# Patient Record
Sex: Male | Born: 1965 | Race: Black or African American | Hispanic: No | Marital: Single | Smoking: Never smoker
Health system: Southern US, Community
[De-identification: ages and names within clinical notes are randomized; demographics above are authoritative.]

## PROBLEM LIST (undated history)

## (undated) DIAGNOSIS — T7840XA Allergy, unspecified, initial encounter: Secondary | ICD-10-CM

## (undated) DIAGNOSIS — J45909 Unspecified asthma, uncomplicated: Secondary | ICD-10-CM

## (undated) DIAGNOSIS — I509 Heart failure, unspecified: Secondary | ICD-10-CM

## (undated) DIAGNOSIS — R571 Hypovolemic shock: Secondary | ICD-10-CM

## (undated) DIAGNOSIS — G4730 Sleep apnea, unspecified: Secondary | ICD-10-CM

## (undated) DIAGNOSIS — C801 Malignant (primary) neoplasm, unspecified: Secondary | ICD-10-CM

## (undated) DIAGNOSIS — D869 Sarcoidosis, unspecified: Secondary | ICD-10-CM

## (undated) DIAGNOSIS — E785 Hyperlipidemia, unspecified: Secondary | ICD-10-CM

## (undated) DIAGNOSIS — K219 Gastro-esophageal reflux disease without esophagitis: Secondary | ICD-10-CM

## (undated) DIAGNOSIS — K612 Anorectal abscess: Secondary | ICD-10-CM

## (undated) DIAGNOSIS — R159 Full incontinence of feces: Secondary | ICD-10-CM

## (undated) DIAGNOSIS — F79 Unspecified intellectual disabilities: Secondary | ICD-10-CM

## (undated) DIAGNOSIS — F209 Schizophrenia, unspecified: Secondary | ICD-10-CM

## (undated) DIAGNOSIS — R161 Splenomegaly, not elsewhere classified: Secondary | ICD-10-CM

## (undated) DIAGNOSIS — H269 Unspecified cataract: Secondary | ICD-10-CM

## (undated) DIAGNOSIS — K592 Neurogenic bowel, not elsewhere classified: Secondary | ICD-10-CM

## (undated) DIAGNOSIS — R6889 Other general symptoms and signs: Secondary | ICD-10-CM

## (undated) DIAGNOSIS — J439 Emphysema, unspecified: Secondary | ICD-10-CM

## (undated) DIAGNOSIS — K599 Functional intestinal disorder, unspecified: Secondary | ICD-10-CM

## (undated) DIAGNOSIS — K56609 Unspecified intestinal obstruction, unspecified as to partial versus complete obstruction: Secondary | ICD-10-CM

## (undated) HISTORY — DX: Allergy, unspecified, initial encounter: T78.40XA

## (undated) HISTORY — DX: Anorectal abscess: K61.2

## (undated) HISTORY — DX: Sarcoidosis, unspecified: D86.9

## (undated) HISTORY — PX: MOUTH SURGERY: SHX715

## (undated) HISTORY — DX: Full incontinence of feces: R15.9

## (undated) HISTORY — DX: Unspecified cataract: H26.9

## (undated) HISTORY — PX: OTHER SURGICAL HISTORY: SHX169

## (undated) HISTORY — DX: Gastro-esophageal reflux disease without esophagitis: K21.9

## (undated) HISTORY — DX: Functional intestinal disorder, unspecified: K59.9

## (undated) HISTORY — DX: Heart failure, unspecified: I50.9

## (undated) HISTORY — DX: Other general symptoms and signs: R68.89

## (undated) HISTORY — DX: Malignant (primary) neoplasm, unspecified: C80.1

## (undated) HISTORY — DX: Hyperlipidemia, unspecified: E78.5

## (undated) HISTORY — DX: Sleep apnea, unspecified: G47.30

## (undated) HISTORY — DX: Hypovolemic shock: R57.1

## (undated) HISTORY — DX: Splenomegaly, not elsewhere classified: R16.1

## (undated) HISTORY — DX: Unspecified asthma, uncomplicated: J45.909

## (undated) HISTORY — DX: Unspecified intellectual disabilities: F79

## (undated) HISTORY — DX: Schizophrenia, unspecified: F20.9

## (undated) HISTORY — DX: Emphysema, unspecified: J43.9

---

## 1998-04-09 ENCOUNTER — Encounter: Admission: RE | Admit: 1998-04-09 | Discharge: 1998-04-09 | Payer: Self-pay | Admitting: Internal Medicine

## 1998-09-29 ENCOUNTER — Encounter: Admission: RE | Admit: 1998-09-29 | Discharge: 1998-09-29 | Payer: Self-pay | Admitting: Internal Medicine

## 1998-10-27 ENCOUNTER — Encounter: Admission: RE | Admit: 1998-10-27 | Discharge: 1998-10-27 | Payer: Self-pay | Admitting: Internal Medicine

## 1998-11-02 ENCOUNTER — Encounter: Admission: RE | Admit: 1998-11-02 | Discharge: 1998-11-02 | Payer: Self-pay | Admitting: Internal Medicine

## 1999-09-01 ENCOUNTER — Encounter: Admission: RE | Admit: 1999-09-01 | Discharge: 1999-09-01 | Payer: Self-pay | Admitting: Internal Medicine

## 2000-09-24 ENCOUNTER — Encounter: Admission: RE | Admit: 2000-09-24 | Discharge: 2000-09-24 | Payer: Self-pay | Admitting: Internal Medicine

## 2001-11-11 ENCOUNTER — Encounter: Admission: RE | Admit: 2001-11-11 | Discharge: 2001-11-11 | Payer: Self-pay | Admitting: Internal Medicine

## 2001-12-20 ENCOUNTER — Encounter: Payer: Self-pay | Admitting: Urology

## 2001-12-21 ENCOUNTER — Inpatient Hospital Stay (HOSPITAL_COMMUNITY): Admission: EM | Admit: 2001-12-21 | Discharge: 2001-12-26 | Payer: Self-pay | Admitting: *Deleted

## 2001-12-23 ENCOUNTER — Encounter: Payer: Self-pay | Admitting: Urology

## 2002-10-23 ENCOUNTER — Encounter: Admission: RE | Admit: 2002-10-23 | Discharge: 2002-10-23 | Payer: Self-pay | Admitting: Internal Medicine

## 2002-12-18 ENCOUNTER — Encounter: Admission: RE | Admit: 2002-12-18 | Discharge: 2002-12-18 | Payer: Self-pay | Admitting: Internal Medicine

## 2003-01-02 ENCOUNTER — Encounter: Payer: Self-pay | Admitting: Cardiology

## 2003-01-02 ENCOUNTER — Ambulatory Visit (HOSPITAL_COMMUNITY): Admission: RE | Admit: 2003-01-02 | Discharge: 2003-01-02 | Payer: Self-pay | Admitting: Internal Medicine

## 2003-01-22 ENCOUNTER — Encounter: Admission: RE | Admit: 2003-01-22 | Discharge: 2003-01-22 | Payer: Self-pay | Admitting: Internal Medicine

## 2003-03-20 ENCOUNTER — Emergency Department (HOSPITAL_COMMUNITY): Admission: EM | Admit: 2003-03-20 | Discharge: 2003-03-20 | Payer: Self-pay | Admitting: *Deleted

## 2003-09-21 ENCOUNTER — Encounter: Admission: RE | Admit: 2003-09-21 | Discharge: 2003-09-21 | Payer: Self-pay | Admitting: Internal Medicine

## 2004-08-02 ENCOUNTER — Ambulatory Visit: Payer: Self-pay | Admitting: Internal Medicine

## 2004-11-25 ENCOUNTER — Ambulatory Visit: Payer: Self-pay | Admitting: Internal Medicine

## 2005-05-18 ENCOUNTER — Ambulatory Visit: Payer: Self-pay | Admitting: Internal Medicine

## 2005-07-31 HISTORY — PX: COLONOSCOPY: SHX174

## 2005-09-15 ENCOUNTER — Ambulatory Visit: Payer: Self-pay | Admitting: Internal Medicine

## 2005-12-14 ENCOUNTER — Ambulatory Visit: Payer: Self-pay | Admitting: Internal Medicine

## 2005-12-20 ENCOUNTER — Inpatient Hospital Stay (HOSPITAL_COMMUNITY): Admission: EM | Admit: 2005-12-20 | Discharge: 2006-01-01 | Payer: Self-pay | Admitting: Emergency Medicine

## 2005-12-20 ENCOUNTER — Ambulatory Visit: Payer: Self-pay | Admitting: Internal Medicine

## 2005-12-23 ENCOUNTER — Encounter (INDEPENDENT_AMBULATORY_CARE_PROVIDER_SITE_OTHER): Payer: Self-pay | Admitting: *Deleted

## 2005-12-26 ENCOUNTER — Ambulatory Visit: Payer: Self-pay | Admitting: Internal Medicine

## 2006-01-01 ENCOUNTER — Encounter (INDEPENDENT_AMBULATORY_CARE_PROVIDER_SITE_OTHER): Payer: Self-pay | Admitting: *Deleted

## 2006-01-01 ENCOUNTER — Encounter: Payer: Self-pay | Admitting: Internal Medicine

## 2006-01-01 LAB — HM COLONOSCOPY

## 2006-01-02 ENCOUNTER — Emergency Department (HOSPITAL_COMMUNITY): Admission: EM | Admit: 2006-01-02 | Discharge: 2006-01-02 | Payer: Self-pay | Admitting: Emergency Medicine

## 2006-01-11 ENCOUNTER — Ambulatory Visit: Payer: Self-pay | Admitting: Internal Medicine

## 2006-02-13 ENCOUNTER — Ambulatory Visit: Payer: Self-pay | Admitting: Internal Medicine

## 2006-03-23 ENCOUNTER — Encounter (INDEPENDENT_AMBULATORY_CARE_PROVIDER_SITE_OTHER): Payer: Self-pay | Admitting: *Deleted

## 2006-03-23 ENCOUNTER — Ambulatory Visit (HOSPITAL_COMMUNITY): Admission: RE | Admit: 2006-03-23 | Discharge: 2006-03-23 | Payer: Self-pay | Admitting: Internal Medicine

## 2006-04-09 ENCOUNTER — Ambulatory Visit: Payer: Self-pay | Admitting: Internal Medicine

## 2006-05-10 ENCOUNTER — Encounter (INDEPENDENT_AMBULATORY_CARE_PROVIDER_SITE_OTHER): Payer: Self-pay | Admitting: Infectious Diseases

## 2006-05-25 ENCOUNTER — Ambulatory Visit: Payer: Self-pay | Admitting: Internal Medicine

## 2006-05-25 ENCOUNTER — Encounter (INDEPENDENT_AMBULATORY_CARE_PROVIDER_SITE_OTHER): Payer: Self-pay | Admitting: Infectious Diseases

## 2006-06-11 DIAGNOSIS — F79 Unspecified intellectual disabilities: Secondary | ICD-10-CM

## 2006-06-11 DIAGNOSIS — I5022 Chronic systolic (congestive) heart failure: Secondary | ICD-10-CM

## 2006-06-11 DIAGNOSIS — F209 Schizophrenia, unspecified: Secondary | ICD-10-CM | POA: Insufficient documentation

## 2006-06-11 DIAGNOSIS — R161 Splenomegaly, not elsewhere classified: Secondary | ICD-10-CM

## 2006-06-11 DIAGNOSIS — D869 Sarcoidosis, unspecified: Secondary | ICD-10-CM

## 2006-06-11 DIAGNOSIS — K592 Neurogenic bowel, not elsewhere classified: Secondary | ICD-10-CM | POA: Insufficient documentation

## 2006-06-11 HISTORY — DX: Neurogenic bowel, not elsewhere classified: K59.2

## 2006-06-25 ENCOUNTER — Encounter: Admission: RE | Admit: 2006-06-25 | Discharge: 2006-09-23 | Payer: Self-pay | Admitting: Internal Medicine

## 2006-08-18 DIAGNOSIS — K612 Anorectal abscess: Secondary | ICD-10-CM

## 2006-08-18 DIAGNOSIS — G4733 Obstructive sleep apnea (adult) (pediatric): Secondary | ICD-10-CM

## 2006-08-18 DIAGNOSIS — I10 Essential (primary) hypertension: Secondary | ICD-10-CM | POA: Insufficient documentation

## 2006-08-18 HISTORY — DX: Anorectal abscess: K61.2

## 2006-08-27 ENCOUNTER — Ambulatory Visit: Payer: Self-pay | Admitting: Internal Medicine

## 2006-10-10 ENCOUNTER — Ambulatory Visit: Payer: Self-pay | Admitting: Internal Medicine

## 2006-10-10 ENCOUNTER — Encounter (INDEPENDENT_AMBULATORY_CARE_PROVIDER_SITE_OTHER): Payer: Self-pay | Admitting: Infectious Diseases

## 2006-10-10 DIAGNOSIS — H409 Unspecified glaucoma: Secondary | ICD-10-CM

## 2007-02-20 ENCOUNTER — Ambulatory Visit: Payer: Self-pay | Admitting: Internal Medicine

## 2007-05-27 ENCOUNTER — Ambulatory Visit: Payer: Self-pay | Admitting: Infectious Diseases

## 2007-10-11 ENCOUNTER — Ambulatory Visit: Payer: Self-pay | Admitting: *Deleted

## 2007-10-14 ENCOUNTER — Ambulatory Visit: Payer: Self-pay | Admitting: Internal Medicine

## 2007-10-14 LAB — CONVERTED CEMR LAB
ALT: 26 units/L (ref 0–53)
BUN: 11 mg/dL (ref 6–23)
Basophils Absolute: 0 10*3/uL (ref 0.0–0.1)
Bilirubin, Direct: 0.2 mg/dL (ref 0.0–0.3)
Eosinophils Absolute: 0 10*3/uL (ref 0.0–0.6)
Eosinophils Relative: 0.8 % (ref 0.0–5.0)
GFR calc Af Amer: 95 mL/min
Glucose, Bld: 72 mg/dL (ref 70–99)
Hemoglobin: 15.6 g/dL (ref 13.0–17.0)
Lymphocytes Relative: 28 % (ref 12.0–46.0)
MCV: 91.6 fL (ref 78.0–100.0)
Monocytes Absolute: 0.5 10*3/uL (ref 0.2–0.7)
Neutro Abs: 2.7 10*3/uL (ref 1.4–7.7)
Neutrophils Relative %: 58.3 % (ref 43.0–77.0)
RDW: 13.5 % (ref 11.5–14.6)
Total Protein: 7.3 g/dL (ref 6.0–8.3)
WBC: 4.4 10*3/uL — ABNORMAL LOW (ref 4.5–10.5)

## 2008-09-16 ENCOUNTER — Ambulatory Visit: Payer: Self-pay | Admitting: Internal Medicine

## 2008-09-16 ENCOUNTER — Encounter: Payer: Self-pay | Admitting: Internal Medicine

## 2008-09-17 DIAGNOSIS — E785 Hyperlipidemia, unspecified: Secondary | ICD-10-CM

## 2008-09-17 LAB — CONVERTED CEMR LAB
ALT: 33 units/L (ref 0–53)
Albumin: 4.4 g/dL (ref 3.5–5.2)
BUN: 10 mg/dL (ref 6–23)
CO2: 25 meq/L (ref 19–32)
Calcium: 9.8 mg/dL (ref 8.4–10.5)
Creatinine, Ser: 1.06 mg/dL (ref 0.40–1.50)
Glucose, Bld: 78 mg/dL (ref 70–99)
Hemoglobin: 14.6 g/dL (ref 13.0–17.0)
LDL Cholesterol: 141 mg/dL — ABNORMAL HIGH (ref 0–99)
MCV: 89.4 fL (ref 78.0–100.0)
Potassium: 4.1 meq/L (ref 3.5–5.3)
Sodium: 141 meq/L (ref 135–145)
Total CHOL/HDL Ratio: 4.7
Triglycerides: 85 mg/dL (ref <150)
VLDL: 17 mg/dL (ref 0–40)

## 2008-10-01 DIAGNOSIS — K5649 Other impaction of intestine: Secondary | ICD-10-CM | POA: Insufficient documentation

## 2008-11-11 ENCOUNTER — Ambulatory Visit: Payer: Self-pay | Admitting: Internal Medicine

## 2008-11-11 ENCOUNTER — Ambulatory Visit (HOSPITAL_COMMUNITY): Admission: RE | Admit: 2008-11-11 | Discharge: 2008-11-11 | Payer: Self-pay | Admitting: Internal Medicine

## 2008-11-16 ENCOUNTER — Encounter: Payer: Self-pay | Admitting: Internal Medicine

## 2008-11-17 ENCOUNTER — Ambulatory Visit (HOSPITAL_COMMUNITY)
Admission: RE | Admit: 2008-11-17 | Discharge: 2008-11-17 | Payer: Self-pay | Admitting: Physical Medicine and Rehabilitation

## 2008-11-23 ENCOUNTER — Ambulatory Visit: Payer: Self-pay | Admitting: Internal Medicine

## 2008-11-23 ENCOUNTER — Ambulatory Visit (HOSPITAL_COMMUNITY): Admission: RE | Admit: 2008-11-23 | Discharge: 2008-11-23 | Payer: Self-pay | Admitting: Internal Medicine

## 2008-11-23 LAB — CONVERTED CEMR LAB
BUN: 14 mg/dL (ref 6–23)
Calcium: 9.4 mg/dL (ref 8.4–10.5)
Sodium: 140 meq/L (ref 135–145)

## 2008-12-01 ENCOUNTER — Ambulatory Visit (HOSPITAL_COMMUNITY): Admission: RE | Admit: 2008-12-01 | Discharge: 2008-12-01 | Payer: Self-pay | Admitting: Internal Medicine

## 2009-02-17 ENCOUNTER — Telehealth: Payer: Self-pay | Admitting: Internal Medicine

## 2009-05-06 ENCOUNTER — Telehealth: Payer: Self-pay | Admitting: Internal Medicine

## 2009-10-01 ENCOUNTER — Ambulatory Visit: Payer: Self-pay | Admitting: Internal Medicine

## 2009-10-06 ENCOUNTER — Ambulatory Visit: Payer: Self-pay | Admitting: Internal Medicine

## 2009-10-06 LAB — CONVERTED CEMR LAB
Albumin: 4.1 g/dL (ref 3.5–5.2)
Basophils Absolute: 0 10*3/uL (ref 0.0–0.1)
CO2: 29 meq/L (ref 19–32)
Calcium: 9.3 mg/dL (ref 8.4–10.5)
GFR calc non Af Amer: 104.82 mL/min (ref >60)
HCT: 44.4 % (ref 39.0–52.0)
Hemoglobin: 14.2 g/dL (ref 13.0–17.0)
Lymphs Abs: 1 10*3/uL (ref 0.7–4.0)
MCHC: 32.1 g/dL (ref 30.0–36.0)
Phosphorus: 2.8 mg/dL (ref 2.3–4.6)
Platelets: 160 10*3/uL (ref 150.0–400.0)
RBC: 4.68 M/uL (ref 4.22–5.81)
RDW: 13.4 % (ref 11.5–14.6)

## 2009-11-19 ENCOUNTER — Encounter: Payer: Self-pay | Admitting: Internal Medicine

## 2009-12-07 ENCOUNTER — Encounter: Payer: Self-pay | Admitting: Internal Medicine

## 2010-06-07 ENCOUNTER — Telehealth: Payer: Self-pay | Admitting: Internal Medicine

## 2010-07-31 HISTORY — PX: COLONOSCOPY: SHX174

## 2010-08-08 ENCOUNTER — Ambulatory Visit
Admission: RE | Admit: 2010-08-08 | Discharge: 2010-08-08 | Payer: Self-pay | Source: Home / Self Care | Attending: Internal Medicine | Admitting: Internal Medicine

## 2010-08-08 ENCOUNTER — Other Ambulatory Visit: Payer: Self-pay | Admitting: Internal Medicine

## 2010-08-08 LAB — COMPREHENSIVE METABOLIC PANEL
ALT: 59 U/L — ABNORMAL HIGH (ref 0–53)
AST: 32 U/L (ref 0–37)
Albumin: 4 g/dL (ref 3.5–5.2)
Alkaline Phosphatase: 98 U/L (ref 39–117)
BUN: 14 mg/dL (ref 6–23)
CO2: 28 mEq/L (ref 19–32)
Calcium: 9.4 mg/dL (ref 8.4–10.5)
Chloride: 108 mEq/L (ref 96–112)
Creatinine, Ser: 1 mg/dL (ref 0.4–1.5)
GFR: 105.63 mL/min (ref >60.00)
Glucose, Bld: 120 mg/dL — ABNORMAL HIGH (ref 70–99)
Potassium: 4 mEq/L (ref 3.5–5.1)
Sodium: 141 mEq/L (ref 135–145)
Total Bilirubin: 1 mg/dL (ref 0.3–1.2)
Total Protein: 6.9 g/dL (ref 6.0–8.3)

## 2010-08-08 LAB — MAGNESIUM: Magnesium: 2.1 mg/dL (ref 1.5–2.5)

## 2010-08-11 ENCOUNTER — Other Ambulatory Visit: Payer: Self-pay | Admitting: Internal Medicine

## 2010-08-11 ENCOUNTER — Ambulatory Visit: Admission: RE | Admit: 2010-08-11 | Discharge: 2010-08-11 | Payer: Self-pay | Source: Home / Self Care

## 2010-08-11 ENCOUNTER — Ambulatory Visit
Admission: RE | Admit: 2010-08-11 | Discharge: 2010-08-11 | Payer: Self-pay | Source: Home / Self Care | Attending: Internal Medicine | Admitting: Internal Medicine

## 2010-08-11 LAB — HEMOGLOBIN A1C: Hgb A1c MFr Bld: 5.8 % (ref 4.6–6.5)

## 2010-09-01 NOTE — Assessment & Plan Note (Signed)
Summary: CHECKUP/SB.   Vital Signs:  Patient profile:   45 year old male Height:      76 inches (193.04 cm) Weight:      212.2 pounds (96.45 kg) BMI:     25.92 Temp:     97.3 degrees F (36.28 degrees C) oral Pulse rate:   99 / minute BP sitting:   125 / 77  (right arm)  Vitals Entered By: Stanton Kidney Ditzler RN (October 01, 2009 2:57 PM) Is Patient Diabetic? No Pain Assessment Patient in pain? no      Nutritional Status BMI of 25 - 29 = overweight Nutritional Status Detail appetite good  Have you ever been in a relationship where you felt threatened, hurt or afraid?denies   Does patient need assistance? Functional Status Self care Ambulation Normal Comments Care giver with pt. Ck-up.   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 Pt has no complain. his caregiver reports no specific complain. He is eating and doing well. he exercises regularly without any chest pain or sob. His mental function is stable and has not declined.   Depression History:      The patient denies a depressed mood most of the day and a diminished interest in his usual daily activities.         Preventive Screening-Counseling & Management  Alcohol-Tobacco     Smoking Status: never     Passive Smoke Exposure: no  Caffeine-Diet-Exercise     Does Patient Exercise: yes     Type of exercise: walking     Times/week: 7  Current Medications (verified): 1)  Citrate of  Magnesia 1.745 Gm/28ml Soln (Magnesium Citrate) .... Take As Directed. 2)  Metamucil  Powd (Psyllium Powd) .... Take One Tsp. Two Times A Day 3)  Patanol 0.1 % Soln (Olopatadine Hcl) .... One Drop in Each Eye Twice  Daily 4)  Travatan  Soln (Travoprost Soln) .... One Drop in Each Eye Once Daily 5)  Multivitamins  Caps (Multiple Vitamin) .... Take 1 Tablet By Mouth Once A Day 6)  Clozaril 100 Mg Tabs (Clozapine) .Marland Kitchen.. 100 Mg Twice Daily and 25 Mg Once Daily 7)  Fluvoxamine Maleate 100 Mg Tabs (Fluvoxamine Maleate) .... Take 3 Tablet By Mouth Once A Day 8)  Miralax   Powd (Polyethylene Glycol 3350) .... Dissolve 1 1/2 Capful in 8 Ounces Water or Juice Daily and Drink 9)  Milk of Magnesia 2400 Mg/13ml Susp (Magnesium Hydroxide) .... Take As Directed By Physician. 10)  Pravachol 40 Mg Tabs (Pravastatin Sodium) .... Take 1 Tablet By Mouth Once A Day 11)  Dulcolax 5 Mg Tbec (Bisacodyl) .... Take As Directed By Physician.  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  Alcohol Use - no  Risk Factors: Exercise: yes (10/01/2009)  Risk Factors: Smoking Status: never (10/01/2009) Passive Smoke Exposure: no  (10/01/2009)  Review of Systems      See HPI  Physical Exam  General:  alert, oriented and very cooperative. He answers simple questions. Head:  normocephalic.   Eyes:  PERRLA, no icterus. Mouth:  Oral mucosa and oropharynx without lesions or exudates.  Teeth in good repair. Neck:  Supple; no masses or thyromegaly. Lungs:  Clear throughout to auscultation. Heart:  Regular rate and rhythm; no murmurs, rubs or bruits. Abdomen:  mildly protuberant abdomen with normal active bowel sounds . Right upper quadrant and lower abdomen is unremarkable. There is no ascites. Msk:  normal ROM, no joint tenderness, and no joint swelling.   Neurologic:  No cranial nerve deficits noted. Station and gait are normal. Sensory, motor and coordinative functions appear intact and appropriate for him.   Impression & Recommendations:  Problem # 1:  HYPERLIPIDEMIA (ICD-272.4) stable. No changes made.  His updated medication list for this problem includes:    Pravachol 40 Mg Tabs (Pravastatin sodium) .Marland Kitchen... Take 1 tablet by mouth once a day  Labs Reviewed: SGOT: 18 (09/16/2008)   SGPT: 33 (09/16/2008)   HDL:43 (09/16/2008)  LDL:141 (09/16/2008)  Chol:201 (09/16/2008)  Trig:85 (09/16/2008)  Problem # 2:  HYPERTENSION (ICD-401.9) adquete control. No changes made.  BP today: 125/77 Prior BP: 142/82 (11/11/2008)  Labs Reviewed: K+: 4.0 (11/23/2008) Creat: : 1.1 (11/23/2008)   Chol: 201 (09/16/2008)   HDL: 43 (09/16/2008)   LDL: 141 (09/16/2008)   TG: 85 (09/16/2008)  Problem # 3:  CONSTIPATION (ICD-564.00) Managed by dr. Juanda Chance. Caregiver reports adquete relief with this bowel regimen.  His updated medication list for this problem includes:    Citrate of Magnesia 1.745 Gm/42ml Soln (Magnesium citrate) .Marland Kitchen... Take as directed.    Metamucil Powd (Psyllium powd) .Marland Kitchen... Take one tsp. two times a day    Miralax Powd (Polyethylene glycol 3350) .Marland Kitchen... Dissolve 1 1/2 capful in 8 ounces water or juice daily and  drink    Milk of Magnesia 2400 Mg/82ml Susp (Magnesium hydroxide) .Marland Kitchen... Take as directed by physician.    Dulcolax 5 Mg Tbec (Bisacodyl) .Marland Kitchen... Take as directed by physician.  Complete Medication List: 1)  Citrate of Magnesia 1.745 Gm/34ml Soln (Magnesium citrate) .... Take as directed. 2)  Metamucil Powd (Psyllium powd) .... Take one tsp. two times a day 3)  Patanol 0.1 % Soln (Olopatadine hcl) .... One drop in each eye twice  daily 4)  Travatan Soln (Travoprost soln) .... One drop in each eye once daily 5)  Multivitamins Caps (Multiple vitamin) .... Take 1 tablet by mouth once a day 6)  Clozaril 100 Mg Tabs (Clozapine) .Marland Kitchen.. 100 mg twice daily and 25 mg once daily 7)  Fluvoxamine Maleate 100 Mg Tabs (Fluvoxamine maleate) .... Take 3 tablet by mouth once a day 8)  Miralax Powd (Polyethylene glycol 3350) .... Dissolve 1 1/2 capful in 8 ounces water or juice daily and drink 9)  Milk of Magnesia 2400 Mg/5ml Susp (Magnesium hydroxide) .... Take as directed by physician. 10)  Pravachol 40 Mg Tabs (Pravastatin sodium) .... Take 1 tablet by mouth once a day 11)  Dulcolax 5 Mg Tbec (Bisacodyl) .... Take as directed by physician.  Other Orders: Influenza Vaccine NON MCR (16109)  Patient Instructions: 1)  Please  schedule a follow-up appointment in 6 months. Prescriptions: PRAVACHOL 40 MG TABS (PRAVASTATIN SODIUM) Take 1 tablet by mouth once a day  #30 x 6   Entered and Authorized by:   Clerance Lav MD   Signed by:   Clerance Lav MD on 10/01/2009   Method used:   Print then Give to Patient   RxID:   [id number] MULTIVITAMINS  CAPS (MULTIPLE VITAMIN) Take 1 tablet by mouth once a day  #30 x 12   Entered and Authorized by:   Clerance Lav MD   Signed by:   Clerance Lav MD on 10/01/2009   Method used:   Print then Give to Patient   RxID:   [id number]    Prevention & Chronic Care Immunizations   Influenza vaccine: Fluvax Non-MCR  (10/01/2009)    Tetanus booster: Not  documented    Pneumococcal vaccine: Not documented  Other Screening   Smoking status: never  (10/01/2009)  Lipids   Total Cholesterol: 201  (09/16/2008)   LDL: 141  (09/16/2008)   LDL Direct: Not documented   HDL: 43  (09/16/2008)   Triglycerides: 85  (09/16/2008)    SGOT (AST): 18  (09/16/2008)   SGPT (ALT): 33  (09/16/2008)   Alkaline phosphatase: 106  (09/16/2008)   Total bilirubin: 0.7  (09/16/2008)    Lipid flowsheet reviewed?: Yes   Progress toward LDL goal: Unchanged  Hypertension   Last Blood Pressure: 125 / 77  (10/01/2009)   Serum creatinine: 1.1  (11/23/2008)   Serum potassium 4.0  (11/23/2008)    Hypertension flowsheet reviewed?: Yes   Progress toward BP goal: At goal  Self-Management Support :   Personal Goals (by the next clinic visit) :      Personal blood pressure goal: 140/90  (10/01/2009)     Personal LDL goal: 130  (10/01/2009)    Patient will work on the following items until the next clinic visit to reach self-care goals:     Medications and monitoring: take my medicines every day, bring all of my medications to every visit, weigh myself weekly  (10/01/2009)     Eating: drink diet soda or water instead of juice or soda, eat more vegetables, use fresh or frozen vegetables, eat foods that are low in salt, eat baked foods instead of fried foods, eat fruit for snacks and desserts, limit or avoid alcohol  (10/01/2009)     Activity: take a 30 minute walk every day, park at the far end of the parking lot  (10/01/2009)    Hypertension self-management support: Written self-care plan, Education handout, Resources for patients handout  (10/01/2009)   Hypertension self-care plan printed.   Hypertension education handout printed    Lipid self-management support: Education handout, Written self-care plan, Resources for patients handout  (10/01/2009)   Lipid self-care plan printed.   Lipid education handout printed      Resource handout printed.   Nursing  Instructions: Give Flu vaccine today    Influenza Vaccine    Vaccine Type: Fluvax Non-MCR    Site: right deltoid    Mfr: novartis    Dose: 0.5 ml    Route: IM    Given by: Stanton Kidney Ditzler RN    Exp. Date: 10/29/2009    Lot #: [id number] P    VIS given: 02/21/07 version given October 01, 2009.  Flu Vaccine Consent Questions    Do you have a history of severe allergic reactions to this vaccine? no    Any prior  history of allergic reactions to egg and/or gelatin? no    Do you have a sensitivity to the preservative Thimersol? no    Do you have a past history of Guillan-Barre Syndrome? no    Do you currently have an acute febrile illness? no    Have you ever had a severe reaction to latex? no    Vaccine information given and explained to patient? yes   Appended Document: CHECKUP/SB.    Clinical Lists Changes  Orders: Added new Service order of Est. Patient Level III (04540) - Signed

## 2010-09-01 NOTE — Miscellaneous (Signed)
[facility]   Imported By: Margie Billet 12/06/2009 12:03:48  _____________________________________________________________________  External Attachment:    Type:   Image     Comment:   External Document

## 2010-09-01 NOTE — Assessment & Plan Note (Signed)
Summary: YEARLY F-UP/YF   History of Present Illness Visit Type: Follow-up Visit Primary GI MD: Lina Sar MD Primary Provider: Riddhish Shah,MD Requesting Provider: n/a Chief Complaint: Patient comes for routine yearly f/u. He denies any GI complaints with the exception of constipation (which they currently control with miralax) History of Present Illness:   This is a 45 year old adult [place] American male with colonic inertia and chronic constipation. He comes today for a followup appointment. He was hospitalized in 2007 for a fecal impaction. His last colonoscopy in June 2007 was incomplete due to a redundant and dilated colon. He had several ulcers due to impaction. He is doing well and has no complaints today.   GI Review of Systems      Denies abdominal pain, acid reflux, belching, bloating, chest pain, dysphagia with liquids, dysphagia with solids, heartburn, loss of appetite, nausea, vomiting, vomiting blood, weight loss, and  weight gain.      Reports constipation.     Denies anal fissure, black tarry stools, change in bowel habit, diarrhea, diverticulosis, fecal incontinence, heme positive stool, hemorrhoids, irritable bowel syndrome, jaundice, light color stool, liver problems, rectal bleeding, and  rectal pain.    Current Medications (verified): 1)  Citrate of Magnesia 1.745 Gm/56ml Soln (Magnesium Citrate) .... Take As Directed. 2)  Metamucil  Powd (Psyllium Powd) .... Take One Tsp. Two Times A Day 3)  Travatan  Soln (Travoprost Soln) .... One Drop in Each Eye Once Daily 4)  Multivitamins  Caps (Multiple Vitamin) .... Take 1 Tablet By Mouth Once A Day 5)  Clozaril 100 Mg Tabs (Clozapine) .Marland Kitchen.. 100 Mg Twice Daily and 25 Mg Once Daily 6)  Fluvoxamine Maleate 100 Mg Tabs (Fluvoxamine Maleate) .... Take 3 Tablet By Mouth Once A Day 7)  Miralax   Powd (Polyethylene Glycol 3350) .... Dissolve 1 1/2 Capful in 8 Ounces Water or Juice Daily and Drink 8)  Milk of Magnesia 2400  Mg/63ml Susp (Magnesium Hydroxide) .... Take As Directed By Physician. 9)  Pravachol 40 Mg Tabs (Pravastatin Sodium) .... Take 1 Tablet By Mouth Once A Day 10)  Dulcolax 5 Mg Tbec (Bisacodyl) .... Take As Directed By Physician.  Allergies (verified): No Known Drug Allergies  Past History:  Past Medical History: Reviewed history from 10/01/2008 and no changes required. 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 Illicit Drug Use - no Has a caretaker Patient has never smoked.  Alcohol Use - no  Review of Systems  The patient denies allergy/sinus, anemia, anxiety-new, arthritis/joint pain, back pain, blood in urine, breast changes/lumps, change in vision, confusion, cough, coughing up blood, depression-new, fainting, fatigue, fever, headaches-new, hearing problems, heart murmur, heart rhythm changes, itching, menstrual pain, muscle pains/cramps, night sweats, nosebleeds, pregnancy symptoms, shortness of breath, skin rash, sleeping problems, sore throat, swelling of feet/legs, swollen lymph glands, thirst - excessive , urination - excessive , urination changes/pain, urine leakage, vision changes, and voice change.         Pertinent positive and negative review of systems were noted in the above HPI. All other ROS was otherwise negative.   Vital Signs:  Patient profile:   45 year old male Height:  76 inches Weight:      213 pounds BMI:     26.02 BSA:      2.28 Pulse rate:   116 / minute Pulse rhythm:   regular BP sitting:   120 / 80  (left arm)  Vitals Entered By: Hortense Ramal CMA Duncan Dull) (October 06, 2009 8:12 AM)  Physical Exam  General:  Well developed, well nourished, no acute distress. Mouth:  No deformity or lesions, dentition normal. Lungs:  Clear throughout to auscultation. Abdomen:  soft protuberant abdomen with normoactive bowel sounds. No tenderness. No tympany. Rectal:  large amount of fecal impaction which is Hemoccult-negative. Extremities:  No clubbing, cyanosis, edema or deformities noted. Skin:  Intact without significant lesions or rashes. Psych:  Alert and cooperative. Normal mood and affect.   Impression & Recommendations:  Problem # 1:  * COLONIC INERTIA Patient has an impaction again today. We will increase his magnesium citrate to one bottle twice a week and we will increase his milk of magnesia to 3 tablespoons twice a day. We will continue him on Metamucil 1 tablespoon twice a day and MiraLax 1-1/2 capful twice a day. He will stay on dulcolax 2 tablets in the morning. We are checking his renal profile today.  Problem # 2:  Hx of ABSCESS, ANAL/RECTAL REGIONS (ICD-566) not an active problem.  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  Other Orders: TLB-Renal Function Panel (80069-RENAL) TLB-CBC Platelet - w/Differential (85025-CBCD)  Patient Instructions: 1)  Take Magnesium Citrate 1 bottle twice per week on Mondays, Thursday. 2)  Take Metamucil 1 tablespoon by mouth two times a day 3)  Take Miralax 1 1/2 capful dissolved in water/juice two times a day  4)  Take Milk of Magnesia 3 tablespoons two times a day. 5)  Take Dulcolax 2 tablets every morning. 6)  Go to the Basement floor to have your renal profile and CBC-Diff. 7)  Copy sent to : Riddhish Shah,MD 8)  The medication list was reviewed and reconciled.  All changed / newly prescribed medications were explained.  A complete medication list  was provided to the patient / caregiver. Prescriptions: MILK OF MAGNESIA 2400 MG/10ML SUSP (MAGNESIUM HYDROXIDE) Take 3 tablespoons by mouth two times a day  #1 bottle x 6   Entered by:   Hortense Ramal CMA (AAMA)   Authorized by:   Hart Carwin MD   Signed by:   Hortense Ramal CMA (AAMA) on 10/06/2009   Method used:   Print then Give to Patient   RxID:   [id number] CITRATE OF MAGNESIA 1.745 GM/30ML SOLN (MAGNESIUM CITRATE) Take 1 bottle two times per week (on Thursdays and Mondays)  #8 bottles x 6   Entered by:   Hortense Ramal CMA (AAMA)   Authorized by:   Hart Carwin MD   Signed by:   Hortense Ramal CMA (AAMA) on 10/06/2009   Method used:   Print then Give to Patient   RxID:   [id number] METAMUCIL  POWD (PSYLLIUM POWD) Take 1 tablespoon by mouth two times a day  #1 bottle x 6   Entered by:   Hortense Ramal CMA (AAMA)   Authorized by:   Hart Carwin MD   Signed by:   Hortense Ramal CMA (AAMA) on 10/06/2009   Method used:   Print then Give to Patient   RxID:   [id number] MIRALAX   POWD (POLYETHYLENE GLYCOL 3350) Dissolve 1 1/2 capful in 8 ounces water or juice and drink two  times a day  #527 grams x 6   Entered by:   Hortense Ramal CMA (AAMA)   Authorized by:   Hart Carwin MD   Signed by:   Hortense Ramal CMA (AAMA) on 10/06/2009   Method used:   Print then Give to Patient   RxID:   [id number] MILK OF MAGNESIA 2400 MG/10ML SUSP (MAGNESIUM HYDROXIDE) Take 1 bottle twice per week  #8 bottles x 6   Entered by:   Hortense Ramal CMA (AAMA)   Authorized by:   Hart Carwin MD   Signed by:   Hortense Ramal CMA (AAMA) on 10/06/2009   Method used:   Print then Give to Patient   RxID:   [id number] DULCOLAX 5 MG TBEC (BISACODYL) Take 2 tablets by mouth every morning.  #60 x 6   Entered by:   Hortense Ramal CMA (AAMA)   Authorized by:   Hart Carwin MD   Signed by:   Hortense Ramal CMA (AAMA) on 10/06/2009   Method used:   Print then Give to Patient   RxID:    [id number]  Of Note: Milk Of Magnesia 1 bottle twice per week was voided and prescription was corrected to read take 3 tablespoons by mouth two times a day. Hortense Ramal CMA Duncan Dull)  October 06, 2009 8:47 AM

## 2010-09-01 NOTE — Assessment & Plan Note (Signed)
Summary: NEED MEDICATION./SB.   Vital Signs:  Patient profile:   45 year old male Height:      76 inches (193.04 cm) Weight:      201.7 pounds (91.68 kg) BMI:     24.64 Temp:     97.5 degrees F (36.39 degrees C) oral Pulse rate:   84 / minute BP sitting:   117 / 66  (left arm)  Vitals Entered By: Stanton Kidney Ditzler RN (August 11, 2010 10:03 AM) Is Patient Diabetic? No Pain Assessment Patient in pain? no      Nutritional Status BMI of 19 -24 = normal Nutritional Status Detail appetite good  Have you ever been in a relationship where you felt threatened, hurt or afraid?denies   Does patient need assistance? Functional Status Self care Ambulation Normal Comments Care giver with pt. Refills on meds.   Primary Care Provider:  Riddhish Shah,MD   History of Present Illness: 45yo M with mental retardation, chronic constipation, and HL presents for routine follow-up. He is accompanied by a caretaker. He has no physical complaints at present and says that he feels well. He needs refills for pravastatin and multivitamin.   Depression History:      The patient denies a depressed mood most of the day and a diminished interest in his usual daily activities.         Preventive Screening-Counseling & Management  Alcohol-Tobacco     Smoking Status: never     Passive Smoke Exposure: no  Caffeine-Diet-Exercise     Does Patient Exercise: yes     Type of exercise: walking     Times/week: 7  Current Medications (verified): 1)  Citrate of Magnesia 1.745 Gm/19ml Soln (Magnesium Citrate) .... Take 1 Bottle Two Times Per Week (On Thursdays and Mondays) 2)  Metamucil  Powd (Psyllium Powd) .... Take 1 Tablespoon By Mouth Two Times A Day 3)  Travatan  Soln (Travoprost Soln) .... One Drop in Each Eye Once Daily 4)  Multivitamins  Caps (Multiple Vitamin) .... Take 1 Tablet By Mouth Once A Day 5)  Clozaril 100 Mg Tabs (Clozapine) .Marland Kitchen.. 100 Mg Twice Daily and 25 Mg Once Daily 6)  Fluvoxamine  Maleate 100 Mg Tabs (Fluvoxamine Maleate) .... Take 3 Tablet By Mouth Once A Day 7)  Miralax   Powd (Polyethylene Glycol 3350) .... Dissolve 1 1/2 Capful in 8 Ounces Water or Juice and Drink Two Times A Day 8)  Milk of Magnesia 2400 Mg/32ml Susp (Magnesium Hydroxide) .... Take 3 Tablespoons By Mouth Two Times A Day 9)  Pravachol 40 Mg Tabs (Pravastatin Sodium) .... Take 1 Tablet By Mouth Once A Day 10)  Dulcolax 5 Mg Tbec (Bisacodyl) .... Take 2 Tablets By Mouth Every Morning.  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  Alcohol Use - no  Review of Systems      See HPI General:  Denies chills, fever, and malaise. CV:  Denies chest pain or discomfort. Resp:  Denies cough and shortness of  breath. GI:  Denies abdominal pain and change in bowel habits.  Physical Exam  General:  alert and cooperative to examination.   Head:  normocephalic and atraumatic.   Eyes:  vision grossly intact, pupils equal, pupils round, and pupils reactive to light.   Ears:  R ear normal and L ear normal.   Mouth:  pharynx pink and moist.   Neck:  supple and no masses.   Lungs:  normal respiratory effort, normal breath sounds, no crackles, and no wheezes.   Heart:  normal rate, regular  rhythm, no murmur, no gallop, and no rub.   Abdomen:  soft, non-tender, and normal bowel sounds.   Extremities:  No edema.  Neurologic:  alert & oriented X3, cranial nerves grossly intact, strength normal in all extremities, and sensation intact to light touch.   Skin:  turgor normal and no rashes.   Psych:  Oriented X3, not anxious appearing, and not depressed appearing.     Impression & Recommendations:  Problem # 1:  HYPERLIPIDEMIA (ICD-272.4) Stable. Pravastatin refilled. Awaiting results of fasting labs obtained earlier this morning at Dr. Regino Schultze office.   His updated medication list for this problem includes:    Pravachol 40 Mg Tabs (Pravastatin sodium) .Marland Kitchen... Take 1 tablet by mouth once a day  Problem # 2:  CONSTIPATION (ICD-564.00) Stable on an aggressive laxative regimen. Managed by Dr. Juanda Chance.   His updated medication list for this problem includes:    Citrate of Magnesia 1.745 Gm/73ml Soln (Magnesium citrate) .Marland Kitchen... Take 1 bottle two times per week (on thursdays and mondays)    Metamucil Powd (Psyllium powd) .Marland Kitchen... Take 1 tablespoon by mouth two times a day    Miralax Powd (Polyethylene glycol 3350) .Marland Kitchen... Dissolve 1 1/2 capful in 8 ounces water or juice and drink two times a day    Milk of Magnesia 2400 Mg/3ml Susp (Magnesium hydroxide) .Marland Kitchen... Take 3 tablespoons by mouth two times a day    Dulcolax 5 Mg Tbec (Bisacodyl) .Marland Kitchen... Take 2 tablets by mouth every morning.  Problem # 3:  Preventive Health Care (ICD-V70.0) Flu vaccine administered.   Complete Medication List: 1)  Citrate of Magnesia 1.745 Gm/67ml Soln (Magnesium citrate) .... Take 1 bottle two times per week (on thursdays and mondays) 2)  Metamucil Powd (Psyllium powd) .... Take 1 tablespoon by mouth two times a day 3)  Travatan Soln (Travoprost soln) .... One drop in each eye once daily 4)  Multivitamins Caps (Multiple vitamin) .... Take 1 tablet by mouth once a day 5)  Clozaril 100 Mg Tabs (Clozapine) .Marland Kitchen.. 100 mg  twice daily and 25 mg once daily 6)  Fluvoxamine Maleate 100 Mg Tabs (Fluvoxamine maleate) .... Take 3 tablet by mouth once a day 7)  Miralax Powd (Polyethylene glycol 3350) .... Dissolve 1 1/2 capful in 8 ounces water or juice and drink two times a day 8)  Milk of Magnesia 2400 Mg/55ml Susp (Magnesium hydroxide) .... Take 3 tablespoons by mouth two times a day 9)  Pravachol 40 Mg Tabs (Pravastatin sodium) .... Take 1 tablet by mouth once a day 10)  Dulcolax 5 Mg Tbec (Bisacodyl) .... Take 2 tablets by mouth every morning.  Other Orders: Influenza Vaccine MCR (96045)  Patient Instructions: 1)  Please schedule a follow-up appointment in 1 year. Prescriptions: PRAVACHOL 40 MG TABS (PRAVASTATIN SODIUM) Take 1 tablet by mouth once a day  #30 x 12   Entered and Authorized by:   Whitney Post MD   Signed by:  Whitney Post MD on 08/11/2010   Method used:   Print then Give to Patient   RxID:   [id number] MULTIVITAMINS  CAPS (MULTIPLE VITAMIN) Take 1 tablet by mouth once a day  #30 x 12   Entered and Authorized by:   Whitney Post MD   Signed by:   Whitney Post MD on 08/11/2010   Method used:   Print then Give to Patient   RxID:   [id number]    Orders Added: 1)  Influenza Vaccine MCR [00025] 2)  Est. Patient Level III [84696]   Immunizations Administered:  Influenza Vaccine # 1:    Vaccine Type: Fluvax MCR    Site: left deltoid    Mfr: GlaxoSmithKline    Dose: 0.5 ml    Route: IM    Given by: Stanton Kidney Ditzler RN    Exp. Date: 01/28/2011    Lot #: [id number]    VIS given: 02/22/10 version given August 11, 2010.  Flu Vaccine Consent Questions:    Do you have a history of severe allergic reactions to this vaccine? no    Any prior history of allergic reactions to egg and/or gelatin? no    Do you have a sensitivity to the preservative Thimersol? no    Do you have a past history of Guillan-Barre Syndrome? no    Do you currently have an acute febrile illness? no    Have you  ever had a severe reaction to latex? no    Vaccine information given and explained to patient? yes   Immunizations Administered:  Influenza Vaccine # 1:    Vaccine Type: Fluvax MCR    Site: left deltoid    Mfr: GlaxoSmithKline    Dose: 0.5 ml    Route: IM    Given by: Stanton Kidney Ditzler RN    Exp. Date: 01/28/2011    Lot #: [id number]    VIS given: 02/22/10 version given August 11, 2010.  Prevention & Chronic Care Immunizations   Influenza vaccine: Fluvax MCR  (08/11/2010)    Tetanus booster: Not documented    Pneumococcal vaccine: Not documented  Other Screening   Smoking status: never  (08/11/2010)  Lipids   Total Cholesterol: 201  (09/16/2008)   LDL: 141  (09/16/2008)   LDL Direct: Not documented   HDL: 43  (09/16/2008)   Triglycerides: 85  (09/16/2008)    SGOT (AST): 32  (08/08/2010)   SGPT (ALT): 59  (08/08/2010)   Alkaline phosphatase: 98  (08/08/2010)   Total bilirubin: 1.0  (08/08/2010)    Lipid flowsheet reviewed?: Yes   Progress toward LDL goal: Unchanged  Hypertension   Last Blood Pressure: 117 / 66  (08/11/2010)   Serum creatinine: 1.0  (08/08/2010)   Serum potassium 4.0  (08/08/2010)    Hypertension flowsheet reviewed?: Yes   Progress toward BP goal: At goal  Self-Management Support :   Personal Goals (by the next clinic visit) :      Personal blood pressure goal: 140/90  (10/01/2009)     Personal LDL goal: 130  (10/01/2009)    Patient will work on the following items until the next clinic visit to reach self-care goals:     Medications and monitoring: take my medicines every day, check my blood pressure, bring all of my medications to every visit, weigh myself weekly  (08/11/2010)     Eating: drink diet soda or water instead of juice or soda, eat more vegetables, use fresh or frozen vegetables, eat foods that are low in  salt, eat baked foods instead of fried foods, eat fruit for snacks and desserts  (08/11/2010)     Activity: take a 30 minute  walk every day  (08/11/2010)    Hypertension self-management support: Written self-care plan, Education handout, Resources for patients handout  (08/11/2010)   Hypertension self-care plan printed.   Hypertension education handout printed    Lipid self-management support: Written self-care plan, Education handout, Resources for patients handout  (08/11/2010)   Lipid self-care plan printed.   Lipid education handout printed      Resource handout printed.

## 2010-09-01 NOTE — Miscellaneous (Signed)
[facility]   Imported By: Margie Billet 12/06/2009 10:43:42  _____________________________________________________________________  External Attachment:    Type:   Image     Comment:   External Document

## 2010-09-01 NOTE — Miscellaneous (Signed)
[facility]: [facility]   Imported By: Margie Billet 12/13/2009 14:49:42  _____________________________________________________________________  External Attachment:    Type:   Image     Comment:   External Document

## 2010-09-01 NOTE — Progress Notes (Signed)
Summary: med refill/gp  Phone Note Refill Request Message from:  Fax from Pharmacy on June 07, 2010 10:01 AM  Refills Requested: Medication #1:  PRAVACHOL 40 MG TABS Take 1 tablet by mouth once a day   Last Refilled: 05/07/2010  Method Requested: Electronic Initial call taken by: Chinita Pester RN,  June 07, 2010 10:02 AM  Follow-up for Phone Call       Follow-up by: Clerance Lav MD,  June 07, 2010 11:14 AM    Prescriptions: PRAVACHOL 40 MG TABS (PRAVASTATIN SODIUM) Take 1 tablet by mouth once a day  #30 x 6   Entered and Authorized by:   Clerance Lav MD   Signed by:   Clerance Lav MD on 06/07/2010   Method used:   Electronically to        [facility] (retail)       [address]       [place], [place]  [id number]       Ph: [phone] or [phone]       Fax: [phone]   RxID:   [id number]

## 2010-09-01 NOTE — Assessment & Plan Note (Signed)
Summary: annual...as.   History of Present Illness Visit Type: Follow-up Visit Primary GI MD: Lina Sar MD Primary Provider: Riddhish Shah,MD Requesting Provider: n/a Chief Complaint: Annual check-up constipation, under control with regimin taken History of Present Illness:   This is a 45 year old African American male with a pseudoobstruction, dilated colon consistent with colonic inertia. His last office visit was in March 2011. He is on a chronic laxative regimen which includes Metamucil 1 tablespoon twice a day, MiraLax one and one half capful twice a day, milk of magnesia 45 cc p.o. b.i.d., Dulcolax tablet 2 p.o. q.d., magnesia and mag citrate one bottle twice a week. Additional medical problems include glaucoma, sarcoidosis, schizophrenia, splenomegaly and sleep apnea. Since his last visit, he has been having regular bowel movements. He denies fecal incontinence.   GI Review of Systems      Denies abdominal pain, acid reflux, belching, bloating, chest pain, dysphagia with liquids, dysphagia with solids, heartburn, loss of appetite, nausea, vomiting, vomiting blood, weight loss, and  weight gain.        Denies anal fissure, black tarry stools, change in bowel habit, constipation, diarrhea, diverticulosis, fecal incontinence, heme positive stool, hemorrhoids, irritable bowel syndrome, jaundice, light color stool, liver problems, rectal bleeding, and  rectal pain.    Current Medications (verified): 1)  Citrate of Magnesia 1.745 Gm/50ml Soln (Magnesium Citrate) .... Take 1 Bottle Two Times Per Week (On Thursdays and Mondays) 2)  Metamucil  Powd (Psyllium Powd) .... Take 1 Tablespoon By Mouth Two Times A Day 3)  Travatan  Soln (Travoprost Soln) .... One Drop in Each Eye Once Daily 4)  Multivitamins  Caps (Multiple Vitamin) .... Take 1 Tablet By Mouth Once A Day 5)  Clozaril 100 Mg Tabs (Clozapine) .Marland Kitchen.. 100 Mg Twice Daily and 25 Mg Once Daily 6)  Fluvoxamine Maleate 100 Mg Tabs  (Fluvoxamine Maleate) .... Take 3 Tablet By Mouth Once A Day 7)  Miralax   Powd (Polyethylene Glycol 3350) .... Dissolve 1 1/2 Capful in 8 Ounces Water or Juice and Drink Two Times A Day 8)  Milk of Magnesia 2400 Mg/3ml Susp (Magnesium Hydroxide) .... Take 3 Tablespoons By Mouth Two Times A Day 9)  Pravachol 40 Mg Tabs (Pravastatin Sodium) .... Take 1 Tablet By Mouth Once A Day 10)  Dulcolax 5 Mg Tbec (Bisacodyl) .... Take 2 Tablets By Mouth Every Morning.  Allergies (verified): No Known Drug Allergies  Past History:  Past Medical History: Reviewed history from 10/01/2008 and no changes required. 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 No FH of Colon Cancer: Family History of Diabetes: Mother  Social History: Reviewed history from 11/11/2008 and no changes required. Illicit Drug Use - no Has a caretaker Patient has never smoked.  Alcohol Use - no  Review of Systems  The patient denies allergy/sinus, anemia, anxiety-new, arthritis/joint pain, back pain, blood in urine, breast changes/lumps, change in vision, confusion, cough, coughing up blood, depression-new, fainting, fatigue, fever, headaches-new, hearing problems, heart murmur, heart rhythm changes, itching, menstrual pain, muscle pains/cramps, night sweats, nosebleeds, pregnancy symptoms, shortness of breath, skin rash, sleeping problems, sore throat, swelling of feet/legs, swollen lymph glands, thirst - excessive , urination - excessive  , urination changes/pain, urine  leakage, vision changes, and voice change.         Pertinent positive and negative review of systems were noted in the above HPI. All other ROS was otherwise negative.   Vital Signs:  Patient profile:   45 year old male Height:      76 inches Weight:      207.50 pounds BMI:     25.35 Pulse rate:   64 / minute Pulse rhythm:   regular BP sitting:   110 / 74  (left arm) Cuff size:   regular  Vitals Entered By: June McMurray CMA Duncan Dull) (August 08, 2010 8:20 AM)  Physical Exam  General:  alert, answers simple questions, in no distress. Eyes:  nonicteric. Mouth:  normal mucosa. Neck:  jugular veins not distended. Lungs:  Clear throughout to auscultation. Heart:  rapid S1-S2, no murmur. Abdomen:  soft relaxed abdomen with normal active bowel sounds. No palpable mass. No distention. Rectal:  large amount of impacted soft stool in the rectum which was Hemoccult negative. Extremities:  No clubbing, cyanosis, edema or deformities noted. Skin:  Intact without significant lesions or rashes. Psych:  Alert and cooperative. Normal mood and affect.   Impression & Recommendations:  Problem # 1:  * COLONIC INERTIA Patient has stable colonic inertia and is on an aggressive laxative regimen. Although he has stool impaction he is having bowel movements daily and I feel this is the best we can do. We need to keep a check on his magnesium and potassium as well as on his renal function.  Problem # 2:  Hx of ABSCESS, ANAL/RECTAL REGIONS (ICD-566) This is not an active problem.  Other Orders: TLB-CMP (Comprehensive Metabolic Pnl) (80053-COMP) TLB-Magnesium (Mg) (83735-MG)  Patient Instructions: 1)  We have given you written prescriptions for Metamucil, Miralax, Milk of Magnesia, Dulcolax and Magnesium Citrate. 2)  Your physician requests that you go to the basement floor of our office to have the following labwork completed before leaving today: CMET,  Magnesium. 3)  Office visit 6 months. 4)  Copy sent to : Riddhish Shah,MD 5)  The medication list was reviewed and reconciled.  All changed / newly prescribed medications were explained.  A complete medication list was provided to the patient / caregiver. Prescriptions: METAMUCIL  POWD (PSYLLIUM POWD) Take 1 tablespoon by mouth two times a day  #1 bottle x 6   Entered by:   Lamona Curl CMA (AAMA)   Authorized by:   Hart Carwin MD   Signed by:   Lamona Curl CMA (AAMA) on 08/08/2010   Method used:   Print then Give to Patient   RxID:   [id number] DULCOLAX 5 MG TBEC (BISACODYL) Take 2 tablets by mouth every morning.  #60 x 6   Entered by:   Lamona Curl CMA (AAMA)   Authorized by:   Hart Carwin MD   Signed by:   Lamona Curl CMA (AAMA) on 08/08/2010   Method used:   Print then Give to Patient   RxID:   [id number] MILK OF MAGNESIA 2400 MG/10ML SUSP (MAGNESIUM HYDROXIDE) Take 3 tablespoons by mouth two times a day  #1 bottle x 6   Entered by:   Lamona Curl CMA (AAMA)   Authorized by:   Hart Carwin MD   Signed by:   Lamona Curl CMA (AAMA) on 08/08/2010   Method used:   Print then Give to Patient   RxID:   [id number] MIRALAX   POWD (POLYETHYLENE GLYCOL 3350) Dissolve  1 1/2 capful in 8 ounces water or juice and drink two times a day  #527 grams x 6   Entered by:   Lamona Curl CMA (AAMA)   Authorized by:   Hart Carwin MD   Signed by:   Lamona Curl CMA (AAMA) on 08/08/2010   Method used:   Print then Give to Patient   RxID:   [id number] CITRATE OF MAGNESIA 1.745 GM/30ML SOLN (MAGNESIUM CITRATE) Take 1 bottle two times per week (on Thursdays and Mondays)  #8 bottles x 6   Entered by:   Lamona Curl CMA (AAMA)   Authorized by:   Hart Carwin MD   Signed by:   Lamona Curl CMA (AAMA) on 08/08/2010   Method used:   Print then Give to Patient   RxID:   [id number]

## 2010-09-01 NOTE — Miscellaneous (Signed)
Summary: [facility]  [facility]   Imported By: Lester [place] 08/12/2010 07:37:02  _____________________________________________________________________  External Attachment:    Type:   Image     Comment:   External Document

## 2010-11-13 ENCOUNTER — Encounter: Payer: Self-pay | Admitting: Internal Medicine

## 2010-12-13 NOTE — Assessment & Plan Note (Signed)
[facility]                         GASTROENTEROLOGY OFFICE NOTE   POLK, MINOR                      MRN:          [id number]  DATE:10/14/2007                            DOB:          June 21, 1966    [name] is a 45 year old African American male with colonic  inertia causing severe obstipation requiring hospitalization for  disimpaction which occurred in June 2007.  He since then has been on a  rigorous regimen of laxatives, and had last colonoscopy in June 2007  which showed just ulcers in the rectum due to a fecal impaction.  Otherwise, colon was normal.  He has done extremely well on a laxative  regimen.  He needs refills today.  The patient has about 3 or 4 small  bowel movements a day.  He is continent to stools.  He does not have  stools during the day, and there has been no rectal bleeding.  His  weight has been maintained.   MEDICATIONS:  1. MiraLax 1-1/2 capsules b.i.d.  2. Dulcolax tablets 2 p.o. q.a.m.  3. Metamucil 1 tbsp b.i.d.  4. Milk of magnesia 1 tbsp b.i.d.  5. Magnesium citrate 12 ounces weekly.   PHYSICAL EXAMINATION:  VITAL SIGNS:  Blood pressure 112/78, pulse 72,  and weight 219 pounds.  GENERAL:  He was alert and oriented, very cooperative.  LUNGS:  Clear to auscultation.  COR:  With normal S1, normal S2.  ABDOMEN:  Soft, not distended, with normoactive bowel sounds, no  tenderness, no palpable stool.  RECTAL:  With essentially empty rectal ampulla.  Normal rectal tone.  Stool was brown, Hemoccult negative.   IMPRESSION:  A 45 year old African American male with severe colonic  inertia, now under good control on a rigorous bowel regimen, which is  supervised by Mrs. Etheleen Mayhew, his caretaker.   PLAN:  1. I refilled all medications.  2. Today, CBC and CMET.  3. Return in 6 months.     Hedwig Morton. Juanda Chance, MD  Electronically Signed    DMB/MedQ  DD: 10/14/2007  DT: 10/14/2007  Job #: [id number]

## 2010-12-13 NOTE — Assessment & Plan Note (Signed)
[facility]                         GASTROENTEROLOGY OFFICE NOTE   [name], [name]                      MRN:          [id number]  DATE:02/20/2007                            DOB:          1966/01/03    [name] is a 45 -year-old [place] American male with pseudo  obstruction of the colon, severe obstipation requiring a daily laxative.  He has done extremely well now for one year being on a stable laxative  regimen which includes MiraLax 1.5 caps twice a day, Dulcolax tablets  two every day, magnesium citrate once a week and a Metamucil one  tablespoon daily. He is having two bowel movements a day which are  mostly liquid. He denies abdominal pain or rectal bleeding.   PHYSICAL EXAMINATION:  Blood pressure 116/72, pulse 76, weight is 216  pounds, which represent about 8 pound weight loss. The patient was  alert, partially oriented, very cooperative.  LUNGS:  Clear to auscultation.  COR: With rapid S1, S2.  ABDOMEN: Soft, scaphoid, very relaxed with hyperactive bowel sounds. No  tenderness. No palpable masses.  RECTAL: Shows empty rectal ampule with small amount of heme negative  stool.   IMPRESSION:  A 46 year old male with chronic constipation and pseudo  obstruction. He has colonic inertia. His colon has been dilated as per  colonoscopy on 01/01/06.   PLAN:  Continue the same laxative regimen. I will see him in about six  months.     Hedwig Morton. Juanda Chance, MD  Electronically Signed    DMB/MedQ  DD: 02/20/2007  DT: 02/20/2007  Job #: [id number]   cc:   [name]

## 2010-12-16 NOTE — Assessment & Plan Note (Signed)
[facility]                           GASTROENTEROLOGY OFFICE NOTE   [name] Branch, [name] Branch                      MRN:          [id number]  DATE:05/25/2006                            DOB:          1966/05/28    [name] Branch is a 45 year old African-American male with chronic  obstipation and colonic inertia on multiple laxative regimen.  He is doing  quite well.  His regimen consists of Dulcolax 2 tablets twice a day, mag  citrate 1 bottle once a week, Colace 100 mg twice a day, and MiraLax 26 gm  twice a day.  He is also on Milk of Magnesia.  He has a bowel movement  almost every day but his caretaker says that he has only small bowel  movements relative to how much he eats.  He has been stealing some food from  around the house and has gained 7 pounds.   PHYSICAL EXAM:  VITAL SIGNS:  Blood pressure 118/64.  Pulse 66.  Weight 222  pounds, which is a 7 pound weight gain.  GENERAL:  He is alert.  I am not sure that he is oriented, but he is  __________.  LUNGS:  Clear to auscultation.  CARDIAC:  Normal S1, S2.  ABDOMEN:  At this time is soft, nontender with normoactive bowel sounds.  No  palpable mass or stool.  RECTAL EXAM:  With normoactive tone.  There was stool in the rectal ampulla  which was heme-negative.   IMPRESSION:  This is a 45 year old male with colonic inertia and chronic  constipation.  Currently the laxative regimen is working out quite well.   PLAN:  1. Increase mag citrate to every 4-5 days.  2. Eat a banana, drink orange juice for potassium replacement.  3. Call us if the constipation recurs so we can do a KUB.  4. Office visit in 8 weeks.  5. All other laxatives the same.     Hedwig Morton. Juanda Chance, MD    DMB/MedQ  DD: 05/25/2006  DT: 05/27/2006  Job #: [id number]   cc:   [name] Branch, M.D.

## 2010-12-16 NOTE — Assessment & Plan Note (Signed)
[facility]                           GASTROENTEROLOGY OFFICE NOTE   DONA, KLEMANN                      MRN:          [id number]  DATE:02/13/2006                            DOB:          April 21, 1966    Mr. Manrique is a 45 year old African American male with obstipation.  He  was hospitalized in [facility] several weeks ago with  severe constipation and impaction.  It took several weeks to disimpact him  and ultimately colonoscopy by Dr. Marina Goodell on January 01, 2006, showed __________  ulcers secondary to fecal impaction.  Colon was extremely redundant on the  colonoscopy and chronically dilated but mucosa appeared normal.  Dr. Marina Goodell  was unable to pass beyond hepatic flexure due to the anatomy.  Patient is  now having bowel movements every day up to every other day.   MEDICATIONS:  1.  Colace 100 mg twice a day.  2.  Multivitamins.  3.  Metamucil 1 tablespoon twice a day.  4.  Magnesium citrate one bottle once a week.  5.  MiraLax one capful twice a day.   PHYSICAL EXAMINATION:  GENERAL APPEARANCE:  He was alert but not well  oriented.  VITAL SIGNS:  Blood pressure 118/80, pulse 88, weight 215 pounds which  represents 35 pound weight loss.  HEENT:  Sclerae are nonicteric.  Oral cavity normal.  LUNGS:  Clear to auscultation.  CARDIOVASCULAR:  S1 and S2 normal.  ABDOMEN:  Soft and nontender.  Relaxed with decreased muscle tone.  Normoactive bowel sounds.  No distention.  RECTAL:  Normal rectal tone with small amount of Hemoccult negative stool.   IMPRESSION:  A 45 year old African American male with chronic colonic  dysmotility, dilatation and chronic constipation.  He is now on satisfactory  colon cleansing regimen and we need to continue on that with frequent checks  as to the adequacy of the laxatives.   PLAN:  1.  Discontinue Colace, substitute for Dulcolax one to two tablets a day.  2.  Continue MiraLax one and  a half to two capfuls twice a day.  3.  Magnesium citrate once a week.  4.  High fiber diet and Metamucil on daily basis.  5.  I will see him again in eight weeks.                                   Hedwig Morton. Juanda Chance, MD   DMB/MedQ  DD:  02/13/2006  DT:  02/13/2006  Job #:  [id number]   cc:   Chapman Fitch, MD

## 2010-12-16 NOTE — Assessment & Plan Note (Signed)
[facility]                         GASTROENTEROLOGY OFFICE NOTE   [name] Branch, [name] Branch                      MRN:          [id number]  DATE:08/27/2006                            DOB:          05-Feb-1966    PROGRESS NOTE   [name] Branch is a 45 year old African-American male with obstipation  and colonic inertia.  He is on an intense laxative regimen.  He has  required prolonged hospitalization almost 10 months ago for fecal  impaction.  His underlying psychiatric diagnosis is schizophrenia.  He  also has ischemic cardiomyopathy, splenomegaly, possible sarcoidosis,  and mental retardation.   MEDICATIONS:  1. MiraLax 24 g twice a day.  2. Dulcolax 2 tablets a day.  3. Magnesium citrate once a week.  4. Metamucil.   With this regimen the patient has a bowel movement every day.  He has no  complaints.  He denies rectal bleeding, fecal incontinence, or crampy  abdominal pain.   PHYSICAL EXAM:  Blood pressure 124/84, pulse 104, weight 224 pounds.  He was alert.  He responded appropriately.  LUNGS:  Clear to auscultation.  COR:  Normal S1, normal S2.  ABDOMEN:  Soft without palpable stool.  Relaxed and nontender with  normoactive bowel sounds.  RECTAL:  Exam at this time shows no fecal impaction in the rectum.  Stool was trace Hemoccult positive.   IMPRESSION:  A 45 year old mentally retarded gentleman with chronic  colonic inertia, now doing well on a rather intense laxative regimen,  which includes some MiraLax, Dulcolax, magnesium citrate, and Metamucil.   PLAN:  He is to continue this regimen since it seems to be quite  effective.  I will see him again in 3 months.     Hedwig Morton. Juanda Chance, MD  Electronically Signed    DMB/MedQ  DD: 08/27/2006  DT: 08/27/2006  Job #: [id number]   cc:   [facility].

## 2010-12-16 NOTE — H&P (Signed)
[facility]  Patient:    [name] Branch, [name] Branch Visit Number: [id number] MRN: [id number]          Service Type: MED Location: [phone] Attending Physician:  Monica Becton Dictated by:   Claudette Laws, M.D. Admit Date:  12/20/2001                           History and Physical  CHIEF COMPLAINT:  Groin swelling.  HISTORY OF PRESENT ILLNESS:  This 45 year old, severely retarded, African-American male comes in with his caregiver because of a one-day history of odor in the groin area along with walking funny.  The caregiver examined him and noticed some swelling in the scrotal area and brought him to the [facility] Emergency Room.  Examination revealed what appeared to be an early Fourniers gangrene involving the scrotum, extending up into the right inguinal area.  There was a slight odor to the area.  No obvious gangrenous or black areas at this time.  Very fluctuant diffuse induration involving the right hemiscrotum, the right inguinal area, and the lower abdomen.  The patient himself is unable to give a history.  He is severely retarded.  He lives in a group home.  I did talk to his caregiver.  ALLERGIES:  He has no known drug allergies.  PAST MEDICAL HISTORY:  He has hypertension for which he takes Vasotec 5 mg b.i.d.  For his mental disorder, he takes Clozaril 125 mg b.i.d.  He also has sleep apnea and uses a CPAP.  He is a patient at the [employer].  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  PHYSICAL EXAMINATION:  An obese man, obviously retarded.  He is able to say his first name.  VITAL SIGNS:  BP 105/71, pulse 128, respirations 20, temperature 100.0 degrees oral.  HEENT:  Unremarkable.  CHEST:  Clear to auscultation and percussion.  HEART:  Regular rhythm.  No obvious murmurs.  ABDOMEN:  Obese, soft, and nontender.  Some rather  significant induration of the right inguinal area.  Some mild cellulitis and erythema present.  GENITALIA:  An uncircumcised male with some edema of the shaft of the penis and markedly enlarged right hemiscrotum occupied by erythematous mass, somewhat fluctuant.  Normal left testicle.  RECTAL:  Deferred until surgery.  IMPRESSION: 1. 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 2. Severe mental retardation. 3. Hypertension. 4. Exogenous obesity.  PLAN:  I discussed the situation with he and the caregiver.  According to the caregiver, he had a cookie and diet Coke about 4 p.m. this afternoon.  We are seeing him now about 9:30 p.m. in the [facility] ER.  I asked Sandria Bales. Ezzard Standing, M.D., to see him with me prior to I&D and possible debridement depending upon the surgical findings.  The caregiver will sign the operative permit. Dictated by:   Claudette Laws, M.D. Attending Physician:  Monica Becton DD:  12/20/01 TD:  12/23/01 Job: [id number]

## 2010-12-16 NOTE — Discharge Summary (Signed)
NAME:  [name] Branch, RYANT             ACCOUNT NO.:  [id number]   MEDICAL RECORD NO.:  [id number]          PATIENT TYPE:  INP   LOCATION:  6710                         FACILITY:  [facility]   PHYSICIAN:  Sharin Mons, M.D.    DATE OF BIRTH:  1966/07/18   DATE OF ADMISSION:  12/20/2005  DATE OF DISCHARGE:  01/01/2006                                 DISCHARGE SUMMARY   STAT DISCHARGE SUMMARY   DISCHARGE DIAGNOSES:  1.  Chronic constipation with fecal impaction status post colonoscopy      revealing secondary stercoral ulcer.  2.  Schizophrenia.  3.  Ischemic cardiomyopathy (ejection fraction 55 to 60%, no wall motion      abnormalities, trivial tricuspid regurgitation in June 2004).  4.  Splenomegaly.  5.  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  6.  Fournier's gangrene/perineal scrotal inguinal abscess status post      incision and drainage.  7.  Mental retardation.   CONSULTATIONS:  1.  Malcolm T. Russella Dar, MD, [facility], Gastroenterology.  2.  Wilhemina Bonito. Marina Goodell, MD, [facility], Gastroenterology.   PROCEDURES:  1.  Dec 20, 2005:  Acute abdominal series - 1)  No active cardiopulmonary      disease; 2)  Massive amount of stool within the mid distal colon.  2.  Dec 23, 2005:  Barium enema with Gastrografin - Impression:      Therapeutic, not diagnostic.  3.  January 01, 2006:  Colonoscopy:  Assessment:  Stercoral ulcer secondary to      fecal impaction, colonic obstruction due to fecal impaction,      constipation.   MEDICATIONS:  1.  Clozapine 125 mg p.o. b.i.d.  2.  Luvox 300 mg p.o. q.h.s.  3.  Patanol 1 drop both eyes b.i.d.  4.  Travatan 1 drop both eyes q.h.s.  5.  MiraLax 17 gm b.i.d. p.r.n.  6.  Maxitrate 150 ml p.o. p.r.n. constipation.  7.  Colace 100 mg p.o. p.r.n. constipation.  8.  Metamucil 1 to 2 teaspoons p.o. p.r.n. constipation.   CONDITION ON DISCHARGE:  [name] was admitted with a severe fecal  impaction.  Following a very aggressive and protracted bowel regimen, the  patient was disimpacted.   At the time of discharge, the patient is doing  well without complaints.  He is to follow up at the [facility], Dr. Windy Fast, on June 14th, 8:45 a.m., and Dr. Marina Goodell with  Gastroenterology on June 28th at 9 a.m.   BRIEF ADMITTING HISTORY AND PHYSICAL:  [name] is a 45 year old,  African-American male with a past medical history of schizophrenia and  chronic constipation, who was brought to the hospital by his primary  caretaker due to patient complaints of feeling increased fatigue and not  acting like himself, per his caregiver.  Bowel movements have decreased  from one q.week to one over the past two weeks.  He was recently prescribed  MiraLax by his primary physician and was to be followed up with a GI consult  for his constipation.  The patient denies any nausea, vomiting, chest pain,  shortness of breath, cough, or dysuria.  VITAL SIGNS:  Temperature 97.0, blood pressure 165/103, heart rate 105,  respirations 20, oxygen saturation 98% on room air.   PHYSICAL EXAMINATION:  GENERAL:  Calm, quiet.  HEENT:  Injected conjunctivae with bilateral chemosis tearing, no cervical  lymphadenopathy, no thyromegaly, no lesions.  NECK:  Supple.  PULMONARY:  Breath sounds clear to auscultation bilaterally.  CVS:  S1 and S2 without murmurs, thrills, rubs, or gallops.  ABDOMEN:  Marked distention with no active bowel sounds appreciated,  protuberant with rounded appearance, no rebound or guarding.  GI:  No stool in the rectal vault.  EXTREMITIES:  No edema or cyanosis.  SKIN:  No rashes.  NEURO:  No focal deficits.  PSYCH:  Flat affect with masked facies.   ADMISSION LABORATORY DATA:  White blood cells 5.9, hemoglobin 13.2,  hematocrit 40.1, platelets 157; PT 13.6, INR 1.0, PTT 28; sodium 140,  potassium 4.1, chloride 106, bicarb 25, glucose 81, BUN 9, creatinine 1.2;  total bilirubin 0.8, alkaline-phosphatase 111, AST 28, ALT 49, total protein  7.4, albumin 4.4, calcium  9.4; CK at 174, MB 2.0, index 1.1, troponin less  than 0.01; free T4 1.3, TSH 1.02.   HOSPITAL COURSE BY PROBLEM:  1.  Chronic constipation:  Mr. Prestia was admitted with a massive fecal      impaction relieved only after a very aggressive bowel regimen consisting      of t.i.d. MiraLax as well as Dulcolax, Colace.  Following relief of the      impaction, colonoscopy was performed.  No obstruction was seen; however,      the colon is extremely redundant, chronically dilated, and it was not      possible to visualize beyond the hepatic flexure.  An ulcer was seen in      the sigmoid colon and was felt to be secondary to fecal impaction.      Constipation is felt to be secondary to Clozaril.  We will, however,      defer medication adjustments to Psychiatry.  2.  Schizophrenia:  The patient home medications of Clozaril and Luvox were      continued.  3.  Obstructive sleep apnea:  The patient's CPAP was continued overnight      during his hospital stay.  4.  Glaucoma:  The patient's Travatan drops and Patanol drops were provided      through his hospital stay.  5.  Hypertension:  Mr. Akul Leggette was held, and his blood pressure      remains within normal limits through his hospital stay.      Sharin Mons, M.D.     WC/MEDQ  D:  01/01/2006  T:  01/01/2006  Job:  [id number]   cc:   Wilhemina Bonito. Marina Goodell, M.D. [facility]  [address]  [place]  [place] [address]   Venita Lick. Russella Dar, M.D. [facility]  [address]  [place]  [place] [address]   Chapman Fitch, MD  Fax: [phone]   [facility]   Dr. Gwyndolyn Kaufman ??? with Psychiatry

## 2010-12-16 NOTE — Assessment & Plan Note (Signed)
[facility]                           GASTROENTEROLOGY OFFICE NOTE   [name] Branch, [name] Branch                      MRN:          [id number]  DATE:04/09/2006                            DOB:          [birth year]    [name] Branch is a 45 year old gentleman with obstipation, large redundant  colon, colonic inertia resulting in severe constipation.  He was  hospitalized from May 26 to January 01, 2006 with severe constipation and fecal  impaction.  __________ ulcers from rectal pressure.  He was disimpacted,  colonoscoped and now has been forwarded as an outpatient with large stool  laxatives.  Since his last appointment on February 13, 2006 he has done very  well, having bowel movements daily.  His regimen consists of Maalox 17 grams  three times a day, Dulcolax 2 tablets at bedtime and Mag Citrate once a  week.   PHYSICAL EXAMINATION:  VITAL SIGNS:  Blood pressure 108/82.  Pulse 64.  Weight 215 pounds.  GENERAL:  Patient is mentally retarded.  He is alert and oriented.  LUNGS:  Clear to auscultation.  HEART:  Normal S1, S2.  ABDOMEN:  Soft and relaxed with normoactive bowel sounds.  No distention.  No palpable stool.  RECTAL EXAM:  By [name] Branch, is still P.A., showed no significant stool.  Specimen  was heme-negative.   IMPRESSION:  A 45 year old gentleman with severe obstipation and chronic  inertia, currently well-prepped.  No evidence of impaction.   PLAN:  1. Continue the same regimen.  2. Office visit 6-8 weeks.  We will do a rectal exam and determine if he      needs a KUB.                                   [name] Branch. Juanda Chance, MD   DMB/MedQ  DD:  04/09/2006  DT:  04/10/2006  Job #:  [id number]

## 2010-12-16 NOTE — Consult Note (Signed)
[facility]  Patient:    [name] Branch, [name] Branch Visit Number: [id number] MRN: [id number]          Service Type: MED Location: [phone] Attending Physician:  Monica Becton Proc. Date: 12/20/01 Admit Date:  12/20/2001   CC:         [name] Branch, M.D.   Consultation Report  CONSULTATION/PROCEDURE  REASON FOR CONSULTATION:  Perineal and inguinal abscess.  HISTORY OF PRESENT ILLNESS:  [name] Branch is a 45 year old, somewhat retarded black male who lives at a home that cares of him.  The caretaker home noticed he started walking funny.  He presents with a fever.  He was identified as having a right scrotal abscess down towards his perineum and was taken to the operating room by Dr. Etta Grandchild for exploration of this abscess.  He has had no prior history of peptic ulcer disease, liver disease, pancreas disease, bowel disease, no Crohns disease or colitis, he has had no prior abdominal surgery.  He has had no prior known urologic problems such as infections of his penis or scrotum.  He has had prior operations.  PHYSICAL EXAMINATION:  Genitourinary:  He has a swollen, edematous scrotum right side worse than the left with inflammation up into his right groin.  He has point drainage in the lower part of his scrotum.  His anus and perianal area appeared spared from this infection.  Abdomen:  Protuberant which was nontender.  He has active bowel sounds, no guarding or rebound.  Vital signs: Temperature 100, pulse 128, respirations 20, blood pressure 105/71.  LABORATORY DATA:  Labs that I have show a white blood cell count 15,100, hemoglobin 13.7, hematocrit 39.  Sodium 135, potassium 3.6, chloride 105, CO2 24, glucose 178, BUN 31, and creatinine 2.1.  Liver functions were within normal limits.  PROCEDURE:  In the operating room, I assisted and looked at with Dr. Etta Grandchild, and he has a soft tissue kind of cellulitis with interspersing foul smelling pus.   We opened this up from the base of his scrotum, up to this right groin making a probable 10-12 inch incision.  We broke into several loculated abscesses, we thought we got most of these controlled, debrided most of the material in the right groin, irrigated with 3 liters of saline, and packed it with Betadine gauze.  Dr. Etta Grandchild inserted a Foley.  He will dictate his portion of the operation.  The patient was Unasyn antibiotics.  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 We will watch him in the intensive care unit overnight and start dressing changes tomorrow.  He will return to the operating room as needed. Attending Physician:  Monica Becton DD:  12/20/01 TD:  12/24/01 Job: [id number]

## 2010-12-16 NOTE — Discharge Summary (Signed)
[facility]  Patient:    [name] Branch, [name] Branch Visit Number: [id number] MRN: [id number]          Service Type: MED Location: [phone] Attending Physician:  Monica Becton Dictated by:   Claudette Laws, M.D. Admit Date:  12/20/2001 Discharge Date: 12/26/2001   CC:         Sandria Bales. Ezzard Standing, M.D.   Discharge Summary  HISTORY OF PRESENT ILLNESS:  This is a 45 year old, mentally retarded, African-American patient who lives in a group home who was brought to the emergency room by his caretaker on the evening of admission with some foul smelling pus from the scrotum along with a funny gait.  In the emergency room, he was found to have what appeared to be an early Fournier gangrene with draining abscess out the scrotum with considerable edema and inflammation extending up the right hemiscrotum into the inguinal canal.  There appeared to be no obvious involvement of the rectum.  The etiology of this is uncertain at this time.  ALLERGIES:  No known drug allergies.  MEDICATIONS: 1. Clozaril 125 mg b.i.d. for his mental condition. 2. Vasotec 5 mg b.i.d. for hypertension. 3. Sleep apnea and uses a CPAP at night.  SOCIAL HISTORY:  He lives at a group home with a caretaker who brought him in into the emergency room.  LABORATORY DATA AND X-RAY FINDINGS:  The wound culture actually grew out nothing, although the Gram smear as expected showed some gram-positive cocci and a few gram-negative rods of polymicrobial organism.  Electrolytes were normal with BUN of 18 and creatinine of 1.3.  Blood cultures were also drawn and these were negative.  His initial white count was 18,100 and the day before discharge it had dropped down to 12,100.  Hemoglobin was 12.3 at discharge, hematocrit 36.2.  The rest of his laboratory work was unremarkable.  He did have a PICC line put in on Dec 23, 2001, for his IV antibiotics.  Chest x-ray showed no acute abnormalities.  EKG  showed sinus tachycardia with left axis deviation and right bundle branch block.  HOSPITAL COURSE:  The patient was examined in the emergency room.  He was seen by Dr. Ovidio Kin from general surgery for consultation.  He was taken right to the operating room that evening where he underwent incision and drainage with debridement through an incision that extended from the right hemiscrotum up into the inguinal canal about 10-12 inches.  We encountered several pus pockets which were opened up.  We debrided some necrotic tissue and irrigated with several layers of saline and then packed the incision with Betadine gauze.  We put a Foley catheter in without incident.  There was no evidence of any urethral stricture.  The urine was grossly clear.  Postoperatively, we changed the packing twice a day with a damp saline Kerlix gauze and then had physical therapy come in for pulse lavage.  The wound seemed to heal up and clean up very nicely over the next four to five days.  At about the fifth day, we felt it was safe to send him on home.  We made arrangements for a visiting health nurse to come in twice a day to change the packing with again a damp, Kerlix gauze anticipating over a period of several weeks he will granulate in. He had some penile and scrotal edema which seemed to resolve nicely.  We removed the Foley catheter one day prior to discharge and he did void  satisfactorily.  He was afebrile.  DISCHARGE DIAGNOSES: 1. Early Fournier gangrene/perineal scrotal inguinal abscess. 2. Mental retardation. 3. Exogenous obesity. 4. Hypertension.  PROCEDURE:  Incision and drainage with deep debridement of the right hemiscrotum, perineum and right inguinal canal.  COMPLICATIONS:  None.  CONDITION ON DISCHARGE:  Stable.  DISCHARGE MEDICATIONS: 1. Renew home medications. 2. Amoxicillin 250 mg t.i.d. x10 days.  FOLLOWUP:  Return to see Dr. Etta Grandchild in one week for followup. Dictated by:    Claudette Laws, M.D. Attending Physician:  Monica Becton DD:  12/26/01 TD:  12/28/01 Job: [id number]

## 2010-12-16 NOTE — Op Note (Signed)
[facility]  Patient:    YOAV, OKANE Visit Number: [id number] MRN: [id number]          Service Type: MED Location: [id number] Attending Physician:  Monica Becton Dictated by:   Claudette Laws, M.D. Proc. Date: 12/20/01 Admit Date:  12/20/2001                             Operative Report  PREOPERATIVE DIAGNOSIS:  Fourniers gangrene perineum, right hemiscrotum, right inguinal canal.  POSTOPERATIVE DIAGNOSIS:  Fourniers gangrene perineum, right hemiscrotum, right inguinal canal.  OPERATION/PROCEDURE:  Incision and drainage and debridement of Fourniers gangrene right hemiscrotum, perineum, and inguinal canal.  SURGEON:  Claudette Laws, M.D.  INTRAOPERATIVE CONSULTATION:  Sandria Bales. Ezzard Standing, M.D.  HISTORY:  This is a 45 year old, severely mentally retarded, African-American male who presented in the [facility] Emergency Room this evening with a 1-day history of odor in the perineum, according to the caretaker along with a "funny gait."  She examined him and discovered some swelling.  He was brought to the emergency room where he had some foul smelling, pus-like drainage out the dependent portion of the right hemiscrotum with significant swelling and induration all the way up through the inguinal canal and some erythema.  This appeared to be an early Fourniers gangrene.  His white cell count was 18,100.  His abdomen was fairly benign and soft.  The induration did not seem to extend into the rectal area.  It was discussed with his caregiver and mother that this was a severe emergency and we needed to take him right to the operating room, debride it and drain it; with the idea that if the infection spreads we would have to bring him back for further debridement.  They understand and signed the operative permit.  He was given 3 g of Unasyn IV preop.  DESCRIPTION OF PROCEDURE:  The patient was prepped and draped in the  dorsal lithotomy position under intubated general anesthesia with his legs up in stirrups.  A Foley catheter was placed without incident.  Grossly clear urine was obtained.  We then started at the most dependent portion of the scrotum where some pus-like drainage was noted for aerobic and anaerobic cultures.  I then made an approximately 12-to-13-inch incision starting in the right hemiscrotum working their way up all the way to the right inguinal canal. This area was opened up widely, pockets of pus were opened up, were kneaded, and the areas of dead tissue were debrided.  All bleeders were electrocoagulated.  As far as we could tell tonight, the area of inflammation was confined to the scrotum, deep into the perineum, extending up into the inguinal canal, although in general the tissues looked fairly viable.  We then irrigated the wound with about 4 L of saline.  Then with a 6-inch Betadine gauze we packed the incision, followed by 4 x 4 and a net dressing.  The patient was then taken back to the recovery room in satisfactory condition. Dictated by:   Claudette Laws, M.D. Attending Physician:  Monica Becton DD:  12/20/01 TD:  12/24/01 Job: [phone] [id number]

## 2011-04-28 ENCOUNTER — Encounter: Payer: Self-pay | Admitting: Internal Medicine

## 2011-04-28 ENCOUNTER — Ambulatory Visit (INDEPENDENT_AMBULATORY_CARE_PROVIDER_SITE_OTHER): Payer: Medicare Other | Admitting: Internal Medicine

## 2011-04-28 VITALS — BP 122/82 | HR 84 | Resp 17 | Ht 76.0 in | Wt 199.0 lb

## 2011-04-28 MED ORDER — POLYETHYLENE GLYCOL 3350 17 GM/SCOOP PO POWD: ORAL | Status: DC

## 2011-04-28 MED ORDER — CITRATE OF MAGNESIA PO SOLN: ORAL | Status: DC

## 2011-04-28 MED ORDER — PSYLLIUM 58.6 % PO POWD: ORAL | Status: DC

## 2011-04-28 MED ORDER — BISACODYL 5 MG PO TBEC: DELAYED_RELEASE_TABLET | ORAL | Status: DC

## 2011-04-28 MED ORDER — MAGNESIUM HYDROXIDE 2400 MG/10ML PO SUSP: ORAL | Status: DC

## 2011-04-28 NOTE — Progress Notes (Signed)
[name] Branch 1966/01/30 MRN [id number]     History of Present Illness:  This is a 45 year old African American male with colonic inertia and colonic pseudoobstruction. He has a history of schizophrenia, splenomegaly and high blood pressure. He is on a laxative regimen which includes Metamucil 1 tablespoon twice a day, MiraLax 1.5 capsules twice a day, milk of magnesia 45 cc twice a day, Dulcolax tablets 2 by mouth every morning and magnesium citrate 1 bottle twice a week on Tuesdays and Saturdays. He is having daily bowel movements. His potassium and magnesium have been checked periodically and are normal. His last office visit was in January 2012. He weighed 207 pounds at that time and he weighs 199 pounds today. 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 He does not voice any complaints.   Past Medical History  Diagnosis Date  . Colonic inertia   . Hyperlipidemia   . Abscess of anal and rectal regions   . OSA (obstructive sleep apnea)   . Hypertension   . Constipation   . Sarcoidosis   . 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 Schizophrenia   . Mental retardation   . Cardiomyopathy   . Glaucoma    Past Surgical History  Procedure Date  . Groin surgery     reports that he has never smoked. He has never used smokeless tobacco. He reports that he does not drink alcohol or use illicit drugs. family history includes Diabetes in his mother.  There is no history of Colon cancer. No Known Allergies      Review of Systems:  The remainder of the 10 point ROS is negative except as outlined in H&P   Physical Exam: General appearance  Well developed, in no distress. Eyes- non icteric. HEENT nontraumatic, normocephalic. Mouth no lesions, tongue papillated, no cheilosis. Neck supple without adenopathy, thyroid not enlarged, no carotid bruits, no JVD. Lungs Clear to auscultation bilaterally. Cor normal S1, normal S2, regular rhythm, no murmur,   quiet precordium. Abdomen: Protuberant soft nontender with palpable stool throughout the colon. No hernia. Rectal: Large amount of impacted stool which is Hemoccult-negative. Extremities no pedal edema. Skin no lesions. Neurological alert and oriented x 3. Psychological normal mood and affect. Answers simple questions.  Assessment and Plan:  Problem #1 Colonic inertia and pseudoobstruction with chronic obstipation. Patient has been on a rigorous bowel regimen. He is very stable despite of being partially impacted at times. This is the best he has done and we will keep his laxative regimen about the same and will see him again in 6 months. I feel that if we increase his laxative regimen, we would be running into problems with electrolyte imbalance.   04/28/2011 Lina Sar

## 2011-04-28 NOTE — Patient Instructions (Addendum)
We have given you the following medications to take your pharmacy to get at your convenience: Metamucil, Milk of Magnesia, Miralax, Dulcolax, Magnesium Citrate Please follow up with Dr Juanda Chance in January 2013. CC: [facility]; attn Dr Sherryll Burger

## 2011-08-31 ENCOUNTER — Encounter: Payer: Medicare Other | Admitting: Internal Medicine

## 2011-09-04 ENCOUNTER — Encounter: Payer: Self-pay | Admitting: Internal Medicine

## 2011-09-04 ENCOUNTER — Ambulatory Visit (INDEPENDENT_AMBULATORY_CARE_PROVIDER_SITE_OTHER): Payer: Medicare Other | Admitting: Internal Medicine

## 2011-09-04 VITALS — BP 114/70 | HR 99 | Temp 98.1°F | Ht 74.0 in | Wt 190.9 lb

## 2011-09-04 DIAGNOSIS — E785 Hyperlipidemia, unspecified: Secondary | ICD-10-CM

## 2011-09-04 DIAGNOSIS — Z23 Encounter for immunization: Secondary | ICD-10-CM

## 2011-09-04 DIAGNOSIS — R000 Tachycardia, unspecified: Secondary | ICD-10-CM

## 2011-09-04 LAB — CBC WITH DIFFERENTIAL/PLATELET
Basophils Absolute: 0 10*3/uL (ref 0.0–0.1)
Basophils Relative: 0 % (ref 0–1)
Eosinophils Absolute: 0 10*3/uL (ref 0.0–0.7)
HCT: 46 % (ref 39.0–52.0)
Hemoglobin: 15 g/dL (ref 13.0–17.0)
Lymphocytes Relative: 30 % (ref 12–46)
MCH: 29.6 pg (ref 26.0–34.0)
Monocytes Relative: 8 % (ref 3–12)
Neutro Abs: 2.4 10*3/uL (ref 1.7–7.7)
Neutrophils Relative %: 61 % (ref 43–77)
Platelets: 161 10*3/uL (ref 150–400)
RDW: 14.3 % (ref 11.5–15.5)
WBC: 4 10*3/uL (ref 4.0–10.5)

## 2011-09-04 LAB — LIPID PANEL
Cholesterol: 192 mg/dL (ref 0–200)
HDL: 53 mg/dL (ref >39)
Triglycerides: 56 mg/dL (ref <150)

## 2011-09-04 LAB — TSH: TSH: 0.956 u[IU]/mL (ref 0.350–4.500)

## 2011-09-04 MED ORDER — PRAVASTATIN SODIUM 40 MG PO TABS: 40.0000 mg | ORAL_TABLET | Freq: Every day | ORAL | Status: DC

## 2011-09-04 MED ORDER — MULTIVITAMINS PO CAPS: 1.0000 | ORAL_CAPSULE | Freq: Every day | ORAL | Status: DC

## 2011-09-04 NOTE — Progress Notes (Deleted)
Subjective:   Patient ID: [name] Branch male   DOB: May 12, 1966 46 y.o.   MRN: [id number]  HPI: [name] Branch is a 46 y.o. man with mental retardation, chronic constipation, and HL presents for routine follow-up. He is accompanied by a caretaker. He has no physical complaints at present and says that he feels well. He needs refills for pravastatin and multivitamin   Past Medical History  Diagnosis Date  . Colonic inertia   . Hyperlipidemia   . Abscess of anal and rectal regions   . OSA (obstructive sleep apnea)   . Hypertension   . Constipation   . Sarcoidosis   . 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 Schizophrenia   . Mental retardation   . Cardiomyopathy   . Glaucoma    Current Outpatient Prescriptions  Medication Sig Dispense Refill  . bisacodyl (DULCOLAX) 5 MG EC tablet Take 2 capsules by mouth every morning  60 tablet  10  . cloZAPine (CLOZARIL) 100 MG tablet Take 100 mg by mouth daily.        . magnesium citrate SOLN Take 1 bottle by mouth twice per week (Tuesday, Saturday)  8 Bottle  10  . Magnesium Hydroxide (MILK OF MAGNESIA CONCENTRATE) 2400 MG/10ML SUSP Take 45 cc (3 tablespoons) by mouth twice daily.  2700 mL  10  . Multiple Vitamin (MULTIVITAMIN) capsule Take 1 capsule by mouth daily.        . polyethylene glycol powder (GLYCOLAX/MIRALAX) powder Dissolve 1.5 capful (26 grams) in at least 8 ounces water/juice and drink twice daily  800 g  10  . pravastatin (PRAVACHOL) 40 MG tablet Take 40 mg by mouth daily.        . psyllium (METAMUCIL) 58.6 % powder Take 1 tablespoon dissolved in at least 8 ounces water/juice and drink twice daily.  900 g  10  . travoprost, benzalkonium, (TRAVATAN) 0.004 % ophthalmic solution 1 drop at bedtime.         Family History  Problem Relation Age of Onset  . Diabetes Mother   . Colon cancer Neg Hx    History   Social History  . Marital Status: Single    Spouse Name: N/A    Number of Children: 0  . Years of Education: N/A    Occupational History  .     Social History Main Topics  . Smoking status: Never Smoker   . Smokeless tobacco: Never Used  . Alcohol Use: No  . Drug Use: No  . Sexually Active: No     has a caretaker, on aggressive colon regimen, often gets constipation,    Other Topics Concern  . Not on file   Social History Narrative   Has a caretaker   Review of Systems: Constitutional: Denies fever, chills, diaphoresis, appetite change and fatigue.  HEENT: Denies photophobia, eye pain, redness, hearing loss, ear pain, congestion, sore throat, rhinorrhea, sneezing, mouth sores, trouble swallowing, neck pain, neck stiffness and tinnitus.   Respiratory: Denies SOB, DOE, cough, chest tightness,  and wheezing.   Cardiovascular: Denies chest pain, palpitations and leg swelling.  Gastrointestinal: Denies nausea, vomiting, abdominal pain, diarrhea, constipation, blood in stool and abdominal distention.  Genitourinary: Denies dysuria, urgency, frequency, hematuria, flank pain and difficulty urinating.  Musculoskeletal: Denies myalgias, back pain, joint swelling, arthralgias and gait problem.  Skin: Denies pallor, rash and wound.  Neurological: Denies dizziness, seizures, syncope, weakness, light-headedness, numbness and headaches.  Hematological: Denies adenopathy. Easy bruising, personal or family bleeding history  Psychiatric/Behavioral: Denies suicidal ideation, mood changes, confusion, nervousness, sleep disturbance and agitation  Objective:  Physical Exam: There were no vitals filed for this visit. Constitutional: Vital signs reviewed.  Patient is a well-developed and well-nourished *** in no acute distress and cooperative with exam. Alert and oriented x3.  Head: Normocephalic and atraumatic Ear: TM normal bilaterally Mouth: no erythema or exudates, MMM Eyes: PERRL, EOMI, conjunctivae normal, No scleral icterus.  Neck: Supple, Trachea midline normal ROM, No JVD, mass, thyromegaly, or carotid  bruit present.  Cardiovascular: RRR, S1 normal, S2 normal, no MRG, pulses symmetric and intact bilaterally Pulmonary/Chest: CTAB, no wheezes, rales, or rhonchi Abdominal: Soft. Non-tender, non-distended, bowel sounds are normal, no masses, organomegaly, or guarding present.  GU: no CVA tenderness Musculoskeletal: No joint deformities, erythema, or stiffness, ROM full and no nontender Hematology: no cervical, inginal, or axillary adenopathy.  Neurological: A&O x3, Strenght is normal and symmetric bilaterally, cranial nerve II-XII are grossly intact, no focal motor deficit, sensory intact to light touch bilaterally.  Skin: Warm, dry and intact. No rash, cyanosis, or clubbing.  Psychiatric: Normal mood and affect. speech and behavior is normal. Judgment and thought content normal. Cognition and memory are normal.   Assessment & Plan:

## 2011-09-04 NOTE — Progress Notes (Signed)
Subjective:   Patient ID: [name] Branch male   DOB: 06-Apr-1966 46 y.o.   MRN: [id number]  HPI: [name] is a 46 y.o. man with colonic inertia, schizofrenia, and HL presents for routine follow-up. He is accompanied by a caretaker. He has no physical complaints today and says that he feels well. He needs refills for pravastatin and multivitamin  His caregiver tells me that [name] walks approximately 1 mile per day. This is poor walking and he often comes home sweaty. He then exercises 10 minutes on a stationary bike. He denies any chest pain or shortness of breath with these activities. He has been trying to lose weight. His caregiver tells me that he started off about 320 pounds but now has lost approximately 100 pounds. She is very proud of his progress. She controls his diet. She tells me that he is to be on medication for hypertension but does not need them anymore.  Past Medical History  Diagnosis Date  . Colonic inertia   . Hyperlipidemia   . Abscess of anal and rectal regions   . OSA (obstructive sleep apnea)   . Hypertension   . Constipation   . Sarcoidosis   . 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 Schizophrenia   . Mental retardation   . Cardiomyopathy   . Glaucoma    Current Outpatient Prescriptions  Medication Sig Dispense Refill  . bisacodyl (DULCOLAX) 5 MG EC tablet Take 2 capsules by mouth every morning  60 tablet  10  . cloZAPine (CLOZARIL) 100 MG tablet Take 100 mg by mouth daily.        . magnesium citrate SOLN Take 1 bottle by mouth twice per week (Tuesday, Saturday)  8 Bottle  10  . Magnesium Hydroxide (MILK OF MAGNESIA CONCENTRATE) 2400 MG/10ML SUSP Take 45 cc (3 tablespoons) by mouth twice daily.  2700 mL  10  . Multiple Vitamin (MULTIVITAMIN) capsule Take 1 capsule by mouth daily.        . polyethylene glycol powder (GLYCOLAX/MIRALAX) powder Dissolve 1.5 capful (26 grams) in at least 8 ounces water/juice and drink twice daily  800 g  10  .  pravastatin (PRAVACHOL) 40 MG tablet Take 40 mg by mouth daily.        . psyllium (METAMUCIL) 58.6 % powder Take 1 tablespoon dissolved in at least 8 ounces water/juice and drink twice daily.  900 g  10  . travoprost, benzalkonium, (TRAVATAN) 0.004 % ophthalmic solution 1 drop at bedtime.         Family History  Problem Relation Age of Onset  . Diabetes Mother   . Colon cancer Neg Hx    History   Social History  . Marital Status: Single    Spouse Name: N/A    Number of Children: 0  . Years of Education: N/A   Occupational History  .     Social History Main Topics  . Smoking status: Never Smoker   . Smokeless tobacco: Never Used  . Alcohol Use: No  . Drug Use: No  . Sexually Active: No     has a caretaker, on aggressive colon regimen, often gets constipation,    Other Topics Concern  . None   Social History Narrative   Has a caretaker   Review of Systems: Constitutional: Denies fever, chills HEENT: Denies blurry vision or eye pain Respiratory: Denies SOB, DOE, cough Cardiovascular: Denies chest pain,  Gastrointestinal: Denies nausea, vomiting, or abdominal pain,   Genitourinary:  Denies dysuria,  Musculoskeletal: Denies myalgias, arthralgias or back pain Neurological: Denies dizziness and headaches.    Objective:  Physical Exam: Filed Vitals:   09/04/11 0821  BP: 114/70  Pulse: 99  Temp: 98.1 F (36.7 C)  TempSrc: Oral  Height: 6\' 2"  (1.88 m)  Weight: 190 lb 14.4 oz (86.592 kg)   Constitutional: Vital signs reviewed.  Patient is a well-developed and well-nourished man in no acute distress and cooperative with exam. Head: Normocephalic and atraumatic Mouth: no erythema or exudates, MMM Eyes: PERRL, EOMI, there is conjunctival injection and exophthalmos present Cardiovascular: Borderline tachycardic rate but regular rhythm, S1 normal, S2 normal, no MRG, pulses symmetric and intact bilaterally Pulmonary/Chest: CTAB, no wheezes, rales, or rhonchi Abdominal:  Soft. Non-tender, non-distended, bowel sounds are normal, no masses, organomegaly, or guarding present.  Musculoskeletal: No joint deformities, erythema, or stiffness, ROM full and no nontender Neurological:  cranial nerve II-XII are grossly intact, no focal motor deficit, sensory intact to light touch bilaterally.  Skin: Warm, dry and intact. No rash, cyanosis, or clubbing.  Psychiatric: Patient is withdrawn and quiet. He answers only with with one or 2 words. His affect is relatively flat. Cognition appears decreased.  Assessment & Plan:

## 2011-09-04 NOTE — Assessment & Plan Note (Signed)
[name] Branch is borderline tachycardic in the office today. In chart review, he has a problem of cardiomyopathy, he additionally has had weight loss and exophthalmos on exam. While his caregiver states this has been present since birth the concern is for potential thyroid dysfunction. We will check CBC and TSH today. If they're abnormal we will have Mr. Lona followup in clinic, otherwise he will be seen in one year for routine hypercholesterolemia management

## 2011-09-04 NOTE — Assessment & Plan Note (Signed)
[name] Branch is doing quite well today. With his great strides in losing weight and exercise, I think that his lipid panel will be improved. Regardless since it has been approximately 2 years we will check lipid panel today. We'll check CMP today as well.

## 2011-09-04 NOTE — Patient Instructions (Signed)
  If any of your lab results are abnormal, we will contact you by phone or send you a letter. If they are normal, we will not contact you, but will be happy to discuss them at your next clinic appointment.  Return to clinic to see your PCP in 1 year.  Please bring all your medications to your next clinic appointment.    Tachycardia, Nonspecific In adults, the heart normally beats between 60 and 100 times a minute. A heart rate over 100 is called tachycardia. When your heart beats too fast, it may not be able to pump enough blood to the rest of the body. CAUSES   Exercise or exertion.   Fever.   Pain or injury.   Infection.   Loss of fluid (dehydration).   Overactive thyroid.   Lack of red blood cells (anemia).   Anxiety.   Alcohol.   Heart arrhythmia.   Caffeine.   Tobacco products.   Diet pills.   Street drugs.   Heart disease.  SYMPTOMS  Palpitations (rapid or irregular heartbeat).   Dizziness.   Tiredness (fatigue).   Shortness of breath.  DIAGNOSIS  After an exam and taking a history, your caregiver may order:  Blood tests.   Electrocardiogram (EKG).   Heart monitor.  TREATMENT  Treatment will depend on the cause and potential for harm. It may include:  Intravenous (IV) replacement of fluids or blood.   Antidote or reversal medicines.   Changes in your present medicines.   Lifestyle changes.  HOME CARE INSTRUCTIONS   Get rest.   Drink enough water and fluids to keep your urine clear or pale yellow.   Avoid:   Caffeine.   Nicotine.   Alcohol.   Stress.   Chocolate.   Stimulants.   Only take medicine as directed by your caregiver.  SEEK IMMEDIATE MEDICAL CARE IF:   You have pain in your chest, upper arms, jaw, or neck.   You become weak, dizzy, or feel faint.   You have palpitations that will not go away.   You throw up (vomit), have diarrhea, or pass blood.   You look pale and your skin is cool and wet.  MAKE SURE  YOU:   Understand these instructions.   Will watch your condition.   Will get help right away if you are not doing well or get worse.  Document Released: 08/24/2004 Document Revised: 03/29/2011 Document Reviewed: 07/17/2005 [facility] Patient Information 2012 [place], [place].

## 2011-09-05 LAB — COMPLETE METABOLIC PANEL WITH GFR
ALT: 27 U/L (ref 0–53)
AST: 22 U/L (ref 0–37)
CO2: 23 mEq/L (ref 19–32)
Chloride: 107 mEq/L (ref 96–112)
GFR, Est African American: >89 mL/min
Sodium: 140 mEq/L (ref 135–145)
Total Bilirubin: 0.8 mg/dL (ref 0.3–1.2)
Total Protein: 6.8 g/dL (ref 6.0–8.3)

## 2011-12-04 ENCOUNTER — Encounter: Payer: Self-pay | Admitting: Internal Medicine

## 2011-12-04 ENCOUNTER — Ambulatory Visit (INDEPENDENT_AMBULATORY_CARE_PROVIDER_SITE_OTHER): Payer: Medicare Other | Admitting: Internal Medicine

## 2011-12-04 ENCOUNTER — Other Ambulatory Visit (INDEPENDENT_AMBULATORY_CARE_PROVIDER_SITE_OTHER): Payer: Medicare Other

## 2011-12-04 VITALS — BP 118/64 | HR 88 | Ht 76.0 in | Wt 196.0 lb

## 2011-12-04 DIAGNOSIS — K5909 Other constipation: Secondary | ICD-10-CM

## 2011-12-04 DIAGNOSIS — K5900 Constipation, unspecified: Secondary | ICD-10-CM

## 2011-12-04 LAB — RENAL FUNCTION PANEL
Albumin: 4.2 g/dL (ref 3.5–5.2)
BUN: 12 mg/dL (ref 6–23)
CO2: 28 mEq/L (ref 19–32)
Creatinine, Ser: 1 mg/dL (ref 0.4–1.5)
GFR: 103.78 mL/min (ref >60.00)

## 2011-12-04 MED ORDER — BISACODYL 5 MG PO TBEC: DELAYED_RELEASE_TABLET | ORAL | Status: DC

## 2011-12-04 MED ORDER — CITRATE OF MAGNESIA PO SOLN: ORAL | Status: DC

## 2011-12-04 MED ORDER — PSYLLIUM 58.6 % PO POWD: ORAL | Status: DC

## 2011-12-04 MED ORDER — MAGNESIUM HYDROXIDE 2400 MG/10ML PO SUSP: ORAL | Status: DC

## 2011-12-04 MED ORDER — POLYETHYLENE GLYCOL 3350 17 GM/SCOOP PO POWD: ORAL | Status: DC

## 2011-12-04 NOTE — Patient Instructions (Addendum)
We have given you prescriptions for the following: Metamucil, Miralax, Milk of Magnesia, Dulcolax and Magnesium Citrate Your physician has requested that you go to the basement for the following lab work before leaving today: Renal Profile, Magnesium Follow up with Dr Juanda Chance in 6 months. CC: Dr Quentin Ore

## 2011-12-04 NOTE — Progress Notes (Signed)
[name] Branch 15-Sep-1965 MRN [id number]   History of Present Illness:  This is a 46 year old African American male with a dilated colon resulting in colonic inertia. He has chronic constipation and a history of severe obstipation requiring hospitalization. He is on a strict bowel regimen, diet and exercise. He has lost over 100 pounds in the last few years. His last appointment was in September 2012. He has continued on Metamucil one tablespoon twice a day, MiraLax 1.5 caps twice a day, milk of magnesia 45 cc twice a day, Dulcolax 2 tablets every morning and magnesium citrate one bottle on Mondays and Thursdays. He has no complaints.   Past Medical History  Diagnosis Date  . Colonic inertia   . Hyperlipidemia   . Abscess of anal and rectal regions   . OSA (obstructive sleep apnea)   . Hypertension   . Constipation   . Sarcoidosis   . 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 Schizophrenia   . Mental retardation   . Cardiomyopathy   . Glaucoma    Past Surgical History  Procedure Date  . Groin surgery     reports that he has never smoked. He has never used smokeless tobacco. He reports that he does not drink alcohol or use illicit drugs. family history includes Diabetes in his mother.  There is no history of Colon cancer. No Known Allergies      Review of Systems: Denies dysphagia chest pain shortness of breath  The remainder of the 10 point ROS is negative except as outlined in H&P   Physical Exam: General appearance  Well developed, in no distress. He is mentally retarded Eyes- non icteric. HEENT nontraumatic, normocephalic. Mouth no lesions, tongue papillated, no cheilosis. Neck supple without adenopathy, thyroid not enlarged, no carotid bruits, no JVD. Lungs Clear to auscultation bilaterally. Cor normal S1, normal S2, regular rhythm, no murmur,  quiet precordium. Abdomen: Protuberant, soft, nontender. Normal active bowel sounds. Probable stool in left side of the  colon. Rectal: Normal rectal sphincter tone. Large amount of soft Hemoccult negative stool. Extremities no pedal edema. Skin no lesions. Neurological alert and oriented x 3. Psychological normal mood and affect.  Assessment and Plan:   Problem #1 Colonic inertia. Patient is doing well on his current bowel regimen. We will check his potassium and magnesium today. We will refill all his prescriptions. He will continue on his exercise program and dietary modifications. We will see him in 6 months.  12/04/2011 Lina Sar

## 2011-12-07 DIAGNOSIS — H401190 Primary open-angle glaucoma, unspecified eye, stage unspecified: Secondary | ICD-10-CM | POA: Insufficient documentation

## 2012-05-20 ENCOUNTER — Other Ambulatory Visit: Payer: Self-pay | Admitting: Internal Medicine

## 2012-09-02 ENCOUNTER — Ambulatory Visit (INDEPENDENT_AMBULATORY_CARE_PROVIDER_SITE_OTHER): Payer: Medicare Other | Admitting: Internal Medicine

## 2012-09-02 ENCOUNTER — Encounter: Payer: Self-pay | Admitting: Internal Medicine

## 2012-09-02 VITALS — BP 110/73 | HR 94 | Temp 96.9°F | Ht 74.0 in | Wt 189.7 lb

## 2012-09-02 DIAGNOSIS — K5900 Constipation, unspecified: Secondary | ICD-10-CM

## 2012-09-02 DIAGNOSIS — I10 Essential (primary) hypertension: Secondary | ICD-10-CM

## 2012-09-02 DIAGNOSIS — Z23 Encounter for immunization: Secondary | ICD-10-CM

## 2012-09-02 DIAGNOSIS — G4733 Obstructive sleep apnea (adult) (pediatric): Secondary | ICD-10-CM

## 2012-09-02 DIAGNOSIS — E785 Hyperlipidemia, unspecified: Secondary | ICD-10-CM

## 2012-09-02 DIAGNOSIS — H409 Unspecified glaucoma: Secondary | ICD-10-CM

## 2012-09-02 DIAGNOSIS — F209 Schizophrenia, unspecified: Secondary | ICD-10-CM

## 2012-09-02 LAB — COMPLETE METABOLIC PANEL WITH GFR
ALT: 18 U/L (ref 0–53)
AST: 21 U/L (ref 0–37)
Alkaline Phosphatase: 85 U/L (ref 39–117)
CO2: 25 mEq/L (ref 19–32)
Creat: 1.01 mg/dL (ref 0.50–1.35)
GFR, Est African American: >89 mL/min
Sodium: 139 mEq/L (ref 135–145)
Total Bilirubin: 0.8 mg/dL (ref 0.3–1.2)
Total Protein: 6.8 g/dL (ref 6.0–8.3)

## 2012-09-02 LAB — LIPID PANEL
HDL: 53 mg/dL (ref >39)
LDL Cholesterol: 129 mg/dL — ABNORMAL HIGH (ref 0–99)
VLDL: 10 mg/dL (ref 0–40)

## 2012-09-02 MED ORDER — PRAVASTATIN SODIUM 40 MG PO TABS: 40.0000 mg | ORAL_TABLET | Freq: Every day | ORAL | Status: DC

## 2012-09-02 MED ORDER — MULTIVITAMINS PO CAPS: 1.0000 | ORAL_CAPSULE | Freq: Every day | ORAL | Status: DC

## 2012-09-02 NOTE — Assessment & Plan Note (Signed)
BP well controlled today on no medications.

## 2012-09-02 NOTE — Assessment & Plan Note (Addendum)
Last LDL 128 a year ago.   Recheck Lipid panel today.   Continue pravastatin, will call patient if changes needed.   Update: LDL stable at 129, no change to therapy.  CMP within normal limits except for mildly low glucose (66)

## 2012-09-02 NOTE — Progress Notes (Signed)
  Subjective:    Patient ID: [name] Branch, male    DOB: 1966/06/05, 47 y.o.   MRN: [id number]  CC: Routine follow up.   HPI  [name] Branch presents with a caretaker.  She does most of the talking for the patient.  Since last being seen, there has been no change in the patients medical history. He has been taking his prescriptions as prescribed without issue.  He lives in an ALF and has not missed any doses of his medications.  He has not had any muscle pain, weakness, change in urine.   He has further history of moderate MR and psychotic explosive disorder, this has been inaccurately charted as "schizophrenia" up to now and will be updated today.  She sees Dr. Bella Kennedy at [facility] for these issues and this provider fills his psychoactive medications.    He also has a history of colonic inertia and constipation for which he sees GI as well as glaucoma.   He has no further complaints and denies all ROS symptoms (see below)  Requests a flu shot today.   His medications are as follows, and they are updated in Epic - pravastatin, clozapine, fluvoxamine, bisacodyl, magnesium citrate, magnesium hydroxide, Miralax, MVI, psyllium, travaprost.   He further has problems listed as HTN and NICM; however, his caretaker reports that he was treated for these "a long time ago" and does not take medications for them now.   Review of Systems  Constitutional: Negative for activity change, appetite change and fatigue.  HENT: Negative for hearing loss and ear pain.   Eyes: Negative for pain and visual disturbance.  Respiratory: Negative for cough, shortness of breath and wheezing.   Cardiovascular: Negative for chest pain, palpitations and leg swelling.  Gastrointestinal: Positive for constipation. Negative for nausea, vomiting and diarrhea.  Genitourinary: Negative for dysuria and difficulty urinating.  Musculoskeletal: Negative for joint swelling, arthralgias and gait problem.  Skin: Negative  for rash and wound.  Neurological: Negative for dizziness, weakness and light-headedness.  Psychiatric/Behavioral: Negative for behavioral problems and agitation.       Objective:   Physical Exam  Constitutional: He is oriented to person, place, and time. He appears well-developed and well-nourished. No distress.  HENT:  Head: Normocephalic and atraumatic.  Eyes: EOM are normal. Right eye exhibits no discharge. Left eye exhibits no discharge. No scleral icterus.       Somewhat proptotic, but no issues closing eyelids.  This is stable per caretaker.  Pinpoint pupils, responsive.   Cardiovascular: Normal rate, regular rhythm and normal heart sounds.   Pulmonary/Chest: Effort normal and breath sounds normal. No respiratory distress. He has no wheezes.  Abdominal: Soft. Bowel sounds are normal. He exhibits no distension.  Musculoskeletal: He exhibits no edema and no tenderness.  Neurological: He is alert and oriented to person, place, and time.  Skin: Skin is warm and dry. He is not diaphoretic. No erythema.  Psychiatric:       He is somewhat quiet, answering questions when directly asked.  Affect is normal, mood appears stable and behavior was normal.     Labs: Lipid panel and CMP pending.     Assessment & Plan:  RTC in 1 year, sooner if needed

## 2012-09-02 NOTE — Assessment & Plan Note (Signed)
Stable on current CPAP.  Needs mask order to [facility]

## 2012-09-02 NOTE — Assessment & Plan Note (Signed)
Takes eye drops without issue.  No acute change in vision noted today.

## 2012-09-02 NOTE — Patient Instructions (Addendum)
Please schedule a follow up visit within the next year.   For your medications:   Please bring all of your pill  Bottles with you to each visit.  This will help make sure that we have an up to date list of all the medications you are taking.  Please also bring any over the counter herbal medications you are taking (not including advil, tylenol, etc.)  Please continue taking all of your medications as prescribed.  I will refill your pravastatin and multivitamin today.   [facility] will [facility] your masks for your sleep apnea (CPAP machine).   Please let us know if you need anything before we see you in clinic again.   You will have bloodwork to check your cholesterol today.  I will call you if your medications need to be changed.   Thank you!

## 2012-09-02 NOTE — Assessment & Plan Note (Signed)
Taking multiple medications, but symptoms relatively well controlled.  Sees Dr. Juanda Chance in GI.

## 2012-09-10 ENCOUNTER — Ambulatory Visit (INDEPENDENT_AMBULATORY_CARE_PROVIDER_SITE_OTHER): Payer: Medicare Other | Admitting: Internal Medicine

## 2012-09-10 ENCOUNTER — Encounter: Payer: Self-pay | Admitting: Internal Medicine

## 2012-09-10 VITALS — BP 122/78 | HR 120 | Ht 74.0 in | Wt 188.1 lb

## 2012-09-10 DIAGNOSIS — K592 Neurogenic bowel, not elsewhere classified: Secondary | ICD-10-CM

## 2012-09-10 MED ORDER — CITRATE OF MAGNESIA PO SOLN: ORAL | Status: DC

## 2012-09-10 MED ORDER — MOVIPREP 100 G PO SOLR: 1.0000 | Freq: Once | ORAL | Status: DC

## 2012-09-10 MED ORDER — BISACODYL 5 MG PO TBEC: DELAYED_RELEASE_TABLET | ORAL | Status: DC

## 2012-09-10 MED ORDER — POLYETHYLENE GLYCOL 3350 17 GM/SCOOP PO POWD: ORAL | Status: DC

## 2012-09-10 MED ORDER — PSYLLIUM 58.6 % PO POWD: ORAL | Status: DC

## 2012-09-10 MED ORDER — MAGNESIUM HYDROXIDE 2400 MG/10ML PO SUSP: ORAL | Status: DC

## 2012-09-10 MED ORDER — ENSURE COMPLETE SHAKE PO LIQD: 1.0000 | Freq: Two times a day (BID) | ORAL | Status: DC

## 2012-09-10 NOTE — Progress Notes (Signed)
[name] Branch January 09, 1966 MRN [id number]   History of Present Illness:  This is a 46 year old African American male with colonic inertia, dilated colon and chronic constipation. He is on a laxative regimen, diet and exercise. Since his last appointment 8 months ago, he has lost about 8 pounds. He takes Metamucil 1 tablespoon twice a day, MiraLax 1.5 capfuls twice a day, milk of magnesia 45 cc twice a day, Dulcolax tablets 2 every morning, magnesium citrate one bottle twice a week. He has a small bowel movement every day. An attempted colonoscopy in June 2007 showed a redundant dilated colon which was only partially clean. The procedure was terminated at the hepatic flexure. He denies rectal bleeding or abdominal pain. Patient is mentally retarded. Other medical problems include sarcoidosis and splenomegaly.   Past Medical History  Diagnosis Date  . Colonic inertia   . Hyperlipidemia   . Abscess of anal and rectal regions   . OSA (obstructive sleep apnea)   . Hypertension   . Constipation   . Sarcoidosis   . 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 Schizophrenia   . Mental retardation   . Cardiomyopathy   . Glaucoma(365)    Past Surgical History  Procedure Laterality Date  . Groin surgery      reports that he has never smoked. He has never used smokeless tobacco. He reports that he does not drink alcohol or use illicit drugs. family history includes Diabetes in his mother.  There is no history of Colon cancer. No Known Allergies      Review of Systems: Denies abdominal pain dysphagia heartburn  The remainder of the 10 point ROS is negative except as outlined in H&P   Physical Exam: General appearance  Well developed, in no distress. In Eyes- non icteric. HEENT nontraumatic, normocephalic. Mouth no lesions, tongue papillated, no cheilosis. Neck supple without adenopathy, thyroid not enlarged, no carotid bruits, no JVD. Lungs Clear to auscultation bilaterally. Cor normal S1, normal  S2, regular rhythm, no murmur,  quiet precordium. Abdomen: Mildly protuberant abdomen with a firm mass in the abdomen consistent with stool impaction. It's nontender and is located through the right middle and left colon. There is no ascites. Bowel sounds are normal. Rectal: Large amount of impacted stool which is Hemoccult negative. Extremities no pedal edema. Skin no lesions. Neurological alert and oriented x 3. Psychological normal mood and affect.  Assessment and Plan:  Problem #1 Chronic stool impaction due to a dilated megacolon and colonic inertia. We will increase his MiraLax to 2 capfuls twice a day. We will give him a moviprep in addition to his usual daily laxatives. He had his electrolytes checked recently. He may be due for a colonoscopy but he is almost impossible to clean out. He would have to be hospitalized and put on liquids for one weeks time. Currently, he is very thin and nutrition We will increase his food intake by adding Ensure 1 can twice a dayby prescription   09/10/2012 Lina Sar

## 2012-09-10 NOTE — Patient Instructions (Addendum)
We have given you prescriptions for the following medications to take to your pharmacy: Dulcolax, Magnesium Citrate, Milk of Magnesia, Miralax, Metamucil, Ensure, Coupon for a Movi Prep  CC: Dr Debe Coder

## 2013-05-14 ENCOUNTER — Encounter: Payer: Self-pay | Admitting: Internal Medicine

## 2013-05-22 ENCOUNTER — Ambulatory Visit: Payer: Medicare Other | Admitting: Gastroenterology

## 2013-05-27 ENCOUNTER — Ambulatory Visit: Payer: Medicare Other | Admitting: Gastroenterology

## 2013-06-16 ENCOUNTER — Telehealth: Payer: Self-pay | Admitting: Internal Medicine

## 2013-06-16 DIAGNOSIS — G4733 Obstructive sleep apnea (adult) (pediatric): Secondary | ICD-10-CM

## 2013-06-16 NOTE — Telephone Encounter (Signed)
Rec'd message via e mail from [facility] [phone] x [phone].  Patient's care giver is requesting a written order for a replacement unit.  Per patient's care giver last CPAP machine was in 1995 and it has now stopped working.  Per [facility] has advised the patient that they can issue him a loaner but an order  for an auto x 30 days or something to get the patient something to use until they can get a new Sleep Study done.   Please advise if this patient requires an appointment or a referral for an update Sleep Study referral for the new  CPAP machine.

## 2013-06-17 NOTE — Telephone Encounter (Signed)
Pt's caregiver calls this am requesting that pt's sleep study order be specified to home sleep study, it will be impossible for him to leave his home for this

## 2013-06-17 NOTE — Telephone Encounter (Signed)
Have ordered the CPAP machine as autotitration for 30 days.  Have also ordered the sleep study as a split night at home.  Please let me know what else I can do!  If these orders do not work, please ask the responder how they should be changed.   Thanks - -

## 2013-06-17 NOTE — Telephone Encounter (Signed)
We can just do a referral for the new sleep study, given his history and use of CPAP now for a decade.  As for the written order for a loaner unit, please just let me know what paperwork/Rx I need to write/sign and I will come do it this afternoon, I am not aware of exactly what to put on the order, so if specific wording is needed, could you please discover that?.  Thanks - -   EBM

## 2013-06-18 NOTE — Telephone Encounter (Signed)
Rx for autotitration faxed to [facility]. And order for sleep study faxed to [facility].

## 2013-06-24 ENCOUNTER — Telehealth: Payer: Self-pay | Admitting: Internal Medicine

## 2013-06-24 ENCOUNTER — Other Ambulatory Visit (INDEPENDENT_AMBULATORY_CARE_PROVIDER_SITE_OTHER): Payer: Medicare Other

## 2013-06-24 ENCOUNTER — Encounter: Payer: Self-pay | Admitting: Internal Medicine

## 2013-06-24 ENCOUNTER — Ambulatory Visit (INDEPENDENT_AMBULATORY_CARE_PROVIDER_SITE_OTHER): Payer: Medicare Other | Admitting: Internal Medicine

## 2013-06-24 VITALS — BP 124/84 | HR 80 | Ht 74.5 in | Wt 190.0 lb

## 2013-06-24 DIAGNOSIS — K599 Functional intestinal disorder, unspecified: Secondary | ICD-10-CM

## 2013-06-24 DIAGNOSIS — K592 Neurogenic bowel, not elsewhere classified: Secondary | ICD-10-CM

## 2013-06-24 DIAGNOSIS — K5989 Other specified functional intestinal disorders: Secondary | ICD-10-CM

## 2013-06-24 LAB — COMPREHENSIVE METABOLIC PANEL
Albumin: 4.1 g/dL (ref 3.5–5.2)
Alkaline Phosphatase: 87 U/L (ref 39–117)
BUN: 12 mg/dL (ref 6–23)
CO2: 30 mEq/L (ref 19–32)
Calcium: 9.1 mg/dL (ref 8.4–10.5)
Chloride: 107 mEq/L (ref 96–112)
GFR: 99.62 mL/min (ref >60.00)
Glucose, Bld: 85 mg/dL (ref 70–99)
Potassium: 3.9 mEq/L (ref 3.5–5.1)

## 2013-06-24 LAB — CBC WITH DIFFERENTIAL/PLATELET
Basophils Absolute: 0 10*3/uL (ref 0.0–0.1)
Basophils Relative: 0.2 % (ref 0.0–3.0)
Eosinophils Relative: 0.5 % (ref 0.0–5.0)
MCV: 91.1 fl (ref 78.0–100.0)
Monocytes Absolute: 0.5 10*3/uL (ref 0.1–1.0)
Neutrophils Relative %: 62.4 % (ref 43.0–77.0)
Platelets: 136 10*3/uL — ABNORMAL LOW (ref 150.0–400.0)
RBC: 4.35 Mil/uL (ref 4.22–5.81)
RDW: 14.4 % (ref 11.5–14.6)
WBC: 4.3 10*3/uL — ABNORMAL LOW (ref 4.5–10.5)

## 2013-06-24 MED ORDER — CITRATE OF MAGNESIA PO SOLN: ORAL | Status: DC

## 2013-06-24 MED ORDER — PSYLLIUM 58.6 % PO POWD: ORAL | Status: DC

## 2013-06-24 MED ORDER — MOVIPREP 100 G PO SOLR: 1.0000 | Freq: Once | ORAL | Status: DC

## 2013-06-24 MED ORDER — BISACODYL 5 MG PO TBEC: DELAYED_RELEASE_TABLET | ORAL | Status: DC

## 2013-06-24 MED ORDER — MAGNESIUM HYDROXIDE 2400 MG/10ML PO SUSP: ORAL | Status: DC

## 2013-06-24 MED ORDER — POLYETHYLENE GLYCOL 3350 17 GM/SCOOP PO POWD: ORAL | Status: DC

## 2013-06-24 NOTE — Progress Notes (Signed)
[name] Branch 1966/01/24 MRN [id number]  History of Present Illness:  This is a 47 year old African American male who is mentally retarded and lives in a group home. He has severe constipation due to colonic inertia. He is on a aggressive laxative regimen which includes Metamucil 1 tablespoon twice a day, MiraLax one half capful twice a day, magnesium citrate one bottle twice a week, milk of magnesia 45 cc twice a day and dulcolax 2 tablets daily. He is having daily small stools. A colonoscopy was attempted in June 2007 but he had a redundant and dilated colon which was not prepped. He spent several days in the hospital while we tried to prep him, but it was not completely successful. His last appointment was in February 2014. His weight was 188 pounds and today it is 190 pounds.   Past Medical History  Diagnosis Date  . Colonic inertia   . Hyperlipidemia   . Abscess of anal and rectal regions   . OSA (obstructive sleep apnea)   . Hypertension   . Constipation   . Sarcoidosis   . 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 Schizophrenia   . Mental retardation   . Cardiomyopathy   . Glaucoma    Past Surgical History  Procedure Laterality Date  . Groin surgery      reports that he has never smoked. He has never used smokeless tobacco. He reports that he does not drink alcohol or use illicit drugs. family history includes Diabetes in his mother. There is no history of Colon cancer. No Known Allergies     No complaints The remainder of the 10 point ROS is negative except as outlined in H&P   Physical Exam: General appearance  Well developed, in no distress. Eyes- non icteric. HEENT nontraumatic, normocephalic. Mouth no lesions, tongue papillated, no cheilosis. Neck supple without adenopathy, thyroid not enlarged, no carotid bruits, no JVD. Lungs Clear to auscultation bilaterally. Cor normal S1, normal S2, rapid  regular rhythm, no murmur,  quiet precordium. Abdomen: Protuberant with  palpable stool throughout the abdomen nontender. Normoactive bowel sounds.  Rectal:Large impaction at the tip of my finger. Hemoccult negative.  Extremities no pedal edema. Skin no lesions. Neurological alert and oriented x 3. Psychological normal mood and affect.  Assessment and Plan:  Problem #37 47 year old mentally retarted male with severe colonic inertia and chronic fecal impaction which is impossible to remove, It is likely partial concretion. He is on a very aggressive regimen of laxatives which we are going to modify today. We will increase his Mag citrate to 3 times a week, increase MiraLax to 2 capfuls twice a day and continue all other laxatives. We will check his electrolytes and CBC today. Eventually, he may need a colectomy and colostomy to avoid partial colon obstruction, but at this point he is not complaining of anything and he is having daily bowel movements.   06/24/2013 Lina Sar

## 2013-06-24 NOTE — Patient Instructions (Signed)
We have given you prescriptions for the following medications to take to your pharmacy: Miralax Magnesium citrate Dulcolax Metamucil Milk of Magnesia  Your physician has requested that you go to the basement for the following lab work before leaving today: CBC, CMET  CC: Dr Criselda Peaches

## 2013-06-24 NOTE — Telephone Encounter (Signed)
rx given to patient's caretaker.

## 2013-06-25 ENCOUNTER — Other Ambulatory Visit: Payer: Self-pay | Admitting: *Deleted

## 2013-06-25 ENCOUNTER — Telehealth: Payer: Self-pay | Admitting: *Deleted

## 2013-06-25 DIAGNOSIS — D696 Thrombocytopenia, unspecified: Secondary | ICD-10-CM

## 2013-06-25 NOTE — Telephone Encounter (Signed)
Pt's call friend, Francene Boyers. - asking for order for Sleep study, to be done at home. [facility] called [facility] - order already in their workqueue. She stated w/Medicare/Medicaid, it has to be done in their lab or they will not pay. She will call the pt and schedule the appt. I called Scarlette Calico back and made her awared of the above; she understands.

## 2013-06-30 NOTE — Telephone Encounter (Signed)
Appt has been schedule on 12/10 per EPIC.

## 2013-07-02 ENCOUNTER — Other Ambulatory Visit: Payer: Self-pay | Admitting: *Deleted

## 2013-07-02 ENCOUNTER — Telehealth: Payer: Self-pay | Admitting: *Deleted

## 2013-07-02 ENCOUNTER — Ambulatory Visit (HOSPITAL_COMMUNITY)
Admission: RE | Admit: 2013-07-02 | Discharge: 2013-07-02 | Disposition: A | Discharge status: Home / Self Care | Payer: Medicare Other | Source: Ambulatory Visit | Attending: Internal Medicine | Admitting: Internal Medicine

## 2013-07-02 ENCOUNTER — Other Ambulatory Visit (HOSPITAL_COMMUNITY): Payer: Medicare Other

## 2013-07-02 DIAGNOSIS — D696 Thrombocytopenia, unspecified: Secondary | ICD-10-CM

## 2013-07-02 DIAGNOSIS — R161 Splenomegaly, not elsewhere classified: Secondary | ICD-10-CM | POA: Insufficient documentation

## 2013-07-02 DIAGNOSIS — K5901 Slow transit constipation: Secondary | ICD-10-CM | POA: Insufficient documentation

## 2013-07-02 MED ORDER — ENSURE COMPLETE SHAKE PO LIQD: 1.0000 | Freq: Two times a day (BID) | ORAL | Status: DC

## 2013-07-02 NOTE — Telephone Encounter (Signed)
OK to give Miralax prep.

## 2013-07-02 NOTE — Telephone Encounter (Signed)
Dr Juanda Chance, can I have patient to Miralax purge instead of Moviprep? I gave a free prep coupon to patient's caregiver but she threw it away because she didn't think it was good anymore. I have no more free preps to give out of the Moviprep.

## 2013-07-02 NOTE — Telephone Encounter (Signed)
[name] Branch patient's caregiver is asking for Moviprep coupon because the one she had is expired.

## 2013-07-03 MED ORDER — BISACODYL 5 MG PO TBEC: DELAYED_RELEASE_TABLET | ORAL | Status: DC

## 2013-07-03 MED ORDER — POLYETHYLENE GLYCOL 3350 17 GM/SCOOP PO POWD: ORAL | Status: DC

## 2013-07-03 NOTE — Telephone Encounter (Signed)
I have spoken to patient's caregiver, Scarlette Calico. I advised that the Moviprep coupon was actually still redeemable. She already threw it away. I have advised that Dr Juanda Chance is okay with giving a Miralax prep. I have given her verbal instructions on the prep and will send her a written copy as well. Rx's will be sent to [facility] as well. She will hold off on doing the prep until later as patient is currently having better bowel movements. I have asked her to call back if she has questions.

## 2013-07-03 NOTE — Addendum Note (Signed)
Addended by: Richardson Chiquito on: 07/03/2013 09:00 AM   Modules accepted: Orders

## 2013-07-09 ENCOUNTER — Ambulatory Visit (HOSPITAL_BASED_OUTPATIENT_CLINIC_OR_DEPARTMENT_OTHER): Payer: Medicare Other | Attending: Internal Medicine

## 2013-07-09 VITALS — Ht 73.0 in | Wt 190.0 lb

## 2013-07-09 DIAGNOSIS — G4733 Obstructive sleep apnea (adult) (pediatric): Secondary | ICD-10-CM

## 2013-07-09 DIAGNOSIS — R0609 Other forms of dyspnea: Secondary | ICD-10-CM | POA: Insufficient documentation

## 2013-07-09 DIAGNOSIS — G4710 Hypersomnia, unspecified: Secondary | ICD-10-CM | POA: Insufficient documentation

## 2013-07-09 DIAGNOSIS — R0989 Other specified symptoms and signs involving the circulatory and respiratory systems: Secondary | ICD-10-CM | POA: Insufficient documentation

## 2013-07-19 DIAGNOSIS — G4733 Obstructive sleep apnea (adult) (pediatric): Secondary | ICD-10-CM

## 2013-07-19 NOTE — Sleep Study (Signed)
   NAME: [name] Branch DATE OF BIRTH:  1966/06/30 MEDICAL RECORD NUMBER [id number]  LOCATION: [facility]  PHYSICIAN: YOUNG,CLINTON D  DATE OF STUDY: 07/09/2013  SLEEP STUDY TYPE: Nocturnal Polysomnogram               REFERRING PHYSICIAN: Inez Catalina, MD  INDICATION FOR STUDY: Hypersomnia with sleep apnea  EPWORTH SLEEPINESS SCORE:   9/24 HEIGHT: 6\' 1"  (185.4 cm)  WEIGHT: 190 lb (86.183 kg)    Body mass index is 25.07 kg/(m^2).  NECK SIZE: 15 in.  MEDICATIONS: Charted for review  SLEEP ARCHITECTURE: Total sleep time 339.5 minutes with sleep efficiency 84.8%. Stage I was 6.5%, stage II 74.1%, stage III absent, REM 19.4% of total sleep time. Sleep latency 10.5 minutes, REM latency 72.5 minutes, awake after sleep onset 8.5 minutes, arousal index 9.0. Bedtime medication: None  RESPIRATORY DATA: Apnea hypopnea index (AHI) 2.8 per hour. A total of 16 events were scored including 2 obstructive apneas and 14 hypoxemia is. All events were recorded while nonsupine. REM AHI 10 per hour. There were not enough events for split protocol CPAP titration.  OXYGEN DATA: Moderate snoring with oxygen desaturation to a nadir of 83% and mean oxygen saturation through the study of 96.6% on room air.  CARDIAC DATA: Sinus rhythm with PACs  MOVEMENT/PARASOMNIA: No significant movement disturbance. The bathroom x1.  IMPRESSION/ RECOMMENDATION:   1) occasional respiratory events with sleep disturbance, within normal limits. AHI 2.8 per hour (the normal range for adults is an AHI between 0 and 5 events per hour). Moderate snoring with oxygen desaturation to a nadir of 83% and mean oxygen saturation through the study of 96.6% on room air. There were not enough events for CPAP titration.   Waymon Budge Diplomate, American Board of Sleep Medicine  Signed Jetty Duhamel M.D. ELECTRONICALLY SIGNED ON:  07/19/2013, 1:27 PM [facility] PH: [phone]   FX:  [id number] ACCREDITED BY THE AMERICAN ACADEMY OF SLEEP MEDICINE

## 2013-07-21 ENCOUNTER — Telehealth: Payer: Self-pay | Admitting: *Deleted

## 2013-07-21 ENCOUNTER — Telehealth: Payer: Self-pay | Admitting: Internal Medicine

## 2013-07-21 ENCOUNTER — Ambulatory Visit (INDEPENDENT_AMBULATORY_CARE_PROVIDER_SITE_OTHER): Payer: Medicare Other | Admitting: Internal Medicine

## 2013-07-21 ENCOUNTER — Encounter: Payer: Self-pay | Admitting: Internal Medicine

## 2013-07-21 VITALS — BP 115/69 | HR 92 | Temp 98.0°F | Ht 74.0 in | Wt 170.2 lb

## 2013-07-21 DIAGNOSIS — E785 Hyperlipidemia, unspecified: Secondary | ICD-10-CM

## 2013-07-21 DIAGNOSIS — G4733 Obstructive sleep apnea (adult) (pediatric): Secondary | ICD-10-CM

## 2013-07-21 DIAGNOSIS — Z23 Encounter for immunization: Secondary | ICD-10-CM

## 2013-07-21 DIAGNOSIS — I10 Essential (primary) hypertension: Secondary | ICD-10-CM

## 2013-07-21 MED ORDER — PRAVASTATIN SODIUM 40 MG PO TABS: 40.0000 mg | ORAL_TABLET | Freq: Every day | ORAL | Status: DC

## 2013-07-21 MED ORDER — MULTIVITAMINS PO CAPS: 1.0000 | ORAL_CAPSULE | Freq: Every day | ORAL | Status: DC

## 2013-07-21 NOTE — Telephone Encounter (Signed)
Glenda - -  Would a D/C order just be a letter?  I don't know that there is a specific order in epic that I can just discontinue.  Please advise.  Thanks!  EBM

## 2013-07-21 NOTE — Progress Notes (Signed)
Patient ID: [name] Branch, male   DOB: [birth year], 47 y.o.   MRN: [id number]   Subjective:   Patient ID: [name] Branch male   DOB: Sep 04, 1965 47 y.o.   MRN: [id number]  HPI: [name] is a 47 y.o. man with history of HTN, HL, OSA, colonic inertia and constipation (followed by GI), glaucoma (followed by ophtho), psychotic explosive disorder (follwed by [facility]) who presents for medication refill and CPAP renewal order.  Patient's caregiver present and provided history.   Patient is on many medications but only pravastatin and multivitamin prescribed by [facility].  He needs printed refills of both today.  He lives in an ALF and takes all medications as prescribed.   Patient has been using CPAP for years though his current order is expiring per [facility].  He had a repeat sleep study on 07/09/13 (Dr. Maple Hudson) which showed occasional respiratory events, AHI 2.8 (wnl), moderate snoring with average O2 sat 96.6% on RA; no enough events for CPAP titration.  Patient needs new CPAP order so he can continue using CPAP and receiving CPAP supplies.   Otherwise, he is doing well and no complaints.  He follows with GI, psychiatry, and opthalmology for his other medical issues.   Pan-negative ROS (see below).  Patient would like a flu shot today.    Past Medical History  Diagnosis Date  . Colonic inertia   . Hyperlipidemia   . Abscess of anal and rectal regions   . OSA (obstructive sleep apnea)   . Hypertension   . Constipation   . Sarcoidosis   . 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 Schizophrenia   . Mental retardation   . Cardiomyopathy   . Glaucoma    Current Outpatient Prescriptions  Medication Sig Dispense Refill  . bisacodyl (DULCOLAX) 5 MG EC tablet Take 2 capsules by mouth every morning  60 tablet  10  . cloZAPine (CLOZARIL) 100 MG tablet Take 125 mg by mouth 2 (two) times daily.       . fluvoxaMINE (LUVOX) 100 MG tablet Take 300 mg by mouth at bedtime.      . magnesium  citrate SOLN Take 1 bottle by mouth three times per week  12 Bottle  10  . Magnesium Hydroxide (MILK OF MAGNESIA CONCENTRATE) 2400 MG/10ML SUSP Take 45 cc (3 tablespoons) by mouth twice daily.  2700 mL  10  . Multiple Vitamin (MULTIVITAMIN) capsule Take 1 capsule by mouth daily.  30 capsule  11  . Nutritional Supplements (ENSURE COMPLETE SHAKE) LIQD Take 1 Can by mouth 2 (two) times daily.  60 Bottle  prn  . polyethylene glycol powder (GLYCOLAX/MIRALAX) powder Dissolve 2 capful (34 grams) in at least 16 ounces water/juice and drink twice daily  1020 g  10  . pravastatin (PRAVACHOL) 40 MG tablet Take 1 tablet (40 mg total) by mouth daily.  30 tablet  11  . psyllium (METAMUCIL) 58.6 % powder Take 1 tablespoon dissolved in at least 8 ounces water/juice and drink twice daily.  900 g  10  . travoprost, benzalkonium, (TRAVATAN) 0.004 % ophthalmic solution 1 drop at bedtime.         No current facility-administered medications for this visit.   Family History  Problem Relation Age of Onset  . Diabetes Mother   . Colon cancer Neg Hx    History   Social History  . Marital Status: Single    Spouse Name: N/A    Number of Children: 0  .  Years of Education: N/A   Occupational History  .     Social History Main Topics  . Smoking status: Never Smoker   . Smokeless tobacco: Never Used  . Alcohol Use: No  . Drug Use: No  . Sexual Activity: None     Comment: has a caretaker, on aggressive colon regimen, often gets constipation,    Other Topics Concern  . None   Social History Narrative   Has a caretaker   Review of Systems: Review of Systems  Constitutional: Negative for fever, chills, weight loss and malaise/fatigue.  Respiratory: Negative for cough and shortness of breath.   Cardiovascular: Negative for chest pain, palpitations and leg swelling.  Gastrointestinal: Negative for heartburn, nausea, vomiting, abdominal pain, diarrhea, constipation and blood in stool.  Genitourinary:  Negative for dysuria.  Musculoskeletal: Negative for back pain, falls and joint pain.  Neurological: Negative for dizziness, loss of consciousness and headaches.    Objective:  Physical Exam: Filed Vitals:   07/21/13 0822  BP: 115/69  Pulse: 92  Temp: 98 F (36.7 C)  TempSrc: Oral  Height: 6\' 2"  (1.88 m)  Weight: 170 lb 3.2 oz (77.202 kg)  SpO2: 100%   General: alert, cooperative, and in no apparent distress HEENT: NCAT, vision grossly intact, oropharynx clear and non-erythematous  Neck: supple, no lymphadenopathy Lungs: clear to ascultation bilaterally, normal work of respiration, no wheezes, rales, ronchi Heart: regular rate and rhythm, no murmurs, gallops, or rubs Abdomen: soft, non-tender, non-distended, normal bowel sounds Extremities: 2+ DP/PT pulses bilaterally, no cyanosis, clubbing, or edema Neurologic: alert & oriented X3, cranial nerves II-XII intact, strength grossly intact, sensation intact to light touch  Assessment & Plan:  Patient discussed with Dr. Josem Kaufmann.  Please see problem-based assessment and plan.

## 2013-07-21 NOTE — Patient Instructions (Addendum)
Please follow-up with Dr. Criselda Peaches in 6 months.   We renewed your CPAP order and are looking into whether we need to fax any information to [facility] as well.   We are giving you refills on your pravastatin and multivitamin today.   You also got your flu shot!   Sleep Apnea Sleep apnea is disorder that affects a person's sleep. A person with sleep apnea has abnormal pauses in their breathing when they sleep. It is hard for them to get a good sleep. This makes a person tired during the day. It also can lead to other physical problems. There are three types of sleep apnea. One type is when breathing stops for a short time because your airway is blocked (obstructive sleep apnea). Another type is when the brain sometimes fails to give the normal signal to breathe to the muscles that control your breathing (central sleep apnea). The third type is a combination of the other two types. HOME CARE  Do not sleep on your back. Try to sleep on your side.  Take all medicine as told by your doctor.  Avoid alcohol, calming medicines (sedatives), and depressant drugs.  Try to lose weight if you are overweight. Talk to your doctor about a healthy weight goal. Your doctor may have you use a device that helps to open your airway. It can help you get the air that you need. It is called a positive airway pressure (PAP) device. There are three types of PAP devices:  Continuous positive airway pressure (CPAP) device.  Nasal expiratory positive airway pressure (EPAP) device.  Bilevel positive airway pressure (BPAP) device. MAKE SURE YOU:  Understand these instructions.  Will watch your condition.  Will get help right away if you are not doing well or get worse. Document Released: 04/25/2008 Document Revised: 07/03/2012 Document Reviewed: 11/18/2011 [facility] Patient Information 2014 [place], [place].

## 2013-07-21 NOTE — Progress Notes (Signed)
I saw and evaluated the patient.  I personally confirmed the key portions of Dr. Roger's history and exam and reviewed pertinent patient test results.  The assessment, diagnosis, and plan were formulated together and I agree with the documentation in the resident's note. 

## 2013-07-21 NOTE — Assessment & Plan Note (Signed)
Re-ordered CPAP today.  Chilon Boone contacting Dr. Roxy Cedar office for full sleep study report and will fax any further information required to [facility].

## 2013-07-21 NOTE — Telephone Encounter (Signed)
Rec'd e-mail from [facility], that the new sleep study that was submitted show' that patient's AHI is only 2.8 which would not qualify him for coverage fora new CPAP under Medicare or Medicaid (secondary insurance).  Right now the patient has a loaner machine that would need to be returned and if he would like a replacement CPAP, he would have to sign and ABN and private pat.  Approx cost at set up would be between $600-$700 and would have to be paid at the time he was set up.  Medicare will also no longer cover supplies since he no longer qualifies for PAP therapy.  RT support is to contact the patient and have the loaneequipment picked up. (Per Karle Plumber  And Pearletha Furl at [facility])  Also message from Haynes Kerns at [facility].  It does appear this is a diagnostic sleep study and does not meet edicare's requirements for AHI.  At this time if the patient moves froward with a new unit we would need ABN and patient would need to pay at the time of set up.  This will will not work per Clearnce Hasten Patient Referral Specialist II Referral Support) to qualify for payment anyway.  And now that we have a new study even if we found the old one, I feel this one trumps the older .  We will need to see if pt wishes to [facility] and then communicate back to MD.

## 2013-07-21 NOTE — Assessment & Plan Note (Signed)
Continue pravastatin 40 mg daily (LDL 129, CMP wnl in 08/2012).  Printed refill rx provided today.

## 2013-07-21 NOTE — Assessment & Plan Note (Signed)
BP wnl today on no medications.

## 2013-07-21 NOTE — Telephone Encounter (Signed)
Have given order for d/c of CPAP to Glenda.  Please let me know if there is anything else I should do.  Thank you.   EBM

## 2013-07-21 NOTE — Telephone Encounter (Signed)
Call to pt's caregiver - received memo from [facility]; states the new sleep study shows pt's AHI is only 2.8 which would not qualify him for coverage of a new CPAP under Medicare/Medicaid. She was informed out of pocket cost will be between $600-$700. She voiced understanding. For her records , she requests a d/c order (letter) mailed to her. Pt has a loaner at this time, states she will return it herself to [facility]. Thanks

## 2013-07-21 NOTE — Telephone Encounter (Signed)
Order mailed to Ms [name] Branch.

## 2013-07-21 NOTE — Telephone Encounter (Signed)
Have given order to [facility]

## 2013-12-29 ENCOUNTER — Other Ambulatory Visit: Payer: Self-pay | Admitting: Internal Medicine

## 2014-02-17 ENCOUNTER — Telehealth: Payer: Self-pay | Admitting: *Deleted

## 2014-02-17 NOTE — Telephone Encounter (Signed)
MS LEACH CALLED REQUESTING GI REFERRAL TO DR BRODIE FOR MR. Buerkle. PATIENT NEEDS TO COME IN FOR THIS REFERRAL.   MS LEACH WILL BE CONTACTED WITH APPOINTMENT FOR MR. Arvanitis. [phone]  / [phone].  NOTE TO SCHEDULERS TO SET UP THIS APPOINTMENT.  LELA STURDIVANT NTII 7-21-015 @  5:43PM

## 2014-02-25 ENCOUNTER — Encounter: Payer: Self-pay | Admitting: Internal Medicine

## 2014-02-25 ENCOUNTER — Ambulatory Visit (INDEPENDENT_AMBULATORY_CARE_PROVIDER_SITE_OTHER): Payer: Medicare Other | Admitting: Internal Medicine

## 2014-02-25 VITALS — BP 114/75 | HR 97 | Temp 97.6°F | Ht 74.0 in | Wt 185.2 lb

## 2014-02-25 DIAGNOSIS — E785 Hyperlipidemia, unspecified: Secondary | ICD-10-CM

## 2014-02-25 DIAGNOSIS — K5900 Constipation, unspecified: Secondary | ICD-10-CM

## 2014-02-25 DIAGNOSIS — I428 Other cardiomyopathies: Secondary | ICD-10-CM

## 2014-02-25 LAB — LIPID PANEL
CHOLESTEROL: 201 mg/dL — AB (ref 0–200)
HDL: 60 mg/dL (ref >39)
LDL Cholesterol: 130 mg/dL — ABNORMAL HIGH (ref 0–99)
TRIGLYCERIDES: 56 mg/dL (ref <150)
Total CHOL/HDL Ratio: 3.4 Ratio
VLDL: 11 mg/dL (ref 0–40)

## 2014-02-25 LAB — CBC
HEMATOCRIT: 41.7 % (ref 39.0–52.0)
HEMOGLOBIN: 14.2 g/dL (ref 13.0–17.0)
MCH: 29.8 pg (ref 26.0–34.0)
MCHC: 34.1 g/dL (ref 30.0–36.0)
MCV: 87.4 fL (ref 78.0–100.0)
Platelets: 152 10*3/uL (ref 150–400)
RBC: 4.77 MIL/uL (ref 4.22–5.81)
RDW: 13.9 % (ref 11.5–15.5)
WBC: 3.6 10*3/uL — ABNORMAL LOW (ref 4.0–10.5)

## 2014-02-25 NOTE — Progress Notes (Signed)
   Subjective:    Patient ID: [name] Branch, male    DOB: [birth year], 48 y.o.   MRN: [id number]  CC: Needs referral to GI  HPI  [name] is a 48yo man with PMH of sarcoidosis, HLD, psychotic explosive disorder, MR, OSA, Cardiomyopathy, HTN, constipation who presents for follow up.    [name] reports no acute complaints.  His caretaker is in the room with him and notes no complaints as well.  They have been told by Dr. Nichola Sizer office that they need a new referral to continue to see him for chronic constipation and neurogenic bowel .    [name] has a history of HLD for which he receives pravastatin from our clinic.  Lipid panel will be checked today.  He further has sarcoidosis, MR/psychiatriac disorder, cardiomyopathy all of which he sees specialists for and these are well controlled.     Review of Systems  Constitutional: Negative for fever and chills.  Gastrointestinal: Positive for constipation. Negative for blood in stool.  Genitourinary: Negative for dysuria and difficulty urinating.  Neurological: Negative for dizziness and light-headedness.       Objective:   Physical Exam  Constitutional: He is oriented to person, place, and time. He appears well-developed and well-nourished. No distress.  Eyes: No scleral icterus.  + proptotic, unchanged  Cardiovascular: Normal rate, regular rhythm and normal heart sounds.   Pulmonary/Chest: Effort normal and breath sounds normal. No respiratory distress.  Abdominal: Soft. Bowel sounds are normal. He exhibits no distension. There is no tenderness.  Neurological: He is alert and oriented to person, place, and time.  Skin: Skin is warm and dry.    Lipid panel and CBC today      Assessment & Plan:  F/U in 1 year, sooner if needed

## 2014-02-25 NOTE — Patient Instructions (Signed)
Please schedule a follow up visit within the next 1 year. .   For your medications:   Please bring all of your pill  Bottles with you to each visit.  This will help make sure that we have an up to date list of all the medications you are taking.  Please also bring any over the counter herbal medications you are taking (not including advil, tylenol, etc.)  Please continue taking all of your medications as prescribed.   Thank you!

## 2014-02-26 NOTE — Assessment & Plan Note (Signed)
Lipid panel today, continue pravastatin.

## 2014-02-26 NOTE — Assessment & Plan Note (Signed)
Referral to Dr. Nichola Sizer office made.

## 2014-04-05 ENCOUNTER — Inpatient Hospital Stay (HOSPITAL_COMMUNITY)
Admission: EM | Admit: 2014-04-05 | Discharge: 2014-04-10 | DRG: 871 | Disposition: A | Discharge status: Home / Self Care | Payer: Medicare Other | Attending: Internal Medicine | Admitting: Internal Medicine

## 2014-04-05 ENCOUNTER — Inpatient Hospital Stay (HOSPITAL_COMMUNITY): Payer: Medicare Other

## 2014-04-05 ENCOUNTER — Encounter (HOSPITAL_COMMUNITY): Payer: Self-pay | Admitting: Emergency Medicine

## 2014-04-05 ENCOUNTER — Emergency Department (HOSPITAL_COMMUNITY): Payer: Medicare Other

## 2014-04-05 DIAGNOSIS — N135 Crossing vessel and stricture of ureter without hydronephrosis: Secondary | ICD-10-CM | POA: Diagnosis present

## 2014-04-05 DIAGNOSIS — F209 Schizophrenia, unspecified: Secondary | ICD-10-CM | POA: Diagnosis present

## 2014-04-05 DIAGNOSIS — N179 Acute kidney failure, unspecified: Secondary | ICD-10-CM | POA: Diagnosis not present

## 2014-04-05 DIAGNOSIS — R197 Diarrhea, unspecified: Secondary | ICD-10-CM | POA: Diagnosis present

## 2014-04-05 DIAGNOSIS — I4510 Unspecified right bundle-branch block: Secondary | ICD-10-CM | POA: Diagnosis present

## 2014-04-05 DIAGNOSIS — R6510 Systemic inflammatory response syndrome (SIRS) of non-infectious origin without acute organ dysfunction: Secondary | ICD-10-CM

## 2014-04-05 DIAGNOSIS — I4891 Unspecified atrial fibrillation: Secondary | ICD-10-CM | POA: Diagnosis present

## 2014-04-05 DIAGNOSIS — N390 Urinary tract infection, site not specified: Secondary | ICD-10-CM | POA: Diagnosis present

## 2014-04-05 DIAGNOSIS — F79 Unspecified intellectual disabilities: Secondary | ICD-10-CM | POA: Diagnosis present

## 2014-04-05 DIAGNOSIS — G4733 Obstructive sleep apnea (adult) (pediatric): Secondary | ICD-10-CM | POA: Diagnosis present

## 2014-04-05 DIAGNOSIS — R933 Abnormal findings on diagnostic imaging of other parts of digestive tract: Secondary | ICD-10-CM

## 2014-04-05 DIAGNOSIS — E860 Dehydration: Secondary | ICD-10-CM | POA: Diagnosis present

## 2014-04-05 DIAGNOSIS — D696 Thrombocytopenia, unspecified: Secondary | ICD-10-CM | POA: Diagnosis present

## 2014-04-05 DIAGNOSIS — R6521 Severe sepsis with septic shock: Secondary | ICD-10-CM

## 2014-04-05 DIAGNOSIS — I10 Essential (primary) hypertension: Secondary | ICD-10-CM | POA: Diagnosis present

## 2014-04-05 DIAGNOSIS — D869 Sarcoidosis, unspecified: Secondary | ICD-10-CM | POA: Diagnosis present

## 2014-04-05 DIAGNOSIS — E785 Hyperlipidemia, unspecified: Secondary | ICD-10-CM | POA: Diagnosis present

## 2014-04-05 DIAGNOSIS — I498 Other specified cardiac arrhythmias: Secondary | ICD-10-CM | POA: Diagnosis present

## 2014-04-05 DIAGNOSIS — I959 Hypotension, unspecified: Secondary | ICD-10-CM

## 2014-04-05 DIAGNOSIS — E875 Hyperkalemia: Secondary | ICD-10-CM | POA: Diagnosis present

## 2014-04-05 DIAGNOSIS — I428 Other cardiomyopathies: Secondary | ICD-10-CM | POA: Diagnosis present

## 2014-04-05 DIAGNOSIS — R6520 Severe sepsis without septic shock: Secondary | ICD-10-CM | POA: Diagnosis present

## 2014-04-05 DIAGNOSIS — R232 Flushing: Secondary | ICD-10-CM | POA: Diagnosis present

## 2014-04-05 DIAGNOSIS — N39490 Overflow incontinence: Secondary | ICD-10-CM | POA: Diagnosis present

## 2014-04-05 DIAGNOSIS — Z79899 Other long term (current) drug therapy: Secondary | ICD-10-CM

## 2014-04-05 DIAGNOSIS — N1330 Unspecified hydronephrosis: Secondary | ICD-10-CM | POA: Diagnosis present

## 2014-04-05 DIAGNOSIS — K5649 Other impaction of intestine: Secondary | ICD-10-CM

## 2014-04-05 DIAGNOSIS — K592 Neurogenic bowel, not elsewhere classified: Secondary | ICD-10-CM | POA: Diagnosis present

## 2014-04-05 DIAGNOSIS — A419 Sepsis, unspecified organism: Secondary | ICD-10-CM | POA: Diagnosis not present

## 2014-04-05 DIAGNOSIS — K5900 Constipation, unspecified: Secondary | ICD-10-CM | POA: Diagnosis present

## 2014-04-05 LAB — CBC WITH DIFFERENTIAL/PLATELET
BASOS ABS: 0 10*3/uL (ref 0.0–0.1)
BASOS PCT: 0 % (ref 0–1)
Eosinophils Absolute: 0 10*3/uL (ref 0.0–0.7)
Eosinophils Relative: 0 % (ref 0–5)
HEMATOCRIT: 56.7 % — AB (ref 39.0–52.0)
HEMOGLOBIN: 19.8 g/dL — AB (ref 13.0–17.0)
LYMPHS ABS: 1.9 10*3/uL (ref 0.7–4.0)
LYMPHS PCT: 12 % (ref 12–46)
MCH: 31.2 pg (ref 26.0–34.0)
MCHC: 34.9 g/dL (ref 30.0–36.0)
MCV: 89.3 fL (ref 78.0–100.0)
MONOS PCT: 5 % (ref 3–12)
Monocytes Absolute: 0.8 10*3/uL (ref 0.1–1.0)
NEUTROS ABS: 13.1 10*3/uL — AB (ref 1.7–7.7)
Neutrophils Relative %: 83 % — ABNORMAL HIGH (ref 43–77)
Platelets: 161 10*3/uL (ref 150–400)
RBC: 6.35 MIL/uL — ABNORMAL HIGH (ref 4.22–5.81)
RDW: 15 % (ref 11.5–15.5)
WBC: 15.8 10*3/uL — AB (ref 4.0–10.5)

## 2014-04-05 LAB — COMPREHENSIVE METABOLIC PANEL
ALT: 27 U/L (ref 0–53)
AST: 65 U/L — ABNORMAL HIGH (ref 0–37)
Albumin: 3.9 g/dL (ref 3.5–5.2)
Alkaline Phosphatase: 99 U/L (ref 39–117)
Anion gap: 15 (ref 5–15)
BILIRUBIN TOTAL: 1.2 mg/dL (ref 0.3–1.2)
BUN: 43 mg/dL — ABNORMAL HIGH (ref 6–23)
CALCIUM: 9.3 mg/dL (ref 8.4–10.5)
CO2: 20 mEq/L (ref 19–32)
CREATININE: 1.73 mg/dL — AB (ref 0.50–1.35)
Chloride: 100 mEq/L (ref 96–112)
GFR calc non Af Amer: 45 mL/min — ABNORMAL LOW (ref >90)
GFR, EST AFRICAN AMERICAN: 52 mL/min — AB (ref >90)
Glucose, Bld: 106 mg/dL — ABNORMAL HIGH (ref 70–99)
Potassium: >7.7 mEq/L (ref 3.7–5.3)
Sodium: 135 mEq/L — ABNORMAL LOW (ref 137–147)
Total Protein: 7.3 g/dL (ref 6.0–8.3)

## 2014-04-05 LAB — BASIC METABOLIC PANEL
ANION GAP: 13 (ref 5–15)
BUN: 50 mg/dL — ABNORMAL HIGH (ref 6–23)
CALCIUM: 8.2 mg/dL — AB (ref 8.4–10.5)
CO2: 18 meq/L — AB (ref 19–32)
Chloride: 103 mEq/L (ref 96–112)
Creatinine, Ser: 1.99 mg/dL — ABNORMAL HIGH (ref 0.50–1.35)
GFR calc non Af Amer: 38 mL/min — ABNORMAL LOW (ref >90)
GFR, EST AFRICAN AMERICAN: 44 mL/min — AB (ref >90)
Glucose, Bld: 127 mg/dL — ABNORMAL HIGH (ref 70–99)
Potassium: >7.7 mEq/L (ref 3.7–5.3)
Sodium: 134 mEq/L — ABNORMAL LOW (ref 137–147)

## 2014-04-05 LAB — POC OCCULT BLOOD, ED: Fecal Occult Bld: POSITIVE — AB

## 2014-04-05 LAB — BLOOD GAS, ARTERIAL
ACID-BASE DEFICIT: 7.6 mmol/L — AB (ref 0.0–2.0)
Bicarbonate: 17 mEq/L — ABNORMAL LOW (ref 20.0–24.0)
Drawn by: 31101
FIO2: 0.21 %
O2 Saturation: 95.7 %
Patient temperature: 98.6
TCO2: 18 mmol/L (ref 0–100)
pCO2 arterial: 32.1 mmHg — ABNORMAL LOW (ref 35.0–45.0)
pH, Arterial: 7.344 — ABNORMAL LOW (ref 7.350–7.450)
pO2, Arterial: 88.9 mmHg (ref 80.0–100.0)

## 2014-04-05 LAB — I-STAT CG4 LACTIC ACID, ED
LACTIC ACID, VENOUS: 5.5 mmol/L — AB (ref 0.5–2.2)
Lactic Acid, Venous: 5.54 mmol/L — ABNORMAL HIGH (ref 0.5–2.2)

## 2014-04-05 LAB — GLUCOSE, CAPILLARY: Glucose-Capillary: 171 mg/dL — ABNORMAL HIGH (ref 70–99)

## 2014-04-05 LAB — LIPASE, BLOOD: LIPASE: 24 U/L (ref 11–59)

## 2014-04-05 MED ORDER — SODIUM CHLORIDE 0.9 % IV BOLUS (SEPSIS): 1000.0000 mL | Freq: Once | INTRAVENOUS | Status: DC

## 2014-04-05 MED ORDER — NOREPINEPHRINE BITARTRATE 1 MG/ML IV SOLN
2.0000 ug/min | INTRAVENOUS | Status: DC
  Administered 2014-04-06: 5 ug/min via INTRAVENOUS
  Administered 2014-04-06: 10 ug/min via INTRAVENOUS
  Filled 2014-04-05 (×2): qty 4

## 2014-04-05 MED ORDER — SODIUM CHLORIDE 0.9 % IV BOLUS (SEPSIS)
2000.0000 mL | Freq: Once | INTRAVENOUS | Status: AC
  Administered 2014-04-05: 2000 mL via INTRAVENOUS

## 2014-04-05 MED ORDER — CLOZAPINE 100 MG PO TABS
100.0000 mg | ORAL_TABLET | Freq: Two times a day (BID) | ORAL | Status: DC
  Filled 2014-04-05: qty 1

## 2014-04-05 MED ORDER — FLUVOXAMINE MALEATE 100 MG PO TABS
300.0000 mg | ORAL_TABLET | Freq: Every day | ORAL | Status: DC
  Administered 2014-04-06: 300 mg via ORAL
  Filled 2014-04-05 (×3): qty 3

## 2014-04-05 MED ORDER — PIPERACILLIN-TAZOBACTAM 3.375 G IVPB
3.3750 g | Freq: Three times a day (TID) | INTRAVENOUS | Status: DC
  Administered 2014-04-05 – 2014-04-08 (×8): 3.375 g via INTRAVENOUS
  Filled 2014-04-05 (×12): qty 50

## 2014-04-05 MED ORDER — SODIUM CHLORIDE 0.9 % IV BOLUS (SEPSIS)
1000.0000 mL | Freq: Once | INTRAVENOUS | Status: AC
  Administered 2014-04-05: 1000 mL via INTRAVENOUS

## 2014-04-05 MED ORDER — SODIUM CHLORIDE 0.9 % IV SOLN
1.0000 g | Freq: Once | INTRAVENOUS | Status: AC
  Administered 2014-04-05: 1 g via INTRAVENOUS
  Filled 2014-04-05: qty 10

## 2014-04-05 MED ORDER — HEPARIN SODIUM (PORCINE) 5000 UNIT/ML IJ SOLN
5000.0000 [IU] | Freq: Three times a day (TID) | INTRAMUSCULAR | Status: DC
  Administered 2014-04-06 – 2014-04-10 (×11): 5000 [IU] via SUBCUTANEOUS
  Filled 2014-04-05 (×16): qty 1

## 2014-04-05 MED ORDER — ASPIRIN 81 MG PO CHEW
324.0000 mg | CHEWABLE_TABLET | ORAL | Status: AC
  Administered 2014-04-06: 324 mg via ORAL
  Filled 2014-04-05: qty 4

## 2014-04-05 MED ORDER — TRAVOPROST 0.004 % OP SOLN: 1.0000 [drp] | Freq: Every day | OPHTHALMIC | Status: DC

## 2014-04-05 MED ORDER — PEG 3350-KCL-NA BICARB-NACL 420 G PO SOLR
4000.0000 mL | Freq: Once | ORAL | Status: AC
  Administered 2014-04-06: 4000 mL
  Filled 2014-04-05: qty 4000

## 2014-04-05 MED ORDER — SODIUM CHLORIDE 0.9 % IV SOLN
250.0000 mL | INTRAVENOUS | Status: DC | PRN
  Administered 2014-04-06: 250 mL via INTRAVENOUS

## 2014-04-05 MED ORDER — CLOZAPINE 25 MG PO TABS
125.0000 mg | ORAL_TABLET | Freq: Two times a day (BID) | ORAL | Status: DC
  Administered 2014-04-06: 125 mg via ORAL
  Filled 2014-04-05 (×4): qty 1

## 2014-04-05 MED ORDER — IOHEXOL 300 MG/ML  SOLN
25.0000 mL | Freq: Once | INTRAMUSCULAR | Status: AC | PRN
  Administered 2014-04-05: 25 mL via ORAL

## 2014-04-05 MED ORDER — SORBITOL 70 % SOLN
960.0000 mL | TOPICAL_OIL | Freq: Once | ORAL | Status: AC
  Administered 2014-04-06: 960 mL via RECTAL
  Filled 2014-04-05: qty 240

## 2014-04-05 MED ORDER — DEXTROSE 50 % IV SOLN
50.0000 mL | Freq: Once | INTRAVENOUS | Status: AC
  Administered 2014-04-05: 50 mL via INTRAVENOUS
  Filled 2014-04-05: qty 50

## 2014-04-05 MED ORDER — VANCOMYCIN HCL IN DEXTROSE 1-5 GM/200ML-% IV SOLN
1000.0000 mg | Freq: Two times a day (BID) | INTRAVENOUS | Status: DC
  Administered 2014-04-05 – 2014-04-06 (×2): 1000 mg via INTRAVENOUS
  Filled 2014-04-05 (×3): qty 200

## 2014-04-05 MED ORDER — TRAVOPROST (BAK FREE) 0.004 % OP SOLN
1.0000 [drp] | Freq: Every day | OPHTHALMIC | Status: DC
  Administered 2014-04-06 – 2014-04-09 (×4): 1 [drp] via OPHTHALMIC
  Filled 2014-04-05 (×2): qty 2.5

## 2014-04-05 MED ORDER — DEXTROSE-NACL 5-0.9 % IV SOLN
INTRAVENOUS | Status: DC
  Administered 2014-04-05 – 2014-04-07 (×4): via INTRAVENOUS

## 2014-04-05 MED ORDER — CLOZAPINE 25 MG PO TABS: 25.0000 mg | ORAL_TABLET | Freq: Two times a day (BID) | ORAL | Status: DC

## 2014-04-05 MED ORDER — INSULIN ASPART 100 UNIT/ML ~~LOC~~ SOLN
8.0000 [IU] | Freq: Once | SUBCUTANEOUS | Status: AC
  Administered 2014-04-05: 8 [IU] via SUBCUTANEOUS
  Filled 2014-04-05: qty 1

## 2014-04-05 MED ORDER — MIDAZOLAM 5 MG/ML PEDIATRIC INJ FOR INTRANASAL/SUBLINGUAL USE
2.0000 mg | Freq: Once | INTRAMUSCULAR | Status: AC
  Administered 2014-04-05: 2 mg via NASAL

## 2014-04-05 MED ORDER — ASPIRIN 300 MG RE SUPP: 300.0000 mg | RECTAL | Status: AC

## 2014-04-05 NOTE — ED Notes (Signed)
Called pharmacy regarding versed order - will send medicine. Updated MD at bedside.

## 2014-04-05 NOTE — ED Notes (Addendum)
Lactic Acid 5.50 mmol/L given to PA Camprubi-Soms

## 2014-04-05 NOTE — ED Notes (Signed)
Versed wasted with Randell Patient RN. Pharmacy sheet signed by this RN and Randell Patient RN and sent back to pharmacy.

## 2014-04-05 NOTE — H&P (Signed)
PULMONARY / CRITICAL CARE MEDICINE   Name: Elgie Maziarz MRN: [id number] DOB: 14-Sep-1965    ADMISSION DATE:  04/05/2014 CONSULTATION DATE:  04/05/2014   REFERRING MD :  ER  CHIEF COMPLAINT:  hypotension  INITIAL PRESENTATION: 48 y/o pt with schizophernia, developmental delay, history of chronic constipation who presented to ED with 1 day of profuse diarrhea and weakness.   STUDIES:  CT abd - Large recto-sigmoid fecal impaction with ureteral obstruction and resultant hydronephrosis.   SIGNIFICANT EVENTS: Evaluated by surgery, no intervention at this time, GI consulted, recommendations pending.    HISTORY OF PRESENT ILLNESS:  48 y/o man who lives with care-giver.  History of schizophrenia, chronic constipation, neurogenic bowel and hyperlipidemia who was in his normal state of health until late the night prior to admission when he developed profuse, watery diarrhea.  He complained to his caregiver of feeling week and was brought to the [facility] ED.  Here he was found to have an elevated hemoglobin and white blood cell count.  Elevated potassium and creatinine.  He underwent a CT scan that showed a large recto-sigmoid fecal impaction with obstruction of the ureters and bilateral hydronephrosis.  The ED was not able to obtain IV access prior to my insertion of a central line. In the ED he has been hypotensive with systolic BP in the low 41L.  PAST MEDICAL HISTORY :  Past Medical History  Diagnosis Date  . Colonic inertia   . Hyperlipidemia   . Abscess of anal and rectal regions   . OSA (obstructive sleep apnea)   . Hypertension   . Constipation   . Sarcoidosis   . 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 Schizophrenia   . Mental retardation   . Cardiomyopathy   . Glaucoma    Past Surgical History  Procedure Laterality Date  . Groin surgery     Prior to Admission medications   Medication Sig Start Date End Date Taking? Authorizing Provider  bisacodyl (DULCOLAX) 5 MG EC tablet Take 2  capsules by mouth every morning 06/24/13  Yes Lafayette Dragon, MD  cloZAPine (CLOZARIL) 100 MG tablet Take 100 mg by mouth 2 (two) times daily.    Yes Historical Provider, MD  cloZAPine (CLOZARIL) 25 MG tablet Take 25 mg by mouth 2 (two) times daily.   Yes Historical Provider, MD  fluvoxaMINE (LUVOX) 100 MG tablet Take 300 mg by mouth at bedtime.   Yes Historical Provider, MD  magnesium citrate SOLN Take 1 Bottle by mouth 2 (two) times daily.   Yes Historical Provider, MD  Magnesium Hydroxide (MILK OF MAGNESIA CONCENTRATE) 2400 MG/10ML SUSP Take 45 cc (3 tablespoons) by mouth twice daily. 06/24/13  Yes Lafayette Dragon, MD  Multiple Vitamin (MULTIVITAMIN) capsule Take 1 capsule by mouth daily. 07/21/13  Yes Ivin Poot, MD  Nutritional Supplements (ENSURE COMPLETE SHAKE) LIQD Take 1 Can by mouth 2 (two) times daily. 07/02/13  Yes Lafayette Dragon, MD  polyethylene glycol powder (GLYCOLAX/MIRALAX) powder Dissolve 2 capful (34 grams) in at least 16 ounces water/juice and drink twice daily 06/24/13  Yes Lafayette Dragon, MD  pravastatin (PRAVACHOL) 40 MG tablet Take 1 tablet (40 mg total) by mouth daily. 07/21/13  Yes Ivin Poot, MD  psyllium (METAMUCIL) 58.6 % powder Take 1 tablespoon dissolved in at least 8 ounces water/juice and drink twice daily. 06/24/13  Yes Lafayette Dragon, MD  travoprost, benzalkonium, (TRAVATAN) 0.004 % ophthalmic solution Place 1 drop into both eyes at bedtime.  Yes Historical Provider, MD   No Known Allergies  FAMILY HISTORY:  Family History  Problem Relation Age of Onset  . Diabetes Mother   . Colon cancer Neg Hx    SOCIAL HISTORY:  reports that he has never smoked. He has never used smokeless tobacco. He reports that he does not drink alcohol or use illicit drugs. Lives with a caregiver who is at his bedside. Mother is his power of attorney for health care.   REVIEW OF SYSTEMS:  A review of 14 systems was negative except as stated in the HPI.   SUBJECTIVE:   VITAL  SIGNS: Temp:  [97.7 F (36.5 C)] 97.7 F (36.5 C) (09/06 1357) Pulse Rate:  [25-115] 111 (09/06 1945) Resp:  [15-29] 25 (09/06 2040) BP: (74-105)/(35-74) 83/56 mmHg (09/06 2040) SpO2:  [95 %-100 %] 95 % (09/06 1945) HEMODYNAMICS:   VENTILATOR SETTINGS:   INTAKE / OUTPUT:  Intake/Output Summary (Last 24 hours) at 04/05/14 2044 Last data filed at 04/05/14 1716  Gross per 24 hour  Intake   2000 ml  Output      0 ml  Net   2000 ml    PHYSICAL EXAMINATION: General:  Well nourished male laying on stretcher in mild distress Neuro:  Alert to person and place HEENT:  PERRL, EOMI, OP clear, mucous membranes dry and tacky Cardiovascular:  Tachycardic, rate in the 110s.  No murmers or gallops Lungs:  CTAB, no wrr Abdomen:  Distended, absent bowel sounds, diffusely tender to palpation.  Musculoskeletal:  Normal tone, no edema Skin:  No rashes or other lesions.   LABS:  CBC  Recent Labs Lab 04/05/14 1429  WBC 15.8*  HGB 19.8*  HCT 56.7*  PLT 161   Coag's No results found for this basename: APTT, INR,  in the last 168 hours BMET  Recent Labs Lab 04/05/14 1520  NA 135*  K >7.7*  CL 100  CO2 20  BUN 43*  CREATININE 1.73*  GLUCOSE 106*   Electrolytes  Recent Labs Lab 04/05/14 1520  CALCIUM 9.3   Sepsis Markers  Recent Labs Lab 04/05/14 1454 04/05/14 1526  LATICACIDVEN 5.50* 5.54*   ABG No results found for this basename: PHART, PCO2ART, PO2ART,  in the last 168 hours Liver Enzymes  Recent Labs Lab 04/05/14 1520  AST 65*  ALT 27  ALKPHOS 99  BILITOT 1.2  ALBUMIN 3.9   Cardiac Enzymes No results found for this basename: TROPONINI, PROBNP,  in the last 168 hours Glucose No results found for this basename: GLUCAP,  in the last 168 hours  Imaging CT abd  ASSESSMENT / PLAN:  PULMONARY A:No current issues P:     CARDIOVASCULAR CVL: Right subclavian triple lumen catheter.  A: Hypovolemic vs. Septic shock P:  Currently being bolused 2L  NS.  Will bolus another 2L after these. If still hypotensive after 4-5L will start levophed for blood pressure support Repeat lactic acid after 4L NS to evaluate clearance.   RENAL A:  AKI, hyperkalemia P:   Secondary to hypovolemia most likely.  Also with ureteral obstruction from fecal mass.  Will need to follow UOP closely, if no UOP despite fluid resuscitation will need to consult urology. Bolus NS, follow BUN/Cr Stat repeat BMP pending - if persistently hyperkalemic will need glucose, insulin and albuterol.  No kayexelate given fecal impaction.    GASTROINTESTINAL A:  Fecal impaction P:   Evaluated by surgery, no intervention at this time GI consult pending Insert NG tube, start  golytely via tube for clean out.  SMOG enema  HEMATOLOGIC A:  Plethora P:  Likely secondary to volume depletion. No history of lung disease, no known history of polycythemia vera.  Will follow CBC  INFECTIOUS A:  Possible translocation of gut flora P:   BCx2 04/05/2014 UC 04/05/2014  Abx: vanc/zosyn, start date 9/6 - continue pending culture results.    NEUROLOGIC A:  History of schizophrenia P:   RASS goal: 0 Continue home antipsychotics.   TODAY'S SUMMARY: 48 y/o with hypovolemic vs. Septic shock.  Fecal impaction and AKI.  Treating with fluids, antibiotics pending culture results.  Golytely clean out and enemas.  GI to see.   I have personally obtained a history, examined the patient, evaluated laboratory and imaging results, formulated the assessment and plan and placed orders. CRITICAL CARE: The patient is critically ill with multiple organ systems failure and requires high complexity decision making for assessment and support, frequent evaluation and titration of therapies, application of advanced monitoring technologies and extensive interpretation of multiple databases. Critical Care Time devoted to patient care services described in this note is 35 minutes.   [name] MD,  PhD Pulmonary and [place] Pager: [phone]  04/05/2014, 8:44 PM

## 2014-04-05 NOTE — ED Notes (Signed)
Repeat Lactic Acid 5.54 mmol/L given to PA Camprubi-Soms

## 2014-04-05 NOTE — ED Notes (Signed)
EDP at the bedside attempting IV access. Unsuccessful at this time. IV team to see the patient

## 2014-04-05 NOTE — ED Provider Notes (Signed)
CSN: [id number]     Arrival date & time 04/05/14  1337 History   First MD Initiated Contact with Patient 04/05/14 1402     Chief Complaint  Patient presents with  . Diarrhea    L5 CAVEAT: pt is mentally handicapped and cannot provide hx, caregiver providing some history.   (Consider location/radiation/quality/duration/timing/severity/associated sxs/prior Treatment) HPI Comments: Morse Brueggemann is a 48 y.o. male with a PMHx of sarcoidosis, HTN, MR, chronic constipation, and HLD, who presents to the ED brought in by his caregiver who called EMS due to pt having large amounts of watery brown diarrhea x1 day, then pt stated he felt weak and caregiver was concerned. She states this is not normal for him because usually he's constipated. She states he hasn't complained of anything else, but he's disabled and doesn't complain of much. Caregiver denies any fevers, abd distension, hematuria, malodorous urine, melena, hematochezia, vomiting, syncope, sick contacts, recent antibiotics, or recent travel. Pt unable to provider remainder of ROS.   Patient is a 48 y.o. male presenting with diarrhea. The history is provided by a caregiver. The history is limited by a developmental delay. No language interpreter was used.  Diarrhea Quality:  Watery Severity:  Moderate Onset quality:  Sudden Number of episodes:  Unsure, but multiple since last night Duration:  1 day Timing:  Constant Progression:  Unchanged Relieved by:  None tried Worsened by:  Nothing tried Ineffective treatments:  None tried Associated symptoms: abdominal pain (pt seems uncomfortable with palpation by caregiver, unable to report symptom due to developmental delay)   Associated symptoms: no fever and no vomiting   Risk factors: no recent antibiotic use, no sick contacts, no suspicious food intake and no travel to endemic areas     Past Medical History  Diagnosis Date  . Colonic inertia   . Hyperlipidemia   . Abscess of anal and  rectal regions   . OSA (obstructive sleep apnea)   . Hypertension   . Constipation   . Sarcoidosis   . 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 Schizophrenia   . Mental retardation   . Cardiomyopathy   . Glaucoma    Past Surgical History  Procedure Laterality Date  . Groin surgery     Family History  Problem Relation Age of Onset  . Diabetes Mother   . Colon cancer Neg Hx    History  Substance Use Topics  . Smoking status: Never Smoker   . Smokeless tobacco: Never Used  . Alcohol Use: No    Review of Systems  Unable to perform ROS: Other  Constitutional: Negative for fever.  Respiratory: Negative for cough.   Gastrointestinal: Positive for abdominal pain (pt seems uncomfortable with palpation by caregiver, unable to report symptom due to developmental delay) and diarrhea. Negative for vomiting, blood in stool, abdominal distention and anal bleeding.  Genitourinary: Negative for hematuria.  Skin: Negative for color change.  Neurological: Positive for weakness. Negative for syncope.  Psychiatric/Behavioral: Negative for confusion and agitation.      Allergies  Review of patient's allergies indicates no known allergies.  Home Medications   Prior to Admission medications   Medication Sig Start Date End Date Taking? Authorizing Provider  bisacodyl (DULCOLAX) 5 MG EC tablet Take 2 capsules by mouth every morning 06/24/13  Yes Lafayette Dragon, MD  cloZAPine (CLOZARIL) 100 MG tablet Take 100 mg by mouth 2 (two) times daily.    Yes Historical Provider, MD  cloZAPine (CLOZARIL) 25 MG tablet  Take 25 mg by mouth 2 (two) times daily.   Yes Historical Provider, MD  fluvoxaMINE (LUVOX) 100 MG tablet Take 300 mg by mouth at bedtime.   Yes Historical Provider, MD  magnesium citrate SOLN Take 1 Bottle by mouth 2 (two) times daily.   Yes Historical Provider, MD  Magnesium Hydroxide (MILK OF MAGNESIA CONCENTRATE) 2400 MG/10ML SUSP Take 45 cc (3 tablespoons) by mouth twice daily. 06/24/13  Yes  Lafayette Dragon, MD  Multiple Vitamin (MULTIVITAMIN) capsule Take 1 capsule by mouth daily. 07/21/13  Yes Ivin Poot, MD  Nutritional Supplements (ENSURE COMPLETE SHAKE) LIQD Take 1 Can by mouth 2 (two) times daily. 07/02/13  Yes Lafayette Dragon, MD  polyethylene glycol powder (GLYCOLAX/MIRALAX) powder Dissolve 2 capful (34 grams) in at least 16 ounces water/juice and drink twice daily 06/24/13  Yes Lafayette Dragon, MD  pravastatin (PRAVACHOL) 40 MG tablet Take 1 tablet (40 mg total) by mouth daily. 07/21/13  Yes Ivin Poot, MD  psyllium (METAMUCIL) 58.6 % powder Take 1 tablespoon dissolved in at least 8 ounces water/juice and drink twice daily. 06/24/13  Yes Lafayette Dragon, MD  travoprost, benzalkonium, (TRAVATAN) 0.004 % ophthalmic solution Place 1 drop into both eyes at bedtime.    Yes Historical Provider, MD   BP 74/43  Pulse 113  Temp(Src) 97.7 F (36.5 C) (Oral)  Resp 16  SpO2 100% Physical Exam  Nursing note and vitals reviewed. Constitutional: He appears distressed.  Hypotensive, tachycardic. Thin appearing frail [name]. Alert.   HENT:  Head: Normocephalic and atraumatic.  Mouth/Throat: Mucous membranes are dry.  Eyes: Right eye exhibits no discharge. Left eye exhibits no discharge.  Neck: Normal range of motion. Neck supple. No JVD present.  Cardiovascular: Regular rhythm, normal heart sounds and intact distal pulses.  Tachycardia present.  Exam reveals no gallop and no friction rub.   No murmur heard. tachycardic  Pulmonary/Chest: Effort normal and breath sounds normal. No respiratory distress. He has no decreased breath sounds. He has no wheezes. He has no rhonchi. He has no rales.  CTAB although limited due to baseline MR and poor inspiratory effort  Abdominal: Soft. Normal appearance. He exhibits mass (R abdomen). He exhibits no distension and no pulsatile midline mass. Bowel sounds are decreased. There is tenderness. There is no rigidity, no rebound and no guarding.  Thin and  cachetic male, sunken abdomen, soft, nondistended. Generalized TTP, no pulsatile mass, large mass in R colon. No r/g/r. Hypoactive BS.  Musculoskeletal: Normal range of motion.  MAE x4, baseline movement  Neurological: He is alert.  Alert. Unable to perform full neuro given mental disability  Skin: Skin is warm, dry and intact. No rash noted. He is not diaphoretic. No pallor.  Psychiatric:  Baseline MR    ED Course  Procedures (including critical care time) Labs Review Labs Reviewed  CBC WITH DIFFERENTIAL - Abnormal; Notable for the following:    WBC 15.8 (*)    RBC 6.35 (*)    Hemoglobin 19.8 (*)    HCT 56.7 (*)    Neutrophils Relative % 83 (*)    Neutro Abs 13.1 (*)    All other components within normal limits  COMPREHENSIVE METABOLIC PANEL - Abnormal; Notable for the following:    Sodium 135 (*)    Potassium >7.7 (*)    Glucose, Bld 106 (*)    BUN 43 (*)    Creatinine, Ser 1.73 (*)    AST 65 (*)    GFR calc non  Af Amer 45 (*)    GFR calc Af Amer 52 (*)    All other components within normal limits  I-STAT CG4 LACTIC ACID, ED - Abnormal; Notable for the following:    Lactic Acid, Venous 5.50 (*)    All other components within normal limits  POC OCCULT BLOOD, ED - Abnormal; Notable for the following:    Fecal Occult Bld POSITIVE (*)    All other components within normal limits  I-STAT CG4 LACTIC ACID, ED - Abnormal; Notable for the following:    Lactic Acid, Venous 5.54 (*)    All other components within normal limits  CULTURE, BLOOD (ROUTINE X 2)  CULTURE, BLOOD (ROUTINE X 2)  CLOSTRIDIUM DIFFICILE BY PCR  LIPASE, BLOOD  OCCULT BLOOD X 1 CARD TO LAB, STOOL  URINALYSIS, ROUTINE W REFLEX MICROSCOPIC    Imaging Review No results found.   EKG Interpretation None    EKG: sinus tachy without STEMI  MDM   Final diagnoses:  SIRS (systemic inflammatory response syndrome)  Hypotension, unspecified hypotension type  Diarrhea    48y/o male with diarrhea and  possible abd pain, although pt is MR and unable to provide much history or acknowledge if exam is painful. Caregiver states he's chronically constipated and this diarrhea is abnormal for him. Exam reveals possible diffuse TTP, nondistended, but hypoactive BS concerning for obstruction, diarrhea could be overflow going around blockage. Will obtain labs and give fluids. Will obtain acute abd series for quick eval of obstruction/perf. Afebrile.  3:55 PM Repeat lactic acid 5.54. Acute abd series still not done. Will obtain CT abd/pelvis given concern for obstruction or mesenteric ischemia or ongoing bleed/perf. CBC w/diff showing WBC 15.8, Hgb 19.8, Hct 56.7 which could be hemoconcentrated, and possibly the WBC count is not actually elevated. Lipase and CMP pending. FOBT positive, nursing staff states his stool was brown, therefore this could simply be residual bleed but not large GI bleed. Total of 2L fluids being administered and VS continue to show tachycardia and hypotension. SIRS criteria technically met. Awaiting CT. Likely admission. Will start Vanc/Zosyn and get blood cultures.  4:35 PM Pt lost IV access prior to 3rd L bolus. Has soiled himself. Still awaiting U/A, ordered I&O. Dr. Venora Maples has talked with Dr. Earnest Conroy from critical care who will place central line, and admit.  5:32 PM CMP showing K 7.7 although hemolyzed and could be hemoconcentrated. Will give insulin D50 and calcium gluc. Cr 1.73 and BUN 43, GFR 52, appears to be in renal failure. Nursing reporting that they can't get I&O done, met resistance, pt still has not gotten CT. Told nursing he needs to go to scanner now, this is the primary concern at this time. Dr. Earnest Conroy aware of pt, will be admitting from here given SIRS criteria. Unclear etiology. Dr. Rogene Houston informed of this pt, will follow up on results of CT, but CCM will be admitting patient. VSS stable at this time.  BP 105/69  Pulse 115  Temp(Src) 97.7 F (36.5 C) (Oral)  Resp 23  SpO2  98%  Meds ordered this encounter  Medications  . sodium chloride 0.9 % bolus 1,000 mL    Sig:   . sodium chloride 0.9 % bolus 2,000 mL    Sig:   . iohexol (OMNIPAQUE) 300 MG/ML solution 25 mL    Sig:   . vancomycin (VANCOCIN) IVPB 1000 mg/200 mL premix    Sig:     Order Specific Question:  Indication:    Answer:  Sepsis  . piperacillin-tazobactam (ZOSYN) IVPB 3.375 g    Sig:     Order Specific Question:  Antibiotic Indication:    Answer:  Sepsis  . calcium gluconate 1 g in sodium chloride 0.9 % 100 mL IVPB    Sig:   . insulin aspart (novoLOG) injection 8 Units    Sig:   . dextrose 50 % solution 50 mL    Sig:      [employer], PA-C 04/05/14 1757

## 2014-04-05 NOTE — ED Notes (Signed)
In & Out Cath attempted by Quita Skye, EMT unsuccessful.  Notified Dr Venora Maples.

## 2014-04-05 NOTE — ED Notes (Addendum)
Pt IV infiltrated.  Notified Dr Venora Maples who is aware of pts continuing BP in the 80s.

## 2014-04-05 NOTE — Consult Note (Signed)
Reason for Consult:fecal impaction Referring Physician: Dr [name] Branch is an 48 y.o. male.  HPI: 48 yo [name] with MR, schizophrenia, h/o OSA, HTN, sarcoidosis and severe colonic inertia is brought to ED by his caregiver b/c diarrhea. States that when he gets diarrhea that usually means he has an impaction. She reports he was behaving normally yesterday and began having diarrhea last pm. Reports he generally has a BM every other day. Pt is currently on extensive bowel regimen for his constipation. Was hospitalized in 2007 for similar problem. She reports he has had normal appetite and urination. In ED, he has been hypotensive, Acute kidney injury, elevated lactic acid, severe dehydration, and question of hyperkalemia. Had CT that showed massive rectosigmoid fecal impaction causing b/l hydro.   Past Medical History  Diagnosis Date  . Colonic inertia   . Hyperlipidemia   . Abscess of anal and rectal regions   . OSA (obstructive sleep apnea)   . Hypertension   . Constipation   . Sarcoidosis   . 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 Schizophrenia   . Mental retardation   . Cardiomyopathy   . Glaucoma     Past Surgical History  Procedure Laterality Date  . Groin surgery      Family History  Problem Relation Age of Onset  . Diabetes Mother   . Colon cancer Neg Hx     Social History:  reports that he has never smoked. He has never used smokeless tobacco. He reports that he does not drink alcohol or use illicit drugs.  Allergies: No Known Allergies  Medications: I have reviewed the patient's current medications.  Results for orders placed during the hospital encounter of 04/05/14 (from the past 48 hour(s))  CBC WITH DIFFERENTIAL     Status: Abnormal   Collection Time    04/05/14  2:29 PM      Result Value Ref Range   WBC 15.8 (*) 4.0 - 10.5 K/uL   Comment: WHITE COUNT CONFIRMED ON SMEAR   RBC 6.35 (*) 4.22 - 5.81 MIL/uL   Hemoglobin 19.8 (*) 13.0 - 17.0 g/dL   HCT 56.7  (*) 39.0 - 52.0 %   MCV 89.3  78.0 - 100.0 fL   MCH 31.2  26.0 - 34.0 pg   MCHC 34.9  30.0 - 36.0 g/dL   RDW 15.0  11.5 - 15.5 %   Platelets 161  150 - 400 K/uL   Comment: PLATELET COUNT CONFIRMED BY SMEAR   Neutrophils Relative % 83 (*) 43 - 77 %   Lymphocytes Relative 12  12 - 46 %   Monocytes Relative 5  3 - 12 %   Eosinophils Relative 0  0 - 5 %   Basophils Relative 0  0 - 1 %   Neutro Abs 13.1 (*) 1.7 - 7.7 K/uL   Lymphs Abs 1.9  0.7 - 4.0 K/uL   Monocytes Absolute 0.8  0.1 - 1.0 K/uL   Eosinophils Absolute 0.0  0.0 - 0.7 K/uL   Basophils Absolute 0.0  0.0 - 0.1 K/uL  I-STAT CG4 LACTIC ACID, ED     Status: Abnormal   Collection Time    04/05/14  2:54 PM      Result Value Ref Range   Lactic Acid, Venous 5.50 (*) 0.5 - 2.2 mmol/L  POC OCCULT BLOOD, ED     Status: Abnormal   Collection Time    04/05/14  3:13 PM  Result Value Ref Range   Fecal Occult Bld POSITIVE (*) NEGATIVE  COMPREHENSIVE METABOLIC PANEL     Status: Abnormal   Collection Time    04/05/14  3:20 PM      Result Value Ref Range   Sodium 135 (*) 137 - 147 mEq/L   Potassium >7.7 (*) 3.7 - 5.3 mEq/L   Comment: CRITICAL RESULT CALLED TO, READ BACK BY AND VERIFIED WITH:     A.THOMPSON RN [phone] 04/05/14 E.GADDY     SLIGHT HEMOLYSIS   Chloride 100  96 - 112 mEq/L   CO2 20  19 - 32 mEq/L   Glucose, Bld 106 (*) 70 - 99 mg/dL   BUN 43 (*) 6 - 23 mg/dL   Creatinine, Ser 1.73 (*) 0.50 - 1.35 mg/dL   Calcium 9.3  8.4 - 10.5 mg/dL   Total Protein 7.3  6.0 - 8.3 g/dL   Albumin 3.9  3.5 - 5.2 g/dL   AST 65 (*) 0 - 37 U/L   Comment: HEMOLYSIS AT THIS LEVEL MAY AFFECT RESULT   ALT 27  0 - 53 U/L   Alkaline Phosphatase 99  39 - 117 U/L   Total Bilirubin 1.2  0.3 - 1.2 mg/dL   GFR calc non Af Amer 45 (*) >90 mL/min   GFR calc Af Amer 52 (*) >90 mL/min   Comment: (NOTE)     The eGFR has been calculated using the CKD EPI equation.     This calculation has not been validated in all clinical situations.     eGFR's  persistently <90 mL/min signify possible Chronic Kidney     Disease.   Anion gap 15  5 - 15  LIPASE, BLOOD     Status: None   Collection Time    04/05/14  3:20 PM      Result Value Ref Range   Lipase 24  11 - 59 U/L  I-STAT CG4 LACTIC ACID, ED     Status: Abnormal   Collection Time    04/05/14  3:26 PM      Result Value Ref Range   Lactic Acid, Venous 5.54 (*) 0.5 - 2.2 mmol/L    Ct Abdomen Pelvis Wo Contrast  04/05/2014   CLINICAL DATA:  Abdominal pain. Sepsis. Diarrhea. Splenomegaly. Sarcoidosis.  EXAM: CT ABDOMEN AND PELVIS WITHOUT CONTRAST  TECHNIQUE: Multidetector CT imaging of the abdomen and pelvis was performed following the standard protocol without IV contrast.  COMPARISON:  None.  FINDINGS: Kidneys/Urinary tract: No evidence of urolithiasis. Severe bilateral hydronephrosis and ureterectasis is seen due to compression by severely dilated rectosigmoid colon containing stool.  Liver: No mass or other abnormality visualized on this non-contrast exam.  Gallbladder/Biliary:  Unremarkable.  Pancreas: No mass or inflammatory process visualized on this non-contrast exam.  Spleen:  Within normal limits in size.  Adrenal Glands:  No mass identified.  Lymph Nodes:  No pathologically enlarged lymph nodes identified.  Pelvic/Reproductive Organs: Bladder and prostate displaced bites freely dilated rectosigmoid colon containing stool.  Bowel/Peritoneum: Severe dilatation of rectosigmoid colon is seen containing large amount of stool. Proximal colon and small bowel remain nondilated.  Vascular:  No evidence of abdominal aortic aneurysm.  Musculoskeletal:  No suspicious bone lesions identified.  Other: Images through the lung bases show mild bibasilar scarring. A 6 mm noncalcified pulmonary nodules also seen in the inferior aspect of the right middle lobe on image 4, which is indeterminate.  IMPRESSION: Severely dilated rectosigmoid colon filled with stool, consistent with fecal  impaction. This results in  bilateral of distal ureteral obstruction and severe bilateral hydronephrosis.  6 mm indeterminate pulmonary nodule noted in the inferior aspect of the right middle lobe. If the patient is at high risk for bronchogenic carcinoma, follow-up chest CT at 6-12 months is recommended. If the patient is at low risk for bronchogenic carcinoma, follow-up chest CT at 12 months is recommended. This recommendation follows the consensus statement: Guidelines for Management of Small Pulmonary Nodules Detected on CT Scans: A Statement from the [employer] as published in Radiology 2005;237:395-400.   Electronically Signed   By: Earle Gell M.D.   On: 04/05/2014 18:03   Dg Abd Acute W/chest  04/05/2014   CLINICAL DATA:  Chronic constipation and diarrhea. Rule out obstruction.  EXAM: ACUTE ABDOMEN SERIES (ABDOMEN 2 VIEW & CHEST 1 VIEW)  COMPARISON:  CT today and chest x-ray 01/02/2006 with abdominal films 12/01/2008  FINDINGS: Patient is rotated to the right. Lungs are otherwise adequately inflated without consolidation or effusion. Cardiomediastinal silhouette and remainder of the chest is unchanged. There is no free peritoneal air.  Abdominal images demonstrate evidence of patient's known severe dilatation of the rectosigmoid colon extending into the right abdomen with moderate fecal retention. No dilated small bowel loops. Degenerative changes of the spine and hips.  IMPRESSION: Evidence of patient's severely dilated rectosigmoid colon extending into the right abdomen with moderate fecal retention as seen on the CT scan from earlier today. No dilated small bowel loops. No free air.  No acute cardiopulmonary disease.   Electronically Signed   By: Marin Olp M.D.   On: 04/05/2014 18:23    Review of Systems  Unable to perform ROS: mental acuity   Blood pressure 81/54, pulse 111, temperature 97.7 F (36.5 C), temperature source Oral, resp. rate 23, SpO2 98.00%. Physical Exam  Vitals reviewed. Constitutional: He  appears well-developed.  Non-toxic appearance. No distress.  CCM fellow prepping Rt neck for central line  HENT:  Head: Normocephalic and atraumatic.  Eyes: Pupils are equal, round, and reactive to light. No scleral icterus.  Muddy sclera. Bulging eyes  Neck: No tracheal deviation present. No thyromegaly present.  Cardiovascular: Normal rate, regular rhythm and normal heart sounds.   Respiratory: Effort normal and breath sounds normal. No stridor. No respiratory distress. He has no wheezes.  GI: Soft. There is no rebound and no guarding.  Full abdomen. Some TTP in lower midline, Left abdomen. No RT/guarding/peritonitis.   Genitourinary:  Sitting on bedpan, loose stool all over perineum/scrotum. DRE - no obvious blockage. Palpable large stool burden in rectal vault. No gross blood or melena. Tried to finger fracture some of it but tender on rectal exam.   Musculoskeletal: He exhibits no edema.  Neurological: He is alert. He exhibits normal muscle tone.  Skin: Skin is warm and dry. No rash noted. He is not diaphoretic. No erythema.  Psychiatric: His behavior is normal.    Assessment/Plan: Hypotension Severe dehydration Colonic inertia with massive rectosigmoid impaction Acute renal failure Bilateral hydronephrosis  Schizophrenia H/o htn Sarcoidosis MR  No role for emergent/urgent surgery. Believe WBC is actually artifact given his severe hemoconcentration/dehydration. No wall thickening, no pneumatosis, no free air. Needs GI consult for fecal impaction. Needs bowel regimen - enemas/as well as from above -but will defer to GI. No evidence of distal stricture on imaging or on rectal exam. Can probably break up impaction with scope.   Strict i/o. Consider foley Repeat potassium IVF resuscitation.   Leighton Ruff. Redmond Pulling, MD,  FACS General, Bariatric, & Minimally Invasive Surgery [facility]   [facility] M 04/05/2014, 7:52 PM

## 2014-04-05 NOTE — ED Notes (Signed)
IV team attempted IV access again without success.

## 2014-04-05 NOTE — ED Notes (Signed)
Called bed placement regarding bed request. Will process order.

## 2014-04-05 NOTE — ED Notes (Signed)
To ED via [facility] for eval of diarrhea for past 2 days. Hypotensive. Alert. Hx of chronic constipation

## 2014-04-05 NOTE — Progress Notes (Signed)
ANTIBIOTIC CONSULT NOTE - INITIAL  Pharmacy Consult for Vanco/Zosyn Indication: rule out sepsis  No Known Allergies  Patient Measurements:   Adjusted Body Weight:    Vital Signs: Temp: 97.7 F (36.5 C) (09/06 1357) Temp src: Oral (09/06 1357) BP: 79/62 mmHg (09/06 1655) Pulse Rate: 108 (09/06 1655) Intake/Output from previous day:   Intake/Output from this shift:    Labs:  Recent Labs  04/05/14 1429 04/05/14 1520  WBC 15.8*  --   HGB 19.8*  --   PLT 161  --   CREATININE  --  1.73*   The CrCl is unknown because both a height and weight (above a minimum accepted value) are required for this calculation. No results found for this basename: VANCOTROUGH, VANCOPEAK, VANCORANDOM, GENTTROUGH, GENTPEAK, GENTRANDOM, TOBRATROUGH, TOBRAPEAK, TOBRARND, AMIKACINPEAK, AMIKACINTROU, AMIKACIN,  in the last 72 hours   Microbiology: No results found for this or any previous visit (from the past 720 hour(s)).  Medical History: Past Medical History  Diagnosis Date  . Colonic inertia   . Hyperlipidemia   . Abscess of anal and rectal regions   . OSA (obstructive sleep apnea)   . Hypertension   . Constipation   . Sarcoidosis   . 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 Schizophrenia   . Mental retardation   . Cardiomyopathy   . Glaucoma     Medications: see med rec  Assessment: 48 y/o mentally retarded male with chronic constipation presents with diarrhea and abdominal pain. FOBT+. Noted K>7.7 (?) with Scr 1.73 (CrCl 62). AST slightly elevated 65. WBC elevated at 15.8. Plts only 161. LA elevated x 2.   Goal of Therapy:  Vancomycin trough level 15-20 mcg/ml  Plan:  Zosyn 3.375g IV q8hr. Vancomycin 1g IV q12h. Trough after 3-5 doses at steady state.   Jasline Buskirk S. Alford Highland, PharmD, BCPS Clinical Staff Pharmacist Pager [phone]  Eilene Ghazi Stillinger 04/05/2014,4:59 PM

## 2014-04-05 NOTE — ED Notes (Addendum)
Rt subclavian placed by Dr Lincoln Maxin, xray done in room, Dr Lincoln Maxin gave verbal ok to use. Also stated ok to start antibiotics after one BC. Also advised by MD not to give insulin, D50, or calcium gluconate until BMP results and if K is still >5.5

## 2014-04-05 NOTE — ED Notes (Signed)
[name] Branch, EMT in room and cleaning pt.

## 2014-04-05 NOTE — ED Provider Notes (Signed)
ECG interpretation   Date: 04/05/2014  Rate: 117  Rhythm: Sinus tachycardia  QRS Axis: normal  Intervals: normal  ST/T Wave abnormalities: normal  Conduction Disutrbances: RBBB Narrative Interpretation:   Old EKG Reviewed: No significant changes noted     Hoy Morn, MD 04/05/14 1401

## 2014-04-05 NOTE — ED Notes (Signed)
Dr Venora Maples at bedside to attempt IV.

## 2014-04-05 NOTE — ED Notes (Signed)
Xray called for stat portable xray for line placement.

## 2014-04-05 NOTE — ED Notes (Signed)
Attempted report 

## 2014-04-05 NOTE — ED Notes (Addendum)
Dr Rogene Houston at bedside.  Aware of pts BP in the 94V systolic with no IV access.

## 2014-04-05 NOTE — ED Provider Notes (Addendum)
Medical screening examination/treatment/procedure(s) were conducted as a shared visit with non-physician practitioner(s) and myself.  I personally evaluated the patient during the encounter.   EKG Interpretation None      Patient history of chronic constipation turned over to me at the end of the day shift from Dr. Venora Maples. Patient came in by EMS for having large amounts of watery brown diarrhea for a day. Patient was hypotensive marked elevation in the lactic acid and marked hyperkalemia possibility of it being hemolysis entertained.  Patient's CT shows massive rectal dilatation and constipation compressing on the ureters resulting in significant hydronephrosis. Critical care started a central line. Patient will require admission and discussion the critical care they will do the admission. I will consult general surgery regarding the massive rectal mass. Also GI medicine probably would be appropriate for consultation as well.   Results for orders placed during the hospital encounter of 04/05/14  CBC WITH DIFFERENTIAL      Result Value Ref Range   WBC 15.8 (*) 4.0 - 10.5 K/uL   RBC 6.35 (*) 4.22 - 5.81 MIL/uL   Hemoglobin 19.8 (*) 13.0 - 17.0 g/dL   HCT 56.7 (*) 39.0 - 52.0 %   MCV 89.3  78.0 - 100.0 fL   MCH 31.2  26.0 - 34.0 pg   MCHC 34.9  30.0 - 36.0 g/dL   RDW 15.0  11.5 - 15.5 %   Platelets 161  150 - 400 K/uL   Neutrophils Relative % 83 (*) 43 - 77 %   Lymphocytes Relative 12  12 - 46 %   Monocytes Relative 5  3 - 12 %   Eosinophils Relative 0  0 - 5 %   Basophils Relative 0  0 - 1 %   Neutro Abs 13.1 (*) 1.7 - 7.7 K/uL   Lymphs Abs 1.9  0.7 - 4.0 K/uL   Monocytes Absolute 0.8  0.1 - 1.0 K/uL   Eosinophils Absolute 0.0  0.0 - 0.7 K/uL   Basophils Absolute 0.0  0.0 - 0.1 K/uL  COMPREHENSIVE METABOLIC PANEL      Result Value Ref Range   Sodium 135 (*) 137 - 147 mEq/L   Potassium >7.7 (*) 3.7 - 5.3 mEq/L   Chloride 100  96 - 112 mEq/L   CO2 20  19 - 32 mEq/L   Glucose, Bld  106 (*) 70 - 99 mg/dL   BUN 43 (*) 6 - 23 mg/dL   Creatinine, Ser 1.73 (*) 0.50 - 1.35 mg/dL   Calcium 9.3  8.4 - 10.5 mg/dL   Total Protein 7.3  6.0 - 8.3 g/dL   Albumin 3.9  3.5 - 5.2 g/dL   AST 65 (*) 0 - 37 U/L   ALT 27  0 - 53 U/L   Alkaline Phosphatase 99  39 - 117 U/L   Total Bilirubin 1.2  0.3 - 1.2 mg/dL   GFR calc non Af Amer 45 (*) >90 mL/min   GFR calc Af Amer 52 (*) >90 mL/min   Anion gap 15  5 - 15  LIPASE, BLOOD      Result Value Ref Range   Lipase 24  11 - 59 U/L  I-STAT CG4 LACTIC ACID, ED      Result Value Ref Range   Lactic Acid, Venous 5.50 (*) 0.5 - 2.2 mmol/L  POC OCCULT BLOOD, ED      Result Value Ref Range   Fecal Occult Bld POSITIVE (*) NEGATIVE  I-STAT CG4 LACTIC ACID,  ED      Result Value Ref Range   Lactic Acid, Venous 5.54 (*) 0.5 - 2.2 mmol/L   Ct Abdomen Pelvis Wo Contrast  04/05/2014   CLINICAL DATA:  Abdominal pain. Sepsis. Diarrhea. Splenomegaly. Sarcoidosis.  EXAM: CT ABDOMEN AND PELVIS WITHOUT CONTRAST  TECHNIQUE: Multidetector CT imaging of the abdomen and pelvis was performed following the standard protocol without IV contrast.  COMPARISON:  None.  FINDINGS: Kidneys/Urinary tract: No evidence of urolithiasis. Severe bilateral hydronephrosis and ureterectasis is seen due to compression by severely dilated rectosigmoid colon containing stool.  Liver: No mass or other abnormality visualized on this non-contrast exam.  Gallbladder/Biliary:  Unremarkable.  Pancreas: No mass or inflammatory process visualized on this non-contrast exam.  Spleen:  Within normal limits in size.  Adrenal Glands:  No mass identified.  Lymph Nodes:  No pathologically enlarged lymph nodes identified.  Pelvic/Reproductive Organs: Bladder and prostate displaced bites freely dilated rectosigmoid colon containing stool.  Bowel/Peritoneum: Severe dilatation of rectosigmoid colon is seen containing large amount of stool. Proximal colon and small bowel remain nondilated.  Vascular:  No  evidence of abdominal aortic aneurysm.  Musculoskeletal:  No suspicious bone lesions identified.  Other: Images through the lung bases show mild bibasilar scarring. A 6 mm noncalcified pulmonary nodules also seen in the inferior aspect of the right middle lobe on image 4, which is indeterminate.  IMPRESSION: Severely dilated rectosigmoid colon filled with stool, consistent with fecal impaction. This results in bilateral of distal ureteral obstruction and severe bilateral hydronephrosis.  6 mm indeterminate pulmonary nodule noted in the inferior aspect of the right middle lobe. If the patient is at high risk for bronchogenic carcinoma, follow-up chest CT at 6-12 months is recommended. If the patient is at low risk for bronchogenic carcinoma, follow-up chest CT at 12 months is recommended. This recommendation follows the consensus statement: Guidelines for Management of Small Pulmonary Nodules Detected on CT Scans: A Statement from the [employer] as published in Radiology 2005;237:395-400.   Electronically Signed   By: Earle Gell M.D.   On: 04/05/2014 18:03   Dg Abd Acute W/chest  04/05/2014   CLINICAL DATA:  Chronic constipation and diarrhea. Rule out obstruction.  EXAM: ACUTE ABDOMEN SERIES (ABDOMEN 2 VIEW & CHEST 1 VIEW)  COMPARISON:  CT today and chest x-ray 01/02/2006 with abdominal films 12/01/2008  FINDINGS: Patient is rotated to the right. Lungs are otherwise adequately inflated without consolidation or effusion. Cardiomediastinal silhouette and remainder of the chest is unchanged. There is no free peritoneal air.  Abdominal images demonstrate evidence of patient's known severe dilatation of the rectosigmoid colon extending into the right abdomen with moderate fecal retention. No dilated small bowel loops. Degenerative changes of the spine and hips.  IMPRESSION: Evidence of patient's severely dilated rectosigmoid colon extending into the right abdomen with moderate fecal retention as seen on the CT  scan from earlier today. No dilated small bowel loops. No free air.  No acute cardiopulmonary disease.   Electronically Signed   By: Marin Olp M.D.   On: 04/05/2014 18:23      Fredia Sorrow, MD 04/05/14 1833  Was left with the impression that critical care had placed a central line in this patient. Patient does not have a central line. Will rediscuss with critical care. Have consult did general surgery at their request but may be more appropriate to have GI medicine involved since he is followed by Dr. Olevia Perches have placed a call into them for consultation as  well. Will recontact critical care about the central line. Overall picture appears as if patient definitely is septic clearly has the chronic constipation clearly has the hydronephrosis probably due to the large rectal sigmoid mass of stool. Unlikely that that high potassium of greater than 7.7 is real based on the patient's EKG. Most likely significant hemolysis. Repeat potassium is pending.   CRITICAL CARE Performed by: Fredia Sorrow Total critical care time: 30 Critical care time was exclusive of separately billable procedures and treating other patients. Critical care was necessary to treat or prevent imminent or life-threatening deterioration. Critical care was time spent personally by me on the following activities: development of treatment plan with patient and/or surrogate as well as nursing, discussions with consultants, evaluation of patient's response to treatment, examination of patient, obtaining history from patient or surrogate, ordering and performing treatments and interventions, ordering and review of laboratory studies, ordering and review of radiographic studies, pulse oximetry and re-evaluation of patient's condition.   Fredia Sorrow, MD 04/05/14 [id number]

## 2014-04-05 NOTE — ED Notes (Addendum)
Attempted IV access, unable to obtain. IV team paged for start

## 2014-04-05 NOTE — ED Notes (Signed)
This RN accompanied the pt to CT and x-ray.

## 2014-04-05 NOTE — Procedures (Signed)
Central Venous Catheter Insertion Procedure Note [name] Branch [id number] [birth year]  Procedure: Insertion of Central Venous Catheter Indications: Assessment of intravascular volume, Drug and/or fluid administration and Frequent blood sampling  Procedure Details Consent: Unable to obtain consent because of emergent medical necessity.  Pt's care giver consented to procedure however she is not power of attorney for health care.  Pt's caregiver had discussed procedure with patient's mother who gave verbal consent however she was not able to be reached at the time of the procedure.  Time Out: Verified patient identification, verified procedure, site/side was marked, verified correct patient position, special equipment/implants available, medications/allergies/relevent history reviewed, required imaging and test results available.  Performed  Maximum sterile technique was used including antiseptics, cap, gloves, gown, hand hygiene, mask and sheet. Skin prep: Chlorhexidine; local anesthetic administered A antimicrobial bonded/coated triple lumen catheter was placed in the right subclavian vein using the Seldinger technique.  Evaluation Blood flow good Complications: No apparent complications Patient did tolerate procedure well. Chest X-ray ordered to verify placement.  CXR: pending.  [name] Branch. 04/05/2014, 8:24 PM

## 2014-04-05 NOTE — ED Notes (Signed)
IV team at bedside 

## 2014-04-06 ENCOUNTER — Inpatient Hospital Stay (HOSPITAL_COMMUNITY): Payer: Medicare Other

## 2014-04-06 ENCOUNTER — Encounter (HOSPITAL_COMMUNITY): Payer: Self-pay | Admitting: *Deleted

## 2014-04-06 DIAGNOSIS — R6510 Systemic inflammatory response syndrome (SIRS) of non-infectious origin without acute organ dysfunction: Secondary | ICD-10-CM

## 2014-04-06 DIAGNOSIS — K5649 Other impaction of intestine: Secondary | ICD-10-CM

## 2014-04-06 DIAGNOSIS — I959 Hypotension, unspecified: Secondary | ICD-10-CM

## 2014-04-06 LAB — COMPREHENSIVE METABOLIC PANEL
ALT: 33 U/L (ref 0–53)
AST: 66 U/L — ABNORMAL HIGH (ref 0–37)
Albumin: 2.5 g/dL — ABNORMAL LOW (ref 3.5–5.2)
Alkaline Phosphatase: 66 U/L (ref 39–117)
Anion gap: 11 (ref 5–15)
BILIRUBIN TOTAL: 0.8 mg/dL (ref 0.3–1.2)
BUN: 37 mg/dL — ABNORMAL HIGH (ref 6–23)
CO2: 17 meq/L — AB (ref 19–32)
Calcium: 7.1 mg/dL — ABNORMAL LOW (ref 8.4–10.5)
Chloride: 113 mEq/L — ABNORMAL HIGH (ref 96–112)
Creatinine, Ser: 1.48 mg/dL — ABNORMAL HIGH (ref 0.50–1.35)
GFR calc Af Amer: 63 mL/min — ABNORMAL LOW (ref >90)
GFR, EST NON AFRICAN AMERICAN: 55 mL/min — AB (ref >90)
GLUCOSE: 174 mg/dL — AB (ref 70–99)
Potassium: 5 mEq/L (ref 3.7–5.3)
SODIUM: 141 meq/L (ref 137–147)
Total Protein: 5.1 g/dL — ABNORMAL LOW (ref 6.0–8.3)

## 2014-04-06 LAB — URINALYSIS, ROUTINE W REFLEX MICROSCOPIC
GLUCOSE, UA: NEGATIVE mg/dL
Hgb urine dipstick: NEGATIVE
KETONES UR: 15 mg/dL — AB
Nitrite: POSITIVE — AB
PH: 5 (ref 5.0–8.0)
Protein, ur: NEGATIVE mg/dL
Specific Gravity, Urine: 1.028 (ref 1.005–1.030)
Urobilinogen, UA: 1 mg/dL (ref 0.0–1.0)

## 2014-04-06 LAB — BASIC METABOLIC PANEL
Anion gap: 8 (ref 5–15)
Anion gap: 9 (ref 5–15)
BUN: 43 mg/dL — ABNORMAL HIGH (ref 6–23)
BUN: 40 mg/dL — AB (ref 6–23)
CALCIUM: 6.6 mg/dL — AB (ref 8.4–10.5)
CO2: 16 mEq/L — ABNORMAL LOW (ref 19–32)
CO2: 18 mEq/L — ABNORMAL LOW (ref 19–32)
CREATININE: 1.63 mg/dL — AB (ref 0.50–1.35)
Calcium: 7.4 mg/dL — ABNORMAL LOW (ref 8.4–10.5)
Chloride: 111 mEq/L (ref 96–112)
Chloride: 109 mEq/L (ref 96–112)
Creatinine, Ser: 1.6 mg/dL — ABNORMAL HIGH (ref 0.50–1.35)
GFR calc Af Amer: 56 mL/min — ABNORMAL LOW (ref >90)
GFR calc Af Amer: 58 mL/min — ABNORMAL LOW (ref >90)
GFR, EST NON AFRICAN AMERICAN: 49 mL/min — AB (ref >90)
GFR, EST NON AFRICAN AMERICAN: 50 mL/min — AB (ref >90)
GLUCOSE: 138 mg/dL — AB (ref 70–99)
Glucose, Bld: 177 mg/dL — ABNORMAL HIGH (ref 70–99)
POTASSIUM: 6 meq/L — AB (ref 3.7–5.3)
Potassium: 6.2 mEq/L — ABNORMAL HIGH (ref 3.7–5.3)
Sodium: 135 mEq/L — ABNORMAL LOW (ref 137–147)
Sodium: 136 mEq/L — ABNORMAL LOW (ref 137–147)

## 2014-04-06 LAB — CBC
HEMATOCRIT: 46.4 % (ref 39.0–52.0)
Hemoglobin: 15.9 g/dL (ref 13.0–17.0)
MCH: 30.2 pg (ref 26.0–34.0)
MCHC: 34.3 g/dL (ref 30.0–36.0)
MCV: 88 fL (ref 78.0–100.0)
Platelets: 146 10*3/uL — ABNORMAL LOW (ref 150–400)
RBC: 5.27 MIL/uL (ref 4.22–5.81)
RDW: 14.4 % (ref 11.5–15.5)
WBC: 11.3 10*3/uL — ABNORMAL HIGH (ref 4.0–10.5)

## 2014-04-06 LAB — URINE MICROSCOPIC-ADD ON

## 2014-04-06 LAB — CLOSTRIDIUM DIFFICILE BY PCR: Toxigenic C. Difficile by PCR: NEGATIVE

## 2014-04-06 LAB — GLUCOSE, CAPILLARY
GLUCOSE-CAPILLARY: 174 mg/dL — AB (ref 70–99)
Glucose-Capillary: 155 mg/dL — ABNORMAL HIGH (ref 70–99)
Glucose-Capillary: 121 mg/dL — ABNORMAL HIGH (ref 70–99)

## 2014-04-06 LAB — MRSA PCR SCREENING: MRSA by PCR: POSITIVE — AB

## 2014-04-06 LAB — PROCALCITONIN: Procalcitonin: 37.79 ng/mL

## 2014-04-06 LAB — MAGNESIUM: Magnesium: 4.2 mg/dL — ABNORMAL HIGH (ref 1.5–2.5)

## 2014-04-06 LAB — LACTIC ACID, PLASMA: Lactic Acid, Venous: 2.8 mmol/L — ABNORMAL HIGH (ref 0.5–2.2)

## 2014-04-06 LAB — OCCULT BLOOD X 1 CARD TO LAB, STOOL: Fecal Occult Bld: NEGATIVE

## 2014-04-06 MED ORDER — CHLORHEXIDINE GLUCONATE 0.12 % MT SOLN
15.0000 mL | Freq: Two times a day (BID) | OROMUCOSAL | Status: DC
Start: 2014-04-06 — End: 2014-04-10
  Administered 2014-04-06 – 2014-04-10 (×8): 15 mL via OROMUCOSAL
  Filled 2014-04-06 (×11): qty 15

## 2014-04-06 MED ORDER — MUPIROCIN 2 % EX OINT
1.0000 "application " | TOPICAL_OINTMENT | Freq: Two times a day (BID) | CUTANEOUS | Status: DC
  Administered 2014-04-06 – 2014-04-09 (×8): 1 via NASAL

## 2014-04-06 MED ORDER — MUPIROCIN 2 % EX OINT
TOPICAL_OINTMENT | CUTANEOUS | Status: AC
  Filled 2014-04-06: qty 22

## 2014-04-06 MED ORDER — FLUVOXAMINE MALEATE 100 MG PO TABS
300.0000 mg | ORAL_TABLET | Freq: Every day | ORAL | Status: DC
  Administered 2014-04-07 – 2014-04-09 (×4): 300 mg
  Filled 2014-04-06 (×5): qty 3

## 2014-04-06 MED ORDER — SODIUM POLYSTYRENE SULFONATE 15 GM/60ML PO SUSP
60.0000 g | Freq: Once | ORAL | Status: AC
  Administered 2014-04-06: 60 g
  Filled 2014-04-06: qty 240

## 2014-04-06 MED ORDER — CLOZAPINE 25 MG PO TABS
125.0000 mg | ORAL_TABLET | Freq: Two times a day (BID) | ORAL | Status: DC
  Administered 2014-04-06 – 2014-04-10 (×9): 125 mg
  Filled 2014-04-06 (×10): qty 1

## 2014-04-06 MED ORDER — CHLORHEXIDINE GLUCONATE CLOTH 2 % EX PADS
6.0000 | MEDICATED_PAD | Freq: Every day | CUTANEOUS | Status: AC
  Administered 2014-04-06 – 2014-04-10 (×5): 6 via TOPICAL

## 2014-04-06 MED ORDER — PEG 3350-KCL-NA BICARB-NACL 420 G PO SOLR
4000.0000 mL | Freq: Once | ORAL | Status: AC
  Administered 2014-04-06: 4000 mL
  Filled 2014-04-06: qty 4000

## 2014-04-06 MED ORDER — SODIUM CHLORIDE 0.9 % IV BOLUS (SEPSIS)
1000.0000 mL | Freq: Once | INTRAVENOUS | Status: AC
  Administered 2014-04-06: 1000 mL via INTRAVENOUS

## 2014-04-06 MED ORDER — CETYLPYRIDINIUM CHLORIDE 0.05 % MT LIQD
7.0000 mL | Freq: Two times a day (BID) | OROMUCOSAL | Status: DC
  Administered 2014-04-07 – 2014-04-08 (×4): 7 mL via OROMUCOSAL

## 2014-04-06 NOTE — Progress Notes (Signed)
UR Completed.  [name] [phone] 04/06/2014

## 2014-04-06 NOTE — Progress Notes (Signed)
CRITICAL VALUE ALERT  Critical value received:  + MRSA nasal swab  Date of notification:  04/06/2014  Time of notification:  1:43 AM  Critical value read back:Yes.    Nurse who received alert:  Jobin Montelongo, RN   MD notified (1st page):  Dr. Jimmy Footman  Time of first page:  1:44 AM  MD notified (2nd page):  Time of second page:  Responding MD:  Dr. Jimmy Footman  Time MD responded:  1:44 AM

## 2014-04-06 NOTE — Progress Notes (Signed)
PULMONARY / CRITICAL CARE MEDICINE   Name: [name] Branch MRN: [id number] DOB: 1966/06/16    ADMISSION DATE:  04/05/2014 CONSULTATION DATE:  04/05/2014   REFERRING MD :  ER  CHIEF COMPLAINT:  hypotension  INITIAL PRESENTATION: 48 y/o pt with schizophernia, developmental delay, history of chronic constipation who presented to ED with 1 day of profuse diarrhea and weakness.   STUDIES:  CT abd - Large recto-sigmoid fecal impaction with ureteral obstruction and resultant hydronephrosis.   SIGNIFICANT EVENTS: Evaluated by surgery, no intervention at this time, GI consulted, recommendations noted.     SUBJECTIVE:  Somnolent, NAD  VITAL SIGNS: Temp:  [98.1 F (36.7 C)-99.8 F (37.7 C)] 99.8 F (37.7 C) (09/07 1544) Pulse Rate:  [38-132] 116 (09/07 1700) Resp:  [14-30] 15 (09/07 1700) BP: (67-147)/(33-117) 104/57 mmHg (09/07 1700) SpO2:  [94 %-100 %] 95 % (09/07 1700) Weight:  [83.1 kg (183 lb 3.2 oz)] 83.1 kg (183 lb 3.2 oz) (09/07 0130) HEMODYNAMICS:   VENTILATOR SETTINGS:   INTAKE / OUTPUT:  Intake/Output Summary (Last 24 hours) at 04/06/14 1751 Last data filed at 04/06/14 1700  Gross per 24 hour  Intake 9757.72 ml  Output   5390 ml  Net 4367.72 ml    PHYSICAL EXAMINATION: General:  NAD Neuro: RASS -2, MAEs HEENT: WNL Cardiovascular:  Reg, no M Lungs:  CTAB Abdomen:  Distended, diminished bowel sounds, minimally tender to palpation.  Ext: no edema   LABS: I have reviewed all of today's lab results. Relevant abnormalities are discussed in the A/P section  CXR:  NACPD  ASSESSMENT / PLAN:  PULMONARY A:No current issues P:     CARDIOVASCULAR CVL: Right subclavian triple lumen catheter.  A: Hypotension - likely hypovolemia P:  Cont volume resuscitation Wean NE to off for MAP > 60 mmHg  RENAL A:  AKI, Cr improving Hyperkalemia, resolved P:   Monitor BMET intermittently Monitor I/Os Correct electrolytes as indicated  GASTROINTESTINAL A:  Severe  fecal impaction P:   Eval and mgmt per GI and CCS  HEMATOLOGIC A:   Hemoconcentration - improved P:  DVT px: SQ heparin Monitor CBC intermittently Transfuse per usual ICU guidelines  INFECTIOUS A:  SIRS - possible translocation of gut flora P:   BCx2 04/05/2014 UC 04/05/2014  Abx: vanc/zosyn, start date 9/6 - continue pending culture results.    NEUROLOGIC A:  History of schizophrenia History of mental retardation P:   RASS goal: 0 Continue home antipsychotics  TODAY'S SUMMARY:   Merton Border, MD ; [facility] service Mobile [phone].  After 5:30 PM or weekends, call [phone]   04/06/2014, 5:51 PM

## 2014-04-06 NOTE — Consult Note (Addendum)
Cross cover [facility] Reason for Consult: Fecal impaction on CT/Severe diarrhea/Hypotension. Referring Physician: ER-MD  [name] Branch is an 48 y.o. male.  HPI: 48 year old black male with a history of schizophrenia, mental retardation and developmental delay, has been institutionalized for over 42 years-known to have history of chronic constipation was brought to the ER yesterday with profuse diarrhea without any obvious rectal bleeding. He has a history of severe colonic inertia and as per his [facility] care taker he has been followed by [facility] for his chronic GI issues for over 20 years. He tends to have overflow incontinence when his fecal impaction is severe. His care giver reports he was behaving normally yesterday and began having diarrhea last night. Reportedly he has a BM every 2-3 days on a extensive bowel regimen. He was hospitalized in 2007 for similar issues. Reportedly he has had normal appetite and his weight has bene stable. In ED, he was hypotensive with acute kidney injury, elevated lactic acid, severe dehydration, and hyperkalemia. Had CT that showed massive rectosigmoid fecal impaction causing bilaterally hydronephrosis. He was started on enemas and Miralax vis the NGT after admission.    Past Medical History  Diagnosis Date  . Colonic inertia   . Hyperlipidemia   . Abscess of anal and rectal regions   . OSA (obstructive sleep apnea)   . Hypertension   . Constipation   . Sarcoidosis   . 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 Schizophrenia   . Mental retardation   . Cardiomyopathy   . Glaucoma    Past Surgical History  Procedure Laterality Date  . Groin surgery     Family History  Problem Relation Age of Onset  . Diabetes Mother   . Colon cancer Neg Hx    Social History:  reports that he has never smoked. He has never used smokeless tobacco. He reports that he does not drink alcohol or use illicit drugs.  Allergies: No Known Allergies  Medications: I have reviewed  the patient's current medications.  Results for orders placed during the hospital encounter of 04/05/14 (from the past 48 hour(s))  CBC WITH DIFFERENTIAL     Status: Abnormal   Collection Time    04/05/14  2:29 PM      Result Value Ref Range   WBC 15.8 (*) 4.0 - 10.5 K/uL   Comment: WHITE COUNT CONFIRMED ON SMEAR   RBC 6.35 (*) 4.22 - 5.81 MIL/uL   Hemoglobin 19.8 (*) 13.0 - 17.0 g/dL   HCT 56.7 (*) 39.0 - 52.0 %   MCV 89.3  78.0 - 100.0 fL   MCH 31.2  26.0 - 34.0 pg   MCHC 34.9  30.0 - 36.0 g/dL   RDW 15.0  11.5 - 15.5 %   Platelets 161  150 - 400 K/uL   Comment: PLATELET COUNT CONFIRMED BY SMEAR   Neutrophils Relative % 83 (*) 43 - 77 %   Lymphocytes Relative 12  12 - 46 %   Monocytes Relative 5  3 - 12 %   Eosinophils Relative 0  0 - 5 %   Basophils Relative 0  0 - 1 %   Neutro Abs 13.1 (*) 1.7 - 7.7 K/uL   Lymphs Abs 1.9  0.7 - 4.0 K/uL   Monocytes Absolute 0.8  0.1 - 1.0 K/uL   Eosinophils Absolute 0.0  0.0 - 0.7 K/uL   Basophils Absolute 0.0  0.0 - 0.1 K/uL  I-STAT CG4 LACTIC ACID, ED  Status: Abnormal   Collection Time    04/05/14  2:54 PM      Result Value Ref Range   Lactic Acid, Venous 5.50 (*) 0.5 - 2.2 mmol/L  POC OCCULT BLOOD, ED     Status: Abnormal   Collection Time    04/05/14  3:13 PM      Result Value Ref Range   Fecal Occult Bld POSITIVE (*) NEGATIVE  COMPREHENSIVE METABOLIC PANEL     Status: Abnormal   Collection Time    04/05/14  3:20 PM      Result Value Ref Range   Sodium 135 (*) 137 - 147 mEq/L   Potassium >7.7 (*) 3.7 - 5.3 mEq/L   Comment: CRITICAL RESULT CALLED TO, READ BACK BY AND VERIFIED WITH:     A.THOMPSON RN [phone] 04/05/14 E.GADDY     SLIGHT HEMOLYSIS   Chloride 100  96 - 112 mEq/L   CO2 20  19 - 32 mEq/L   Glucose, Bld 106 (*) 70 - 99 mg/dL   BUN 43 (*) 6 - 23 mg/dL   Creatinine, Ser 1.73 (*) 0.50 - 1.35 mg/dL   Calcium 9.3  8.4 - 10.5 mg/dL   Total Protein 7.3  6.0 - 8.3 g/dL   Albumin 3.9  3.5 - 5.2 g/dL   AST 65 (*) 0 - 37  U/L   Comment: HEMOLYSIS AT THIS LEVEL MAY AFFECT RESULT   ALT 27  0 - 53 U/L   Alkaline Phosphatase 99  39 - 117 U/L   Total Bilirubin 1.2  0.3 - 1.2 mg/dL   GFR calc non Af Amer 45 (*) >90 mL/min   GFR calc Af Amer 52 (*) >90 mL/min   Comment: (NOTE)     The eGFR has been calculated using the CKD EPI equation.     This calculation has not been validated in all clinical situations.     eGFR's persistently <90 mL/min signify possible Chronic Kidney     Disease.   Anion gap 15  5 - 15  LIPASE, BLOOD     Status: None   Collection Time    04/05/14  3:20 PM      Result Value Ref Range   Lipase 24  11 - 59 U/L  I-STAT CG4 LACTIC ACID, ED     Status: Abnormal   Collection Time    04/05/14  3:26 PM      Result Value Ref Range   Lactic Acid, Venous 5.54 (*) 0.5 - 2.2 mmol/L  BASIC METABOLIC PANEL     Status: Abnormal   Collection Time    04/05/14  8:25 PM      Result Value Ref Range   Sodium 134 (*) 137 - 147 mEq/L   Potassium >7.7 (*) 3.7 - 5.3 mEq/L   Comment: NO VISIBLE HEMOLYSIS     CRITICAL RESULT CALLED TO, READ BACK BY AND VERIFIED WITH:     ALBRIGHT [facility] 04/05/14 2120 WAYK   Chloride 103  96 - 112 mEq/L   CO2 18 (*) 19 - 32 mEq/L   Glucose, Bld 127 (*) 70 - 99 mg/dL   BUN 50 (*) 6 - 23 mg/dL   Creatinine, Ser 1.99 (*) 0.50 - 1.35 mg/dL   Calcium 8.2 (*) 8.4 - 10.5 mg/dL   GFR calc non Af Amer 38 (*) >90 mL/min   GFR calc Af Amer 44 (*) >90 mL/min   Comment: (NOTE)     The eGFR has been calculated using  the CKD EPI equation.     This calculation has not been validated in all clinical situations.     eGFR's persistently <90 mL/min signify possible Chronic Kidney     Disease.   Anion gap 13  5 - 15  GLUCOSE, CAPILLARY     Status: Abnormal   Collection Time    04/05/14 10:06 PM      Result Value Ref Range   Glucose-Capillary 171 (*) 70 - 99 mg/dL  MRSA PCR SCREENING     Status: Abnormal   Collection Time    04/05/14 10:20 PM      Result Value Ref Range   MRSA by PCR  POSITIVE (*) NEGATIVE   Comment:            The GeneXpert MRSA Assay (FDA     approved for NASAL specimens     only), is one component of a     comprehensive MRSA colonization     surveillance program. It is not     intended to diagnose MRSA     infection nor to guide or     monitor treatment for     MRSA infections.     RESULT CALLED TO, READ BACK BY AND VERIFIED WITH:     SHEPHERD,B RN [id number] 04/06/14 MITCHELL,L  BLOOD GAS, ARTERIAL     Status: Abnormal   Collection Time    04/05/14 10:59 PM      Result Value Ref Range   FIO2 0.21     pH, Arterial 7.344 (*) 7.350 - 7.450   pCO2 arterial 32.1 (*) 35.0 - 45.0 mmHg   pO2, Arterial 88.9  80.0 - 100.0 mmHg   Bicarbonate 17.0 (*) 20.0 - 24.0 mEq/L   TCO2 18.0  0 - 100 mmol/L   Acid-base deficit 7.6 (*) 0.0 - 2.0 mmol/L   O2 Saturation 95.7     Patient temperature 98.6     Collection site RIGHT RADIAL     Drawn by [id number]     Sample type ARTERIAL DRAW     Allens test (pass/fail) PASS  PASS  URINALYSIS, ROUTINE W REFLEX MICROSCOPIC     Status: Abnormal   Collection Time    04/06/14  1:11 AM      Result Value Ref Range   Color, Urine AMBER (*) YELLOW   Comment: BIOCHEMICALS MAY BE AFFECTED BY COLOR   APPearance CLOUDY (*) CLEAR   Specific Gravity, Urine 1.028  1.005 - 1.030   pH 5.0  5.0 - 8.0   Glucose, UA NEGATIVE  NEGATIVE mg/dL   Hgb urine dipstick NEGATIVE  NEGATIVE   Bilirubin Urine LARGE (*) NEGATIVE   Ketones, ur 15 (*) NEGATIVE mg/dL   Protein, ur NEGATIVE  NEGATIVE mg/dL   Urobilinogen, UA 1.0  0.0 - 1.0 mg/dL   Nitrite POSITIVE (*) NEGATIVE   Leukocytes, UA MODERATE (*) NEGATIVE  URINE MICROSCOPIC-ADD ON     Status: Abnormal   Collection Time    04/06/14  1:11 AM      Result Value Ref Range   Squamous Epithelial / LPF FEW (*) RARE   WBC, UA 0-2  <3 WBC/hpf   RBC / HPF 0-2  <3 RBC/hpf   Bacteria, UA FEW (*) RARE   Casts HYALINE CASTS (*) NEGATIVE  BASIC METABOLIC PANEL     Status: Abnormal   Collection Time     04/06/14  1:15 AM      Result Value Ref Range   Sodium 135 (*)  137 - 147 mEq/L   Potassium 6.2 (*) 3.7 - 5.3 mEq/L   Comment: DELTA CHECK NOTED   Chloride 111  96 - 112 mEq/L   CO2 16 (*) 19 - 32 mEq/L   Glucose, Bld 138 (*) 70 - 99 mg/dL   BUN 43 (*) 6 - 23 mg/dL   Creatinine, Ser 1.63 (*) 0.50 - 1.35 mg/dL   Calcium 6.6 (*) 8.4 - 10.5 mg/dL   GFR calc non Af Amer 49 (*) >90 mL/min   GFR calc Af Amer 56 (*) >90 mL/min   Comment: (NOTE)     The eGFR has been calculated using the CKD EPI equation.     This calculation has not been validated in all clinical situations.     eGFR's persistently <90 mL/min signify possible Chronic Kidney     Disease.   Anion gap 8  5 - 15  OCCULT BLOOD X 1 CARD TO LAB, STOOL     Status: None   Collection Time    04/06/14  2:55 AM      Result Value Ref Range   Fecal Occult Bld NEGATIVE  NEGATIVE  LACTIC ACID, PLASMA     Status: Abnormal   Collection Time    04/06/14  4:49 AM      Result Value Ref Range   Lactic Acid, Venous 2.8 (*) 0.5 - 2.2 mmol/L  CBC     Status: Abnormal   Collection Time    04/06/14  4:50 AM      Result Value Ref Range   WBC 11.3 (*) 4.0 - 10.5 K/uL   RBC 5.27  4.22 - 5.81 MIL/uL   Hemoglobin 15.9  13.0 - 17.0 g/dL   Comment: REPEATED TO VERIFY   HCT 46.4  39.0 - 52.0 %   MCV 88.0  78.0 - 100.0 fL   MCH 30.2  26.0 - 34.0 pg   MCHC 34.3  30.0 - 36.0 g/dL   RDW 14.4  11.5 - 15.5 %   Platelets 146 (*) 150 - 400 K/uL  BASIC METABOLIC PANEL     Status: Abnormal   Collection Time    04/06/14  4:50 AM      Result Value Ref Range   Sodium 136 (*) 137 - 147 mEq/L   Potassium 6.0 (*) 3.7 - 5.3 mEq/L   Chloride 109  96 - 112 mEq/L   CO2 18 (*) 19 - 32 mEq/L   Glucose, Bld 177 (*) 70 - 99 mg/dL   BUN 40 (*) 6 - 23 mg/dL   Creatinine, Ser 1.60 (*) 0.50 - 1.35 mg/dL   Calcium 7.4 (*) 8.4 - 10.5 mg/dL   GFR calc non Af Amer 50 (*) >90 mL/min   GFR calc Af Amer 58 (*) >90 mL/min   Comment: (NOTE)     The eGFR has been  calculated using the CKD EPI equation.     This calculation has not been validated in all clinical situations.     eGFR's persistently <90 mL/min signify possible Chronic Kidney     Disease.   Anion gap 9  5 - 15  MAGNESIUM     Status: Abnormal   Collection Time    04/06/14  4:50 AM      Result Value Ref Range   Magnesium 4.2 (*) 1.5 - 2.5 mg/dL   Ct Abdomen Pelvis Wo Contrast  04/05/2014   CLINICAL DATA:  Abdominal pain. Sepsis. Diarrhea. Splenomegaly. Sarcoidosis.  EXAM: CT ABDOMEN AND PELVIS  WITHOUT CONTRAST  TECHNIQUE: Multidetector CT imaging of the abdomen and pelvis was performed following the standard protocol without IV contrast.  COMPARISON:  None.  FINDINGS: Kidneys/Urinary tract: No evidence of urolithiasis. Severe bilateral hydronephrosis and ureterectasis is seen due to compression by severely dilated rectosigmoid colon containing stool.  Liver: No mass or other abnormality visualized on this non-contrast exam.  Gallbladder/Biliary:  Unremarkable.  Pancreas: No mass or inflammatory process visualized on this non-contrast exam.  Spleen:  Within normal limits in size.  Adrenal Glands:  No mass identified.  Lymph Nodes:  No pathologically enlarged lymph nodes identified.  Pelvic/Reproductive Organs: Bladder and prostate displaced bites freely dilated rectosigmoid colon containing stool.  Bowel/Peritoneum: Severe dilatation of rectosigmoid colon is seen containing large amount of stool. Proximal colon and small bowel remain nondilated.  Vascular:  No evidence of abdominal aortic aneurysm.  Musculoskeletal:  No suspicious bone lesions identified.  Other: Images through the lung bases show mild bibasilar scarring. A 6 mm noncalcified pulmonary nodules also seen in the inferior aspect of the right middle lobe on image 4, which is indeterminate.  IMPRESSION: Severely dilated rectosigmoid colon filled with stool, consistent with fecal impaction. This results in bilateral of distal ureteral  obstruction and severe bilateral hydronephrosis.  6 mm indeterminate pulmonary nodule noted in the inferior aspect of the right middle lobe. If the patient is at high risk for bronchogenic carcinoma, follow-up chest CT at 6-12 months is recommended. If the patient is at low risk for bronchogenic carcinoma, follow-up chest CT at 12 months is recommended. This recommendation follows the consensus statement: Guidelines for Management of Small Pulmonary Nodules Detected on CT Scans: A Statement from the [employer] as published in Radiology 2005;237:395-400.   Electronically Signed   By: Earle Gell M.D.   On: 04/05/2014 18:03   Dg Chest Portable 1 View  04/05/2014   CLINICAL DATA:  Central line placement.  EXAM: PORTABLE CHEST - 1 VIEW  COMPARISON:  04/05/2014 at 17:56 p.m.  FINDINGS: There has been placement of a right subclavian central venous catheter with tip overlying the region of the cavoatrial junction. Patient is slightly rotated to the left. Lungs are adequately inflated without consolidation or effusion. There is no evidence of pneumothorax. Cardiomediastinal silhouette and remainder the exam is unchanged.  IMPRESSION: No acute cardiopulmonary disease.  Right subclavian central venous catheter with tip overlying the region of the cavoatrial junction. No pneumothorax.   Electronically Signed   By: Marin Olp M.D.   On: 04/05/2014 20:41   Dg Abd Acute W/chest  04/05/2014   CLINICAL DATA:  Chronic constipation and diarrhea. Rule out obstruction.  EXAM: ACUTE ABDOMEN SERIES (ABDOMEN 2 VIEW & CHEST 1 VIEW)  COMPARISON:  CT today and chest x-ray 01/02/2006 with abdominal films 12/01/2008  FINDINGS: Patient is rotated to the right. Lungs are otherwise adequately inflated without consolidation or effusion. Cardiomediastinal silhouette and remainder of the chest is unchanged. There is no free peritoneal air.  Abdominal images demonstrate evidence of patient's known severe dilatation of the rectosigmoid  colon extending into the right abdomen with moderate fecal retention. No dilated small bowel loops. Degenerative changes of the spine and hips.  IMPRESSION: Evidence of patient's severely dilated rectosigmoid colon extending into the right abdomen with moderate fecal retention as seen on the CT scan from earlier today. No dilated small bowel loops. No free air.  No acute cardiopulmonary disease.   Electronically Signed   By: Marin Olp M.D.   On: 04/05/2014  18:23   Dg Abd Portable 1v  04/06/2014   CLINICAL DATA:  NG tube placement  EXAM: PORTABLE ABDOMEN - 1 VIEW  COMPARISON:  04/05/2014  FINDINGS: Enteric tube terminates in the gastric cardia.  IMPRESSION: Enteric tube terminates in the gastric cardia.   Electronically Signed   By: Julian Hy M.D.   On: 04/06/2014 01:32   Dg Abd Portable 1v  04/06/2014   CLINICAL DATA:  NG tube placement  EXAM: PORTABLE ABDOMEN - 1 VIEW  COMPARISON:  04/05/2014 at 1756 hr  FINDINGS: Enteric tube terminates in the gastric cardia with its sideport in the distal esophagus.  Moderate to large stool in the distal sigmoid/ rectum.  IMPRESSION: Enteric tube terminates in the gastric cardia with side port in the distal esophagus.  Moderate to large stool in the distal sigmoid/rectum, suggesting fecal impaction.   Electronically Signed   By: Julian Hy M.D.   On: 04/06/2014 00:21   Review of Systems  Unable to perform ROS  Blood pressure 92/55, pulse 114, temperature 98.1 F (36.7 C), temperature source Axillary, resp. rate 21, weight 83.1 kg (183 lb 3.2 oz), SpO2 98.00%. Physical Exam  Constitutional: He appears well-developed and well-nourished.  HENT:  Head: Normocephalic and atraumatic.  Eyes: EOM are normal.  Neck: Neck supple.  Cardiovascular: Normal rate and regular rhythm.   Respiratory: Effort normal and breath sounds normal.  GI: Soft. He exhibits distension. He exhibits no mass. Bowel sounds are decreased. There is tenderness. There is  guarding.  Skin: Skin is warm and dry.   Assessment/Plan: 1) Severe colonic inertia with chronic constipation presenting with ?/overflow incontinence-abnormal CT scan revealing massive fecal impaction in the rectosigmoid colon causing extensive dilation of the rectosigmoid colon resulting in hydronephrosis bilaterally-seems to be improving on Miralax and enemas with magnesium citrate. Stool negative for C. Difficile toxin assay  Continue present care.   2) Hypertension/Hyperlipdemia/Cardiomyopathy. 3) Sarcoidosis.  4) OSA.  5) Schizophrenia/MR. Lakshmi Sundeen 04/06/2014, 8:57 AM

## 2014-04-06 NOTE — Progress Notes (Signed)
Agree with above. No surgical intervention at this time.  Imogene Burn. Georgette Dover, MD, [facility] Surgery  General/ Trauma Surgery  04/06/2014 9:18 AM

## 2014-04-06 NOTE — Progress Notes (Signed)
Patient ID: [name] Branch, male   DOB: 12-21-1965, 48 y.o.   MRN: [id number]    Subjective: Pt awake.  No complaints.    Objective: Vital signs in last 24 hours: Temp:  [97.7 F (36.5 C)-99 F (37.2 C)] 98.1 F (36.7 C) (09/07 0840) Pulse Rate:  [25-118] 114 (09/07 0700) Resp:  [14-30] 21 (09/07 0700) BP: (67-106)/(33-74) 92/55 mmHg (09/07 0700) SpO2:  [95 %-100 %] 98 % (09/07 0700) Weight:  [183 lb 3.2 oz (83.1 kg)] 183 lb 3.2 oz (83.1 kg) (09/07 0130) Last BM Date: 04/05/14  Intake/Output from previous day: 09/06 0701 - 09/07 0700 In: 8285.2 [I.V.:2995.2; NG/GT:1080; IV Piggyback:3250] Out: 1490 [Urine:790; Stool:700] Intake/Output this shift: Total I/O In: 343.8 [I.V.:143.8; IV Piggyback:200] Out: 400 [Urine:200; Stool:200]  PE: Abd: soft, mildly tender, NGT in place with golytely prep running through it.  flexi-seal in place  Lab Results:   Recent Labs  04/05/14 1429 04/06/14 0450  WBC 15.8* 11.3*  HGB 19.8* 15.9  HCT 56.7* 46.4  PLT 161 146*   BMET  Recent Labs  04/06/14 0115 04/06/14 0450  NA 135* 136*  K 6.2* 6.0*  CL 111 109  CO2 16* 18*  GLUCOSE 138* 177*  BUN 43* 40*  CREATININE 1.63* 1.60*  CALCIUM 6.6* 7.4*   PT/INR No results found for this basename: LABPROT, INR,  in the last 72 hours CMP     Component Value Date/Time   NA 136* 04/06/2014 0450   K 6.0* 04/06/2014 0450   CL 109 04/06/2014 0450   CO2 18* 04/06/2014 0450   GLUCOSE 177* 04/06/2014 0450   BUN 40* 04/06/2014 0450   CREATININE 1.60* 04/06/2014 0450   CREATININE 1.01 09/02/2012 1022   CALCIUM 7.4* 04/06/2014 0450   PROT 7.3 04/05/2014 1520   ALBUMIN 3.9 04/05/2014 1520   AST 65* 04/05/2014 1520   ALT 27 04/05/2014 1520   ALKPHOS 99 04/05/2014 1520   BILITOT 1.2 04/05/2014 1520   GFRNONAA 50* 04/06/2014 0450   GFRNONAA 89 09/02/2012 1022   GFRAA 58* 04/06/2014 0450   GFRAA >89 09/02/2012 1022   Lipase     Component Value Date/Time   LIPASE 24 04/05/2014 1520       Studies/Results: Ct Abdomen  Pelvis Wo Contrast  04/05/2014   CLINICAL DATA:  Abdominal pain. Sepsis. Diarrhea. Splenomegaly. Sarcoidosis.  EXAM: CT ABDOMEN AND PELVIS WITHOUT CONTRAST  TECHNIQUE: Multidetector CT imaging of the abdomen and pelvis was performed following the standard protocol without IV contrast.  COMPARISON:  None.  FINDINGS: Kidneys/Urinary tract: No evidence of urolithiasis. Severe bilateral hydronephrosis and ureterectasis is seen due to compression by severely dilated rectosigmoid colon containing stool.  Liver: No mass or other abnormality visualized on this non-contrast exam.  Gallbladder/Biliary:  Unremarkable.  Pancreas: No mass or inflammatory process visualized on this non-contrast exam.  Spleen:  Within normal limits in size.  Adrenal Glands:  No mass identified.  Lymph Nodes:  No pathologically enlarged lymph nodes identified.  Pelvic/Reproductive Organs: Bladder and prostate displaced bites freely dilated rectosigmoid colon containing stool.  Bowel/Peritoneum: Severe dilatation of rectosigmoid colon is seen containing large amount of stool. Proximal colon and small bowel remain nondilated.  Vascular:  No evidence of abdominal aortic aneurysm.  Musculoskeletal:  No suspicious bone lesions identified.  Other: Images through the lung bases show mild bibasilar scarring. A 6 mm noncalcified pulmonary nodules also seen in the inferior aspect of the right middle lobe on image 4, which is indeterminate.  IMPRESSION: Severely  dilated rectosigmoid colon filled with stool, consistent with fecal impaction. This results in bilateral of distal ureteral obstruction and severe bilateral hydronephrosis.  6 mm indeterminate pulmonary nodule noted in the inferior aspect of the right middle lobe. If the patient is at high risk for bronchogenic carcinoma, follow-up chest CT at 6-12 months is recommended. If the patient is at low risk for bronchogenic carcinoma, follow-up chest CT at 12 months is recommended. This recommendation  follows the consensus statement: Guidelines for Management of Small Pulmonary Nodules Detected on CT Scans: A Statement from the [place] as published in Radiology 2005;237:395-400.   Electronically Signed   By: Earle Gell M.D.   On: 04/05/2014 18:03   Dg Chest Portable 1 View  04/05/2014   CLINICAL DATA:  Central line placement.  EXAM: PORTABLE CHEST - 1 VIEW  COMPARISON:  04/05/2014 at 17:56 p.m.  FINDINGS: There has been placement of a right subclavian central venous catheter with tip overlying the region of the cavoatrial junction. Patient is slightly rotated to the left. Lungs are adequately inflated without consolidation or effusion. There is no evidence of pneumothorax. Cardiomediastinal silhouette and remainder the exam is unchanged.  IMPRESSION: No acute cardiopulmonary disease.  Right subclavian central venous catheter with tip overlying the region of the cavoatrial junction. No pneumothorax.   Electronically Signed   By: Marin Olp M.D.   On: 04/05/2014 20:41   Dg Abd Acute W/chest  04/05/2014   CLINICAL DATA:  Chronic constipation and diarrhea. Rule out obstruction.  EXAM: ACUTE ABDOMEN SERIES (ABDOMEN 2 VIEW & CHEST 1 VIEW)  COMPARISON:  CT today and chest x-ray 01/02/2006 with abdominal films 12/01/2008  FINDINGS: Patient is rotated to the right. Lungs are otherwise adequately inflated without consolidation or effusion. Cardiomediastinal silhouette and remainder of the chest is unchanged. There is no free peritoneal air.  Abdominal images demonstrate evidence of patient's known severe dilatation of the rectosigmoid colon extending into the right abdomen with moderate fecal retention. No dilated small bowel loops. Degenerative changes of the spine and hips.  IMPRESSION: Evidence of patient's severely dilated rectosigmoid colon extending into the right abdomen with moderate fecal retention as seen on the CT scan from earlier today. No dilated small bowel loops. No free air.  No acute  cardiopulmonary disease.   Electronically Signed   By: Marin Olp M.D.   On: 04/05/2014 18:23   Dg Abd Portable 1v  04/06/2014   CLINICAL DATA:  NG tube placement  EXAM: PORTABLE ABDOMEN - 1 VIEW  COMPARISON:  04/05/2014  FINDINGS: Enteric tube terminates in the gastric cardia.  IMPRESSION: Enteric tube terminates in the gastric cardia.   Electronically Signed   By: Julian Hy M.D.   On: 04/06/2014 01:32   Dg Abd Portable 1v  04/06/2014   CLINICAL DATA:  NG tube placement  EXAM: PORTABLE ABDOMEN - 1 VIEW  COMPARISON:  04/05/2014 at 1756 hr  FINDINGS: Enteric tube terminates in the gastric cardia with its sideport in the distal esophagus.  Moderate to large stool in the distal sigmoid/ rectum.  IMPRESSION: Enteric tube terminates in the gastric cardia with side port in the distal esophagus.  Moderate to large stool in the distal sigmoid/rectum, suggesting fecal impaction.   Electronically Signed   By: Julian Hy M.D.   On: 04/06/2014 00:21    Anti-infectives: Anti-infectives   Start     Dose/Rate Route Frequency Ordered Stop   04/05/14 1800  vancomycin (VANCOCIN) IVPB 1000 mg/200 mL premix  1,000 mg 200 mL/hr over 60 Minutes Intravenous Every 12 hours 04/05/14 1711     04/05/14 1800  piperacillin-tazobactam (ZOSYN) IVPB 3.375 g     3.375 g 12.5 mL/hr over 240 Minutes Intravenous Every 8 hours 04/05/14 1711         Assessment/Plan  1. MR 2. Chronic constipation with massive stool burden at rectosigmoid junction  Plan: 1. No plans for surgical intervention.  Agree with current plans with enemas and golytely.  Concerned that with the placement of a flexi-seal that it will block and prevent him from being able to pass this large stool burden.  May want to consider discontinuation of flexi-seal while trying to disimpact the patient.    LOS: 1 day    Damira Kem E 04/06/2014, 8:44 AM Pager: [phone]

## 2014-04-07 DIAGNOSIS — K5900 Constipation, unspecified: Secondary | ICD-10-CM

## 2014-04-07 DIAGNOSIS — R933 Abnormal findings on diagnostic imaging of other parts of digestive tract: Secondary | ICD-10-CM

## 2014-04-07 LAB — CBC
HEMATOCRIT: 38.6 % — AB (ref 39.0–52.0)
Hemoglobin: 13.7 g/dL (ref 13.0–17.0)
MCH: 30.4 pg (ref 26.0–34.0)
MCHC: 35.5 g/dL (ref 30.0–36.0)
MCV: 85.6 fL (ref 78.0–100.0)
Platelets: 127 10*3/uL — ABNORMAL LOW (ref 150–400)
RBC: 4.51 MIL/uL (ref 4.22–5.81)
RDW: 14.5 % (ref 11.5–15.5)
WBC: 7.8 10*3/uL (ref 4.0–10.5)

## 2014-04-07 LAB — COMPREHENSIVE METABOLIC PANEL
ALT: 30 U/L (ref 0–53)
ANION GAP: 12 (ref 5–15)
AST: 47 U/L — ABNORMAL HIGH (ref 0–37)
Albumin: 2.1 g/dL — ABNORMAL LOW (ref 3.5–5.2)
Alkaline Phosphatase: 58 U/L (ref 39–117)
BILIRUBIN TOTAL: 0.7 mg/dL (ref 0.3–1.2)
BUN: 25 mg/dL — AB (ref 6–23)
CHLORIDE: 111 meq/L (ref 96–112)
CO2: 22 mEq/L (ref 19–32)
CREATININE: 1.25 mg/dL (ref 0.50–1.35)
Calcium: 6.7 mg/dL — ABNORMAL LOW (ref 8.4–10.5)
GFR calc Af Amer: 78 mL/min — ABNORMAL LOW (ref >90)
GFR calc non Af Amer: 67 mL/min — ABNORMAL LOW (ref >90)
Glucose, Bld: 146 mg/dL — ABNORMAL HIGH (ref 70–99)
Potassium: 4.4 mEq/L (ref 3.7–5.3)
Sodium: 145 mEq/L (ref 137–147)
TOTAL PROTEIN: 4.7 g/dL — AB (ref 6.0–8.3)

## 2014-04-07 LAB — PROCALCITONIN: Procalcitonin: 25.99 ng/mL

## 2014-04-07 MED ORDER — METOPROLOL TARTRATE 25 MG/10 ML ORAL SUSPENSION
50.0000 mg | Freq: Two times a day (BID) | ORAL | Status: DC
  Administered 2014-04-07 – 2014-04-08 (×3): 50 mg
  Filled 2014-04-07 (×7): qty 20

## 2014-04-07 MED ORDER — METOPROLOL TARTRATE 50 MG PO TABS
50.0000 mg | ORAL_TABLET | Freq: Two times a day (BID) | ORAL | Status: DC
  Filled 2014-04-07 (×2): qty 1

## 2014-04-07 MED ORDER — METOPROLOL TARTRATE 1 MG/ML IV SOLN: 2.5000 mg | INTRAVENOUS | Status: DC | PRN

## 2014-04-07 MED ORDER — METOPROLOL TARTRATE 1 MG/ML IV SOLN
INTRAVENOUS | Status: AC
  Administered 2014-04-07: 5 mg
  Filled 2014-04-07: qty 5

## 2014-04-07 MED ORDER — PEG 3350-KCL-NA BICARB-NACL 420 G PO SOLR
4000.0000 mL | Freq: Once | ORAL | Status: AC
  Administered 2014-04-07: 4000 mL via ORAL
  Filled 2014-04-07: qty 4000

## 2014-04-07 MED ORDER — DEXTROSE-NACL 5-0.45 % IV SOLN
INTRAVENOUS | Status: DC
  Administered 2014-04-07: 11:00:00 via INTRAVENOUS

## 2014-04-07 NOTE — Progress Notes (Addendum)
PULMONARY / CRITICAL CARE MEDICINE   Name: [name] Branch MRN: [id number] DOB: July 22, 1966    ADMISSION DATE:  04/05/2014 CONSULTATION DATE:  04/05/2014   REFERRING MD :  ER  CHIEF COMPLAINT:  hypotension  INITIAL PRESENTATION: 48 y/o pt with schizophernia, developmental delay, history of chronic constipation who presented to ED with 1 day of profuse diarrhea and weakness.   STUDIES:  CT abd - Large recto-sigmoid fecal impaction with ureteral obstruction and resultant hydronephrosis.   SIGNIFICANT EVENTS: Evaluated by surgery, no intervention at this time, GI consulted, recommendations noted.     SUBJECTIVE:  Somnolent, NAD  VITAL SIGNS: Temp:  [97.7 F (36.5 C)-98.9 F (37.2 C)] 97.7 F (36.5 C) (09/08 2040) Pulse Rate:  [97-117] 97 (09/08 2040) Resp:  [13-24] 16 (09/08 2040) BP: (88-120)/(45-78) 108/57 mmHg (09/08 2040) SpO2:  [96 %-99 %] 98 % (09/08 2040) Weight:  [83.2 kg (183 lb 6.8 oz)] 83.2 kg (183 lb 6.8 oz) (09/08 0500) HEMODYNAMICS:   VENTILATOR SETTINGS:   INTAKE / OUTPUT:  Intake/Output Summary (Last 24 hours) at 04/07/14 2219 Last data filed at 04/07/14 1000  Gross per 24 hour  Intake 1052.51 ml  Output   1985 ml  Net -932.49 ml   No distress. More interactive. Developed AFRVR AM 9/08. Rate easily controlled with metoprolol  PHYSICAL EXAMINATION: General:  NAD Neuro: RASS -2, MAEs HEENT: WNL Cardiovascular:  Reg, no M Lungs:  CTAB Abdomen:  Less distended, diminished bowel sounds, not tender to palpation.  Ext: no edema   LABS: I have reviewed all of today's lab results. Relevant abnormalities are discussed in the A/P section  CXR:  NNF  ASSESSMENT / PLAN:  PULMONARY A:No current issues P:     CARDIOVASCULAR CVL: Right subclavian triple lumen catheter.  A: Hypotension - likely hypovolemia, resolved PAF, new onset 9/08 - rate controlled with metoprolol P:  Cont IVFs Cont scheduled and PRN metoprolol Poor candidate for aggressive  eval Poor candidate for anticoagulation  RENAL A:  AKI, Cr improving Hyperkalemia, resolved P:   Monitor BMET intermittently Monitor I/Os Correct electrolytes as indicated  GASTROINTESTINAL A:  Severe fecal impaction P:   Eval and mgmt per GI and CCS  HEMATOLOGIC A:   Hemoconcentration, resolved P:  DVT px: SQ heparin Monitor CBC intermittently Transfuse per usual ICU guidelines  INFECTIOUS A:  SIRS, elevated PCT - possible translocation of gut flora P:   BCx2 04/05/2014 UC 04/05/2014  Abx: vanc/zosyn, start date 9/6 - continue pending culture results.    NEUROLOGIC A:  History of schizophrenia History of mental retardation P:   RASS goal: 0 Continue home antipsychotics  TODAY'S SUMMARY:  Transfer to med-surg. TRH to assume care as of AM 9/09 and PCCM to sign off  Merton Border, MD ; [facility] [place] [phone].  After 5:30 PM or weekends, call [phone]   04/07/2014, 10:19 PM

## 2014-04-07 NOTE — Progress Notes (Signed)
Daily Rounding Note  04/07/2014, 7:51 AM  LOS: 2 days   SUBJECTIVE:       Still passing liquid brown stool.  No nausea, no abdominal pain.  OBJECTIVE:         Vital signs in last 24 hours:    Temp:  [98.1 F (36.7 C)-99.8 F (37.7 C)] 98.8 F (37.1 C) (09/08 0347) Pulse Rate:  [98-132] 100 (09/08 0700) Resp:  [12-27] 20 (09/08 0700) BP: (77-147)/(45-117) 109/63 mmHg (09/08 0700) SpO2:  [94 %-100 %] 97 % (09/08 0700) Weight:  [83.2 kg (183 lb 6.8 oz)] 83.2 kg (183 lb 6.8 oz) (09/08 0500) Last BM Date: 04/06/14 General: pleasant, cooperative, does not appear toxic.  His caregiver, Joaquim Lai, is at bedside.    Heart: RRR slightly tachy in 1teens Chest: clear, no labored breathing Abdomen: soft, ND, hypoactive BS.  NT.  Still with some fullness in lower belly/pelvis  Extremities: no CCE Neuro/Psych:  Pleasant, cooperative.   Intake/Output from previous day: 09/07 0701 - 09/08 0700 In: 7069.5 [I.V.:2884.5; HF/WY:6378; IV Piggyback:350] Out: 5885 [Urine:1590; Stool:4700]  Intake/Output this shift:    Lab Results:  Recent Labs  04/05/14 1429 04/06/14 0450 04/07/14 0500  WBC 15.8* 11.3* 7.8  HGB 19.8* 15.9 13.7  HCT 56.7* 46.4 38.6*  PLT 161 146* 127*   BMET  Recent Labs  04/06/14 0450 04/06/14 1125 04/07/14 0500  NA 136* 141 145  K 6.0* 5.0 4.4  CL 109 113* 111  CO2 18* 17* 22  GLUCOSE 177* 174* 146*  BUN 40* 37* 25*  CREATININE 1.60* 1.48* 1.25  CALCIUM 7.4* 7.1* 6.7*   LFT  Recent Labs  04/05/14 1520 04/06/14 1125 04/07/14 0500  PROT 7.3 5.1* 4.7*  ALBUMIN 3.9 2.5* 2.1*  AST 65* 66* 47*  ALT 27 33 30  ALKPHOS 99 66 58  BILITOT 1.2 0.8 0.7   PT/INR No results found for this basename: LABPROT, INR,  in the last 72 hours Hepatitis Panel No results found for this basename: HEPBSAG, HCVAB, HEPAIGM, HEPBIGM,  in the last 72 hours  Studies/Results: Ct Abdomen Pelvis Wo  Contrast  04/05/2014   CLINICAL DATA:  Abdominal pain. Sepsis. Diarrhea. Splenomegaly. Sarcoidosis.  EXAM: CT ABDOMEN AND PELVIS WITHOUT CONTRAST  TECHNIQUE: Multidetector CT imaging of the abdomen and pelvis was performed following the standard protocol without IV contrast.  COMPARISON:  None.  FINDINGS: Kidneys/Urinary tract: No evidence of urolithiasis. Severe bilateral hydronephrosis and ureterectasis is seen due to compression by severely dilated rectosigmoid colon containing stool.  Liver: No mass or other abnormality visualized on this non-contrast exam.  Gallbladder/Biliary:  Unremarkable.  Pancreas: No mass or inflammatory process visualized on this non-contrast exam.  Spleen:  Within normal limits in size.  Adrenal Glands:  No mass identified.  Lymph Nodes:  No pathologically enlarged lymph nodes identified.  Pelvic/Reproductive Organs: Bladder and prostate displaced bites freely dilated rectosigmoid colon containing stool.  Bowel/Peritoneum: Severe dilatation of rectosigmoid colon is seen containing large amount of stool. Proximal colon and small bowel remain nondilated.  Vascular:  No evidence of abdominal aortic aneurysm.  Musculoskeletal:  No suspicious bone lesions identified.  Other: Images through the lung bases show mild bibasilar scarring. A 6 mm noncalcified pulmonary nodules also seen in the inferior aspect of the right middle lobe on image 4, which is indeterminate.  IMPRESSION: Severely dilated rectosigmoid colon filled with stool, consistent with fecal impaction. This results in bilateral of distal ureteral obstruction  and severe bilateral hydronephrosis.  6 mm indeterminate pulmonary nodule noted in the inferior aspect of the right middle lobe. If the patient is at high risk for bronchogenic carcinoma, follow-up chest CT at 6-12 months is recommended. If the patient is at low risk for bronchogenic carcinoma, follow-up chest CT at 12 months is recommended. This recommendation follows the  consensus statement: Guidelines for Management of Small Pulmonary Nodules Detected on CT Scans: A Statement from the [employer] as published in Radiology 2005;237:395-400.   Electronically Signed   By: Earle Gell M.D.   On: 04/05/2014 18:03   US Renal  04/06/2014   CLINICAL DATA:  Bilateral hydronephrosis seen on CT.  Sepsis.  EXAM: RENAL/URINARY TRACT ULTRASOUND COMPLETE  COMPARISON:  None.  FINDINGS: Right Kidney:  Length: 9.9 cm. No renal mass visualized. Moderate to severe right hydronephrosis demonstrated as well as proximal ureterectasis.  Left Kidney:  Length: 11.8 cm. No renal mass visualized. Moderate to severe hydronephrosis and proximal ureterectasis demonstrated.  Bladder:  Foley catheter seen in place, and bladder is nearly completely empty.  IMPRESSION: Moderate to severe bilateral hydronephrosis. Etiology not apparent by ultrasound. See recent CT report. Abdomen pelvis   Electronically Signed   By: Earle Gell M.D.   On: 04/06/2014 09:56   Dg Chest Portable 1 View  04/05/2014   CLINICAL DATA:  Central line placement.  EXAM: PORTABLE CHEST - 1 VIEW  COMPARISON:  04/05/2014 at 17:56 p.m.  FINDINGS: There has been placement of a right subclavian central venous catheter with tip overlying the region of the cavoatrial junction. Patient is slightly rotated to the left. Lungs are adequately inflated without consolidation or effusion. There is no evidence of pneumothorax. Cardiomediastinal silhouette and remainder the exam is unchanged.  IMPRESSION: No acute cardiopulmonary disease.  Right subclavian central venous catheter with tip overlying the region of the cavoatrial junction. No pneumothorax.   Electronically Signed   By: Marin Olp M.D.   On: 04/05/2014 20:41   Dg Abd Acute W/chest  04/05/2014   CLINICAL DATA:  Chronic constipation and diarrhea. Rule out obstruction.  EXAM: ACUTE ABDOMEN SERIES (ABDOMEN 2 VIEW & CHEST 1 VIEW)  COMPARISON:  CT today and chest x-ray 01/02/2006 with  abdominal films 12/01/2008  FINDINGS: Patient is rotated to the right. Lungs are otherwise adequately inflated without consolidation or effusion. Cardiomediastinal silhouette and remainder of the chest is unchanged. There is no free peritoneal air.  Abdominal images demonstrate evidence of patient's known severe dilatation of the rectosigmoid colon extending into the right abdomen with moderate fecal retention. No dilated small bowel loops. Degenerative changes of the spine and hips.  IMPRESSION: Evidence of patient's severely dilated rectosigmoid colon extending into the right abdomen with moderate fecal retention as seen on the CT scan from earlier today. No dilated small bowel loops. No free air.  No acute cardiopulmonary disease.   Electronically Signed   By: Marin Olp M.D.   On: 04/05/2014 18:23   Dg Abd Portable 1v  04/06/2014   CLINICAL DATA:  NG tube placement  EXAM: PORTABLE ABDOMEN - 1 VIEW  COMPARISON:  04/05/2014  FINDINGS: Enteric tube terminates in the gastric cardia.  IMPRESSION: Enteric tube terminates in the gastric cardia.   Electronically Signed   By: Julian Hy M.D.   On: 04/06/2014 01:32   Dg Abd Portable 1v  04/06/2014   CLINICAL DATA:  NG tube placement  EXAM: PORTABLE ABDOMEN - 1 VIEW  COMPARISON:  04/05/2014 at 1756 hr  FINDINGS: Enteric tube terminates in the gastric cardia with its sideport in the distal esophagus.  Moderate to large stool in the distal sigmoid/ rectum.  IMPRESSION: Enteric tube terminates in the gastric cardia with side port in the distal esophagus.  Moderate to large stool in the distal sigmoid/rectum, suggesting fecal impaction.   Electronically Signed   By: Julian Hy M.D.   On: 04/06/2014 00:21    ASSESMENT:   *  Acute, non-bloody but FOBT + diarrhea in pt with hx of colonic inertia/chronic constipation. Followed by Dr Olevia Perches x 20 years. On  Bowel regimen PTA of MOM, Miralax, metamucil, dulcolax and has BMs daily , occasionally skips a day.   C diff negative. WBCs improved. Fecal impaction on 9/6 CT scan causing bilateral of distal ureteral obstruction and severe bilateral hydronephrosis: so likely overflow diarrhea.  Treatement thus far with Miralax, mag citrate and enemas, NGT.  Fexiseal in place but this is counterproductive to evacuation of solid stool.    *  Schizophrenia, mental retardation.   *  AKI  *  SIRS.  ? Translocation of gut flora. On Vanc/Zosyn.   *  Hyperkalemia, resolved.   *  RML lung nodule.   *  Thrombocytopenia.      PLAN   *  Remove flexiseal.  Leave NGT in place and give another 4 litres of golytely ( 4 litres given twice so far)  *  May need to add Amitiza or Linzess.  *  Check KUB in AM.     [name] Branch  04/07/2014, 7:51 AM Pager: [phone]  GI ATTENDING  Interval history and history reviewed. Agree with H&P as above. Continue to purge gut. Needs long-term bowel regimen. We'll adjust. Agree with adding Linzess.  Docia Chuck. Geri Seminole., M.D. [facility] Division of Gastroenterology

## 2014-04-07 NOTE — Plan of Care (Signed)
Problem: Phase I Progression Outcomes Goal: OOB as tolerated unless otherwise ordered Outcome: Completed/Met Date Met:  04/07/14 One person assist to  [facility] without difficulty Goal: Voiding-avoid urinary catheter unless indicated Outcome: Not Applicable Date Met:  19/69/40 Pt has foley

## 2014-04-07 NOTE — Clinical Documentation Improvement (Signed)
   Please clarify in the progress notes and discharge summary if the diagnosis of SIRS has been ruled out or confirmed. "Believe WBC is actually artifact given his severe hemoconcentration/dehydration." documented by Dr. Greer Pickerel in surgical consult note 04/05/14  Please clarify the TYPE of Shock treated with IV fluids in the ED:  - hypovolemic  - other type of shock  - shock is not applicable to this admission  Thank You, Erling Conte ,RN Clinical Documentation Specialist:  [facility]

## 2014-04-08 ENCOUNTER — Inpatient Hospital Stay (HOSPITAL_COMMUNITY): Payer: Medicare Other

## 2014-04-08 DIAGNOSIS — N179 Acute kidney failure, unspecified: Secondary | ICD-10-CM | POA: Diagnosis present

## 2014-04-08 DIAGNOSIS — R6520 Severe sepsis without septic shock: Secondary | ICD-10-CM

## 2014-04-08 DIAGNOSIS — K592 Neurogenic bowel, not elsewhere classified: Secondary | ICD-10-CM

## 2014-04-08 DIAGNOSIS — A419 Sepsis, unspecified organism: Secondary | ICD-10-CM

## 2014-04-08 DIAGNOSIS — R6521 Severe sepsis with septic shock: Secondary | ICD-10-CM

## 2014-04-08 LAB — CBC WITH DIFFERENTIAL/PLATELET
Basophils Absolute: 0 10*3/uL (ref 0.0–0.1)
Basophils Relative: 0 % (ref 0–1)
Eosinophils Absolute: 0 10*3/uL (ref 0.0–0.7)
Eosinophils Relative: 0 % (ref 0–5)
HCT: 34.7 % — ABNORMAL LOW (ref 39.0–52.0)
HEMOGLOBIN: 11.8 g/dL — AB (ref 13.0–17.0)
LYMPHS PCT: 10 % — AB (ref 12–46)
Lymphs Abs: 0.8 10*3/uL (ref 0.7–4.0)
MCH: 29.9 pg (ref 26.0–34.0)
MCHC: 34 g/dL (ref 30.0–36.0)
MCV: 87.8 fL (ref 78.0–100.0)
MONOS PCT: 10 % (ref 3–12)
Monocytes Absolute: 0.8 10*3/uL (ref 0.1–1.0)
NEUTROS ABS: 6.7 10*3/uL (ref 1.7–7.7)
Neutrophils Relative %: 80 % — ABNORMAL HIGH (ref 43–77)
Platelets: 135 10*3/uL — ABNORMAL LOW (ref 150–400)
RBC: 3.95 MIL/uL — ABNORMAL LOW (ref 4.22–5.81)
RDW: 14.9 % (ref 11.5–15.5)
WBC: 8.3 10*3/uL (ref 4.0–10.5)

## 2014-04-08 LAB — COMPREHENSIVE METABOLIC PANEL
ALBUMIN: 2 g/dL — AB (ref 3.5–5.2)
ALK PHOS: 61 U/L (ref 39–117)
ALT: 31 U/L (ref 0–53)
ANION GAP: 8 (ref 5–15)
AST: 39 U/L — ABNORMAL HIGH (ref 0–37)
BUN: 18 mg/dL (ref 6–23)
CALCIUM: 6.9 mg/dL — AB (ref 8.4–10.5)
CO2: 25 mEq/L (ref 19–32)
Chloride: 113 mEq/L — ABNORMAL HIGH (ref 96–112)
Creatinine, Ser: 1.08 mg/dL (ref 0.50–1.35)
GFR calc Af Amer: >90 mL/min (ref >90)
GFR calc non Af Amer: 80 mL/min — ABNORMAL LOW (ref >90)
Glucose, Bld: 120 mg/dL — ABNORMAL HIGH (ref 70–99)
Potassium: 4.2 mEq/L (ref 3.7–5.3)
Sodium: 146 mEq/L (ref 137–147)
TOTAL PROTEIN: 4.7 g/dL — AB (ref 6.0–8.3)
Total Bilirubin: 0.5 mg/dL (ref 0.3–1.2)

## 2014-04-08 LAB — PROCALCITONIN: Procalcitonin: 14.1 ng/mL

## 2014-04-08 MED ORDER — LINACLOTIDE 145 MCG PO CAPS
145.0000 ug | ORAL_CAPSULE | Freq: Every day | ORAL | Status: DC
  Administered 2014-04-08 – 2014-04-10 (×3): 145 ug via ORAL
  Filled 2014-04-08 (×3): qty 1

## 2014-04-08 MED ORDER — DEXTROSE 5 % AND 0.9 % NACL IV BOLUS: 1000.0000 mL | Freq: Once | INTRAVENOUS | Status: DC

## 2014-04-08 MED ORDER — SODIUM CHLORIDE 0.9 % IV BOLUS (SEPSIS): 1000.0000 mL | INTRAVENOUS | Status: DC | PRN

## 2014-04-08 MED ORDER — POLYETHYLENE GLYCOL 3350 17 G PO PACK
34.0000 g | PACK | Freq: Two times a day (BID) | ORAL | Status: DC
  Administered 2014-04-08 – 2014-04-10 (×5): 34 g via ORAL
  Filled 2014-04-08 (×7): qty 2

## 2014-04-08 NOTE — Progress Notes (Addendum)
[facility] Progress Note   Dupree Givler DPO:[id number] DOB: [birth year] DOA: 04/05/2014 PCP: Gilles Chiquito, MD  Brief narrative: [name] Branch is a 48 y.o. male ad mitted by the ICU service for hypotension, diarrhea and weakness. Noted to have a large fecal impaction, leukocytosis, ARF and hydronephrosis due to compression from stool burden.    Subjective: No complaints- pulled NG tube out last night. No vomiting or nausea. No abdominal pain.   Assessment/Plan: Principal Problem:   Obstipation - cont management per GI - currently with good bowel sounds- regular diet started   Active Problems:      Septic shock - suspected to have translocation of bacteria from gut and started on Zosyn- GI has stopped Zosyn-  - blood cultures negative from yesterday and leukocytosis resolved    AKI (acute kidney injury) - resolved- can stop IVF  Thrombocytopenia - mild- follow  HTN - cont Metoprolol  UTI - positive UA on 9/6 but no culture done- will repeat UA  SCHIZOPHRENIA - stable    MENTAL RETARDATION    Code Status: Full code Family Communication: none- spoke with caretaker at bedside Disposition Plan: back to rest home DVT prophylaxis: heparin  Consultants: GI  Procedures: none  Antibiotics: Anti-infectives   Start     Dose/Rate Route Frequency Ordered Stop   04/05/14 1800  vancomycin (VANCOCIN) IVPB 1000 mg/200 mL premix  Status:  Discontinued     1,000 mg 200 mL/hr over 60 Minutes Intravenous Every 12 hours 04/05/14 1711 04/06/14 1053   04/05/14 1800  piperacillin-tazobactam (ZOSYN) IVPB 3.375 g  Status:  Discontinued     3.375 g 12.5 mL/hr over 240 Minutes Intravenous Every 8 hours 04/05/14 1711 04/08/14 1333         Objective: Filed Weights   04/06/14 0130 04/07/14 0045 04/07/14 0500  Weight: 83.1 kg (183 lb 3.2 oz) 83.2 kg (183 lb 6.8 oz) 83.2 kg (183 lb 6.8 oz)    Intake/Output Summary (Last 24 hours) at 04/08/14 1350 Last data filed at  04/08/14 0412  Gross per 24 hour  Intake     50 ml  Output      0 ml  Net     50 ml     Vitals Filed Vitals:   04/08/14 0000 04/08/14 0509 04/08/14 1056 04/08/14 1309  BP: 121/77 122/55 107/61 94/53  Pulse: 77 84 90 83  Temp: 97.9 F (36.6 C) 98.7 F (37.1 C) 98.6 F (37 C) 98.6 F (37 C)  TempSrc: Oral Oral Oral Oral  Resp: 16 16 18 18   Height:      Weight:      SpO2: 94% 100% 99% 98%    Exam: General: No acute respiratory distress Lungs: Clear to auscultation bilaterally without wheezes or crackles Cardiovascular: Regular rate and rhythm without murmur gallop or rub normal S1 and S2 Abdomen: Nontender, moderately distended, soft, bowel sounds positive, no rebound, no ascites, no appreciable mass Extremities: No significant cyanosis, clubbing, or edema bilateral lower extremities  Data Reviewed: Basic Metabolic Panel:  Recent Labs Lab 04/06/14 0115 04/06/14 0450 04/06/14 1125 04/07/14 0500 04/08/14 1035  NA 135* 136* 141 145 146  K 6.2* 6.0* 5.0 4.4 4.2  CL 111 109 113* 111 113*  CO2 16* 18* 17* 22 25  GLUCOSE 138* 177* 174* 146* 120*  BUN 43* 40* 37* 25* 18  CREATININE 1.63* 1.60* 1.48* 1.25 1.08  CALCIUM 6.6* 7.4* 7.1* 6.7* 6.9*  MG  --  4.2*  --   --   --  Liver Function Tests:  Recent Labs Lab 04/05/14 1520 04/06/14 1125 04/07/14 0500 04/08/14 1035  AST 65* 66* 47* 39*  ALT 27 33 30 31  ALKPHOS 99 66 58 61  BILITOT 1.2 0.8 0.7 0.5  PROT 7.3 5.1* 4.7* 4.7*  ALBUMIN 3.9 2.5* 2.1* 2.0*    Recent Labs Lab 04/05/14 1520  LIPASE 24   No results found for this basename: AMMONIA,  in the last 168 hours CBC:  Recent Labs Lab 04/05/14 1429 04/06/14 0450 04/07/14 0500 04/08/14 1035  WBC 15.8* 11.3* 7.8 8.3  NEUTROABS 13.1*  --   --  6.7  HGB 19.8* 15.9 13.7 11.8*  HCT 56.7* 46.4 38.6* 34.7*  MCV 89.3 88.0 85.6 87.8  PLT 161 146* 127* 135*   Cardiac Enzymes: No results found for this basename: CKTOTAL, CKMB, CKMBINDEX, TROPONINI,  in  the last 168 hours BNP (last 3 results) No results found for this basename: PROBNP,  in the last 8760 hours CBG:  Recent Labs Lab 04/05/14 2206 04/06/14 1128 04/06/14 1548 04/06/14 1924  GLUCAP 171* 155* 121* 174*    Recent Results (from the past 240 hour(s))  CLOSTRIDIUM DIFFICILE BY PCR     Status: None   Collection Time    04/05/14  5:51 PM      Result Value Ref Range Status   C difficile by pcr NEGATIVE  NEGATIVE Final  CULTURE, BLOOD (ROUTINE X 2)     Status: None   Collection Time    04/05/14  8:25 PM      Result Value Ref Range Status   Specimen Description BLOOD   Final   Special Requests     Final   Value: RT SUBCLAVIAN BOTTLES DRAWN AEROBIC AND ANAEROBIC [place] EA   Culture  Setup Time     Final   Value: 04/06/2014 00:26     Performed at [facility]   Culture     Final   Value:        BLOOD CULTURE RECEIVED NO GROWTH TO DATE CULTURE WILL BE HELD FOR 5 DAYS BEFORE ISSUING A FINAL NEGATIVE REPORT     Performed at [facility]   Report Status PENDING   Incomplete  MRSA PCR SCREENING     Status: Abnormal   Collection Time    04/05/14 10:20 PM      Result Value Ref Range Status   MRSA by PCR POSITIVE (*) NEGATIVE Final   Comment:            The GeneXpert MRSA Assay (FDA     approved for NASAL specimens     only), is one component of a     comprehensive MRSA colonization     surveillance program. It is not     intended to diagnose MRSA     infection nor to guide or     monitor treatment for     MRSA infections.     RESULT CALLED TO, READ BACK BY AND VERIFIED WITH:     SHEPHERD,B RN 0142 04/06/14 MITCHELL,L  CULTURE, BLOOD (ROUTINE X 2)     Status: None   Collection Time    04/05/14 10:51 PM      Result Value Ref Range Status   Specimen Description BLOOD RIGHT ANTECUBITAL   Final   Special Requests BOTTLES DRAWN AEROBIC ONLY 5CC   Final   Culture  Setup Time     Final   Value: 04/06/2014 08:03     Performed  at [facility]      Final   Value:        BLOOD CULTURE RECEIVED NO GROWTH TO DATE CULTURE WILL BE HELD FOR 5 DAYS BEFORE ISSUING A FINAL NEGATIVE REPORT     Performed at [facility]   Report Status PENDING   Incomplete     Studies:  Recent x-ray studies have been reviewed in detail by the Attending Physician  Scheduled Meds:  Scheduled Meds: . antiseptic oral rinse  7 mL Mouth Rinse q12n4p  . chlorhexidine  15 mL Mouth Rinse BID  . Chlorhexidine Gluconate Cloth  6 each Topical Q0600  . cloZAPine  125 mg Per Tube BID  . dextrose 5 % and 0.9% NaCl  1,000 mL Intravenous Once  . fluvoxaMINE  300 mg Per Tube QHS  . heparin  5,000 Units Subcutaneous 3 times per day  . Linaclotide  145 mcg Oral Daily  . metoprolol tartrate  50 mg Per Tube BID  . mupirocin ointment  1 application Nasal BID  . polyethylene glycol  34 g Oral BID  . Travoprost (BAK Free)  1 drop Both Eyes QHS   Continuous Infusions:   Time spent on care of this patient: 35 min   [place], MD 04/08/2014, 1:50 PM  LOS: 3 days   [facility] Office  [phone] Pager - Text Page per [email or web address]  If 7PM-7AM, please contact night-coverage [email or web address]

## 2014-04-08 NOTE — Progress Notes (Addendum)
ANTIBIOTIC CONSULT NOTE - Follow Up  Pharmacy Consult for Zosyn Indication: rule out sepsis  No Known Allergies  Patient Measurements: Height: 6' (182.9 cm) Weight: 183 lb 6.8 oz (83.2 kg) IBW/kg (Calculated) : 77.6    Vital Signs: Temp: 98.6 F (37 C) (09/09 1056) Temp src: Oral (09/09 1056) BP: 107/61 mmHg (09/09 1056) Pulse Rate: 90 (09/09 1056) Intake/Output from previous day: 09/08 0701 - 09/09 0700 In: 287.5 [I.V.:225; IV Piggyback:62.5] Out: 125 [Urine:125] Intake/Output from this shift:    Labs:  Recent Labs  04/06/14 0450 04/06/14 1125 04/07/14 0500 04/08/14 1035  WBC 11.3*  --  7.8 8.3  HGB 15.9  --  13.7 11.8*  PLT 146*  --  127* 135*  CREATININE 1.60* 1.48* 1.25 1.08   Estimated Creatinine Clearance: 92.8 ml/min (by C-G formula based on Cr of 1.08). No results found for this basename: VANCOTROUGH, Corlis Leak, VANCORANDOM, [place], GENTPEAK, GENTRANDOM, TOBRATROUGH, TOBRAPEAK, TOBRARND, AMIKACINPEAK, AMIKACINTROU, AMIKACIN,  in the last 72 hours   Microbiology: Recent Results (from the past 720 hour(s))  CLOSTRIDIUM DIFFICILE BY PCR     Status: None   Collection Time    04/05/14  5:51 PM      Result Value Ref Range Status   C difficile by pcr NEGATIVE  NEGATIVE Final  CULTURE, BLOOD (ROUTINE X 2)     Status: None   Collection Time    04/05/14  8:25 PM      Result Value Ref Range Status   Specimen Description BLOOD   Final   Special Requests     Final   Value: RT SUBCLAVIAN BOTTLES DRAWN AEROBIC AND ANAEROBIC [place] EA   Culture  Setup Time     Final   Value: 04/06/2014 00:26     Performed at [facility]   Culture     Final   Value:        BLOOD CULTURE RECEIVED NO GROWTH TO DATE CULTURE WILL BE HELD FOR 5 DAYS BEFORE ISSUING A FINAL NEGATIVE REPORT     Performed at [facility]   Report Status PENDING   Incomplete  MRSA PCR SCREENING     Status: Abnormal   Collection Time    04/05/14 10:20 PM      Result Value Ref Range  Status   MRSA by PCR POSITIVE (*) NEGATIVE Final   Comment:            The GeneXpert MRSA Assay (FDA     approved for NASAL specimens     only), is one component of a     comprehensive MRSA colonization     surveillance program. It is not     intended to diagnose MRSA     infection nor to guide or     monitor treatment for     MRSA infections.     RESULT CALLED TO, READ BACK BY AND VERIFIED WITH:     SHEPHERD,B RN 0142 04/06/14 MITCHELL,L  CULTURE, BLOOD (ROUTINE X 2)     Status: None   Collection Time    04/05/14 10:51 PM      Result Value Ref Range Status   Specimen Description BLOOD RIGHT ANTECUBITAL   Final   Special Requests BOTTLES DRAWN AEROBIC ONLY 5CC   Final   Culture  Setup Time     Final   Value: 04/06/2014 08:03     Performed at [facility]   Culture     Final   Value:  BLOOD CULTURE RECEIVED NO GROWTH TO DATE CULTURE WILL BE HELD FOR 5 DAYS BEFORE ISSUING A FINAL NEGATIVE REPORT     Performed at [facility]   Report Status PENDING   Incomplete   Assessment: [name] Branch with MR and chronic constipation presents with diarrhea and abdominal pain. FOBT+. Has received ~10L of GoLytely so far and KUB this morning still showing significant amount of stool. Patient's WBC has decreased to 8.3 and he is afebrile. No growth on cultures. Procalcitonin 26 yesterday, LA was 2.8 on 9/7- none drawn since. Today is D#3 of Zosyn  Goal of Therapy:  Eradication of Infection  Plan:  1. Zosyn 3.375g IV q8hr EI 2. Follow renal function, clinical progression and LOT.   Sury Wentworth D. Finbar Nippert, PharmD, BCPS Clinical Pharmacist Pager: [phone] 04/08/2014 12:30 PM

## 2014-04-08 NOTE — Progress Notes (Signed)
RN paged IVT and stated that pt. Pulled out his central line. Upon arrival to the room patient in bed and catheter was hanging on the IV pole connected to IV tubing. Sutures still intact on patient's chest. Sutures removed and vaseline gauze pressure dressing applied and secured. Informed RN to document what happened and to notify MD

## 2014-04-08 NOTE — Progress Notes (Signed)
Daily Rounding Note  04/08/2014, 11:40 AM  LOS: 3 days   SUBJECTIVE:       Liquid BMs continue.  No nausea, no belly pain Pulled out his NGT and his central line and second IV team member having problems placing peripheral IV  OBJECTIVE:         Vital signs in last 24 hours:    Temp:  [97.7 F (36.5 C)-98.7 F (37.1 C)] 98.6 F (37 C) (09/09 1056) Pulse Rate:  [77-97] 90 (09/09 1056) Resp:  [16-18] 18 (09/09 1056) BP: (107-122)/(55-77) 107/61 mmHg (09/09 1056) SpO2:  [94 %-100 %] 99 % (09/09 1056) Last BM Date: 04/07/14 General: looks the same, not ill. Alert and comfortable   Heart: rrr Chest: clear bil.  No labored breathing Abdomen: soft,  Distended,  Not tender Rectal exam with soft stool on glove, no solid stool.  Extremities: no CCE Neuro/Psych:  Cooperative, pleasant.   Intake/Output from previous day: 09/08 0701 - 09/09 0700 In: 287.5 [I.V.:225; IV Piggyback:62.5] Out: 125 [Urine:125]  Intake/Output this shift:    Lab Results:  Recent Labs  04/06/14 0450 04/07/14 0500 04/08/14 1035  WBC 11.3* 7.8 8.3  HGB 15.9 13.7 11.8*  HCT 46.4 38.6* 34.7*  PLT 146* 127* 135*   BMET  Recent Labs  04/06/14 1125 04/07/14 0500 04/08/14 1035  NA 141 145 146  K 5.0 4.4 4.2  CL 113* 111 113*  CO2 17* 22 25  GLUCOSE 174* 146* 120*  BUN 37* 25* 18  CREATININE 1.48* 1.25 1.08  CALCIUM 7.1* 6.7* 6.9*   LFT  Recent Labs  04/06/14 1125 04/07/14 0500 04/08/14 1035  PROT 5.1* 4.7* 4.7*  ALBUMIN 2.5* 2.1* 2.0*  AST 66* 47* 39*  ALT 33 30 31  ALKPHOS 66 58 61  BILITOT 0.8 0.7 0.5   PT/INR No results found for this basename: LABPROT, INR,  in the last 72 hours Hepatitis Panel No results found for this basename: HEPBSAG, HCVAB, HEPAIGM, HEPBIGM,  in the last 72 hours  Studies/Results: Dg Abd 1 View 04/08/2014   CLINICAL DATA:  Follow-up fecal impaction.  EXAM: ABDOMEN - 1 VIEW  COMPARISON:   04/06/2014  FINDINGS: Upper abdomen was incompletely imaged. Enteric tube seen on the prior study is not well evaluated on the current examination due to motion artifact. No definite intraperitoneal free air is identified on this supine study. Again seen is marked distension of the sigmoid colon. The sigmoid colon on today's examination contains more gas and a large but decreased amount of stool. There is increased, moderate gaseous distension of other, more proximal large bowel loops. No gross small bowel dilatation is seen. No acute osseous abnormality is seen.  IMPRESSION: Persistently dilated sigmoid colon containing a large but mildly decreased amount of stool. Increased colonic gas.   Electronically Signed   By: Logan Bores   On: 04/08/2014 08:26   Scheduled Meds: . antiseptic oral rinse  7 mL Mouth Rinse q12n4p  . chlorhexidine  15 mL Mouth Rinse BID  . Chlorhexidine Gluconate Cloth  6 each Topical Q0600  . cloZAPine  125 mg Per Tube BID  . fluvoxaMINE  300 mg Per Tube QHS  . heparin  5,000 Units Subcutaneous 3 times per day  . Linaclotide  145 mcg Oral Daily  . metoprolol tartrate  50 mg Per Tube BID  . mupirocin ointment  1 application Nasal BID  . piperacillin-tazobactam (ZOSYN)  IV  3.375  g Intravenous Q8H  . Travoprost (BAK Free)  1 drop Both Eyes QHS   Continuous Infusions: . dextrose 5 % and 0.45% NaCl 75 mL/hr at 04/07/14 1045   PRN Meds:.sodium chloride, metoprolol   ASSESMENT:   * Acute, non-bloody but FOBT + diarrhea in pt with hx of colonic inertia/chronic constipation. Followed by Dr Olevia Perches x 20 years. On Bowel regimen PTA of MOM, Miralax, metamucil, dulcolax and has BMs daily, occasionally skips a day.  C diff negative. WBCs improved. Fecal impaction on 9/6 CT scan causing bilateral of distal ureteral obstruction and severe bilateral hydronephrosis: so likely overflow diarrhea.  NGT and Fexiseal in place but this is counterproductive to evacuation of solid stool.    Amazing that despite golytely prep x 3 (12 litres total) since admission that KUB is still showing large volume of stool.  * Schizophrenia, mental retardation.  * AKI  * SIRS. ? Translocation of gut flora. On Zosyn.  * Hyperkalemia, resolved.  * RML lung nodule.  * Thrombocytopenia.     PLAN   *  Start Linzess today.  *   Stop Zosyn.  *   Regular diet.  Think we may be able to avoid placing IV line at present.  *  Restart miralax.     Azucena Freed  04/08/2014, 11:40 AM Pager: [phone]  GI ATTENDING  Interval history and data reviewed. Agree with above note. Overall improving (moving bowels, improved exam, improved x-ray, improved renal function and electrolytes). Resuming regular miralax and adding Linzess. At this point can be managed by primary service. Recommend you you monitor x-ray until stool in sigmoid colon has been cleared. Increase Miralax as needed. Could give fleets enemas at this point as well. We are available for questions / problems. Will sign off.  Docia Chuck. Geri Seminole., M.D. [facility] Division of Gastroenterology

## 2014-04-09 ENCOUNTER — Inpatient Hospital Stay (HOSPITAL_COMMUNITY): Payer: Medicare Other

## 2014-04-09 DIAGNOSIS — R197 Diarrhea, unspecified: Secondary | ICD-10-CM

## 2014-04-09 MED ORDER — FLEET ENEMA 7-19 GM/118ML RE ENEM
1.0000 | ENEMA | Freq: Three times a day (TID) | RECTAL | Status: DC
  Filled 2014-04-09 (×6): qty 1

## 2014-04-09 MED ORDER — METOPROLOL TARTRATE 50 MG PO TABS
50.0000 mg | ORAL_TABLET | Freq: Two times a day (BID) | ORAL | Status: DC
  Administered 2014-04-09: 50 mg via ORAL
  Filled 2014-04-09 (×4): qty 1

## 2014-04-09 NOTE — Progress Notes (Signed)
[facility] Progress Note   [name] Branch BBC:[id number] DOB: 09/24/1965 DOA: 04/05/2014 PCP: Gilles Chiquito, MD  Brief narrative: [name] Branch is a 48 y.o. male ad mitted by the ICU service for hypotension, diarrhea and weakness. Noted to have a large fecal impaction, leukocytosis, ARF and hydronephrosis due to compression from stool burden.    Subjective: Tolerating a solid diet without vomiting - still have large amount of loose stool.   Assessment/Plan: Principal Problem:   Obstipation - improving- cont diet- obtain another abdominal xray today   Active Problems:      Septic shock - suspected to have translocation of bacteria from gut and started on Zosyn- GI has stopped Zosyn-  - blood cultures negative from and leukocytosis resolved    AKI (acute kidney injury) - resolved- stopped IVF  Thrombocytopenia - mild- follow  HTN - cont Metoprolol  UTI - positive UA on 9/6 but no culture done- will repeat UA  SCHIZOPHRENIA - stable    MENTAL RETARDATION    Code Status: Full code Family Communication: none- spoke with caretaker at bedside Disposition Plan: back to rest home DVT prophylaxis: heparin  Consultants: GI  Procedures: none  Antibiotics: Anti-infectives   Start     Dose/Rate Route Frequency Ordered Stop   04/05/14 1800  vancomycin (VANCOCIN) IVPB 1000 mg/200 mL premix  Status:  Discontinued     1,000 mg 200 mL/hr over 60 Minutes Intravenous Every 12 hours 04/05/14 1711 04/06/14 1053   04/05/14 1800  piperacillin-tazobactam (ZOSYN) IVPB 3.375 g  Status:  Discontinued     3.375 g 12.5 mL/hr over 240 Minutes Intravenous Every 8 hours 04/05/14 1711 04/08/14 1333         Objective: Filed Weights   04/06/14 0130 04/07/14 0045 04/07/14 0500  Weight: 83.1 kg (183 lb 3.2 oz) 83.2 kg (183 lb 6.8 oz) 83.2 kg (183 lb 6.8 oz)   No intake or output data in the 24 hours ending 04/09/14 1029   Vitals Filed Vitals:   04/08/14 1056 04/08/14  1309 04/08/14 2100 04/09/14 0500  BP: 107/61 94/53 107/58 97/60  Pulse: 90 83 86 75  Temp: 98.6 F (37 C) 98.6 F (37 C) 98.4 F (36.9 C) 98.6 F (37 C)  TempSrc: Oral Oral    Resp: 18 18 18 20   Height:      Weight:      SpO2: 99% 98% 99% 98%    Exam: General: No acute respiratory distress Lungs: Clear to auscultation bilaterally without wheezes or crackles Cardiovascular: Regular rate and rhythm without murmur gallop or rub normal S1 and S2 Abdomen: Nontender, moderately distended, soft, bowel sounds positive, no rebound, no ascites, no appreciable mass Extremities: No significant cyanosis, clubbing, or edema bilateral lower extremities  Data Reviewed: Basic Metabolic Panel:  Recent Labs Lab 04/06/14 0115 04/06/14 0450 04/06/14 1125 04/07/14 0500 04/08/14 1035  NA 135* 136* 141 145 146  K 6.2* 6.0* 5.0 4.4 4.2  CL 111 109 113* 111 113*  CO2 16* 18* 17* 22 25  GLUCOSE 138* 177* 174* 146* 120*  BUN 43* 40* 37* 25* 18  CREATININE 1.63* 1.60* 1.48* 1.25 1.08  CALCIUM 6.6* 7.4* 7.1* 6.7* 6.9*  MG  --  4.2*  --   --   --    Liver Function Tests:  Recent Labs Lab 04/05/14 1520 04/06/14 1125 04/07/14 0500 04/08/14 1035  AST 65* 66* 47* 39*  ALT 27 33 30 31  ALKPHOS 99 66 58 61  BILITOT 1.2 0.8  0.7 0.5  PROT 7.3 5.1* 4.7* 4.7*  ALBUMIN 3.9 2.5* 2.1* 2.0*    Recent Labs Lab 04/05/14 1520  LIPASE 24   No results found for this basename: AMMONIA,  in the last 168 hours CBC:  Recent Labs Lab 04/05/14 1429 04/06/14 0450 04/07/14 0500 04/08/14 1035  WBC 15.8* 11.3* 7.8 8.3  NEUTROABS 13.1*  --   --  6.7  HGB 19.8* 15.9 13.7 11.8*  HCT 56.7* 46.4 38.6* 34.7*  MCV 89.3 88.0 85.6 87.8  PLT 161 146* 127* 135*   Cardiac Enzymes: No results found for this basename: CKTOTAL, CKMB, CKMBINDEX, TROPONINI,  in the last 168 hours BNP (last 3 results) No results found for this basename: PROBNP,  in the last 8760 hours CBG:  Recent Labs Lab 04/05/14 2206  04/06/14 1128 04/06/14 1548 04/06/14 1924  GLUCAP 171* 155* 121* 174*    Recent Results (from the past 240 hour(s))  CLOSTRIDIUM DIFFICILE BY PCR     Status: None   Collection Time    04/05/14  5:51 PM      Result Value Ref Range Status   C difficile by pcr NEGATIVE  NEGATIVE Final  CULTURE, BLOOD (ROUTINE X 2)     Status: None   Collection Time    04/05/14  8:25 PM      Result Value Ref Range Status   Specimen Description BLOOD   Final   Special Requests     Final   Value: RT SUBCLAVIAN BOTTLES DRAWN AEROBIC AND ANAEROBIC Ashley EA   Culture  Setup Time     Final   Value: 04/06/2014 00:26     Performed at [facility]   Culture     Final   Value:        BLOOD CULTURE RECEIVED NO GROWTH TO DATE CULTURE WILL BE HELD FOR 5 DAYS BEFORE ISSUING A FINAL NEGATIVE REPORT     Performed at [facility]   Report Status PENDING   Incomplete  MRSA PCR SCREENING     Status: Abnormal   Collection Time    04/05/14 10:20 PM      Result Value Ref Range Status   MRSA by PCR POSITIVE (*) NEGATIVE Final   Comment:            The GeneXpert MRSA Assay (FDA     approved for NASAL specimens     only), is one component of a     comprehensive MRSA colonization     surveillance program. It is not     intended to diagnose MRSA     infection nor to guide or     monitor treatment for     MRSA infections.     RESULT CALLED TO, READ BACK BY AND VERIFIED WITH:     SHEPHERD,B RN 0142 04/06/14 MITCHELL,L  CULTURE, BLOOD (ROUTINE X 2)     Status: None   Collection Time    04/05/14 10:51 PM      Result Value Ref Range Status   Specimen Description BLOOD RIGHT ANTECUBITAL   Final   Special Requests BOTTLES DRAWN AEROBIC ONLY 5CC   Final   Culture  Setup Time     Final   Value: 04/06/2014 08:03     Performed at [facility]   Culture     Final   Value:        BLOOD CULTURE RECEIVED NO GROWTH TO DATE CULTURE WILL BE HELD FOR 5 DAYS BEFORE  ISSUING A FINAL NEGATIVE REPORT      Performed at [facility]   Report Status PENDING   Incomplete     Studies:  Recent x-ray studies have been reviewed in detail by the Attending Physician  Scheduled Meds:  Scheduled Meds: . antiseptic oral rinse  7 mL Mouth Rinse q12n4p  . chlorhexidine  15 mL Mouth Rinse BID  . Chlorhexidine Gluconate Cloth  6 each Topical Q0600  . cloZAPine  125 mg Per Tube BID  . dextrose 5 % and 0.9% NaCl  1,000 mL Intravenous Once  . fluvoxaMINE  300 mg Per Tube QHS  . heparin  5,000 Units Subcutaneous 3 times per day  . Linaclotide  145 mcg Oral Daily  . metoprolol tartrate  50 mg Oral BID  . mupirocin ointment  1 application Nasal BID  . polyethylene glycol  34 g Oral BID  . Travoprost (BAK Free)  1 drop Both Eyes QHS   Continuous Infusions:   Time spent on care of this patient: 35 min   [place], MD 04/09/2014, 10:29 AM  LOS: 4 days   [facility] Office  [phone] Pager - Text Page per [email or web address]  If 7PM-7AM, please contact night-coverage [email or web address]

## 2014-04-09 NOTE — Care Management Note (Unsigned)
    Page 1 of 1   04/09/2014     11:35:46 AM CARE MANAGEMENT NOTE 04/09/2014  Patient:  Childrey,Menno   Account Number:  [id number]  Date Initiated:  04/06/2014  Documentation initiated by:  Luz Lex  Subjective/Objective Assessment:   Admitted with sepsis     Action/Plan:   Anticipated DC Date:  04/10/2014   Anticipated DC Plan:  [place]  CM consult      Choice offered to / List presented to:             Status of service:  In process, will continue to follow Medicare Important Message given?  YES (If response is "NO", the following Medicare IM given date fields will be blank) Date Medicare IM given:  04/09/2014 Medicare IM given by:  GRAVES-BIGELOW,Zacchaeus Halm Date Additional Medicare IM given:   Additional Medicare IM given by:    Discharge Disposition:    Per UR Regulation:  Reviewed for med. necessity/level of care/duration of stay  If discussed at [place] of Stay Meetings, dates discussed:    Comments:  04-06-14 7:44am Luz Lex, [place] [phone] Lives at home with Caregiver.  Mother is POA

## 2014-04-10 MED ORDER — LINACLOTIDE 145 MCG PO CAPS: 145.0000 ug | ORAL_CAPSULE | Freq: Every day | ORAL | Status: DC

## 2014-04-10 NOTE — Discharge Summary (Signed)
Physician Discharge Summary  [name] Branch ZOX:[id number] DOB: Sep 11, 1965 DOA: 04/05/2014  PCP: Gilles Chiquito, MD  Admit date: 04/05/2014 Discharge date: 04/10/2014  Time spent: >45 minutes  Recommendations for Outpatient Follow-up:  1. F/u on constipation issues in 1-2 wks to prevent recurrence of obstipation  Discharge Condition: stable Diet recommendation: low sodium  Discharge Diagnoses:  Principal Problem:   Obstipation Active Problems:   SCHIZOPHRENIA   MENTAL RETARDATION   HYPERTENSION   Septic shock   SIRS (systemic inflammatory response syndrome)   AKI (acute kidney injury)   History of present illness:  [name] Branch is a 48 y.o. male ad mitted by the ICU service for hypotension, acute renal failure, diarrhea and weakness. Noted to have a severely distended abdomen, large fecal impaction, leukocytosis, ARF as a result of hydronephrosis in relation to compression from stool burden.    Hospital Course:  Principal Problem:  Obstipation  - NG tube placed and laxative initiated on 9/7- GI was consulted as well and recommendations were to continue current treatment- he was given a total of 8 L of Golytely.  - stool was negative for C diff toxin - has has extensive amounts of BMs and abdomen is now soft - he has been tolerating a regular diet for > 24 hrs as well  - most recent abdominal xray from yesterday reveals in improvement is stool burden but still noted to have a significant amount of residual stool- however, as he is now quite stable, eating adequately without symptoms and having continuous BMs, he will be discharged home to continue a regimen of oral laxative - he has an appt to see his gastroenterologist on the 9/15  Active Problems:  Septic shock ?  - was hypotensive with leukocytosis on admission - suspected to have translocation of bacteria from gut and started on Zosyn -  Zosyn discontinued on 9/9 as blood cultures were negative and leukocytosis resolved    AKI (acute kidney injury)  - secondary to hydronephrosis from stool burden - resolved  Thrombocytopenia  - mild and unchanged from admission - follow as outpt  HTN  - cont Metoprolol   UTI  - positive UA on 9/6 but no culture done- will repeat UA   SCHIZOPHRENIA  - stable   MENTAL RETARDATION  - cared for in group home  Consultations:  GI consulted  Discharge Exam: Filed Weights   04/06/14 0130 04/07/14 0045 04/07/14 0500  Weight: 83.1 kg (183 lb 3.2 oz) 83.2 kg (183 lb 6.8 oz) 83.2 kg (183 lb 6.8 oz)   Filed Vitals:   04/10/14 0632  BP: 101/56  Pulse: 89  Temp: 98.4 F (36.9 C)  Resp:     General: AAO x 3, no distress Cardiovascular: RRR, no murmurs  Respiratory: clear to auscultation bilaterally GI: soft, non-tender, non-distended, bowel sound positive  Discharge Instructions You were cared for by a hospitalist during your hospital stay. If you have any questions about your discharge medications or the care you received while you were in the hospital after you are discharged, you can call the unit and asked to speak with the hospitalist on call if the hospitalist that took care of you is not available. Once you are discharged, your primary care physician will handle any further medical issues. Please note that NO REFILLS for any discharge medications will be authorized once you are discharged, as it is imperative that you return to your primary care physician (or establish a relationship with a primary care physician if you  do not have one) for your aftercare needs so that they can reassess your need for medications and monitor your lab values.     Medication List    ASK your doctor about these medications       bisacodyl 5 MG EC tablet  Commonly known as:  DULCOLAX  Take 2 capsules by mouth every morning     cloZAPine 25 MG tablet  Commonly known as:  CLOZARIL  Take 25 mg by mouth 2 (two) times daily.     cloZAPine 100 MG tablet  Commonly known as:   CLOZARIL  Take 100 mg by mouth 2 (two) times daily.     ENSURE COMPLETE SHAKE Liqd  Take 1 Can by mouth 2 (two) times daily.     fluvoxaMINE 100 MG tablet  Commonly known as:  LUVOX  Take 300 mg by mouth at bedtime.     magnesium citrate Soln  Take 1 Bottle by mouth 2 (two) times daily.     Magnesium Hydroxide 2400 MG/10ML Susp  Commonly known as:  MILK OF MAGNESIA CONCENTRATE  Take 45 cc (3 tablespoons) by mouth twice daily.     multivitamin capsule  Take 1 capsule by mouth daily.     polyethylene glycol powder powder  Commonly known as:  GLYCOLAX/MIRALAX  Dissolve 2 capful (34 grams) in at least 16 ounces water/juice and drink twice daily     pravastatin 40 MG tablet  Commonly known as:  PRAVACHOL  Take 1 tablet (40 mg total) by mouth daily.     psyllium 58.6 % powder  Commonly known as:  METAMUCIL  Take 1 tablespoon dissolved in at least 8 ounces water/juice and drink twice daily.     travoprost (benzalkonium) 0.004 % ophthalmic solution  Commonly known as:  TRAVATAN  Place 1 drop into both eyes at bedtime.       No Known Allergies    The results of significant diagnostics from this hospitalization (including imaging, microbiology, ancillary and laboratory) are listed below for reference.    Significant Diagnostic Studies: Ct Abdomen Pelvis Wo Contrast  04/05/2014   CLINICAL DATA:  Abdominal pain. Sepsis. Diarrhea. Splenomegaly. Sarcoidosis.  EXAM: CT ABDOMEN AND PELVIS WITHOUT CONTRAST  TECHNIQUE: Multidetector CT imaging of the abdomen and pelvis was performed following the standard protocol without IV contrast.  COMPARISON:  None.  FINDINGS: Kidneys/Urinary tract: No evidence of urolithiasis. Severe bilateral hydronephrosis and ureterectasis is seen due to compression by severely dilated rectosigmoid colon containing stool.  Liver: No mass or other abnormality visualized on this non-contrast exam.  Gallbladder/Biliary:  Unremarkable.  Pancreas: No mass or  inflammatory process visualized on this non-contrast exam.  Spleen:  Within normal limits in size.  Adrenal Glands:  No mass identified.  Lymph Nodes:  No pathologically enlarged lymph nodes identified.  Pelvic/Reproductive Organs: Bladder and prostate displaced bites freely dilated rectosigmoid colon containing stool.  Bowel/Peritoneum: Severe dilatation of rectosigmoid colon is seen containing large amount of stool. Proximal colon and small bowel remain nondilated.  Vascular:  No evidence of abdominal aortic aneurysm.  Musculoskeletal:  No suspicious bone lesions identified.  Other: Images through the lung bases show mild bibasilar scarring. A 6 mm noncalcified pulmonary nodules also seen in the inferior aspect of the right middle lobe on image 4, which is indeterminate.  IMPRESSION: Severely dilated rectosigmoid colon filled with stool, consistent with fecal impaction. This results in bilateral of distal ureteral obstruction and severe bilateral hydronephrosis.  6 mm indeterminate pulmonary nodule noted  in the inferior aspect of the right middle lobe. If the patient is at high risk for bronchogenic carcinoma, follow-up chest CT at 6-12 months is recommended. If the patient is at low risk for bronchogenic carcinoma, follow-up chest CT at 12 months is recommended. This recommendation follows the consensus statement: Guidelines for Management of Small Pulmonary Nodules Detected on CT Scans: A Statement from the [place] as published in Radiology 2005;237:395-400.   Electronically Signed   By: Earle Gell M.D.   On: 04/05/2014 18:03   Dg Abd 1 View  04/08/2014   CLINICAL DATA:  Follow-up fecal impaction.  EXAM: ABDOMEN - 1 VIEW  COMPARISON:  04/06/2014  FINDINGS: Upper abdomen was incompletely imaged. Enteric tube seen on the prior study is not well evaluated on the current examination due to motion artifact. No definite intraperitoneal free air is identified on this supine study. Again seen is marked  distension of the sigmoid colon. The sigmoid colon on today's examination contains more gas and a large but decreased amount of stool. There is increased, moderate gaseous distension of other, more proximal large bowel loops. No gross small bowel dilatation is seen. No acute osseous abnormality is seen.  IMPRESSION: Persistently dilated sigmoid colon containing a large but mildly decreased amount of stool. Increased colonic gas.   Electronically Signed   By: Logan Bores   On: 04/08/2014 08:26   US Renal  04/06/2014   CLINICAL DATA:  Bilateral hydronephrosis seen on CT.  Sepsis.  EXAM: RENAL/URINARY TRACT ULTRASOUND COMPLETE  COMPARISON:  None.  FINDINGS: Right Kidney:  Length: 9.9 cm. No renal mass visualized. Moderate to severe right hydronephrosis demonstrated as well as proximal ureterectasis.  Left Kidney:  Length: 11.8 cm. No renal mass visualized. Moderate to severe hydronephrosis and proximal ureterectasis demonstrated.  Bladder:  Foley catheter seen in place, and bladder is nearly completely empty.  IMPRESSION: Moderate to severe bilateral hydronephrosis. Etiology not apparent by ultrasound. See recent CT report. Abdomen pelvis   Electronically Signed   By: Earle Gell M.D.   On: 04/06/2014 09:56   Dg Chest Portable 1 View  04/05/2014   CLINICAL DATA:  Central line placement.  EXAM: PORTABLE CHEST - 1 VIEW  COMPARISON:  04/05/2014 at 17:56 p.m.  FINDINGS: There has been placement of a right subclavian central venous catheter with tip overlying the region of the cavoatrial junction. Patient is slightly rotated to the left. Lungs are adequately inflated without consolidation or effusion. There is no evidence of pneumothorax. Cardiomediastinal silhouette and remainder the exam is unchanged.  IMPRESSION: No acute cardiopulmonary disease.  Right subclavian central venous catheter with tip overlying the region of the cavoatrial junction. No pneumothorax.   Electronically Signed   By: Marin Olp M.D.   On:  04/05/2014 20:41   Dg Abd Acute W/chest  04/05/2014   CLINICAL DATA:  Chronic constipation and diarrhea. Rule out obstruction.  EXAM: ACUTE ABDOMEN SERIES (ABDOMEN 2 VIEW & CHEST 1 VIEW)  COMPARISON:  CT today and chest x-ray 01/02/2006 with abdominal films 12/01/2008  FINDINGS: Patient is rotated to the right. Lungs are otherwise adequately inflated without consolidation or effusion. Cardiomediastinal silhouette and remainder of the chest is unchanged. There is no free peritoneal air.  Abdominal images demonstrate evidence of patient's known severe dilatation of the rectosigmoid colon extending into the right abdomen with moderate fecal retention. No dilated small bowel loops. Degenerative changes of the spine and hips.  IMPRESSION: Evidence of patient's severely dilated rectosigmoid colon extending  into the right abdomen with moderate fecal retention as seen on the CT scan from earlier today. No dilated small bowel loops. No free air.  No acute cardiopulmonary disease.   Electronically Signed   By: Marin Olp M.D.   On: 04/05/2014 18:23   Dg Abd Portable 1v  04/09/2014   CLINICAL DATA:  Abdominal distention  EXAM: PORTABLE ABDOMEN - 1 VIEW  COMPARISON:  Prior abdominal radiograph 04/08/2014; CT abdomen/ pelvis 04/05/2014  FINDINGS: Improving gaseous distension of the colon. Significantly decreased stool burden in the rectosigmoid colon. No evidence of free air on the supine series. No acute osseous abnormality.  IMPRESSION: 1. Significantly reduced colonic stool burden. 2. Decreasing gaseous distension of the colon.   Electronically Signed   By: Jacqulynn Cadet M.D.   On: 04/09/2014 15:32   Dg Abd Portable 1v  04/06/2014   CLINICAL DATA:  NG tube placement  EXAM: PORTABLE ABDOMEN - 1 VIEW  COMPARISON:  04/05/2014  FINDINGS: Enteric tube terminates in the gastric cardia.  IMPRESSION: Enteric tube terminates in the gastric cardia.   Electronically Signed   By: Julian Hy M.D.   On: 04/06/2014  01:32   Dg Abd Portable 1v  04/06/2014   CLINICAL DATA:  NG tube placement  EXAM: PORTABLE ABDOMEN - 1 VIEW  COMPARISON:  04/05/2014 at 1756 hr  FINDINGS: Enteric tube terminates in the gastric cardia with its sideport in the distal esophagus.  Moderate to large stool in the distal sigmoid/ rectum.  IMPRESSION: Enteric tube terminates in the gastric cardia with side port in the distal esophagus.  Moderate to large stool in the distal sigmoid/rectum, suggesting fecal impaction.   Electronically Signed   By: Julian Hy M.D.   On: 04/06/2014 00:21    Microbiology: Recent Results (from the past 240 hour(s))  CLOSTRIDIUM DIFFICILE BY PCR     Status: None   Collection Time    04/05/14  5:51 PM      Result Value Ref Range Status   C difficile by pcr NEGATIVE  NEGATIVE Final  CULTURE, BLOOD (ROUTINE X 2)     Status: None   Collection Time    04/05/14  8:25 PM      Result Value Ref Range Status   Specimen Description BLOOD   Final   Special Requests     Final   Value: RT SUBCLAVIAN BOTTLES DRAWN AEROBIC AND ANAEROBIC [place] [facility]   Culture  Setup Time     Final   Value: 04/06/2014 00:26     Performed at [facility]   Culture     Final   Value:        BLOOD CULTURE RECEIVED NO GROWTH TO DATE CULTURE WILL BE HELD FOR 5 DAYS BEFORE ISSUING A FINAL NEGATIVE REPORT     Performed at [facility]   Report Status PENDING   Incomplete  MRSA PCR SCREENING     Status: Abnormal   Collection Time    04/05/14 10:20 PM      Result Value Ref Range Status   MRSA by PCR POSITIVE (*) NEGATIVE Final   Comment:            The GeneXpert MRSA Assay (FDA     approved for NASAL specimens     only), is one component of a     comprehensive MRSA colonization     surveillance program. It is not     intended to diagnose MRSA  infection nor to guide or     monitor treatment for     MRSA infections.     RESULT CALLED TO, READ BACK BY AND VERIFIED WITH:     SHEPHERD,B RN 0142 04/06/14 MITCHELL,L   CULTURE, BLOOD (ROUTINE X 2)     Status: None   Collection Time    04/05/14 10:51 PM      Result Value Ref Range Status   Specimen Description BLOOD RIGHT ANTECUBITAL   Final   Special Requests BOTTLES DRAWN AEROBIC ONLY 5CC   Final   Culture  Setup Time     Final   Value: 04/06/2014 08:03     Performed at [facility]   Culture     Final   Value:        BLOOD CULTURE RECEIVED NO GROWTH TO DATE CULTURE WILL BE HELD FOR 5 DAYS BEFORE ISSUING A FINAL NEGATIVE REPORT     Performed at [facility]   Report Status PENDING   Incomplete     Labs: Basic Metabolic Panel:  Recent Labs Lab 04/06/14 0115 04/06/14 0450 04/06/14 1125 04/07/14 0500 04/08/14 1035  NA 135* 136* 141 145 146  K 6.2* 6.0* 5.0 4.4 4.2  CL 111 109 113* 111 113*  CO2 16* 18* 17* 22 25  GLUCOSE 138* 177* 174* 146* 120*  BUN 43* 40* 37* 25* 18  CREATININE 1.63* 1.60* 1.48* 1.25 1.08  CALCIUM 6.6* 7.4* 7.1* 6.7* 6.9*  MG  --  4.2*  --   --   --    Liver Function Tests:  Recent Labs Lab 04/05/14 1520 04/06/14 1125 04/07/14 0500 04/08/14 1035  AST 65* 66* 47* 39*  ALT 27 33 30 31  ALKPHOS 99 66 58 61  BILITOT 1.2 0.8 0.7 0.5  PROT 7.3 5.1* 4.7* 4.7*  ALBUMIN 3.9 2.5* 2.1* 2.0*    Recent Labs Lab 04/05/14 1520  LIPASE 24   No results found for this basename: AMMONIA,  in the last 168 hours CBC:  Recent Labs Lab 04/05/14 1429 04/06/14 0450 04/07/14 0500 04/08/14 1035  WBC 15.8* 11.3* 7.8 8.3  NEUTROABS 13.1*  --   --  6.7  HGB 19.8* 15.9 13.7 11.8*  HCT 56.7* 46.4 38.6* 34.7*  MCV 89.3 88.0 85.6 87.8  PLT 161 146* 127* 135*   Cardiac Enzymes: No results found for this basename: CKTOTAL, CKMB, CKMBINDEX, TROPONINI,  in the last 168 hours BNP: BNP (last 3 results) No results found for this basename: PROBNP,  in the last 8760 hours CBG:  Recent Labs Lab 04/05/14 2206 04/06/14 1128 04/06/14 1548 04/06/14 1924  GLUCAP 171* 155* 121* 174*        SignedDebbe Odea, MD [facility] 04/10/2014, 9:20 AM

## 2014-04-12 LAB — CULTURE, BLOOD (ROUTINE X 2)
CULTURE: NO GROWTH
Culture: NO GROWTH

## 2014-04-13 ENCOUNTER — Telehealth: Payer: Self-pay | Admitting: Internal Medicine

## 2014-04-13 NOTE — Telephone Encounter (Signed)
Prior auth initiated via [email or web address]. This rx was provided in the hospital for patient and Dr Henrene Pastor (consulting GI) agreed with Linzess plan.

## 2014-04-14 ENCOUNTER — Ambulatory Visit (INDEPENDENT_AMBULATORY_CARE_PROVIDER_SITE_OTHER): Payer: Medicare Other | Admitting: Internal Medicine

## 2014-04-14 ENCOUNTER — Encounter: Payer: Self-pay | Admitting: Internal Medicine

## 2014-04-14 ENCOUNTER — Ambulatory Visit (INDEPENDENT_AMBULATORY_CARE_PROVIDER_SITE_OTHER)
Admission: RE | Admit: 2014-04-14 | Discharge: 2014-04-14 | Disposition: A | Discharge status: Home / Self Care | Payer: Medicare Other | Source: Ambulatory Visit | Attending: Internal Medicine | Admitting: Internal Medicine

## 2014-04-14 ENCOUNTER — Telehealth: Payer: Self-pay | Admitting: Internal Medicine

## 2014-04-14 VITALS — BP 90/54 | HR 72 | Ht 74.0 in | Wt 171.0 lb

## 2014-04-14 DIAGNOSIS — K5900 Constipation, unspecified: Secondary | ICD-10-CM

## 2014-04-14 DIAGNOSIS — K598 Other specified functional intestinal disorders: Secondary | ICD-10-CM

## 2014-04-14 DIAGNOSIS — K5989 Other specified functional intestinal disorders: Secondary | ICD-10-CM

## 2014-04-14 MED ORDER — PSYLLIUM 58.6 % PO POWD: ORAL | Status: DC

## 2014-04-14 MED ORDER — MAGNESIUM HYDROXIDE 2400 MG/10ML PO SUSP: ORAL | Status: DC

## 2014-04-14 MED ORDER — ENSURE COMPLETE SHAKE PO LIQD: 1.0000 | Freq: Two times a day (BID) | ORAL | Status: DC

## 2014-04-14 MED ORDER — BISACODYL 5 MG PO TBEC: DELAYED_RELEASE_TABLET | ORAL | Status: DC

## 2014-04-14 MED ORDER — POLYETHYLENE GLYCOL 3350 17 GM/SCOOP PO POWD: ORAL | Status: DC

## 2014-04-14 MED ORDER — LINACLOTIDE 145 MCG PO CAPS: 145.0000 ug | ORAL_CAPSULE | Freq: Every day | ORAL | Status: DC

## 2014-04-14 MED ORDER — MAGNESIUM CITRATE PO SOLN: 1.0000 | ORAL | Status: DC

## 2014-04-14 NOTE — Progress Notes (Signed)
[name] Branch 1966/03/19 [id number]  Note: This dictation was prepared with Dragon digital system. Any transcriptional errors that result from this procedure are unintentional.   History of Present Illness:  This is a 48 year old African American male with colonic inertia, cathartic colon, severe obstipation. He had a recent hospitalizationfor severe overflow diarrhea. He developed a large liquid stools secondary to a vigorous laxative regimen and he evacuated a large amount of stool which was Hemoccult-negative. He denies abdominal pain, nausea or vomiting. His last colonoscopy in June 2007 was attempted but not completed because of redundant and dilated atonic colon with suboptimal prep despite several day of laxatives. He is currently doing very well on his usual regimen of Metamucil 1 tablespoon twice a day, MiraLax 2 capfuls twice a day, magnesium citrate one bottle twice a week, milk of magnesia 45 cc twice a day, and Dulcolax 2 tablespoons daily. He was also started on Linzess 145 mcg daily but has not started it because it has not been approved by Medicaid. If Medicaid does not approve it, we will try Amitiza 24 ug instead. Today, he weighs 170 pounds,down from 190 pounds. His caretaker, Mrs. Arby Barrette reports that he has good appetite.    Past Medical History  Diagnosis Date  . Colonic inertia   . Hyperlipidemia   . Abscess of anal and rectal regions   . Constipation   . Sarcoidosis   . 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 Schizophrenia   . Mental retardation   . Cardiomyopathy   . Glaucoma     Past Surgical History  Procedure Laterality Date  . Groin surgery      No Known Allergies  Family history and social history have been reviewed.  Review of Systems: Continue to opt stooling of soft bowel movements  The remainder of the 10 point ROS is negative except as outlined in the H&P  Physical Exam: General Appearance thin, in no distress Eyes  Non icteric  HEENT  Non  traumatic, normocephalic  Mouth No lesion, tongue papillated, no cheilosis Neck Supple without adenopathy, thyroid not enlarged, no carotid bruits, no JVD Lungs Clear to auscultation bilaterally COR Normal S1, normal S2, regular rhythm, no murmur, quiet precordium Abdomen soft. No palpable mass except in the left lower quadrant there is small amount of palpable stool in the colon. There is no tenderness. Bowel sounds are soft. There is no fluid wave. No tympany Rectal  rectal exam was deferred because of extreme soreness and tenderness due to frequent bowel movements Extremities  No pedal edema Skin No lesions Neurological Alert and oriented x 3 Psychological Normal mood and affect, he is mentally retarded but responds to questions  Assessment and Plan:   Problem #5 48 year old, African American male with atonic colon who had an episode of severe diarrhea from overflow and evacuated a large amount of impacted stool while in the hospital. He is doing well back on his usual regimen. He will continue the same including Linzess 145 mcg daily. We will obtain a KUB today to assess for impaction. We will see him in 3 months.    [name] 04/14/2014

## 2014-04-14 NOTE — Telephone Encounter (Signed)
Done

## 2014-04-14 NOTE — Patient Instructions (Addendum)
We have given prescriptions for Metamucil, Miralax, Mag Citrate, Milk of Magnesia, Dulcolax, Linzess, Ensure  Please go to the basement floor to xray before leaving today.  Please follow up with Dr Olevia Perches in 3 months.  CC:Dr Gilles Chiquito

## 2014-04-15 ENCOUNTER — Telehealth: Payer: Self-pay | Admitting: *Deleted

## 2014-04-15 NOTE — Telephone Encounter (Signed)
Left message for patient's caregiver to call back.

## 2014-04-15 NOTE — Telephone Encounter (Signed)
Amitiza 24 ug, #180, 1 po bid,3 refills

## 2014-04-15 NOTE — Telephone Encounter (Signed)
[employer] has denied patient's Linzess prescription ([id number]). They want him to try Amitiza first. Dr Olevia Perches, what dosage of Amitiza do you want patient to try?

## 2014-04-16 MED ORDER — LUBIPROSTONE 24 MCG PO CAPS: 24.0000 ug | ORAL_CAPSULE | Freq: Two times a day (BID) | ORAL | Status: DC

## 2014-04-16 NOTE — Telephone Encounter (Signed)
I have spoken to patient's caregiver, Joaquim Lai and have advised that we must first try Amitiza per insurance request. She verbalizes understanding and would like for me to send Amitiza script to her mailing address. Rx sent.

## 2014-07-21 ENCOUNTER — Telehealth: Payer: Self-pay | Admitting: Internal Medicine

## 2014-07-21 NOTE — Telephone Encounter (Signed)
Patient actually needs a follow up office visit with Dr Olevia Perches. I advised of this and was told that Dub Mikes is patient's caregiver so she would have to decide if he could come for the 07/29/14 office visit I offered. Dub Mikes is to call back.

## 2014-07-21 NOTE — Telephone Encounter (Signed)
Patient has been scheduled for 07/29/14 follow up with Dr Olevia Perches (per her last office note, needed 3 month follow up).

## 2014-07-27 ENCOUNTER — Other Ambulatory Visit: Payer: Self-pay | Admitting: *Deleted

## 2014-07-27 DIAGNOSIS — E785 Hyperlipidemia, unspecified: Secondary | ICD-10-CM

## 2014-07-27 MED ORDER — PRAVASTATIN SODIUM 40 MG PO TABS: 40.0000 mg | ORAL_TABLET | Freq: Every day | ORAL | Status: DC

## 2014-07-27 NOTE — Telephone Encounter (Signed)
Rx faxed in.

## 2014-07-29 ENCOUNTER — Other Ambulatory Visit (INDEPENDENT_AMBULATORY_CARE_PROVIDER_SITE_OTHER): Payer: Medicare Other

## 2014-07-29 ENCOUNTER — Encounter: Payer: Self-pay | Admitting: Internal Medicine

## 2014-07-29 ENCOUNTER — Ambulatory Visit (INDEPENDENT_AMBULATORY_CARE_PROVIDER_SITE_OTHER): Payer: Medicare Other | Admitting: Internal Medicine

## 2014-07-29 VITALS — BP 104/66 | HR 88 | Ht 74.0 in | Wt 187.1 lb

## 2014-07-29 DIAGNOSIS — K5939 Other megacolon: Secondary | ICD-10-CM

## 2014-07-29 DIAGNOSIS — R911 Solitary pulmonary nodule: Secondary | ICD-10-CM

## 2014-07-29 DIAGNOSIS — K593 Megacolon, not elsewhere classified: Secondary | ICD-10-CM

## 2014-07-29 DIAGNOSIS — K5900 Constipation, unspecified: Secondary | ICD-10-CM

## 2014-07-29 DIAGNOSIS — N1339 Other hydronephrosis: Secondary | ICD-10-CM

## 2014-07-29 LAB — CBC WITH DIFFERENTIAL/PLATELET
BASOS ABS: 0 10*3/uL (ref 0.0–0.1)
BASOS PCT: 0.4 % (ref 0.0–3.0)
EOS ABS: 0 10*3/uL (ref 0.0–0.7)
Eosinophils Relative: 0.1 % (ref 0.0–5.0)
HEMATOCRIT: 40.7 % (ref 39.0–52.0)
Hemoglobin: 13.2 g/dL (ref 13.0–17.0)
Lymphocytes Relative: 16 % (ref 12.0–46.0)
Lymphs Abs: 0.9 10*3/uL (ref 0.7–4.0)
MCHC: 32.4 g/dL (ref 30.0–36.0)
MCV: 91.7 fl (ref 78.0–100.0)
MONO ABS: 0.4 10*3/uL (ref 0.1–1.0)
Monocytes Relative: 7.9 % (ref 3.0–12.0)
NEUTROS PCT: 75.6 % (ref 43.0–77.0)
Neutro Abs: 4.1 10*3/uL (ref 1.4–7.7)
Platelets: 190 10*3/uL (ref 150.0–400.0)
RBC: 4.44 Mil/uL (ref 4.22–5.81)
RDW: 14.2 % (ref 11.5–15.5)
WBC: 5.4 10*3/uL (ref 4.0–10.5)

## 2014-07-29 LAB — COMPREHENSIVE METABOLIC PANEL
ALBUMIN: 4.3 g/dL (ref 3.5–5.2)
ALT: 28 U/L (ref 0–53)
AST: 25 U/L (ref 0–37)
Alkaline Phosphatase: 122 U/L — ABNORMAL HIGH (ref 39–117)
BUN: 11 mg/dL (ref 6–23)
CALCIUM: 9.5 mg/dL (ref 8.4–10.5)
CO2: 26 mEq/L (ref 19–32)
CREATININE: 1 mg/dL (ref 0.4–1.5)
Chloride: 108 mEq/L (ref 96–112)
GFR: 103.79 mL/min (ref >60.00)
Glucose, Bld: 85 mg/dL (ref 70–99)
POTASSIUM: 4.3 meq/L (ref 3.5–5.1)
Sodium: 140 mEq/L (ref 135–145)
Total Bilirubin: 0.6 mg/dL (ref 0.2–1.2)
Total Protein: 7.7 g/dL (ref 6.0–8.3)

## 2014-07-29 MED ORDER — LUBIPROSTONE 24 MCG PO CAPS: 24.0000 ug | ORAL_CAPSULE | Freq: Two times a day (BID) | ORAL | Status: DC

## 2014-07-29 MED ORDER — BISACODYL 5 MG PO TBEC: DELAYED_RELEASE_TABLET | ORAL | Status: DC

## 2014-07-29 MED ORDER — POLYETHYLENE GLYCOL 3350 17 GM/SCOOP PO POWD: ORAL | Status: DC

## 2014-07-29 MED ORDER — ENSURE COMPLETE SHAKE PO LIQD: 1.0000 | Freq: Two times a day (BID) | ORAL | Status: DC

## 2014-07-29 MED ORDER — MAGNESIUM HYDROXIDE 2400 MG/10ML PO SUSP: ORAL | Status: DC

## 2014-07-29 MED ORDER — MAGNESIUM CITRATE PO SOLN: 1.0000 | Freq: Every day | ORAL | Status: DC

## 2014-07-29 MED ORDER — PSYLLIUM 58.6 % PO POWD: ORAL | Status: DC

## 2014-07-29 NOTE — Patient Instructions (Addendum)
Your physician has requested that you go to the basement for the following lab work before leaving today: CMET, CBC  You have been scheduled for a CT scan of the chest, abdomen and pelvis at [facility] ([address]---this is in the same building as Press [facility]).   You are scheduled on Monday 08/03/14 at 9:00 am. You should arrive 15 minutes prior to your appointment time for registration. Please follow the written instructions below on the day of your exam:  WARNING: IF YOU ARE ALLERGIC TO IODINE/X-RAY DYE, PLEASE NOTIFY RADIOLOGY IMMEDIATELY AT [phone]! YOU WILL BE GIVEN A 13 HOUR PREMEDICATION PREP.  1) Do not eat or drink anything after 5:00 am (4 hours prior to your test) 2) You have been given 2 bottles of oral contrast to drink. The solution may taste better if refrigerated, but do NOT add ice or any other liquid to this solution. Shake well before drinking.    Drink 1 bottle of contrast @ 7:00 am (2 hours prior to your exam)  Drink 1 bottle of contrast @ 8:00 am (1 hour prior to your exam)  You may take any medications as prescribed with a small amount of water except for the following: Metformin, Glucophage, Glucovance, Avandamet, Riomet, Fortamet, Actoplus Met, Janumet, Glumetza or Metaglip. The above medications must be held the day of the exam AND 48 hours after the exam.  The purpose of you drinking the oral contrast is to aid in the visualization of your intestinal tract. The contrast solution may cause some diarrhea. Before your exam is started, you will be given a small amount of fluid to drink. Depending on your individual set of symptoms, you may also receive an intravenous injection of x-ray contrast/dye. Plan on being at [facility] for 30 minutes or long, depending on the type of exam you are having performed.  This test typically takes 30-45 minutes to complete.  If you have any questions regarding your exam or if you need to reschedule,  you may call the CT department at [phone] between the hours of 8:00 am and 5:00 pm, Monday-Friday.  ________________________________________________________________________   [name] Branch have been scheduled for an appointment with Dr Johney Maine at [facility]. Your appointment is on 08/17/14 at 8:45 am. Please arrive at 8:15 am for registration. Make certain to bring a list of current medications, including any over the counter medications or vitamins. Also bring your co-pay if you have one as well as your insurance cards. [facility] is located at [address], [address]. Should you need to reschedule your appointment, please contact them at [phone].  We have given you prescriptions for the medications to take your pharmacy.  CC: Gilles Chiquito, Dr Neysa Bonito

## 2014-07-29 NOTE — Progress Notes (Signed)
[name] Branch [birth year] [id number]  Note: This dictation was prepared with Dragon digital system. Any transcriptional errors that result from this procedure are unintentional.   History of Present Illness:  This is a 48 year old African-American male with megacolon, severe constipation, impaction and colonic inertia. I have known him for 10 years and his condition has worsened. He has mental retardation and is cared for by a caretaker in a group home. He has had several hospitalizations for fecal impaction and overflow diarrhea. His last one was in September 2015. A CT scan of the abdomen at that time showed a dilated colon filled with a large amount of stool, and bilateral hydronephrosis due to ureteral compression by large colon. He also had a 6 mm pulmonary nodule which needs a follow-up in 3-6 months. He has been on an extensive  laxative regimen which has been gradually increased over the past several years to include magnesium citrate 3 times a week, milk of magnesia 45 cc twice a day, MiraLAX 34 g twice a day, metamucil 1 tablespoon twice a day and Dulcolax tablets 2 capsules every morning. He has not had a bowel movement for 4 days and his caretaker reports him getting increasingly more distended. There has been no nausea or vomiting. His weight has increased from 172 to 187 pounds since his last appointment. His appetite has been good. He denies abdominal pain. There is no rectal bleeding. The last attempt for colonoscopy in 2007 was unsuccessful due to a large torturous colon which was poorly prepped. We have not been able to clean him out sufficiently to repeat the colonoscopy. His abdominal exam always shows a large fecal mass in the upper abdomen    Past Medical History  Diagnosis Date  . Colonic inertia   . Hyperlipidemia   . Abscess of anal and rectal regions   . Constipation   . Sarcoidosis   . 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 Schizophrenia   . Mental retardation   .  Cardiomyopathy   . Glaucoma     Past Surgical History  Procedure Laterality Date  . Groin surgery      No Known Allergies  Family history and social history have been reviewed.  Review of Systems: Abdominal distention. Constipation. Negative for rectal bleeding  The remainder of the 10 point ROS is negative except as outlined in the H&P  Physical Exam: General Appearance thin, in no distress, answers to simple questions very cooperative Eyes  Non icteric  HEENT  Non traumatic, normocephalic  Mouth No lesion, tongue papillated, no cheilosis Neck Supple without adenopathy, thyroid not enlarged, no carotid bruits, no JVD Lungs Clear to auscultation bilaterally COR Normal S1, normal S2, regular rhythm, no murmur, quiet precordium Abdomen protuberant, firm abdomen which is nontender. Decreased bowel sounds. No ascites, large amount of stool  throughout. Rectal large amount of impacted Hemoccult-negative stool Extremities  No pedal edema Skin No lesions Neurological Alert and oriented x 3 Psychological Normal mood and affect, mental retardation, very cooperative  Assessment and Plan:   Problem #62 48 year old African-American male who has severe colonic impaction and giant megacolon which has not been able to evacuate now for several years and he is no longer responding to a rigorous bowel regimen. CT scan shows  evidence of hydronephrosis due to ureteral compression by large overdistended olon. I feel we are getting to the point were he will need a total colectomy in order to prevent him from further compression of vital structures. I'm  specifically concerned about the possibility of renal failure due to obstructive uropathy. We will check his CMET as well as CBC today and I have increased his regimen to mag citrate daily and increased MiraLAX to 2.5 capfuls twice a day. We will make a surgical referral today for consideration of a total colectomy. I am skeptical that he will be able to  clean his colon before surgery and that the surgery would have to be carried out with a full load of stool in his colon. He is a sweet man and his caretaker is doing an excellent job in taking care of him. She is very interested in pursuing whatever needs to be done to help further deterioration of his condition. Patient himself is unable to comprehend the situation and would not be able to give a consent.    [name] 07/29/2014

## 2014-07-30 NOTE — Progress Notes (Signed)
thanx for your  prompt response

## 2014-08-03 ENCOUNTER — Ambulatory Visit (INDEPENDENT_AMBULATORY_CARE_PROVIDER_SITE_OTHER)
Admission: RE | Admit: 2014-08-03 | Discharge: 2014-08-03 | Disposition: A | Discharge status: Home / Self Care | Payer: Medicare Other | Source: Ambulatory Visit | Attending: Internal Medicine | Admitting: Internal Medicine

## 2014-08-03 DIAGNOSIS — R911 Solitary pulmonary nodule: Secondary | ICD-10-CM

## 2014-08-03 DIAGNOSIS — K5939 Other megacolon: Secondary | ICD-10-CM

## 2014-08-03 DIAGNOSIS — K593 Megacolon, not elsewhere classified: Secondary | ICD-10-CM

## 2014-08-03 MED ORDER — IOHEXOL 300 MG/ML  SOLN
100.0000 mL | Freq: Once | INTRAMUSCULAR | Status: AC | PRN
  Administered 2014-08-03: 100 mL via INTRAVENOUS

## 2014-08-11 ENCOUNTER — Telehealth: Payer: Self-pay | Admitting: Internal Medicine

## 2014-08-11 NOTE — Telephone Encounter (Signed)
Form completed and faxed to Ms. Truman Hayward. Ms. Truman Hayward aware.

## 2014-08-17 ENCOUNTER — Other Ambulatory Visit (INDEPENDENT_AMBULATORY_CARE_PROVIDER_SITE_OTHER): Payer: Self-pay | Admitting: Surgery

## 2014-08-17 NOTE — H&P (Signed)
[name] Branch 08/17/2014 8:22 AM Location: [facility] Patient #: [id number] DOB: Jul 08, 1966 Single / Language: [name] Branch / Race: Black or African American Male History of Present Illness [name] Hector MD; 08/17/2014 9:26 AM) Patient words: colectomy.  The patient is a 49 year old male who presents with a complaint of Constipation. Patient for surgical evaluation by Dr. Olevia Perches for concerns of severe constipation/colonic inertia Pleasant thin middle-aged male with no prior abdominal surgeries. History of some developmental delay and schizophrenia. He is here with his caretaker that has helped take care of him for 20 years. The patient has struggled with worsening constipation for most of his life. He has been on numerous laxatives, fiber bowel regimens and antispasmodics. Followed by gastroenterology. 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 He is required disimpactions and admissions for this. Never able to get a good bowel prep for some time. Came into the hospital in September 2015 with worsening abdominal pain. Past few CAT scan showed a giant colon full of stool progressively worsening. He is on MiraLAX, Metamucil, Dulcolax, milk of magnesia. He has been on Amitiza in the past. He's been on Lizness recently. Enemas less effectiveHe is drinking one bottle of magnesium citrate a day. He has 1 small bowel movement every day or so. Rarely has any large bulky well-formed stools. His weight has been stable. He is getting bilateral hydronephrosis. The large colon is getting bilateral ureteral compressions now. He was sent for surgical consultation to see if colectomy is warranted. No history of colon polyps. No history of Crohn's or inflammatory bowel disease. No history of irritable bowel syndrome. Can walk a couple miles without difficulty. No history of exertional chest pain or shortness of breath. No history of abdominal surgeries. Had a  small abscess lanced in his groin years ago. No history of hernia surgery. His caretaker does smoke at home. 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 Luvox CR (100MG  Capsule ER 24HR, Oral) Active. Lubiprostone (24MCG Capsule, Oral) Active. Magnesium Citrate (1.745GM/30ML Solution, Oral) Active. Multivitamins (Oral) Active. Ensure (Oral) Active. Pravastatin Sodium (40MG  Tablet, Oral) Active. Psyllium (58.6% Packet, Oral) Active. Travatan Z (0.004% Solution, Ophthalmic) Active. Milk of Magnesia (Oral) Active.  Social History [name] Branch, CMA; 08/17/2014 8:23 AM) Caffeine use Carbonated beverages, Coffee, Tea. No alcohol use No drug use Tobacco use Never smoker.  Family History [name] Branch, [place]; 08/17/2014 8:23 AM) Family history unknown First Degree Relatives     Review of Systems [name] Branch CMA; 08/17/2014 8:23 AM) General Not Present- Appetite Loss, Chills, Fatigue, Fever, Night Sweats, Weight Gain and Weight Loss. Skin Not Present- Change in Wart/Mole, Dryness, Hives, Jaundice, New Lesions, Non-Healing Wounds, Rash and Ulcer. HEENT Not Present- Earache, Hearing Loss, Hoarseness, Nose Bleed, Oral Ulcers, Ringing in the Ears, Seasonal Allergies, Sinus Pain, Sore Throat, Visual Disturbances, Wears glasses/contact  lenses and Yellow Eyes. Breast Not Present- Breast Mass, Breast Pain, Nipple Discharge and Skin Changes. Cardiovascular Not Present-  Chest Pain, Difficulty Breathing Lying Down, Leg Cramps, Palpitations, Rapid Heart Rate, Shortness of Breath and Swelling of Extremities. Gastrointestinal Present- Constipation. Not Present- Abdominal Pain, Bloating, Bloody Stool, Change in Bowel Habits, Chronic diarrhea, Difficulty Swallowing, Excessive gas, Gets full quickly at meals, Hemorrhoids, Indigestion, Nausea, Rectal Pain and Vomiting. Male Genitourinary Not Present- Blood in Urine, Change in Urinary Stream, Frequency, Impotence, Nocturia, Painful Urination, Urgency and Urine Leakage. Neurological Not Present- Decreased Memory, Fainting, Headaches, Numbness, Seizures, Tingling, Tremor, Trouble walking and Weakness. Psychiatric Not Present- Anxiety, Bipolar, Change in Sleep Pattern, Depression, Fearful and Frequent crying. Endocrine Not Present- Cold Intolerance, Excessive Hunger, Hair Changes, Heat Intolerance, Hot flashes and New Diabetes. Hematology Not Present- Easy Bruising, Excessive bleeding, Gland problems, HIV and Persistent Infections.  Vitals ([name] Branch CMA; 08/17/2014 8:23 AM) 08/17/2014 8:23 AM Weight: 175 lb Height: 73in Body Surface Area: 2.02 m Body Mass Index: 23.09 kg/m Temp.: 98.81F(Temporal)  Pulse: 110 (Regular)  BP: 130/74 (Sitting, Left Arm, Standard)     Physical Exam [name] Hector MD; 08/17/2014 9:11 AM)  General Mental Status-Alert. General Appearance-Not in acute distress, Not Sickly. Orientation-Oriented X3. Hydration-Well hydrated. Voice-Normal.  Integumentary Global Assessment Upon inspection and palpation of skin surfaces of the - Axillae: non-tender, no inflammation or ulceration, no drainage. and Distribution of scalp and body hair is normal. General Characteristics Temperature - normal warmth is noted.  Head and  Neck Head-normocephalic, atraumatic with no lesions or palpable masses. Face Global Assessment - atraumatic, no absence of expression. Neck Global Assessment - no abnormal movements, no bruit auscultated on the right, no bruit auscultated on the left, no decreased range of motion, non-tender. Trachea-midline. Thyroid Gland Characteristics - non-tender.  Eye Eyeball - Left-Extraocular movements intact, No Nystagmus. Eyeball - Right-Extraocular movements intact, No Nystagmus. Cornea - Left-No Hazy. Cornea - Right-No Hazy. Sclera/Conjunctiva - Left-No scleral icterus, No Discharge. Sclera/Conjunctiva - Right-No scleral icterus, No Discharge. Pupil - Left-Direct reaction to light normal. Pupil - Right-Direct reaction to light normal.  ENMT Ears Pinna - Left - no drainage observed, no generalized tenderness observed. Right - no drainage observed, no generalized tenderness observed. Nose and Sinuses External Inspection of the Nose - no destructive lesion observed. Inspection of the nares - Left - quiet respiration. Right - quiet respiration. Mouth and Throat Lips - Upper Lip - no fissures observed, no pallor noted. Lower Lip - no fissures observed, no pallor noted. Nasopharynx - no discharge present. Oral Cavity/Oropharynx - Tongue - no dryness observed. Oral Mucosa - no cyanosis observed. Hypopharynx - no evidence of airway distress observed.  Chest and Lung Exam Inspection Movements - Normal and Symmetrical. Accessory muscles - No use of accessory muscles in breathing. Palpation Palpation of the chest reveals - Non-tender. Auscultation Breath sounds - Normal and Clear.  Cardiovascular Auscultation Rhythm - Regular. Murmurs & Other Heart Sounds - Auscultation of the heart reveals - No Murmurs and No Systolic Clicks.  Abdomen Inspection Inspection of the abdomen reveals - No Visible peristalsis and No Abnormal pulsations. Umbilicus - No Bleeding, No Urine  drainage. Palpation/Percussion Palpation and Percussion of the abdomen reveal - Soft, Non Tender, No Rebound tenderness, No Rigidity (guarding) and No Cutaneous hyperesthesia. Note: Then with some supraumbilical diastases. No umbilical hernias. Obvious fullness and left lower quadrant somewhat mobile consistent with giant left colon.   Male Genitourinary Sexual Maturity Tanner 5 - Adult hair pattern and Adult penile size and shape.  Rectal Note: Normal external skin. No hemorrhoids. Normal sphincter tone. Rectum somewhat dilated  but functional. No prolapse. Good squeeze. No sphincter defects. No fissure. No fistula. No evidence of any discoordination defecation. At tip of finger a large stool ball felt.   Peripheral Vascular Upper Extremity Inspection - Left - No Cyanotic nailbeds, Not Ischemic. Right - No Cyanotic nailbeds, Not Ischemic.  Neurologic Neurologic evaluation reveals -normal attention span and ability to concentrate, able to name objects and repeat phrases. Appropriate fund of knowledge , normal sensation and normal coordination. Mental Status Affect - not angry, not paranoid. Cranial Nerves-Normal Bilaterally. Gait-Normal.  Neuropsychiatric Mental status exam performed with findings of-able to articulate well with normal speech/language, rate, volume and coherence, thought content normal with ability to perform basic computations and apply abstract reasoning and no evidence of hallucinations, delusions, obsessions or homicidal/suicidal ideation.  Musculoskeletal Global Assessment Spine, Ribs and Pelvis - no instability, subluxation or laxity. Right Upper Extremity - no instability, subluxation or laxity.  Lymphatic Head & Neck  General Head & Neck Lymphatics: Bilateral - Description - No Localized lymphadenopathy. Axillary  General Axillary Region: Bilateral - Description - No Localized lymphadenopathy. Femoral & Inguinal  Generalized Femoral &  Inguinal Lymphatics: Left - Description - No Localized lymphadenopathy. Right - Description - No Localized lymphadenopathy.    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 He is massively dilated colon with chronic stool in it. It is progressively worse despite his being on an aggressive regimen of fiber, laxatives, and antispasmodic medications. He's been followed closely by gastroenterology for almost a decade and is exhausted all options. He is had to be admitted and disimpacted in the past. That is not seen to be any strong evidence for difficulty with defecation or other such problems. He does not change the need to remove giant colon that has caused ulcers and hydronephrosis.  I think he would benefit from colonic resection. Discussed with my colorectal partner, Dr. Marcello Moores. This point I would recommend abdominal colectomy with ileorectal anastomosis. Hopefully minimize the chance of ileostomy.  He does have risks but he is only middle-aged without prior abdominal surgery, no history of prior cardiopulmonary issues, and does not smoke. Good activity level. He and his caretaker wish to be aggressive and proceed with surgery.  Since his colon is so giant, we'll do open approach since we'll need large incision to get it out.  Current Plans Schedule for Surgery The anatomy & physiology of the digestive tract was discussed. The pathophysiology of the colon was discussed. Natural history risks without surgery was discussed. I feel the risks of no intervention will lead to serious problems that outweigh the operative risks; therefore, I recommended a partial colectomy to remove the pathology. Minimally invasive (Robotic/Laparoscopic) & open techniques were discussed.  Risks such as bleeding, infection, abscess, leak, reoperation, possible ostomy, hernia, heart attack, death, and other risks  were discussed. I noted a good likelihood this will help address the problem. Goals of post-operative recovery were discussed as well. Need for adequate nutrition, daily bowel regimen and healthy physical activity, to optimize recovery was noted as well. We will work to minimize complications. Educational materials were available as well. Questions were answered. The patient expresses understanding & wishes to proceed with surgery. Pt Education - [facility] Colon Bowel Prep 2015 Miralax/Antibiotics Started Neomycin Sulfate 500MG , 2 (two) Tablet SEE NOTE, #6, 08/17/2014, No Refill. Local Order: TAKE TWO TABLETS AT 2 PM, 3 PM, AND 10 PM THE DAY  PRIOR TO SURGERY Started Flagyl 500MG , 2 (two) Tablet SEE NOTE, #6, 08/17/2014, No Refill. Local Order: Take at 2pm, 3pm, and 10pm the day prior to your colon operation Pt Education - [place] (Akeya Ryther) Pt Education - [facility] Abdominal Surgery (Severin Bou) Pt Education - [facility] Pelvic Floor Exercises (Kegels) and Dysfunction HCI (Caryssa Elzey)

## 2014-08-26 ENCOUNTER — Other Ambulatory Visit: Payer: Self-pay | Admitting: *Deleted

## 2014-08-26 DIAGNOSIS — E785 Hyperlipidemia, unspecified: Secondary | ICD-10-CM

## 2014-08-26 MED ORDER — PRAVASTATIN SODIUM 40 MG PO TABS
40.0000 mg | ORAL_TABLET | Freq: Every day | ORAL | Status: DC
Start: 2014-08-26 — End: 2014-08-28

## 2014-08-28 ENCOUNTER — Other Ambulatory Visit: Payer: Self-pay | Admitting: Internal Medicine

## 2014-09-04 ENCOUNTER — Encounter: Payer: Self-pay | Admitting: *Deleted

## 2014-09-09 ENCOUNTER — Encounter: Payer: Medicare Other | Admitting: Internal Medicine

## 2014-09-15 ENCOUNTER — Telehealth: Payer: Self-pay | Admitting: Internal Medicine

## 2014-09-15 NOTE — Telephone Encounter (Signed)
Call to patient to confirm appointment for 09/16/14 at 8:45 lmtcb

## 2014-09-15 NOTE — Telephone Encounter (Signed)
A lady answers the phone but then acts like she is unable to hear me and hangs up.

## 2014-09-16 ENCOUNTER — Encounter: Payer: Self-pay | Admitting: Internal Medicine

## 2014-09-16 ENCOUNTER — Ambulatory Visit (INDEPENDENT_AMBULATORY_CARE_PROVIDER_SITE_OTHER): Payer: Medicare Other | Admitting: Internal Medicine

## 2014-09-16 VITALS — BP 122/69 | HR 108 | Temp 98.0°F | Ht 74.0 in | Wt 177.0 lb

## 2014-09-16 DIAGNOSIS — Z23 Encounter for immunization: Secondary | ICD-10-CM

## 2014-09-16 DIAGNOSIS — K5900 Constipation, unspecified: Secondary | ICD-10-CM

## 2014-09-16 DIAGNOSIS — E785 Hyperlipidemia, unspecified: Secondary | ICD-10-CM

## 2014-09-16 DIAGNOSIS — R000 Tachycardia, unspecified: Secondary | ICD-10-CM

## 2014-09-16 MED ORDER — MULTIVITAMINS PO CAPS: 1.0000 | ORAL_CAPSULE | Freq: Every day | ORAL | Status: DC

## 2014-09-16 NOTE — Assessment & Plan Note (Signed)
Cholesterol 130 at last check, continue pravastatin.  Recheck at next visit in the summer.

## 2014-09-16 NOTE — Progress Notes (Signed)
Subjective:    Patient ID: [name] Branch, male    DOB: Dec 11, 1965, 49 y.o.   MRN: [id number]  CC: routine follow up  HPI  [name] Branch is a 49yo man with PMH of HLD, HTN, MR, neurogenic bowel who presents for follow up.  He has a reported cardiomyopathy, but TTE from 2004 reports normal EF and wall motion.    He was recently admitted in September of 2015 for SIRS and was found to have fecal impaction, ARF and hydronephrosis.  He was seen by Dr. Olevia Perches after hospitalization and again on 12/30 where it was decided that he would likely need a total colectomy for his problem with obstipation.  This procedure is scheduled for 09/25/14.    Doing well, anxious about surgery.  Caretaker tells me he has had tachycardia on and off for at least 21 years and she has not been told he has anything concerning.  He has no active chest pain, no breathing issues, no nausea, vomiting, recent fever, chills.  He is able to walk briskly around the block without getting winded or having chest pain.  He is not smoking.  He has a reported history of cardiomyopathy, but I have not been able to find any evidence of that in the chart. TTE from 2004 had normal systolic function and no LVH, CT scan of the chest from 08/03/2014 showed a normal heart size.  There is an EKG which shows sinus tach and then Afib, however, this was during his acute illness in the ICU.  Would recommend repeat EKG prior to surgery for resolution.   As pertains to his ARF, his renal function returned to normal during hospitalization and last checked in December 2015 remained normal.  His last lipid profile from the summer showed an LDL of 130, which was stable from previous.  He is on a stable dose of pravastatin.  This is the main issue for which [name] is seen in the internal medicine clinic.    We reviewed his medications.  He takes a multivitamin and pravastatin which I provide.  Caretaker reports no other changes to his meds.   Review of  Systems  Constitutional: Negative for diaphoresis, activity change, appetite change and fatigue.  Eyes: Negative for photophobia and visual disturbance. Eye redness: chronic.  Respiratory: Negative for cough, chest tightness, shortness of breath and wheezing.   Cardiovascular: Negative for chest pain, palpitations and leg swelling.  Gastrointestinal: Positive for constipation. Negative for nausea, vomiting, abdominal pain and diarrhea.  Genitourinary: Negative for frequency, flank pain, enuresis and difficulty urinating.  Musculoskeletal: Negative for back pain and gait problem.  Neurological: Negative for dizziness, weakness and light-headedness.  Psychiatric/Behavioral: The patient is nervous/anxious (about surgery).        Objective:   Physical Exam  Constitutional: He is oriented to person, place, and time. He appears well-developed and well-nourished. No distress.  HENT:  Head: Normocephalic and atraumatic.  Eyes: Pupils are equal, round, and reactive to light. No scleral icterus.  Conjunctivae injected and with proptosis, this is chronic for him  Neck: Neck supple. No thyromegaly present.  Cardiovascular: Regular rhythm.   No murmur heard. Mild tachycardia  Pulmonary/Chest: Effort normal and breath sounds normal. No respiratory distress. He has no wheezes.  Abdominal: Soft. Bowel sounds are normal.  Musculoskeletal: He exhibits no edema or tenderness.  Lymphadenopathy:    He has no cervical adenopathy.  Neurological: He is alert and oriented to person, place, and time.  Skin: Skin is  warm and dry. No rash noted. No erythema.  Psychiatric: He has a normal mood and affect. His behavior is normal.       Assessment & Plan:  RTC in 6 months

## 2014-09-16 NOTE — Assessment & Plan Note (Signed)
Continues to have a mild regular tachycardia (Recheck after walking in was 104).  He has no chest pain, no breathing issues.  This is likely sinus tachycardia.

## 2014-09-16 NOTE — Assessment & Plan Note (Signed)
He is due for surgery on 09/25/14 for colonic inertia.  He is anxious about surgery.  I have reviewed Dr. Nichola Sizer notes and agree that this is likely the correct course for him given his recent hospitalization.

## 2014-09-16 NOTE — Patient Instructions (Signed)
Please schedule a follow up visit within the next 6 months.   For your medications:   Please bring all of your pill  Bottles with you to each visit.  This will help make sure that we have an up to date list of all the medications you are taking.  Please also bring any over the counter herbal medications you are taking (not including advil, tylenol, etc.)  Please continue taking all of your medications as prescribed.  Thank you!

## 2014-09-17 NOTE — Patient Instructions (Addendum)
[name] Branch  09/17/2014   Your procedure is scheduled on: 09/25/14   Report to [facility] Main  Entrance and follow signs to               [facility] a t9:00AM.   Call this number if you have problems the morning of surgery [phone]   Remember:  Do not eat food or drink liquids :After Midnight.             FOLLOW BOWEL PREP     Take these medicines the morning of surgery with A SIP OF WATER:CLOZAPINE            NO ASPIRIN / IBUPROFEN / ALEVE / SUPPLEMENTS 5 DAYS PREOP                                 You may not have any metal on your body including hair pins and              piercings  Do not wear jewelry, make-up, lotions, powders or perfumes.             Do not wear nail polish.  Do not shave  48 hours prior to surgery.              Men may shave face and neck.   Do not bring valuables to the hospital. [name] Branch.  Contacts, dentures or bridgework may not be worn into surgery.  Leave suitcase in the car. After surgery it may be brought to your room.     Patients discharged the day of surgery will not be allowed to drive home.  Name and phone number of your driver:  Special Instructions: N/A              Please read over the following fact sheets you were given: _____________________________________________________________________                                                     [name] Branch  Before surgery, you can play an important role.  Because skin is not sterile, your skin needs to be as free of germs as possible.  You can reduce the number of germs on your skin by washing with CHG (chlorahexidine gluconate) soap before surgery.  CHG is an antiseptic cleaner which kills germs and bonds with the skin to continue killing germs even after washing. Please DO NOT use if you have an allergy to CHG or antibacterial soaps.  If your skin becomes reddened/irritated  stop using the CHG and inform your nurse when you arrive at Short Stay. Do not shave (including legs and underarms) for at least 48 hours prior to the first CHG shower.  You may shave your face. Please follow these instructions carefully:   1.  Shower with CHG Soap the night before surgery and the  morning of Surgery.   2.  If you choose to wash your hair, wash your hair first as usual with your  normal  Shampoo.   3.  After you shampoo, rinse your  hair and body thoroughly to remove the  shampoo.                                         4.  Use CHG as you would any other liquid soap.  You can apply chg directly  to the skin and wash . Gently wash with scrungie or clean wascloth    5.  Apply the CHG Soap to your body ONLY FROM THE NECK DOWN.   Do not use on open                           Wound or open sores. Avoid contact with eyes, ears mouth and genitals (private parts).                        Genitals (private parts) with your normal soap.              6.  Wash thoroughly, paying special attention to the area where your surgery  will be performed.   7.  Thoroughly rinse your body with warm water from the neck down.   8.  DO NOT shower/wash with your normal soap after using and rinsing off  the CHG Soap .                9.  Pat yourself dry with a clean towel.             10.  Wear clean pajamas.             11.  Place clean sheets on your bed the night of your first shower and do not  sleep with pets.  Day of Surgery : Do not apply any lotions/deodorants the morning of surgery.  Please wear clean clothes to the hospital/surgery center.  FAILURE TO FOLLOW THESE INSTRUCTIONS MAY RESULT IN THE CANCELLATION OF YOUR SURGERY    PATIENT SIGNATURE_________________________________  ______________________________________________________________________    WHAT IS A BLOOD TRANSFUSION? Blood Transfusion Information  A transfusion is the replacement of blood or some of its parts.  Blood is made up of multiple cells which provide different functions.  Red blood cells carry oxygen and are used for blood loss replacement.  White blood cells fight against infection.  Platelets control bleeding.  Plasma helps clot blood.  Other blood products are available for specialized needs, such as hemophilia or other clotting disorders. BEFORE THE TRANSFUSION  Who gives blood for transfusions?   Healthy volunteers who are fully evaluated to make sure their blood is safe. This is blood bank blood. Transfusion therapy is the safest it has ever been in the practice of medicine. Before blood is taken from a donor, a complete history is taken to make sure that person has no history of diseases nor engages in risky social behavior (examples are intravenous drug use or sexual activity with multiple partners). The donor's travel history is screened to minimize risk of transmitting infections, such as malaria. The donated blood is tested for signs of infectious diseases, such as HIV and hepatitis. The blood is then tested to be sure it is compatible with you in order to minimize the chance of a transfusion reaction. If you or a relative donates blood, this is often done in anticipation of surgery and is not appropriate for emergency situations. It takes many days  to process the donated blood. RISKS AND COMPLICATIONS Although transfusion therapy is very safe and saves many lives, the main dangers of transfusion include:   Getting an infectious disease.  Developing a transfusion reaction. This is an allergic reaction to something in the blood you were given. Every precaution is taken to prevent this. The decision to have a blood transfusion has been considered carefully by your caregiver before blood is given. Blood is not given unless the benefits outweigh the risks. AFTER THE TRANSFUSION  Right after receiving a blood transfusion, you will usually feel much better and more energetic. This is  especially true if your red blood cells have gotten low (anemic). The transfusion raises the level of the red blood cells which carry oxygen, and this usually causes an energy increase.  The nurse administering the transfusion will monitor you carefully for complications. HOME CARE INSTRUCTIONS  No special instructions are needed after a transfusion. You may find your energy is better. Speak with your caregiver about any limitations on activity for underlying diseases you may have. SEEK MEDICAL CARE IF:   Your condition is not improving after your transfusion.  You develop redness or irritation at the intravenous (IV) site. SEEK IMMEDIATE MEDICAL CARE IF:  Any of the following symptoms occur over the next 12 hours:  Shaking chills.  You have a temperature by mouth above 102 F (38.9 C), not controlled by medicine.  Chest, back, or muscle pain.  People around you feel you are not acting correctly or are confused.  Shortness of breath or difficulty breathing.  Dizziness and fainting.  You get a rash or develop hives.  You have a decrease in urine output.  Your urine turns a dark color or changes to pink, red, or brown. Any of the following symptoms occur over the next 10 days:  You have a temperature by mouth above 102 F (38.9 C), not controlled by medicine.  Shortness of breath.  Weakness after normal activity.  The white part of the eye turns yellow (jaundice).  You have a decrease in the amount of urine or are urinating less often.  Your urine turns a dark color or changes to pink, red, or brown. Document Released: 07/14/2000 Document Revised: 10/09/2011 Document Reviewed: 03/02/2008 [facility] Patient Information 2014 [place], [place].  _______________________________________________________________________

## 2014-09-18 ENCOUNTER — Encounter (HOSPITAL_COMMUNITY)
Admission: RE | Admit: 2014-09-18 | Discharge: 2014-09-18 | Disposition: A | Discharge status: Home / Self Care | Payer: Medicare Other | Source: Ambulatory Visit | Attending: Surgery | Admitting: Surgery

## 2014-09-18 ENCOUNTER — Encounter (HOSPITAL_COMMUNITY): Payer: Self-pay

## 2014-09-18 DIAGNOSIS — Z01818 Encounter for other preprocedural examination: Secondary | ICD-10-CM | POA: Diagnosis not present

## 2014-09-18 DIAGNOSIS — K599 Functional intestinal disorder, unspecified: Secondary | ICD-10-CM | POA: Insufficient documentation

## 2014-09-18 DIAGNOSIS — Z79899 Other long term (current) drug therapy: Secondary | ICD-10-CM | POA: Insufficient documentation

## 2014-09-18 HISTORY — DX: Unspecified intestinal obstruction, unspecified as to partial versus complete obstruction: K56.609

## 2014-09-18 LAB — COMPREHENSIVE METABOLIC PANEL
ALT: 24 U/L (ref 0–53)
AST: 23 U/L (ref 0–37)
Albumin: 4.1 g/dL (ref 3.5–5.2)
Alkaline Phosphatase: 112 U/L (ref 39–117)
Anion gap: 6 (ref 5–15)
BUN: 11 mg/dL (ref 6–23)
CALCIUM: 9.5 mg/dL (ref 8.4–10.5)
CHLORIDE: 105 mmol/L (ref 96–112)
CO2: 27 mmol/L (ref 19–32)
Creatinine, Ser: 0.92 mg/dL (ref 0.50–1.35)
GLUCOSE: 76 mg/dL (ref 70–99)
Potassium: 4.1 mmol/L (ref 3.5–5.1)
Sodium: 138 mmol/L (ref 135–145)
Total Bilirubin: 0.6 mg/dL (ref 0.3–1.2)
Total Protein: 7.3 g/dL (ref 6.0–8.3)

## 2014-09-18 LAB — CBC
HCT: 41.1 % (ref 39.0–52.0)
HEMOGLOBIN: 13.3 g/dL (ref 13.0–17.0)
MCH: 29 pg (ref 26.0–34.0)
MCHC: 32.4 g/dL (ref 30.0–36.0)
MCV: 89.7 fL (ref 78.0–100.0)
PLATELETS: 171 10*3/uL (ref 150–400)
RBC: 4.58 MIL/uL (ref 4.22–5.81)
RDW: 14.6 % (ref 11.5–15.5)
WBC: 3.2 10*3/uL — ABNORMAL LOW (ref 4.0–10.5)

## 2014-09-18 LAB — SURGICAL PCR SCREEN
MRSA, PCR: NEGATIVE
Staphylococcus aureus: NEGATIVE

## 2014-09-18 NOTE — Consult Note (Signed)
WOC ostomy consult note Preoperative stoma site selection per Dr. Johney [place]' request for markings for both a loop ileostomy and a permanent colostomy in the event that either is required.  Patient's abdomen is assessed in the sitting and standing positions.  He is a thin male and the abdominal rectus muscle is easily palpated.  Patient has lost a great deal of weight this year according to his caregiver and wears his pants quite low on this abdomen; there are many wrinkles across the umbilicus that will make pouching a challenge.  Both sites selected are in the upper quadrant.  The ileostomy site is located 7.5cm to the right of the umbilicus and 4cm above.  The colostomy site is located 5.5cm to the left of the umbilicus and 5cm above. Both sites are made with a surgical site marking pen and covered with a thin film transparent dressing.  Caregiver noted that patient is not likely to leave the dressing in place as he washes himself vigorously and will probably "pick the dressing off". Education provided: Limited information provided top caregiver.  Patient was cooperative, but restless today. [place] nursing team will follow, and will remain available to this patient, the nursing, surgical and medical teams.  Please re-consult if an ostomy is created intraoperatively on February 26th. Thanks, Maudie Flakes, MSN, RN, [place], [place], [place] [phone])

## 2014-09-19 LAB — HEMOGLOBIN A1C
HEMOGLOBIN A1C: 6 % — AB (ref 4.8–5.6)
MEAN PLASMA GLUCOSE: 126 mg/dL

## 2014-09-22 NOTE — Progress Notes (Signed)
Spoke with dr Gwyndolyn Saxon [facility], aware of 04-07-14 ekg results and 09-08-14 ekg results and pt medical history, pt ok for surgery with 09-08-14 ekg per dr firzgerald.

## 2014-09-24 MED ORDER — GENTAMICIN SULFATE 40 MG/ML IJ SOLN
INTRAMUSCULAR | Status: DC
  Filled 2014-09-24: qty 6

## 2014-09-24 MED ORDER — BUPIVACAINE 0.25 % ON-Q PUMP DUAL CATH 300 ML
300.0000 mL | INJECTION | Status: DC
  Filled 2014-09-24: qty 300

## 2014-09-25 ENCOUNTER — Encounter (HOSPITAL_COMMUNITY)
Admission: RE | Disposition: A | Discharge status: Home / Self Care | Payer: Self-pay | Source: Ambulatory Visit | Attending: Surgery

## 2014-09-25 ENCOUNTER — Encounter (HOSPITAL_COMMUNITY): Payer: Self-pay | Admitting: *Deleted

## 2014-09-25 ENCOUNTER — Inpatient Hospital Stay (HOSPITAL_COMMUNITY)
Admission: RE | Admit: 2014-09-25 | Discharge: 2014-09-30 | DRG: 330 | Disposition: A | Discharge status: Home / Self Care | Payer: Medicare Other | Source: Ambulatory Visit | Attending: Surgery | Admitting: Surgery

## 2014-09-25 ENCOUNTER — Inpatient Hospital Stay (HOSPITAL_COMMUNITY): Payer: Medicare Other | Admitting: Anesthesiology

## 2014-09-25 DIAGNOSIS — I429 Cardiomyopathy, unspecified: Secondary | ICD-10-CM | POA: Diagnosis present

## 2014-09-25 DIAGNOSIS — E785 Hyperlipidemia, unspecified: Secondary | ICD-10-CM | POA: Diagnosis present

## 2014-09-25 DIAGNOSIS — K5901 Slow transit constipation: Secondary | ICD-10-CM | POA: Diagnosis not present

## 2014-09-25 DIAGNOSIS — I10 Essential (primary) hypertension: Secondary | ICD-10-CM | POA: Diagnosis present

## 2014-09-25 DIAGNOSIS — D869 Sarcoidosis, unspecified: Secondary | ICD-10-CM | POA: Diagnosis present

## 2014-09-25 DIAGNOSIS — K633 Ulcer of intestine: Secondary | ICD-10-CM | POA: Diagnosis not present

## 2014-09-25 DIAGNOSIS — K593 Megacolon, not elsewhere classified: Secondary | ICD-10-CM | POA: Diagnosis present

## 2014-09-25 DIAGNOSIS — Z79899 Other long term (current) drug therapy: Secondary | ICD-10-CM

## 2014-09-25 DIAGNOSIS — G4730 Sleep apnea, unspecified: Secondary | ICD-10-CM | POA: Diagnosis present

## 2014-09-25 DIAGNOSIS — F209 Schizophrenia, unspecified: Secondary | ICD-10-CM | POA: Diagnosis present

## 2014-09-25 DIAGNOSIS — K599 Functional intestinal disorder, unspecified: Secondary | ICD-10-CM

## 2014-09-25 DIAGNOSIS — H0520 Unspecified exophthalmos: Secondary | ICD-10-CM | POA: Diagnosis present

## 2014-09-25 DIAGNOSIS — F79 Unspecified intellectual disabilities: Secondary | ICD-10-CM | POA: Diagnosis present

## 2014-09-25 DIAGNOSIS — K592 Neurogenic bowel, not elsewhere classified: Secondary | ICD-10-CM

## 2014-09-25 DIAGNOSIS — H409 Unspecified glaucoma: Secondary | ICD-10-CM | POA: Diagnosis present

## 2014-09-25 DIAGNOSIS — N1330 Unspecified hydronephrosis: Secondary | ICD-10-CM | POA: Diagnosis present

## 2014-09-25 HISTORY — PX: COLECTOMY: SHX59

## 2014-09-25 HISTORY — PX: PROCTOSCOPY: SHX2266

## 2014-09-25 HISTORY — DX: Neurogenic bowel, not elsewhere classified: K59.2

## 2014-09-25 LAB — CBC WITH DIFFERENTIAL/PLATELET
Basophils Absolute: 0 10*3/uL (ref 0.0–0.1)
Basophils Relative: 0 % (ref 0–1)
Eosinophils Absolute: 0 10*3/uL (ref 0.0–0.7)
Eosinophils Relative: 0 % (ref 0–5)
HEMATOCRIT: 29.6 % — AB (ref 39.0–52.0)
HEMOGLOBIN: 9.6 g/dL — AB (ref 13.0–17.0)
Lymphocytes Relative: 2 % — ABNORMAL LOW (ref 12–46)
Lymphs Abs: 0.2 10*3/uL — ABNORMAL LOW (ref 0.7–4.0)
MCH: 29.2 pg (ref 26.0–34.0)
MCHC: 32.4 g/dL (ref 30.0–36.0)
MCV: 90 fL (ref 78.0–100.0)
MONOS PCT: 10 % (ref 3–12)
Monocytes Absolute: 0.9 10*3/uL (ref 0.1–1.0)
NEUTROS ABS: 8.2 10*3/uL — AB (ref 1.7–7.7)
Neutrophils Relative %: 88 % — ABNORMAL HIGH (ref 43–77)
Platelets: 132 10*3/uL — ABNORMAL LOW (ref 150–400)
RBC: 3.29 MIL/uL — ABNORMAL LOW (ref 4.22–5.81)
RDW: 14.5 % (ref 11.5–15.5)
WBC: 9.2 10*3/uL (ref 4.0–10.5)

## 2014-09-25 LAB — ABO/RH: ABO/RH(D): A POS

## 2014-09-25 LAB — TYPE AND SCREEN
ABO/RH(D): A POS
Antibody Screen: NEGATIVE

## 2014-09-25 SURGERY — COLECTOMY, TOTAL
Anesthesia: General | Site: Rectum

## 2014-09-25 MED ORDER — CEFOTETAN DISODIUM-DEXTROSE 2-2.08 GM-% IV SOLR
INTRAVENOUS | Status: AC
  Filled 2014-09-25: qty 50

## 2014-09-25 MED ORDER — SODIUM CHLORIDE 0.9 % IV SOLN
INTRAVENOUS | Status: DC
  Administered 2014-09-26: 06:00:00 via INTRAVENOUS

## 2014-09-25 MED ORDER — ONDANSETRON HCL 4 MG/2ML IJ SOLN: 4.0000 mg | Freq: Four times a day (QID) | INTRAMUSCULAR | Status: DC | PRN

## 2014-09-25 MED ORDER — ALVIMOPAN 12 MG PO CAPS
12.0000 mg | ORAL_CAPSULE | Freq: Once | ORAL | Status: AC
  Administered 2014-09-25: 12 mg via ORAL
  Filled 2014-09-25: qty 1

## 2014-09-25 MED ORDER — METOPROLOL TARTRATE 1 MG/ML IV SOLN
5.0000 mg | Freq: Four times a day (QID) | INTRAVENOUS | Status: DC
  Administered 2014-09-26 – 2014-09-27 (×2): 5 mg via INTRAVENOUS
  Filled 2014-09-25 (×15): qty 5

## 2014-09-25 MED ORDER — METOCLOPRAMIDE HCL 5 MG/ML IJ SOLN
INTRAMUSCULAR | Status: DC | PRN
  Administered 2014-09-25: 10 mg via INTRAVENOUS

## 2014-09-25 MED ORDER — ENSURE COMPLETE PO LIQD
237.0000 mL | Freq: Two times a day (BID) | ORAL | Status: DC
  Administered 2014-09-26 – 2014-09-30 (×9): 237 mL via ORAL

## 2014-09-25 MED ORDER — ACETAMINOPHEN 500 MG PO TABS
1000.0000 mg | ORAL_TABLET | Freq: Three times a day (TID) | ORAL | Status: DC
  Administered 2014-09-25 – 2014-09-30 (×13): 1000 mg via ORAL
  Filled 2014-09-25 (×17): qty 2

## 2014-09-25 MED ORDER — ONDANSETRON HCL 4 MG/2ML IJ SOLN
INTRAMUSCULAR | Status: DC | PRN
  Administered 2014-09-25: 4 mg via INTRAVENOUS

## 2014-09-25 MED ORDER — ROCURONIUM BROMIDE 100 MG/10ML IV SOLN
INTRAVENOUS | Status: DC | PRN
  Administered 2014-09-25 (×4): 10 mg via INTRAVENOUS
  Administered 2014-09-25: 40 mg via INTRAVENOUS

## 2014-09-25 MED ORDER — FLUVOXAMINE MALEATE 100 MG PO TABS
300.0000 mg | ORAL_TABLET | Freq: Every day | ORAL | Status: DC
  Administered 2014-09-25 – 2014-09-29 (×5): 300 mg via ORAL
  Filled 2014-09-25 (×6): qty 3

## 2014-09-25 MED ORDER — SACCHAROMYCES BOULARDII 250 MG PO CAPS
250.0000 mg | ORAL_CAPSULE | Freq: Two times a day (BID) | ORAL | Status: DC
Start: 2014-09-25 — End: 2014-09-30
  Administered 2014-09-25 – 2014-09-30 (×10): 250 mg via ORAL
  Filled 2014-09-25 (×11): qty 1

## 2014-09-25 MED ORDER — NEOSTIGMINE METHYLSULFATE 10 MG/10ML IV SOLN
INTRAVENOUS | Status: DC | PRN
  Administered 2014-09-25: 5 mg via INTRAVENOUS

## 2014-09-25 MED ORDER — LACTATED RINGERS IV BOLUS (SEPSIS): 1000.0000 mL | Freq: Three times a day (TID) | INTRAVENOUS | Status: AC | PRN

## 2014-09-25 MED ORDER — ALBUMIN HUMAN 5 % IV SOLN
INTRAVENOUS | Status: DC | PRN
  Administered 2014-09-25 (×4): via INTRAVENOUS

## 2014-09-25 MED ORDER — SODIUM CHLORIDE 0.9 % IV SOLN: 250.0000 mL | INTRAVENOUS | Status: DC | PRN

## 2014-09-25 MED ORDER — METOCLOPRAMIDE HCL 5 MG/ML IJ SOLN
INTRAMUSCULAR | Status: AC
  Filled 2014-09-25: qty 2

## 2014-09-25 MED ORDER — DIPHENHYDRAMINE HCL 12.5 MG/5ML PO ELIX: 12.5000 mg | ORAL_SOLUTION | Freq: Four times a day (QID) | ORAL | Status: DC | PRN

## 2014-09-25 MED ORDER — HEPARIN SODIUM (PORCINE) 5000 UNIT/ML IJ SOLN
5000.0000 [IU] | Freq: Three times a day (TID) | INTRAMUSCULAR | Status: DC
  Administered 2014-09-26 – 2014-09-30 (×13): 5000 [IU] via SUBCUTANEOUS
  Filled 2014-09-25 (×16): qty 1

## 2014-09-25 MED ORDER — ZOLPIDEM TARTRATE 5 MG PO TABS: 5.0000 mg | ORAL_TABLET | Freq: Every evening | ORAL | Status: DC | PRN

## 2014-09-25 MED ORDER — LATANOPROST 0.005 % OP SOLN
1.0000 [drp] | Freq: Every day | OPHTHALMIC | Status: DC
  Administered 2014-09-25 – 2014-09-29 (×5): 1 [drp] via OPHTHALMIC
  Filled 2014-09-25: qty 2.5

## 2014-09-25 MED ORDER — LACTATED RINGERS IV SOLN
INTRAVENOUS | Status: DC
  Administered 2014-09-25: 12:00:00 via INTRAVENOUS
  Administered 2014-09-25: 1000 mL via INTRAVENOUS
  Administered 2014-09-25 (×2): via INTRAVENOUS

## 2014-09-25 MED ORDER — DIPHENHYDRAMINE HCL 50 MG/ML IJ SOLN: 12.5000 mg | Freq: Four times a day (QID) | INTRAMUSCULAR | Status: DC | PRN

## 2014-09-25 MED ORDER — PHENYLEPHRINE HCL 10 MG/ML IJ SOLN
INTRAMUSCULAR | Status: AC
  Filled 2014-09-25: qty 1

## 2014-09-25 MED ORDER — DEXTROSE 5 % IV SOLN
2.0000 g | INTRAVENOUS | Status: AC
  Administered 2014-09-25: 2 g via INTRAVENOUS
  Filled 2014-09-25: qty 2

## 2014-09-25 MED ORDER — BUPIVACAINE-EPINEPHRINE 0.25% -1:200000 IJ SOLN
INTRAMUSCULAR | Status: AC
  Filled 2014-09-25: qty 1

## 2014-09-25 MED ORDER — FENTANYL CITRATE 0.05 MG/ML IJ SOLN
INTRAMUSCULAR | Status: DC | PRN
  Administered 2014-09-25: 100 ug via INTRAVENOUS
  Administered 2014-09-25: 25 ug via INTRAVENOUS
  Administered 2014-09-25: 100 ug via INTRAVENOUS
  Administered 2014-09-25: 50 ug via INTRAVENOUS
  Administered 2014-09-25: 25 ug via INTRAVENOUS
  Administered 2014-09-25: 50 ug via INTRAVENOUS

## 2014-09-25 MED ORDER — MIDAZOLAM HCL 2 MG/2ML IJ SOLN
INTRAMUSCULAR | Status: AC
  Filled 2014-09-25: qty 2

## 2014-09-25 MED ORDER — HYDROMORPHONE HCL 1 MG/ML IJ SOLN
INTRAMUSCULAR | Status: AC
  Filled 2014-09-25: qty 1

## 2014-09-25 MED ORDER — ALBUMIN HUMAN 5 % IV SOLN
INTRAVENOUS | Status: AC
  Filled 2014-09-25: qty 250

## 2014-09-25 MED ORDER — NAPROXEN 500 MG PO TABS: 500.0000 mg | ORAL_TABLET | Freq: Two times a day (BID) | ORAL | Status: DC

## 2014-09-25 MED ORDER — DEXTROSE 5 % IV SOLN
2.0000 g | Freq: Two times a day (BID) | INTRAVENOUS | Status: AC
  Administered 2014-09-26: 2 g via INTRAVENOUS
  Filled 2014-09-25: qty 2

## 2014-09-25 MED ORDER — HYDROMORPHONE HCL 1 MG/ML IJ SOLN
0.5000 mg | INTRAMUSCULAR | Status: DC | PRN
  Administered 2014-09-25: 2 mg via INTRAVENOUS
  Administered 2014-09-26: 0.5 mg via INTRAVENOUS
  Administered 2014-09-27: 1 mg via INTRAVENOUS
  Administered 2014-09-28: 2 mg via INTRAVENOUS
  Filled 2014-09-25: qty 2
  Filled 2014-09-25 (×2): qty 1
  Filled 2014-09-25: qty 2

## 2014-09-25 MED ORDER — PROPOFOL 10 MG/ML IV BOLUS
INTRAVENOUS | Status: AC
  Filled 2014-09-25: qty 20

## 2014-09-25 MED ORDER — MULTIVITAMINS PO CAPS: 1.0000 | ORAL_CAPSULE | Freq: Every day | ORAL | Status: DC

## 2014-09-25 MED ORDER — ALVIMOPAN 12 MG PO CAPS
12.0000 mg | ORAL_CAPSULE | Freq: Two times a day (BID) | ORAL | Status: DC
  Administered 2014-09-26 – 2014-09-27 (×4): 12 mg via ORAL
  Filled 2014-09-25 (×6): qty 1

## 2014-09-25 MED ORDER — HYDROMORPHONE HCL 2 MG/ML IJ SOLN
INTRAMUSCULAR | Status: AC
  Filled 2014-09-25: qty 1

## 2014-09-25 MED ORDER — CLOZAPINE 25 MG PO TABS
125.0000 mg | ORAL_TABLET | Freq: Two times a day (BID) | ORAL | Status: DC
  Administered 2014-09-25 – 2014-09-30 (×10): 125 mg via ORAL
  Filled 2014-09-25 (×11): qty 1

## 2014-09-25 MED ORDER — DEXAMETHASONE SODIUM PHOSPHATE 4 MG/ML IJ SOLN
INTRAMUSCULAR | Status: DC | PRN
  Administered 2014-09-25: 10 mg via INTRAVENOUS

## 2014-09-25 MED ORDER — BUPIVACAINE-EPINEPHRINE (PF) 0.25% -1:200000 IJ SOLN
INTRAMUSCULAR | Status: AC
  Filled 2014-09-25: qty 30

## 2014-09-25 MED ORDER — ALBUMIN HUMAN 5 % IV SOLN
INTRAVENOUS | Status: AC
  Filled 2014-09-25: qty 500

## 2014-09-25 MED ORDER — OXYCODONE HCL 5 MG PO TABS: 5.0000 mg | ORAL_TABLET | ORAL | Status: DC | PRN

## 2014-09-25 MED ORDER — CHLORHEXIDINE GLUCONATE 4 % EX LIQD: 60.0000 mL | Freq: Once | CUTANEOUS | Status: DC

## 2014-09-25 MED ORDER — HYDROMORPHONE HCL 1 MG/ML IJ SOLN
0.2500 mg | INTRAMUSCULAR | Status: DC | PRN
  Administered 2014-09-25 (×3): 0.5 mg via INTRAVENOUS

## 2014-09-25 MED ORDER — FENTANYL CITRATE 0.05 MG/ML IJ SOLN
INTRAMUSCULAR | Status: AC
  Filled 2014-09-25: qty 2

## 2014-09-25 MED ORDER — SUCCINYLCHOLINE CHLORIDE 20 MG/ML IJ SOLN
INTRAMUSCULAR | Status: DC | PRN
Start: 2014-09-25 — End: 2014-09-25
  Administered 2014-09-25: 100 mg via INTRAVENOUS

## 2014-09-25 MED ORDER — DEXAMETHASONE SODIUM PHOSPHATE 10 MG/ML IJ SOLN
INTRAMUSCULAR | Status: AC
  Filled 2014-09-25: qty 1

## 2014-09-25 MED ORDER — PHENYLEPHRINE 40 MCG/ML (10ML) SYRINGE FOR IV PUSH (FOR BLOOD PRESSURE SUPPORT)
PREFILLED_SYRINGE | INTRAVENOUS | Status: AC
  Filled 2014-09-25: qty 10

## 2014-09-25 MED ORDER — OXYCODONE HCL 5 MG/5ML PO SOLN
5.0000 mg | Freq: Once | ORAL | Status: DC | PRN
  Filled 2014-09-25: qty 5

## 2014-09-25 MED ORDER — PROPOFOL 10 MG/ML IV BOLUS
INTRAVENOUS | Status: DC | PRN
  Administered 2014-09-25: 180 mg via INTRAVENOUS

## 2014-09-25 MED ORDER — OXYCODONE HCL 5 MG PO TABS: 5.0000 mg | ORAL_TABLET | Freq: Once | ORAL | Status: DC | PRN

## 2014-09-25 MED ORDER — PROMETHAZINE HCL 25 MG/ML IJ SOLN: 6.2500 mg | INTRAMUSCULAR | Status: DC | PRN

## 2014-09-25 MED ORDER — BUPIVACAINE-EPINEPHRINE 0.25% -1:200000 IJ SOLN
INTRAMUSCULAR | Status: DC | PRN
  Administered 2014-09-25: 80 mL

## 2014-09-25 MED ORDER — PHENYLEPHRINE HCL 10 MG/ML IJ SOLN
INTRAMUSCULAR | Status: DC | PRN
  Administered 2014-09-25: 80 ug via INTRAVENOUS
  Administered 2014-09-25: 160 ug via INTRAVENOUS
  Administered 2014-09-25 (×5): 80 ug via INTRAVENOUS
  Administered 2014-09-25: 160 ug via INTRAVENOUS

## 2014-09-25 MED ORDER — EPHEDRINE SULFATE 50 MG/ML IJ SOLN
INTRAMUSCULAR | Status: DC | PRN
Start: 2014-09-25 — End: 2014-09-25
  Administered 2014-09-25 (×4): 10 mg via INTRAVENOUS
  Administered 2014-09-25 (×3): 5 mg via INTRAVENOUS

## 2014-09-25 MED ORDER — LORAZEPAM 2 MG/ML IJ SOLN: 0.5000 mg | Freq: Three times a day (TID) | INTRAMUSCULAR | Status: DC | PRN

## 2014-09-25 MED ORDER — BUPIVACAINE 0.25 % ON-Q PUMP DUAL CATH 300 ML: 300.0000 mL | INJECTION | Status: DC

## 2014-09-25 MED ORDER — PHENYLEPHRINE HCL 10 MG/ML IJ SOLN
10.0000 mg | INTRAMUSCULAR | Status: DC | PRN
  Administered 2014-09-25: 50 ug/min via INTRAVENOUS

## 2014-09-25 MED ORDER — ONDANSETRON HCL 4 MG PO TABS: 4.0000 mg | ORAL_TABLET | Freq: Four times a day (QID) | ORAL | Status: DC | PRN

## 2014-09-25 MED ORDER — ONDANSETRON HCL 4 MG/2ML IJ SOLN
INTRAMUSCULAR | Status: AC
  Filled 2014-09-25: qty 2

## 2014-09-25 MED ORDER — ADULT MULTIVITAMIN W/MINERALS CH
1.0000 | ORAL_TABLET | Freq: Every day | ORAL | Status: DC
  Administered 2014-09-26 – 2014-09-30 (×5): 1 via ORAL
  Filled 2014-09-25 (×5): qty 1

## 2014-09-25 MED ORDER — ENSURE COMPLETE SHAKE PO LIQD: 1.0000 | Freq: Two times a day (BID) | ORAL | Status: DC

## 2014-09-25 MED ORDER — SODIUM CHLORIDE 0.9 % IJ SOLN
3.0000 mL | INTRAMUSCULAR | Status: DC | PRN
  Administered 2014-09-28: 3 mL via INTRAVENOUS
  Filled 2014-09-25: qty 3

## 2014-09-25 MED ORDER — HEPARIN SODIUM (PORCINE) 5000 UNIT/ML IJ SOLN
5000.0000 [IU] | Freq: Once | INTRAMUSCULAR | Status: AC
  Administered 2014-09-25: 5000 [IU] via SUBCUTANEOUS
  Filled 2014-09-25: qty 1

## 2014-09-25 MED ORDER — SODIUM CHLORIDE 0.9 % IV SOLN
INTRAVENOUS | Status: DC | PRN
  Administered 2014-09-25: 14:00:00 via INTRAPERITONEAL

## 2014-09-25 MED ORDER — EPHEDRINE SULFATE 50 MG/ML IJ SOLN
INTRAMUSCULAR | Status: AC
  Filled 2014-09-25: qty 1

## 2014-09-25 MED ORDER — HYDROMORPHONE HCL 1 MG/ML IJ SOLN
INTRAMUSCULAR | Status: AC
  Administered 2014-09-25: 0.5 mg via INTRAVENOUS
  Filled 2014-09-25: qty 1

## 2014-09-25 MED ORDER — GLYCOPYRROLATE 0.2 MG/ML IJ SOLN
INTRAMUSCULAR | Status: DC | PRN
  Administered 2014-09-25: 0.6 mg via INTRAVENOUS

## 2014-09-25 MED ORDER — ROCURONIUM BROMIDE 100 MG/10ML IV SOLN
INTRAVENOUS | Status: AC
  Filled 2014-09-25: qty 1

## 2014-09-25 MED ORDER — PRAVASTATIN SODIUM 40 MG PO TABS
40.0000 mg | ORAL_TABLET | Freq: Every day | ORAL | Status: DC
  Administered 2014-09-26 – 2014-09-30 (×5): 40 mg via ORAL
  Filled 2014-09-25 (×5): qty 1

## 2014-09-25 MED ORDER — SODIUM CHLORIDE 0.9 % IJ SOLN
3.0000 mL | Freq: Two times a day (BID) | INTRAMUSCULAR | Status: DC
  Administered 2014-09-26 – 2014-09-27 (×3): 3 mL via INTRAVENOUS

## 2014-09-25 MED ORDER — HALOPERIDOL LACTATE 5 MG/ML IJ SOLN: 2.0000 mg | Freq: Four times a day (QID) | INTRAMUSCULAR | Status: DC | PRN

## 2014-09-25 MED ORDER — MIDAZOLAM HCL 5 MG/5ML IJ SOLN
INTRAMUSCULAR | Status: DC | PRN
  Administered 2014-09-25: 2 mg via INTRAVENOUS

## 2014-09-25 MED ORDER — FENTANYL CITRATE 0.05 MG/ML IJ SOLN
INTRAMUSCULAR | Status: AC
  Filled 2014-09-25: qty 5

## 2014-09-25 MED ORDER — HYDROMORPHONE HCL 1 MG/ML IJ SOLN
INTRAMUSCULAR | Status: DC | PRN
  Administered 2014-09-25 (×4): 0.5 mg via INTRAVENOUS

## 2014-09-25 MED ORDER — 0.9 % SODIUM CHLORIDE (POUR BTL) OPTIME
TOPICAL | Status: DC | PRN
  Administered 2014-09-25: 2000 mL

## 2014-09-25 MED ORDER — ALUM & MAG HYDROXIDE-SIMETH 200-200-20 MG/5ML PO SUSP: 30.0000 mL | Freq: Four times a day (QID) | ORAL | Status: DC | PRN

## 2014-09-25 MED ORDER — TRAVOPROST 0.004 % OP SOLN: 1.0000 [drp] | Freq: Every day | OPHTHALMIC | Status: DC

## 2014-09-25 SURGICAL SUPPLY — 70 items
BAG DECANTER FOR FLEXI CONT (MISCELLANEOUS) ×2 IMPLANT
BLADE EXTENDED COATED 6.5IN (ELECTRODE) ×4 IMPLANT
BLADE HEX COATED 2.75 (ELECTRODE) ×4 IMPLANT
BLADE SURG SZ10 CARB STEEL (BLADE) ×4 IMPLANT
CATH KIT ON-Q SILVERSOAK 7.5 (CATHETERS) IMPLANT
CATH KIT ON-Q SILVERSOAK 7.5IN (CATHETERS) ×8 IMPLANT
CELLS DAT CNTRL 66122 CELL SVR (MISCELLANEOUS) IMPLANT
COUNTER NEEDLE 20 DBL MAG RED (NEEDLE) ×6 IMPLANT
COVER MAYO STAND STRL (DRAPES) ×8 IMPLANT
DECANTER SPIKE VIAL GLASS SM (MISCELLANEOUS) ×4 IMPLANT
DRAIN CHANNEL 19F RND (DRAIN) IMPLANT
DRAPE LAPAROSCOPIC ABDOMINAL (DRAPES) ×4 IMPLANT
DRAPE SHEET LG 3/4 BI-LAMINATE (DRAPES) ×4 IMPLANT
DRAPE UTILITY XL STRL (DRAPES) ×8 IMPLANT
DRAPE WARM FLUID 44X44 (DRAPE) ×4 IMPLANT
DRSG OPSITE POSTOP 4X10 (GAUZE/BANDAGES/DRESSINGS) ×2 IMPLANT
DRSG OPSITE POSTOP 4X6 (GAUZE/BANDAGES/DRESSINGS) IMPLANT
DRSG OPSITE POSTOP 4X8 (GAUZE/BANDAGES/DRESSINGS) IMPLANT
DRSG TEGADERM 2-3/8X2-3/4 SM (GAUZE/BANDAGES/DRESSINGS) ×4 IMPLANT
DRSG TEGADERM 4X4.75 (GAUZE/BANDAGES/DRESSINGS) ×4 IMPLANT
ELECT REM PT RETURN 9FT ADLT (ELECTROSURGICAL) ×4
ELECTRODE REM PT RTRN 9FT ADLT (ELECTROSURGICAL) ×2 IMPLANT
GAUZE SPONGE 4X4 12PLY STRL (GAUZE/BANDAGES/DRESSINGS) ×4 IMPLANT
GLOVE ECLIPSE 8.0 STRL XLNG CF (GLOVE) ×8 IMPLANT
GLOVE INDICATOR 8.0 STRL GRN (GLOVE) ×8 IMPLANT
GLOVE SURG SS PI 6.5 STRL IVOR (GLOVE) ×10 IMPLANT
GOWN STRL REUS W/TWL 2XL LVL3 (GOWN DISPOSABLE) ×4 IMPLANT
GOWN STRL REUS W/TWL XL LVL3 (GOWN DISPOSABLE) ×20 IMPLANT
GUIDE PIN THREADED (PIN) ×4
KIT BASIN OR (CUSTOM PROCEDURE TRAY) ×6 IMPLANT
LEGGING LITHOTOMY PAIR STRL (DRAPES) ×4 IMPLANT
LIGASURE IMPACT 36 18CM CVD LR (INSTRUMENTS) ×2 IMPLANT
PACK GENERAL/GYN (CUSTOM PROCEDURE TRAY) ×4 IMPLANT
PENCIL BUTTON HOLSTER BLD 10FT (ELECTRODE) ×4 IMPLANT
PIN GUIDE THREADED (PIN) IMPLANT
PUMP PAIN ON-Q (MISCELLANEOUS) ×2 IMPLANT
RETRACTOR WND ALEXIS 18 MED (MISCELLANEOUS) IMPLANT
RTRCTR WOUND ALEXIS 18CM MED (MISCELLANEOUS)
SEALER TISSUE G2 CVD JAW 35 (ENDOMECHANICALS) IMPLANT
SEALER TISSUE G2 CVD JAW 45CM (ENDOMECHANICALS)
SEALER TISSUE G2 STRG ARTC 35C (ENDOMECHANICALS) IMPLANT
SPONGE DRAIN TRACH 4X4 STRL 2S (GAUZE/BANDAGES/DRESSINGS) ×2 IMPLANT
SPONGE LAP 18X18 X RAY DECT (DISPOSABLE) ×8 IMPLANT
STAPLER 90 3.5 STAND SLIM (STAPLE) ×4
STAPLER 90 3.5 STD SLIM (STAPLE) IMPLANT
STAPLER CUT CVD 40MM BLUE (STAPLE) ×2 IMPLANT
STAPLER PROXIMATE 75MM BLUE (STAPLE) ×2 IMPLANT
STAPLER VISISTAT 35W (STAPLE) ×4 IMPLANT
SUCTION POOLE TIP (SUCTIONS) ×4 IMPLANT
SUT MNCRL AB 4-0 PS2 18 (SUTURE) ×2 IMPLANT
SUT PDS AB 1 CTX 36 (SUTURE) IMPLANT
SUT PDS AB 1 TP1 96 (SUTURE) ×4 IMPLANT
SUT SILK 0 (SUTURE) ×4
SUT SILK 0 30XBRD TIE 6 (SUTURE) IMPLANT
SUT SILK 2 0 (SUTURE) ×12
SUT SILK 2 0 SH CR/8 (SUTURE) ×4 IMPLANT
SUT SILK 2-0 18XBRD TIE 12 (SUTURE) ×2 IMPLANT
SUT SILK 2-0 30XBRD TIE 12 (SUTURE) IMPLANT
SUT SILK 3 0 (SUTURE) ×4
SUT SILK 3 0 SH CR/8 (SUTURE) ×4 IMPLANT
SUT SILK 3-0 18XBRD TIE 12 (SUTURE) ×2 IMPLANT
SYR BULB IRRIGATION 50ML (SYRINGE) ×4 IMPLANT
SYS LAPSCP GELPORT 120MM (MISCELLANEOUS)
SYSTEM LAPSCP GELPORT 120MM (MISCELLANEOUS) IMPLANT
TAPE UMBILICAL COTTON 1/8X30 (MISCELLANEOUS) ×2 IMPLANT
TOWEL OR 17X26 10 PK STRL BLUE (TOWEL DISPOSABLE) ×8 IMPLANT
TOWEL OR NON WOVEN STRL DISP B (DISPOSABLE) ×8 IMPLANT
TRAY FOLEY CATH 14FRSI W/METER (CATHETERS) ×2 IMPLANT
TUNNELER SHEATH ON-Q 16GX12 DP (PAIN MANAGEMENT) ×2 IMPLANT
YANKAUER SUCT BULB TIP 10FT TU (MISCELLANEOUS) ×4 IMPLANT

## 2014-09-25 NOTE — Anesthesia Procedure Notes (Addendum)
Procedure Name: Intubation Date/Time: 09/25/2014 11:15 AM Performed by: Michael Boston Pre-anesthesia Checklist: Patient identified, Emergency Drugs available, Suction available and Patient being monitored Patient Re-evaluated:Patient Re-evaluated prior to inductionOxygen Delivery Method: Circle System Utilized Preoxygenation: Pre-oxygenation with 100% oxygen Intubation Type: IV induction Ventilation: Mask ventilation without difficulty Laryngoscope Size: Mac and 4 Grade View: Grade III Tube type: Oral Tube size: 8.0 mm Number of attempts: 1 Placement Confirmation: positive ETCO2 and breath sounds checked- equal and bilateral Secured at: 22 cm Tube secured with: Tape Comments: Intubation performed by Bailey Mech CRNA. Teeth and Oropharynx as preop except  1 small skin tear to lower oral mucousa.

## 2014-09-25 NOTE — Anesthesia Preprocedure Evaluation (Addendum)
Anesthesia Evaluation  Patient identified by MRN, date of birth, ID band Patient awake    Reviewed: Allergy & Precautions, NPO status , Patient's Chart, lab work & pertinent test results  History of Anesthesia Complications Negative for: history of anesthetic complications  Airway Mallampati: II  TM Distance: >3 FB Neck ROM: Full    Dental  (+) Dental Advisory Given, Poor Dentition   Pulmonary sleep apnea ,    Pulmonary exam normal       Cardiovascular hypertension,     Neuro/Psych PSYCHIATRIC DISORDERS Schizophrenia negative neurological ROS     GI/Hepatic negative GI ROS, Neg liver ROS,   Endo/Other    Renal/GU Renal disease     Musculoskeletal   Abdominal   Peds  Hematology   Anesthesia Other Findings   Reproductive/Obstetrics                            Anesthesia Physical Anesthesia Plan  ASA: III  Anesthesia Plan: General   Post-op Pain Management:    Induction: Intravenous  Airway Management Planned: Oral ETT  Additional Equipment:   Intra-op Plan:   Post-operative Plan: Extubation in OR  Informed Consent: I have reviewed the patients History and Physical, chart, labs and discussed the procedure including the risks, benefits and alternatives for the proposed anesthesia with the patient or authorized representative who has indicated his/her understanding and acceptance.   Dental advisory given  Plan Discussed with: CRNA, Anesthesiologist and Surgeon  Anesthesia Plan Comments:        Anesthesia Quick Evaluation

## 2014-09-25 NOTE — H&P (Signed)
[name] Branch  Location: [facility] Surgery Patient #: [id number] DOB: 1965/08/14 Single / Language: [name] / Race: Black or African American Male  History of Present Illness [name] MD; 08/17/2014 9:26 AM) Patient words: colectomy.  The patient is a 49 year old male who presents with a complaint of Constipation. Patient for surgical evaluation by Dr. Olevia Perches for concerns of severe constipation/colonic inertia Pleasant thin middle-aged male with no prior abdominal surgeries. History of some developmental delay and schizophrenia. He is here with his caretaker that has helped take care of him for 20 years. The patient has struggled with worsening constipation for most of his life. He has been on numerous laxatives, fiber bowel regimens and antispasmodics. Followed by gastroenterology. 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 He is required disimpactions and admissions for this. Never able to get a good bowel prep for some time. Came into the hospital in September 2015 with worsening abdominal pain. Past few CAT scan showed a giant colon full of stool progressively worsening. He is on MiraLAX, Metamucil, Dulcolax, milk of magnesia. He has been on Amitiza in the past. He's been on Lizness recently. Enemas less effectiveHe is drinking one bottle of magnesium citrate a day. He has 1 small bowel movement every day or so. Rarely has any large bulky well-formed stools. His weight has been stable. He is getting bilateral hydronephrosis. The large colon is getting bilateral ureteral compressions now. He was sent for surgical consultation to see if colectomy is warranted. No history of colon polyps. No history of Crohn's or inflammatory bowel disease. No history of irritable bowel syndrome. Can walk a couple miles without difficulty. No history of exertional chest pain or shortness of breath. No history of abdominal surgeries. Had a small abscess  lanced in his groin years ago. No history of hernia surgery. His caretaker does smoke at home.   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 Luvox CR (100MG  Capsule ER 24HR, Oral) Active. Lubiprostone (24MCG Capsule, Oral) Active. Magnesium Citrate (1.745GM/30ML Solution, Oral) Active. Multivitamins (Oral) Active. Ensure (Oral) Active. Pravastatin Sodium (40MG  Tablet, Oral) Active. Psyllium (58.6% Packet, Oral) Active. Travatan Z (0.004% Solution, Ophthalmic) Active. Milk of Magnesia (Oral) Active.  Social History [name], CMA; 08/17/2014 8:23 AM) Caffeine use Carbonated beverages, Coffee, Tea. No alcohol use No drug use Tobacco use Never smoker.  Family History [name], [place]; 08/17/2014 8:23 AM) Family history unknown First Degree Relatives  Review of Systems [name] CMA; 08/17/2014 8:23 AM) General Not Present- Appetite Loss, Chills, Fatigue, Fever, Night Sweats, Weight Gain and Weight Loss. Skin Not Present- Change in Wart/Mole, Dryness, Hives, Jaundice, New Lesions, Non-Healing Wounds, Rash and Ulcer. HEENT Not Present- Earache, Hearing Loss, Hoarseness, Nose Bleed, Oral Ulcers, Ringing in the Ears, Seasonal Allergies, Sinus Pain, Sore Throat, Visual Disturbances, Wears glasses/contact lenses and Yellow  Eyes. Breast Not Present- Breast Mass, Breast Pain, Nipple Discharge and Skin Changes. Cardiovascular Not Present- Chest Pain, Difficulty  Breathing Lying Down, Leg Cramps, Palpitations, Rapid Heart Rate, Shortness of Breath and Swelling of Extremities. Gastrointestinal Present- Constipation. Not Present- Abdominal Pain, Bloating, Bloody Stool, Change in Bowel Habits, Chronic diarrhea, Difficulty Swallowing, Excessive gas, Gets full quickly at meals, Hemorrhoids, Indigestion, Nausea, Rectal Pain and Vomiting. Male Genitourinary Not Present- Blood in Urine, Change in Urinary Stream, Frequency, Impotence, Nocturia, Painful Urination, Urgency and Urine Leakage. Neurological Not Present- Decreased Memory, Fainting, Headaches, Numbness, Seizures, Tingling, Tremor, Trouble walking and Weakness. Psychiatric Not Present- Anxiety, Bipolar, Change in Sleep Pattern, Depression, Fearful and Frequent crying. Endocrine Not Present- Cold Intolerance, Excessive Hunger, Hair Changes, Heat Intolerance, Hot flashes and New Diabetes. Hematology Not Present- Easy Bruising, Excessive bleeding, Gland problems, HIV and Persistent Infections.   Vitals ([name] CMA; 08/17/2014 8:23 AM) 08/17/2014 8:23 AM Weight: 175 lb Height: 73in Body Surface Area: 2.02 m Body Mass Index: 23.09 kg/m Temp.: 98.56F(Temporal)  Pulse: 110 (Regular)  BP: 130/74 (Sitting, Left Arm, Standard)    Physical Exam [name] MD; 08/17/2014 9:11 AM) General Mental Status-Alert. General Appearance-Not in acute distress, Not Sickly. Orientation-Oriented X3. Hydration-Well hydrated. Voice-Normal.  Integumentary Global Assessment Upon inspection and palpation of skin surfaces of the - Axillae: non-tender, no inflammation or ulceration, no drainage. and Distribution of scalp and body hair is normal. General Characteristics Temperature - normal warmth is noted.  Head and Neck Head-normocephalic, atraumatic  with no lesions or palpable masses. Face Global Assessment - atraumatic, no absence of expression. Neck Global Assessment - no abnormal movements, no bruit auscultated on the right, no bruit auscultated on the left, no decreased range of motion, non-tender. Trachea-midline. Thyroid Gland Characteristics - non-tender.  Eye Eyeball - Left-Extraocular movements intact, No Nystagmus. Eyeball - Right-Extraocular movements intact, No Nystagmus. Cornea - Left-No Hazy. Cornea - Right-No Hazy. Sclera/Conjunctiva - Left-No scleral icterus, No Discharge. Sclera/Conjunctiva - Right-No scleral icterus, No Discharge. Pupil - Left-Direct reaction to light normal. Pupil - Right-Direct reaction to light normal.  ENMT Ears Pinna - Left - no drainage observed, no generalized tenderness observed. Right - no drainage observed, no generalized tenderness observed. Nose and Sinuses External Inspection of the Nose - no destructive lesion observed. Inspection of the nares - Left - quiet respiration. Right - quiet respiration. Mouth and Throat Lips - Upper Lip - no fissures observed, no pallor noted. Lower Lip - no fissures observed, no pallor noted. Nasopharynx - no discharge present. Oral Cavity/Oropharynx - Tongue - no dryness observed. Oral Mucosa - no cyanosis observed. Hypopharynx - no evidence of airway distress observed.  Chest and Lung Exam Inspection Movements - Normal and Symmetrical. Accessory muscles - No use of accessory muscles in breathing. Palpation Palpation of the chest reveals - Non-tender. Auscultation Breath sounds - Normal and Clear.  Cardiovascular Auscultation Rhythm - Regular. Murmurs & Other Heart Sounds - Auscultation of the heart reveals - No Murmurs and No Systolic Clicks.  Abdomen Inspection Inspection of the abdomen reveals - No Visible peristalsis and No Abnormal pulsations. Umbilicus - No Bleeding, No Urine drainage. Palpation/Percussion Palpation  and Percussion of the abdomen reveal - Soft, Non Tender, No Rebound tenderness, No Rigidity (guarding) and No Cutaneous hyperesthesia. Note: Then with some supraumbilical diastases. No umbilical hernias. Obvious fullness and left lower quadrant somewhat mobile consistent with giant left colon.   Male Genitourinary Sexual Maturity Tanner 5 - Adult hair pattern and Adult penile size and shape.  Rectal Note: Normal external skin. No hemorrhoids. Normal sphincter tone. Rectum somewhat dilated but functional. No prolapse.  Good squeeze. No sphincter defects. No fissure. No fistula. No evidence of any discoordination defecation. At tip of finger a large stool ball felt.   Peripheral Vascular Upper Extremity Inspection - Left - No Cyanotic nailbeds, Not Ischemic. Right - No Cyanotic nailbeds, Not Ischemic.  Neurologic Neurologic evaluation reveals -normal attention span and ability to concentrate, able to name objects and repeat phrases. Appropriate fund of knowledge , normal sensation and normal coordination. Mental Status Affect - not angry, not paranoid. Cranial Nerves-Normal Bilaterally. Gait-Normal.  Neuropsychiatric Mental status exam performed with findings of-able to articulate well with normal speech/language, rate, volume and coherence, thought content normal with ability to perform basic computations and apply abstract reasoning and no evidence of hallucinations, delusions, obsessions or homicidal/suicidal ideation.  Musculoskeletal Global Assessment Spine, Ribs and Pelvis - no instability, subluxation or laxity. Right Upper Extremity - no instability, subluxation or laxity.  Lymphatic Head & Neck  General Head & Neck Lymphatics: Bilateral - Description - No Localized lymphadenopathy. Axillary  General Axillary Region: Bilateral - Description - No Localized lymphadenopathy. Femoral & Inguinal  Generalized Femoral & Inguinal Lymphatics: Left - Description - No  Localized lymphadenopathy. Right - Description - No Localized lymphadenopathy.    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 He is massively dilated colon with chronic stool in it. It is progressively worse despite his being on an aggressive regimen of fiber, laxatives, and antispasmodic medications. He's been followed closely by gastroenterology for almost a decade and is exhausted all options. He is had to be admitted and disimpacted in the past. That is not seen to be any strong evidence for difficulty with defecation or other such problems. He does not change the need to remove giant colon that has caused ulcers and hydronephrosis.  I think he would benefit from colonic resection. Discussed with my colorectal partner, Dr. Marcello Moores. This point I would recommend abdominal colectomy with ileorectal anastomosis. Hopefully minimize the chance of ileostomy.  He does have risks but he is only middle-aged without prior abdominal surgery, no history of prior cardiopulmonary issues, and does not smoke. Good activity level. He and his caretaker wish to be aggressive and proceed with surgery.  Since his colon is so giant, we'll do open approach since we'll need large incision to get it out. Current Plans  Schedule for Surgery The anatomy & physiology of the digestive tract was discussed. The pathophysiology of the colon was discussed. Natural history risks without surgery was discussed. I feel the risks of no intervention will lead to serious problems that outweigh the operative risks; therefore, I recommended a partial colectomy to remove the pathology. Minimally invasive (Robotic/Laparoscopic) & open techniques were discussed.  Risks such as bleeding, infection, abscess, leak, reoperation, possible ostomy, hernia, heart attack, death, and other risks were discussed. I noted a good likelihood this will help address the problem. Goals  of post-operative recovery were discussed as well. Need for adequate nutrition, daily bowel regimen and healthy physical activity, to optimize recovery was noted as well. We will work to minimize complications. Educational materials were available as well. Questions were answered. The patient expresses understanding & wishes to proceed with surgery. Pt Education - [facility] Colon Bowel Prep 2015 Miralax/Antibiotics Started Neomycin Sulfate 500MG , 2 (two) Tablet SEE NOTE, #6, 08/17/2014, No Refill. Local Order: TAKE TWO TABLETS AT 2 PM, 3 PM, AND 10 PM THE DAY PRIOR TO SURGERY Started Flagyl 500MG , 2 (two) Tablet SEE  NOTE, #6, 08/17/2014, No Refill. Local Order: Take at 2pm, 3pm, and 10pm the day prior to your colon operation Pt Education - [place] (Neko Mcgeehan) Pt Education - [facility] Abdominal Surgery (Shefali Ng) Pt Education - [facility] Pelvic Floor Exercises (Kegels) and Dysfunction HCI (Bradi Arbuthnot)   Signed by [name], MD  [name], M.D., F.A.C.S. Gastrointestinal and Minimally Invasive Surgery [facility] [address] [place], [place] [address] [phone] Main / Paging

## 2014-09-25 NOTE — Progress Notes (Signed)
Moaning and groaning. Nods head yes when asked if hurting. Meds given. Rubbing abd, getting hands tangeed in [place]. Tried to orient to postop status.

## 2014-09-25 NOTE — Op Note (Signed)
09/25/2014  2:21 PM  PATIENT:  [name] Branch  49 y.o. male  Patient Care Team: [name], MD as PCP - General (Internal Medicine) Lafayette Dragon, MD as Consulting Physician (Gastroenterology)  PRE-OPERATIVE DIAGNOSIS:  Severe Colonic Inertia  POST-OPERATIVE DIAGNOSIS:  Severe Colonic Inertia  PROCEDURE:  Procedure(s): OPEN ABDOMINAL COLECTOMY RIGID PROCTOSCOPY  SURGEON:  Surgeon(s): Michael Boston, MD Leighton Ruff, MD - ASSISTANT:   ANESTHESIA:   local and general  EBL:  Total I/O In: 4656 [I.V.:3000; IV Piggyback:750] Out: 812 [Urine:585]  Delay start of Pharmacological VTE agent (>24hrs) due to surgical blood loss or risk of bleeding:  no  DRAINS: (19Fr) Blake drain(s) in the pelvis   SPECIMEN:  Source of Specimen:  ABDOMINAL COLON & PROXIMAL RECTUM  DISPOSITION OF SPECIMEN:  PATHOLOGY  COUNTS:  YES  PLAN OF CARE: Admit to inpatient   PATIENT DISPOSITION:  PACU - hemodynamically stable.  INDICATION:    Pleasant gentleman with developmental day delay and chronic constipation.  Severely dysfunctional with worsening function despite aggressive bowel and laxative regimen and closed followed by gastroenterology.  Recent CAT scan showed massively dilated colon.  Recommendation made for colectomy for colonic inertia.  I recommended segmental resection:  The anatomy & physiology of the digestive tract was discussed.  The pathophysiology was discussed.  Natural history risks without surgery was discussed.   I worked to give an overview of the disease and the frequent need to have multispecialty involvement.  I feel the risks of no intervention will lead to serious problems that outweigh the operative risks; therefore, I recommended an abdominal colectomy to remove the pathology.  Laparoscopic & open techniques were discussed.   Risks such as bleeding, infection, abscess, leak, reoperation, possible ostomy, hernia, heart attack, death, and other risks were discussed.  I  noted a good likelihood this will help address the problem.   Goals of post-operative recovery were discussed as well.  We will work to minimize complications.  An educational handout on the pathology was given as well.  Questions were answered.    The patient's mother expresses understanding & wishes to proceed with surgery.  OR FINDINGS:   Patient had massively dilated left colon to the mid transverse colon.  Proximal colon last dilated.  Some moderate adhesions of small bowel to the very dilated mesial colon on the left side.  No obvious metastatic disease on visceral parietal peritoneum or liver.  It is a 29 EEA stapled ileorectal anastomosis that rests 12 cm from the anal verge  DESCRIPTION:   Informed consent was confirmed.  The patient underwent general anaesthesia without difficulty.  The patient was positioned with arms tucked & secured appropriately.  VTE prevention in place.  The patient's abdomen was clipped, prepped, & draped in a sterile fashion.  Surgical timeout confirmed our plan.  Because of the massively dilated colon, felt open approach would be most expeditious.  Proceeded through a area umbilical midline incision.  Found a massively dilated left-sided: Very dilated and redundant sigmoid colon.  No true volvulus.  Proximal rectum was very dilated as well.  We carefully freed the mesocolon and mesorectum off the rectosigmoid junction.  We stayed high to stay away from the ureters and retroperitoneal structures.  We got into the presacral plane and freed the rectum off its attachments down to the xiphoid for good posterior rectal mobilization.  We came around anteriorly and freed around the peritoneal reflection to help straighten out the rectum as well.  We transected  the rectum using a TA 90 stapler since it was massively dilated.  Took the mesocolon and mesorectum using LigaSure bipolar as well as intermittent clamps and 0 silk ties.  He had very dilated veins in his  mesocolon.  We transected the inferior mesenteric artery and vein after skeletonizing it.  Freed off the ligament of Treitz.  Proximal jejunum was somewhat redundant and adherent to the descending and distal transverse colon.  Stayed several centimeters off the aorta.  We then transitioned over to transecting the colon off the mesocolon up the descending colon up towards the splenic flexure.  We then focused on the right side and freed the terminal ileal mesentery and cecum and ascending colon and a lateral to medial fashion coming up to the hepatic flexure.  Both hepatic and splenic flexures were high riding.  The proximal jejunum and terminal ileum were densely adherent to the transverse colon and hepatic flexure.  We carefully freed those off to get a better definition of the mesocolon  We therefore transitioned to get into the lesser sac by freeing greater omentum off the mid transverse colon .  We followed that more distally towards the splenic flexure.  We alternated between mobilization of the proximal descending colon and distal transverse colon to get around the splenic flexure.  Freed the greater omentum off it.  Mobilized the colon off the retroperitoneum especially the left kidney and pancreas.  Then transected the mesocolon staying high on the colon.  With that we did come back to the mid transverse colon around to the middle colic pedicles.  We took those with clamps as ties as well.  0 silk.  Continued proximally to the hepatic flexure.  Eventually alternate between the distal ascending colon and proximal transverse colon and alternated dissecting in a lateral to medial and superior to inferior fashion.  Eventually freed layers of greater omentum and retroperitoneum off the hepatic flexure and a similar fashion as done at the splenic flexure.  Eventually was able to mobilize off the right kidney & duodenal sweep as well and transect the mesocolon.  The ileal mesentery easily reached down into  the mid pelvis.  Made an opening at the terminal ileum and placed a 29 EEA stapler.  Milked it proximally a few centimeters and exited it with an attached removable spike out the antimesenteric side of the distal ileum..  Then transected the terminal ileum just proximal to that enterotomy, 2.5cm from the edge of the anvil.  Specimen sent off.  We did reinspection of the abdomen.  Hemostasis was good.   Ureters, retroperitoneum, and bowel uninjured.  Dr. Marcello Moores scrubbed down.  Proceeded with rigid proctoscopy to clean up the rectal stump.  Brought the anvil off the anterior rectal wall a few centimeters distal to the staple line.  I attached the anvil of the distal ileum to the spike of the rectal stapler.  We brought that down.  Held for 60 seconds.  Fired.  Shield clamped for 30 seconds.  Released the stapler.  Found to excellent anastomotic rings.  We did gain air leak test with her doing insufflation by rigid proctoscopy.  Anastomosis noted at 41 7 m from the anal verge.  No leak of gas.  Had done irrigation of antibiotic solution (900 mg clindamycin/240 mg gentamicin in a liter of crystalloid) for the leak test.   We held that for 10 minutes while we hanged gown & gloves. The patient was re-draped.  Sterile unused instruments were used from  this point out per colon SSI prevention protocol.       We reinspected the abdomen.  Hemostasis was good.   No injury.  The anastomosis looked healthy.   The terminal ileal mesentery laid straight.  Ran the small bowel and allowed it to fall and from distal to proximal.  Closed a few small defects in the greater omentum.  Allowed that to reach down into the pelvis to protect it.  Placed a drain is noted above.  Saw no active bleeding in the abdomen and all cords and pelvis.  No ischemia or torsion to the small bowel.   I placed On-Q catheter and sheaths into the preperitoneal space under direct palpation.   I closed the midline incision using #1 PDS running closure. I  closed the skin with some interrupted Monocryl stitches. I placed antibiotic-soaked wicks in between those areas. I placed sterile dressing.  OnQ catheters placed & sheaths peeled away.  Patient is being extubated go to recovery room. I discussed postop care with the patient in detail the office & in the holding area. Instructions are written. I updated the patient's status to the patient's mother.  Recommendations were made.  Questions were answered.  She expressed understanding & appreciation.  [name], M.D., F.A.C.S. Gastrointestinal and Minimally Invasive Surgery [facility] [address] [place], [place] [id number] [phone] Main / Paging

## 2014-09-25 NOTE — Interval H&P Note (Signed)
History and Physical Interval Note:  09/25/2014 10:21 AM  [name] Branch  has presented today for surgery, with the diagnosis of Severe Colonic Inertia  The various methods of treatment have been discussed with the patient and family. After consideration of risks, benefits and other options for treatment, the patient has consented to  Procedure(s): OPEN ABDOMINAL COLECTOMY, RIGID PROCTOSCOPY (N/A) PROCTOSCOPY (N/A) as a surgical intervention .  The patient's history has been reviewed, patient examined, no change in status, stable for surgery.  I have reviewed the patient's chart and labs.  Questions were answered to the patient's satisfaction.     [name] Revere C.

## 2014-09-25 NOTE — Transfer of Care (Signed)
Immediate Anesthesia Transfer of Care Note  Patient: [name] Branch  Procedure(s) Performed: Procedure(s): OPEN ABDOMINAL COLECTOMY (N/A) RIGID PROCTOSCOPY (N/A)  Patient Location: PACU  Anesthesia Type:General  Level of Consciousness: Patient easily awoken, sedated, comfortable, cooperative, following commands, responds to stimulation.   Airway & Oxygen Therapy: Patient spontaneously breathing, ventilating well, oxygen via simple oxygen mask.  Post-op Assessment: Report given to PACU RN, vital signs reviewed and stable, moving all extremities.   Post vital signs: Reviewed and stable.  Complications: No apparent anesthesia complications

## 2014-09-25 NOTE — Anesthesia Postprocedure Evaluation (Signed)
Anesthesia Post Note  Patient: [name] Branch  Procedure(s) Performed: Procedure(s) (LRB): OPEN ABDOMINAL COLECTOMY (N/A) RIGID PROCTOSCOPY (N/A)  Anesthesia type: general  Patient location: PACU  Post pain: Pain level controlled  Post assessment: Patient's Cardiovascular Status Stable  Last Vitals:  Filed Vitals:   09/25/14 1530  BP: 114/57  Pulse: 88  Temp:   Resp: 17    Post vital signs: Reviewed and stable  Level of consciousness: sedated  Complications: No apparent anesthesia complications

## 2014-09-25 NOTE — Discharge Instructions (Signed)
ABDOMINAL SURGERY: POST OP INSTRUCTIONS  1. DIET: Follow a light bland diet the first 24 hours after arrival home, such as soup, liquids, crackers, etc.  Be sure to include lots of fluids daily.  Avoid fast food or heavy meals as your are more likely to get nauseated.  Eat a low fat the next few days after surgery.   2. Take your usually prescribed home medications unless otherwise directed. 3. PAIN CONTROL: a. Pain is best controlled by a usual combination of three different methods TOGETHER: i. Ice/Heat ii. Over the counter pain medication iii. Prescription pain medication b. Most patients will experience some swelling and bruising around the incisions.  Ice packs or heating pads (30-60 minutes up to 6 times a day) will help. Use ice for the first few days to help decrease swelling and bruising, then switch to heat to help relax tight/sore spots and speed recovery.  Some people prefer to use ice alone, heat alone, alternating between ice & heat.  Experiment to what works for you.  Swelling and bruising can take several weeks to resolve.   c. It is helpful to take an over-the-counter pain medication regularly for the first few weeks.  Choose one of the following that works best for you: i. Naproxen (Aleve, etc)  Two 228m tabs twice a day ii. Ibuprofen (Advil, etc) Three 2071mtabs four times a day (every meal & bedtime) iii. Acetaminophen (Tylenol, etc) 500-65061mour times a day (every meal & bedtime) d. A  prescription for pain medication (such as oxycodone, hydrocodone, etc) should be given to you upon discharge.  Take your pain medication as prescribed.  i. If you are having problems/concerns with the prescription medicine (does not control pain, nausea, vomiting, rash, itching, etc), please call us [phone] see if we need to switch you to a different pain medicine that will work better for you and/or control your side effect better. ii. If you need a refill on your pain medication,  please contact your pharmacy.  They will contact our office to request authorization. Prescriptions will not be filled after 5 pm or on week-ends. 4. Avoid getting constipated.  Between the surgery and the pain medications, it is common to experience some constipation.  Increasing fluid intake and taking a fiber supplement (such as Metamucil, Citrucel, FiberCon, MiraLax, etc) 1-2 times a day regularly will usually help prevent this problem from occurring.  A mild laxative (prune juice, Milk of Magnesia, MiraLax, etc) should be taken according to package directions if there are no bowel movements after 48 hours.   5. Watch out for diarrhea.  If you have many loose bowel movements, simplify your diet to bland foods & liquids for a few days.  Stop any stool softeners and decrease your fiber supplement.  Switching to mild anti-diarrheal medications (Kayopectate, Pepto Bismol) can help.  If this worsens or does not improve, please call us.[place]. Wash / shower every day.  You may shower over the incision / wound.  Avoid baths until the skin is fully healed.  Continue to shower over incision(s) after the dressing is off. 7. Remove your waterproof bandages 5 days after surgery.  You may leave the incision open to air.  Remove any wicks or ribbons in your wound.  If you have an open wound, please see wound care instructions. You may replace a dressing/Band-Aid to cover the incision for comfort if you wish. 8. ACTIVITIES as tolerated:   a. You may resume regular (light)  daily activities beginning the next day--such as daily self-care, walking, climbing stairs--gradually increasing activities as tolerated.  If you can walk 30 minutes without difficulty, it is safe to try more intense activity such as jogging, treadmill, bicycling, low-impact aerobics, swimming, etc. b. Save the most intensive and strenuous activity for last such as sit-ups, heavy lifting, contact sports, etc  Refrain from any heavy lifting or straining  until you are off narcotics for pain control.   c. DO NOT PUSH THROUGH PAIN.  Let pain be your guide: If it hurts to do something, don't do it.  Pain is your body warning you to avoid that activity for another week until the pain goes down. d. You may drive when you are no longer taking prescription pain medication, you can comfortably wear a seatbelt, and you can safely maneuver your car and apply brakes. e. Dennis Bast may have sexual intercourse when it is comfortable.  9. FOLLOW UP in our office a. Please call [facility] at [phone] to set up an appointment to see your surgeon in the office for a follow-up appointment approximately 1-2 weeks after your surgery. b. Make sure that you call for this appointment the day you arrive home to insure a convenient appointment time. 10. IF YOU HAVE DISABILITY OR FAMILY LEAVE FORMS, BRING THEM TO THE OFFICE FOR PROCESSING.  DO NOT GIVE THEM TO YOUR DOCTOR.   WHEN TO CALL us [phone]: 1. Poor pain control 2. Reactions / problems with new medications (rash/itching, nausea, etc)  3. Fever over 101.5 F (38.5 C) 4. Inability to urinate 5. Nausea and/or vomiting 6. Worsening swelling or bruising 7. Continued bleeding from incision. 8. Increased pain, redness, or drainage from the incision  The clinic staff is available to answer your questions during regular business hours (8:30am-5pm).  Please dont hesitate to call and ask to speak to one of our nurses for clinical concerns.   A surgeon from [facility] is always on call at the hospitals   If you have a medical emergency, go to the nearest emergency room or call 911.    [facility], [place], [place], [place], [place]  [address] ? MAIN: [phone] ? TOLL FREE: [phone] ? FAX [phone] [email or web address]  GETTING TO GOOD BOWEL HEALTH. Irregular bowel habits such as constipation and diarrhea can lead to many problems over time.   Having one soft bowel movement a day is the most important way to prevent further problems.  The anorectal canal is designed to handle stretching and feces to safely manage our ability to get rid of solid waste (feces, poop, stool) out of our body.  BUT, hard constipated stools can act like ripping concrete bricks and diarrhea can be a burning fire to this very sensitive area of our body, causing inflamed hemorrhoids, anal fissures, increasing risk is perirectal abscesses, abdominal pain/bloating, an making irritable bowel worse.     The goal: ONE SOFT BOWEL MOVEMENT A DAY!  To have soft, regular bowel movements:   Drink at least 8 tall glasses of water a day.    Take plenty of fiber.  Fiber is the undigested part of plant food that passes into the colon, acting s natures broom to encourage bowel motility and movement.  Fiber can absorb and hold large amounts of water. This results in a larger, bulkier stool, which is soft and easier to pass. Work gradually over several weeks up to 6 servings a day of fiber (  25g a day even more if needed) in the form of: o Vegetables -- Root (potatoes, carrots, turnips), leafy green (lettuce, salad greens, celery, spinach), or cooked high residue (cabbage, broccoli, etc) o Fruit -- Fresh (unpeeled skin & pulp), Dried (prunes, apricots, cherries, etc ),  or stewed ( applesauce)  o Whole grain breads, pasta, etc (whole wheat)  o Bran cereals   Bulking Agents -- This type of water-retaining fiber generally is easily obtained each day by one of the following:  o Psyllium bran -- The psyllium plant is remarkable because its ground seeds can retain so much water. This product is available as Metamucil, Konsyl, Effersyllium, Per Diem Fiber, or the less expensive generic preparation in drug and health food stores. Although labeled a laxative, it really is not a laxative.  o Methylcellulose -- This is another fiber derived from wood which also retains water. It is available as  Citrucel. o Polyethylene Glycol - and artificial fiber commonly called Miralax or Glycolax.  It is helpful for people with gassy or bloated feelings with regular fiber o Flax Seed - a less gassy fiber than psyllium  No reading or other relaxing activity while on the toilet. If bowel movements take longer than 5 minutes, you are too constipated  AVOID CONSTIPATION.  High fiber and water intake usually takes care of this.  Sometimes a laxative is needed to stimulate more frequent bowel movements, but   Laxatives are not a good long-term solution as it can wear the colon out. o Osmotics (Milk of Magnesia, Fleets phosphosoda, Magnesium citrate, MiraLax, GoLytely) are safer than  o Stimulants (Senokot, Castor Oil, Dulcolax, Ex Lax)    o Do not take laxatives for more than 7days in a row.   IF SEVERELY CONSTIPATED, try a Bowel Retraining Program: o Do not use laxatives.  o Eat a diet high in roughage, such as bran cereals and leafy vegetables.  o Drink six (6) ounces of prune or apricot juice each morning.  o Eat two (2) large servings of stewed fruit each day.  o Take one (1) heaping tablespoon of a psyllium-based bulking agent twice a day. Use sugar-free sweetener when possible to avoid excessive calories.  o Eat a normal breakfast.  o Set aside 15 minutes after breakfast to sit on the toilet, but do not strain to have a bowel movement.  o If you do not have a bowel movement by the third day, use an enema and repeat the above steps.   Controlling diarrhea o Switch to liquids and simpler foods for a few days to avoid stressing your intestines further. o Avoid dairy products (especially milk & ice cream) for a short time.  The intestines often can lose the ability to digest lactose when stressed. o Avoid foods that cause gassiness or bloating.  Typical foods include beans and other legumes, cabbage, broccoli, and dairy foods.  Every person has some sensitivity to other foods, so listen to our  body and avoid those foods that trigger problems for you. o Adding fiber (Citrucel, Metamucil, psyllium, Miralax) gradually can help thicken stools by absorbing excess fluid and retrain the intestines to act more normally.  Slowly increase the dose over a few weeks.  Too much fiber too soon can backfire and cause cramping & bloating. o Probiotics (such as active yogurt, Align, etc) may help repopulate the intestines and colon with normal bacteria and calm down a sensitive digestive tract.  Most studies show it to be of mild help,  though, and such products can be costly. o Medicines: - Bismuth subsalicylate (ex. Kayopectate, Pepto Bismol) every 30 minutes for up to 6 doses can help control diarrhea.  Avoid if pregnant. - Loperamide (Immodium) can slow down diarrhea.  Start with two tablets (4mg  total) first and then try one tablet every 6 hours.  Avoid if you are having fevers or severe pain.  If you are not better or start feeling worse, stop all medicines and call your doctor for advice o Call your doctor if you are getting worse or not better.  Sometimes further testing (cultures, endoscopy, X-ray studies, bloodwork, etc) may be needed to help diagnose and treat the cause of the diarrhea.  Managing Pain  Pain after surgery or related to activity is often due to strain/injury to muscle, tendon, nerves and/or incisions.  This pain is usually short-term and will improve in a few months.   Many people find it helpful to do the following things TOGETHER to help speed the process of healing and to get back to regular activity more quickly:  1. Avoid heavy physical activity a.  no lifting greater than 20 pounds b. Do not push through the pain.  Listen to your body and avoid positions and maneuvers than reproduce the pain c. Walking is okay as tolerated, but go slowly and stop when getting sore.  d. Remember: If it hurts to do it, then dont do it! 2. Take Anti-inflammatory medication  a. Take with  food/snack around the clock for 1-2 weeks i. This helps the muscle and nerve tissues become less irritable and calm down faster b. Choose ONE of the following over-the-counter medications: i. Naproxen 220mg  tabs (ex. Aleve) 1-2 pills twice a day  ii. Ibuprofen 200mg  tabs (ex. Advil, Motrin) 3-4 pills with every meal and just before bedtime iii. Acetaminophen 500mg  tabs (Tylenol) 1-2 pills with every meal and just before bedtime 3. Use a Heating pad or Ice/Cold Pack a. 4-6 times a day b. May use warm bath/hottub  or showers 4. Try Gentle Massage and/or Stretching  a. at the area of pain many times a day b. stop if you feel pain - do not overdo it  Try these steps together to help you body heal faster and avoid making things get worse.  Doing just one of these things may not be enough.    If you are not getting better after two weeks or are noticing you are getting worse, contact our office for further advice; we may need to re-evaluate you & see what other things we can do to help.  Pelvic floor muscle training exercises ("Kegels") can help strengthen the muscles under the uterus, bladder, and bowel (large intestine). They can help both men and women who have problems with urine leakage or bowel control.  A pelvic floor muscle training exercise is like pretending that you have to urinate, and then holding it. You relax and tighten the muscles that control urine flow. It's important to find the right muscles to tighten.  The next time you have to urinate, start to go and then stop. Feel the muscles in your vagina, bladder, or anus get tight and move up. These are the pelvic floor muscles. If you feel them tighten, you've done the exercise right. If you are still not sure whether you are tightening the right muscles, keep in mind that all of the muscles of the pelvic floor relax and contract at the same time. Because these muscles control the bladder,  rectum, and vagina, the following tips may  help: Women: Insert a finger into your vagina. Tighten the muscles as if you are holding in your urine, then let go. You should feel the muscles tighten and move up and down.  Men: Insert a finger into your rectum. Tighten the muscles as if you are holding in your urine, then let go. You should feel the muscles tighten and move up and down. These are the same muscles you would tighten if you were trying to prevent yourself from passing gas.  It is very important that you keep the following muscles relaxed while doing pelvic floor muscle training exercises: Abdominal  Buttocks (the deeper, anal sphincter muscle should contract)  Thigh   A woman can also strengthen these muscles by using a vaginal cone, which is a weighted device that is inserted into the vagina. Then you try to tighten the pelvic floor muscles to hold the device in place. If you are unsure whether you are doing the pelvic floor muscle training correctly, you can use biofeedback and electrical stimulation to help find the correct muscle group to work. Biofeedback is a method of positive reinforcement. Electrodes are placed on the abdomen and along the anal area. Some therapists place a sensor in the vagina in women or anus in men to monitor the contraction of pelvic floor muscles.  A monitor will display a graph showing which muscles are contracting and which are at rest. The therapist can help find the right muscles for performing pelvic floor muscle training exercises.   PERFORMING PELVIC FLOOR EXERCISES: 1. Begin by emptying your bladder. 2. Tighten the pelvic floor muscles and hold for a count of 10. 3. Relax the muscles completely for a count of 10. 4. Do 10 repititions, 3 to 5 times a day (morning, afternoon, and night). You can do these exercises at any time and any place. Most people prefer to do the exercises while lying down or sitting in a chair. After 4 - 6 weeks, most people notice some improvement. It may take as long  as 3 months to see a major change. After a couple of weeks, you can also try doing a single pelvic floor contraction at times when you are likely to leak (for example, while getting out of a chair). A word of caution: Some people feel that they can speed up the progress by increasing the number of repetitions and the frequency of exercises. However, over-exercising can instead cause muscle fatigue and increase urine leakage. If you feel any discomfort in your abdomen or back while doing these exercises, you are probably doing them wrong. Breathe deeply and relax your body when you are doing these exercises. Make sure you are not tightening your stomach, thigh, buttock, or chest muscles. When done the right way, pelvic floor muscle exercises have been shown to be very effective at improving urinary continence.  Pelvic Floor Pain / Incontinence  Do you suffer from pelvic pain or incontinence? Do you have pain in the pelvis, low back or hips that is associated with sitting, walking, urination or intercourse? Have you experienced leaking of urine or feces when coughing, sneezing or laughing? Do you have pain in the pelvic area associated with cancer?  These are conditions that are common with pelvic floor muscle dysfunction. Over time, due to stress, scar tissue, surgeries and the natural course of aging, our muscles may become weak or overstressed and can spasm. This can lead to pain, weakness, incontinence or decreased  quality of life.  Men and women with pelvic floor dysfunction frequently describe:  A falling out feeling. Pain or burning in the abdomen, tailbone or perineal area. Constipation or bowel elimination problems or difficulty initiating urination. Unresolved low back or hip pain. Frequency and urgency when going to the bathroom. Leaking of urine or feces. Pain with  intercourse.  [email or web address]  To make a referral or for more information about [facility] Pelvic Floor Therapy Program, call  [facility]) - [place] [phone] [place]) - [facility]) - [phone]

## 2014-09-26 LAB — CBC
HEMATOCRIT: 27.4 % — AB (ref 39.0–52.0)
Hemoglobin: 9.1 g/dL — ABNORMAL LOW (ref 13.0–17.0)
MCH: 29.7 pg (ref 26.0–34.0)
MCHC: 33.2 g/dL (ref 30.0–36.0)
MCV: 89.5 fL (ref 78.0–100.0)
Platelets: 123 10*3/uL — ABNORMAL LOW (ref 150–400)
RBC: 3.06 MIL/uL — ABNORMAL LOW (ref 4.22–5.81)
RDW: 14.8 % (ref 11.5–15.5)
WBC: 10.7 10*3/uL — AB (ref 4.0–10.5)

## 2014-09-26 LAB — BASIC METABOLIC PANEL
Anion gap: 5 (ref 5–15)
BUN: 12 mg/dL (ref 6–23)
CO2: 26 mmol/L (ref 19–32)
Calcium: 8.3 mg/dL — ABNORMAL LOW (ref 8.4–10.5)
Chloride: 112 mmol/L (ref 96–112)
Creatinine, Ser: 0.92 mg/dL (ref 0.50–1.35)
GFR calc Af Amer: >90 mL/min (ref >90)
Glucose, Bld: 102 mg/dL — ABNORMAL HIGH (ref 70–99)
Potassium: 4.3 mmol/L (ref 3.5–5.1)
SODIUM: 143 mmol/L (ref 135–145)

## 2014-09-26 LAB — MAGNESIUM: Magnesium: 1.6 mg/dL (ref 1.5–2.5)

## 2014-09-26 MED ORDER — SODIUM CHLORIDE 0.9 % IJ SOLN
3.0000 mL | Freq: Two times a day (BID) | INTRAMUSCULAR | Status: DC
  Administered 2014-09-26 – 2014-09-28 (×3): 3 mL via INTRAVENOUS

## 2014-09-26 MED ORDER — MAGNESIUM SULFATE 2 GM/50ML IV SOLN
2.0000 g | Freq: Once | INTRAVENOUS | Status: AC
  Administered 2014-09-26: 2 g via INTRAVENOUS
  Filled 2014-09-26: qty 50

## 2014-09-26 MED ORDER — SODIUM CHLORIDE 0.9 % IV SOLN: 250.0000 mL | INTRAVENOUS | Status: DC | PRN

## 2014-09-26 MED ORDER — SODIUM CHLORIDE 0.9 % IJ SOLN
3.0000 mL | INTRAMUSCULAR | Status: DC | PRN
  Administered 2014-09-27: 3 mL via INTRAVENOUS
  Filled 2014-09-26: qty 3

## 2014-09-26 MED ORDER — LACTATED RINGERS IV BOLUS (SEPSIS): 1000.0000 mL | Freq: Three times a day (TID) | INTRAVENOUS | Status: AC | PRN

## 2014-09-26 NOTE — Progress Notes (Signed)
[facility]  [address] [place], [place] [address] Phone: [phone] FAX: [phone]    [name] Branch [id number] 06/29/1966  CARE TEAM:  PCP: Gilles Chiquito, MD  Outpatient Care Team: Patient Care Team: [name], MD as PCP - General (Internal Medicine) Lafayette Dragon, MD as Consulting Physician (Gastroenterology)  Inpatient Treatment Team: Treatment Team: Attending Provider: Michael Boston, MD; Licensed Practical Nurse: Rolanda Lundborg Dildy, LPN   Subjective:  Pain controlled Mother at bedside Tol liquids  Objective:  Vital signs:  Filed Vitals:   09/25/14 1734 09/25/14 2037 09/26/14 0027 09/26/14 0418  BP: 106/54 112/51 113/55 96/61  Pulse: 101 108 88 98  Temp: 98.2 F (36.8 C) 98.7 F (37.1 C) 98.2 F (36.8 C) 98.6 F (37 C)  TempSrc:  Oral Oral Oral  Resp: 16 18 18 18   Height:      Weight:      SpO2: 100% 98% 100% 100%       Intake/Output   Yesterday:  02/26 0701 - 02/27 0700 In: 4871.3 [I.V.:3871.3; IV Piggyback:1000] Out: 2390 [VOJJK:0938; Drains:405; Blood:200] This shift:     Bowel function:  Flatus: n  BM: n  Drain: serosanguinous  Physical Exam:  General: Pt awake/alert/oriented x4 in no acute distress Eyes: PERRL, normal EOM.  Sclera clear.  No icterus Neuro: CN II-XII intact w/o focal sensory/motor deficits. Lymph: No head/neck/groin lymphadenopathy Psych:  No delerium/psychosis/paranoia HENT: Normocephalic, Mucus membranes moist.  No thrush Neck: Supple, No tracheal deviation Chest: No chest wall pain w good excursion CV:  Pulses intact.  Regular rhythm MS: Normal AROM mjr joints.  No obvious deformity Abdomen: Soft.  Nondistended.  Mildly tender at incisions only.  No evidence of peritonitis.  No incarcerated hernias. Ext:  SCDs BLE.  No mjr edema.  No cyanosis Skin: No petechiae / purpura   Problem List:   Principal Problem:   Colonic inertia s/p abdominal colectomy  09/25/2014 Active Problems:   Sarcoidosis   Schizophrenia, unspecified type   Mental retardation   Essential hypertension   Assessment  [name] Branch  49 y.o. male  1 Day Post-Op  Procedure(s): OPEN ABDOMINAL COLECTOMY RIGID PROCTOSCOPY  Recovering  Plan:  -adv diet slowly per protocol -wean IVF  -foley out tomorrow -f/u path -low Mag - replace IV -VTE prophylaxis- SCDs, etc -mobilize as tolerated to help recovery  I updated the status of the patient to the patient & patient's mother.  I made recommendations.  I answered questions.  Understanding & appreciation was expressed.   [name], M.D., F.A.C.S. Gastrointestinal and Minimally Invasive Surgery [facility] [address], [place], [place] [address] [phone] Main / Paging   09/26/2014   Results:   Labs: Results for orders placed or performed during the hospital encounter of 09/25/14 (from the past 48 hour(s))  Type and screen     Status: None   Collection Time: 09/25/14  9:15 AM  Result Value Ref Range   ABO/RH(D) A POS    Antibody Screen NEG    Sample Expiration 09/28/2014   ABO/Rh     Status: None   Collection Time: 09/25/14  9:15 AM  Result Value Ref Range   ABO/RH(D) A POS   CBC with Differential/Platelet     Status: Abnormal   Collection Time: 09/25/14  6:28 PM  Result Value Ref Range   WBC 9.2 4.0 - 10.5 K/uL   RBC 3.29 (L) 4.22 - 5.81 MIL/uL  Hemoglobin 9.6 (L) 13.0 - 17.0 g/dL   HCT 29.6 (L) 39.0 - 52.0 %   MCV 90.0 78.0 - 100.0 fL   MCH 29.2 26.0 - 34.0 pg   MCHC 32.4 30.0 - 36.0 g/dL   RDW 14.5 11.5 - 15.5 %   Platelets 132 (L) 150 - 400 K/uL   Neutrophils Relative % 88 (H) 43 - 77 %   Neutro Abs 8.2 (H) 1.7 - 7.7 K/uL   Lymphocytes Relative 2 (L) 12 - 46 %   Lymphs Abs 0.2 (L) 0.7 - 4.0 K/uL   Monocytes Relative 10 3 - 12 %   Monocytes Absolute 0.9 0.1 - 1.0 K/uL   Eosinophils Relative 0 0 - 5 %   Eosinophils Absolute 0.0 0.0 - 0.7  K/uL   Basophils Relative 0 0 - 1 %   Basophils Absolute 0.0 0.0 - 0.1 K/uL  CBC     Status: Abnormal   Collection Time: 09/26/14  5:29 AM  Result Value Ref Range   WBC 10.7 (H) 4.0 - 10.5 K/uL   RBC 3.06 (L) 4.22 - 5.81 MIL/uL   Hemoglobin 9.1 (L) 13.0 - 17.0 g/dL   HCT 27.4 (L) 39.0 - 52.0 %   MCV 89.5 78.0 - 100.0 fL   MCH 29.7 26.0 - 34.0 pg   MCHC 33.2 30.0 - 36.0 g/dL   RDW 14.8 11.5 - 15.5 %   Platelets 123 (L) 150 - 400 K/uL    Imaging / Studies: No results found.  Medications / Allergies: per chart  Antibiotics: Anti-infectives    Start     Dose/Rate Route Frequency Ordered Stop   09/26/14 0000  cefoTEtan (CEFOTAN) 2 g in dextrose 5 % 50 mL IVPB     2 g 100 mL/hr over 30 Minutes Intravenous Every 12 hours 09/25/14 1941 09/26/14 0038   09/25/14 1359  clindamycin (CLEOCIN) 900 mg, gentamicin (GARAMYCIN) 240 mg in sodium chloride 0.9 % 1,000 mL for intraperitoneal lavage  Status:  Discontinued       As needed 09/25/14 1359 09/25/14 1436   09/25/14 0857  cefoTEtan (CEFOTAN) 2 g in dextrose 5 % 50 mL IVPB     2 g 100 mL/hr over 30 Minutes Intravenous On call to O.R. 09/25/14 0857 09/25/14 1120   09/24/14 1345  clindamycin (CLEOCIN) 900 mg, gentamicin (GARAMYCIN) 240 mg in sodium chloride 0.9 % 1,000 mL for intraperitoneal lavage  Status:  Discontinued    Comments:  Pharmacy may adjust dosing strength, schedule, rate of infusion, etc as needed to optimize therapy    Intraperitoneal To Surgery 09/24/14 1341 09/25/14 1941       Note: Portions of this report may have been transcribed using voice recognition software. Every effort was made to ensure accuracy; however, inadvertent computerized transcription errors may be present.   Any transcriptional errors that result from this process are unintentional.

## 2014-09-27 NOTE — Progress Notes (Signed)
Foley catheter removed at approximately 0620. Patient tolerated procedure.

## 2014-09-27 NOTE — Progress Notes (Addendum)
[facility]  [address] [place], [place] [address] Phone: [phone] FAX: [phone]    [name] Branch [id number] Dec 02, 1965  CARE TEAM:  PCP: Gilles Chiquito, MD  Outpatient Care Team: Patient Care Team: [name], MD as PCP - General (Internal Medicine) Lafayette Dragon, MD as Consulting Physician (Gastroenterology)  Inpatient Treatment Team: Treatment Team: Attending Provider: Michael Boston, MD; Licensed Practical Nurse: Rolanda Lundborg Dildy, LPN; Registered Nurse: Jonn Shingles, RN; Registered Nurse: Charolette Child, RN; Registered Nurse: Bonnee Quin, RN   Subjective:  Denies pain Mother at bedside RN at bedside Tol liquids  Objective:  Vital signs:  Filed Vitals:   09/27/14 0012 09/27/14 0411 09/27/14 0558 09/27/14 0608  BP: 125/72 134/74  123/69  Pulse: 103 106  108  Temp: 98.1 F (36.7 C) 98.6 F (37 C)    TempSrc: Oral Oral    Resp: 18 18    Height:      Weight:   169 lb 12.1 oz (77 kg)   SpO2: 98% 99%      Last BM Date: 09/23/14  Intake/Output   Yesterday:  02/27 0701 - 02/28 0700 In: 2170 [P.O.:2170] Out: 3150 [Urine:2550; Drains:600] This shift:  Total I/O In: 1923 [P.O.:1920; I.V.:3] Out: -   Bowel function:  Flatus: n  BM: n  Drain: serosanguinous  Physical Exam:  General: Pt awake/alert/oriented x4 in no acute distress Eyes: PERRL, normal EOM.  Sclera clear.  No icterus Neuro: CN II-XII intact w/o focal sensory/motor deficits. Lymph: No head/neck/groin lymphadenopathy Psych:  No delerium/psychosis/paranoia HENT: Normocephalic, Mucus membranes moist.  No thrush Neck: Supple, No tracheal deviation Chest: No chest wall pain w good excursion CV:  Pulses intact.  Regular rhythm MS: Normal AROM mjr joints.  No obvious deformity Abdomen: Soft.  Nondistended.  Mildly tender at incisions only.  No evidence of peritonitis.  No incarcerated hernias. Ext:  SCDs BLE.  No mjr edema.   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 male  2 Days Post-Op  Procedure(s): OPEN ABDOMINAL COLECTOMY RIGID PROCTOSCOPY  Recovering  Plan:  -adv diet to fulls per protocol -OK off IVF  -foley out today -f/u path -MS stable - continue clozapine for schizophrenia -VTE prophylaxis- SCDs, etc -mobilize as tolerated to help recovery  I updated the status of the patient to the patient, RN & patient's mother.  I made recommendations.  I answered questions.  Understanding & appreciation was expressed.   [name], M.D., F.A.C.S. Gastrointestinal and Minimally Invasive Surgery [facility] [address], [place], [place] [address] [phone] Main / Paging   09/27/2014   Results:   Labs: Results for orders placed or performed during the hospital encounter of 09/25/14 (from the past 48 hour(s))  CBC with Differential/Platelet     Status: Abnormal   Collection Time: 09/25/14  6:28 PM  Result Value Ref Range   WBC 9.2 4.0 - 10.5 K/uL   RBC 3.29 (L) 4.22 - 5.81 MIL/uL   Hemoglobin 9.6 (L) 13.0 - 17.0 g/dL   HCT 29.6 (L) 39.0 - 52.0 %   MCV 90.0 78.0 - 100.0 fL   MCH 29.2 26.0 - 34.0 pg   MCHC  32.4 30.0 - 36.0 g/dL   RDW 14.5 11.5 - 15.5 %   Platelets 132 (L) 150 - 400 K/uL   Neutrophils Relative % 88 (H) 43 - 77 %   Neutro Abs 8.2 (H) 1.7 - 7.7 K/uL   Lymphocytes Relative 2 (L) 12 - 46 %   Lymphs Abs 0.2 (L) 0.7 - 4.0 K/uL   Monocytes Relative 10 3 - 12 %   Monocytes Absolute 0.9 0.1 - 1.0 K/uL   Eosinophils Relative 0 0 - 5 %   Eosinophils Absolute 0.0 0.0 - 0.7 K/uL   Basophils Relative 0 0 - 1 %   Basophils Absolute 0.0 0.0 - 0.1 K/uL  Basic metabolic panel     Status: Abnormal    Collection Time: 09/26/14  5:29 AM  Result Value Ref Range   Sodium 143 135 - 145 mmol/L   Potassium 4.3 3.5 - 5.1 mmol/L   Chloride 112 96 - 112 mmol/L   CO2 26 19 - 32 mmol/L   Glucose, Bld 102 (H) 70 - 99 mg/dL   BUN 12 6 - 23 mg/dL   Creatinine, Ser 0.92 0.50 - 1.35 mg/dL   Calcium 8.3 (L) 8.4 - 10.5 mg/dL   GFR calc non Af Amer >90 >90 mL/min   GFR calc Af Amer >90 >90 mL/min    Comment: (NOTE) The eGFR has been calculated using the CKD EPI equation. This calculation has not been validated in all clinical situations. eGFR's persistently <90 mL/min signify possible Chronic Kidney Disease.    Anion gap 5 5 - 15  CBC     Status: Abnormal   Collection Time: 09/26/14  5:29 AM  Result Value Ref Range   WBC 10.7 (H) 4.0 - 10.5 K/uL   RBC 3.06 (L) 4.22 - 5.81 MIL/uL   Hemoglobin 9.1 (L) 13.0 - 17.0 g/dL   HCT 27.4 (L) 39.0 - 52.0 %   MCV 89.5 78.0 - 100.0 fL   MCH 29.7 26.0 - 34.0 pg   MCHC 33.2 30.0 - 36.0 g/dL   RDW 14.8 11.5 - 15.5 %   Platelets 123 (L) 150 - 400 K/uL  Magnesium     Status: None   Collection Time: 09/26/14  5:29 AM  Result Value Ref Range   Magnesium 1.6 1.5 - 2.5 mg/dL    Imaging / Studies: No results found.  Medications / Allergies: per chart  Antibiotics: Anti-infectives    Start     Dose/Rate Route Frequency Ordered Stop   09/26/14 0000  cefoTEtan (CEFOTAN) 2 g in dextrose 5 % 50 mL IVPB     2 g 100 mL/hr over 30 Minutes Intravenous Every 12 hours 09/25/14 1941 09/26/14 0038   09/25/14 1359  clindamycin (CLEOCIN) 900 mg, gentamicin (GARAMYCIN) 240 mg in sodium chloride 0.9 % 1,000 mL for intraperitoneal lavage  Status:  Discontinued       As needed 09/25/14 1359 09/25/14 1436   09/25/14 0857  cefoTEtan (CEFOTAN) 2 g in dextrose 5 % 50 mL IVPB     2 g 100 mL/hr over 30 Minutes Intravenous On call to O.R. 09/25/14 0857 09/25/14 1120   09/24/14 1345  clindamycin (CLEOCIN) 900 mg, gentamicin (GARAMYCIN) 240 mg in sodium chloride 0.9 % 1,000 mL  for intraperitoneal lavage  Status:  Discontinued    Comments:  Pharmacy may adjust dosing strength, schedule, rate of infusion, etc as needed to optimize therapy    Intraperitoneal To Surgery 09/24/14 1341 09/25/14 1941  Note: Portions of this report may have been transcribed using voice recognition software. Every effort was made to ensure accuracy; however, inadvertent computerized transcription errors may be present.   Any transcriptional errors that result from this process are unintentional.

## 2014-09-27 NOTE — Progress Notes (Signed)
Utilization review completed.  

## 2014-09-28 ENCOUNTER — Encounter (HOSPITAL_COMMUNITY): Payer: Self-pay | Admitting: Surgery

## 2014-09-28 MED ORDER — LACTATED RINGERS IV BOLUS (SEPSIS): 1000.0000 mL | Freq: Three times a day (TID) | INTRAVENOUS | Status: AC | PRN

## 2014-09-28 MED ORDER — METOPROLOL TARTRATE 12.5 MG HALF TABLET
12.5000 mg | ORAL_TABLET | Freq: Two times a day (BID) | ORAL | Status: DC | PRN
  Filled 2014-09-28 (×2): qty 1

## 2014-09-28 MED ORDER — OXYCODONE HCL 5 MG PO TABS
5.0000 mg | ORAL_TABLET | ORAL | Status: DC | PRN
Start: 2014-09-28 — End: 2014-09-30

## 2014-09-28 MED ORDER — PSYLLIUM 95 % PO PACK
1.0000 | PACK | Freq: Every day | ORAL | Status: DC
  Administered 2014-09-28: 1 via ORAL
  Filled 2014-09-28 (×2): qty 1

## 2014-09-28 MED ORDER — METOPROLOL TARTRATE 12.5 MG HALF TABLET
12.5000 mg | ORAL_TABLET | Freq: Two times a day (BID) | ORAL | Status: DC
  Administered 2014-09-28 – 2014-09-30 (×5): 12.5 mg via ORAL
  Filled 2014-09-28 (×6): qty 1

## 2014-09-28 MED ORDER — LACTATED RINGERS IV BOLUS (SEPSIS): 1000.0000 mL | Freq: Three times a day (TID) | INTRAVENOUS | Status: DC | PRN

## 2014-09-28 NOTE — Progress Notes (Signed)
PT Cancellation Note  Patient Details Name: [name] Branch MRN: [id number] DOB: [birth year]   Cancelled Treatment:    Reason Eval/Treat Not Completed: Other (comment) (caregiver reports patient ambulated in hall today a long distance and plans again, going to BR also with  min guard. Check back in AM to assist with ambulation as needed.)   Claretha Cooper 09/28/2014, 3:02 PM Tresa Endo PT [phone]

## 2014-09-28 NOTE — Progress Notes (Signed)
[name] Branch., [place], [place] [address] Phone: [phone] FAX: [phone]    [name] Branch [id number] [birth year]  CARE TEAM:  PCP: Gilles Chiquito, MD  Outpatient Care Team: Patient Care Team: [name], MD as PCP - General (Internal Medicine) Lafayette Dragon, MD as Consulting Physician (Gastroenterology)  Inpatient Treatment Team: Treatment Team: Attending Provider: Michael Boston, MD; Licensed Practical Nurse: Rolanda Lundborg Dildy, LPN; Registered Nurse: Jonn Shingles, RN; Registered Nurse: Charolette Child, RN; Registered Nurse: Aldean Ast, RN; Physical Therapist: Claretha Cooper, PT   Subjective:  Denies pain Tolerating full liquids Hungry BMx2 yesterday  Objective:  Vital signs:  Filed Vitals:   09/27/14 2142 09/27/14 2357 09/28/14 0520 09/28/14 0628  BP: 148/82 126/74 124/65   Pulse: 103 121 120   Temp: 98.6 F (37 C) 98.8 F (37.1 C) 98.3 F (36.8 C)   TempSrc: Oral Oral Oral   Resp: 20 20 20    Height:      Weight:    175 lb 7.8 oz (79.6 kg)  SpO2: 100% 97% 97%     Last BM Date: 09/23/14  Intake/Output   Yesterday:  02/28 0701 - 02/29 0700 In: 4603 [P.O.:4600; I.V.:3] Out: 225 [Drains:225] This shift:     Bowel function:  Flatus: YES  BM: YES  Drain: serosanguinous - less  Physical Exam:  General: Pt awakens alert, oriented x1 in no acute distress Eyes: PERRL, normal EOM.  Sclera clear.  No icterus Neuro: CN II-XII intact w/o focal sensory/motor deficits. Lymph: No head/neck/groin lymphadenopathy Psych:  No delerium/psychosis/paranoia.  Calm, inquisitive HENT: Normocephalic, Mucus membranes moist.  No thrush Neck: Supple, No tracheal deviation Chest: No chest wall pain w good excursion CV:  Pulses intact.  Regular rhythm MS: Normal AROM mjr joints.  No obvious deformity Abdomen: Soft.  Mildly distended.  Mildly tender at incisions only.  Dressings removed - incision  c/d/i.  No evidence of peritonitis.  No incarcerated hernias. Ext:  SCDs BLE.  No mjr edema.  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 male  3 Days Post-Op  Procedure(s): OPEN ABDOMINAL COLECTOMY RIGID PROCTOSCOPY  Recovering  Plan:  -adv diet to solids per protocol -bowel regimen - watch out for diarrhea vs recurrent constipation -inc BP & HR ? Pain.  Cont ice & tylenol.  Offer oxycodone.  Add metoprolol & follow.  Drain clear w/o n/v.  +BM.  No pain/peritonitis = doubt leak.  Follow -OK off IVF  -f/u path -MS stable - continue clozapine for schizophrenia -VTE prophylaxis- SCDs, etc -mobilize as tolerated to help recovery  D/C patient from hospital when patient meets criteria (anticipate in 1-3 day(s)):  Tolerating oral intake well Ambulating in walkways Adequate pain control without IV medications VSS Urinating  Having flatus   I updated the status of the patient to the patient.  I made recommendations.  I answered questions.  Understanding & appreciation was expressed.   [name] Branch, M.D., F.A.C.S. Gastrointestinal and Minimally Invasive Surgery [facility] [address] [place], [place] [address] [phone] Main / Paging   09/28/2014   Results:   Labs: No results found for this or any previous visit (from  the past 48 hour(s)).  Imaging / Studies: No results found.  Medications / Allergies: per chart  Antibiotics: Anti-infectives    Start     Dose/Rate Route Frequency Ordered Stop   09/26/14 0000  cefoTEtan (CEFOTAN) 2 g in dextrose 5 % 50 mL IVPB     2 g 100 mL/hr over 30 Minutes Intravenous Every 12 hours 09/25/14 1941 09/26/14 0038   09/25/14 1359  clindamycin  (CLEOCIN) 900 mg, gentamicin (GARAMYCIN) 240 mg in sodium chloride 0.9 % 1,000 mL for intraperitoneal lavage  Status:  Discontinued       As needed 09/25/14 1359 09/25/14 1436   09/25/14 0857  cefoTEtan (CEFOTAN) 2 g in dextrose 5 % 50 mL IVPB     2 g 100 mL/hr over 30 Minutes Intravenous On call to O.R. 09/25/14 0857 09/25/14 1120   09/24/14 1345  clindamycin (CLEOCIN) 900 mg, gentamicin (GARAMYCIN) 240 mg in sodium chloride 0.9 % 1,000 mL for intraperitoneal lavage  Status:  Discontinued    Comments:  Pharmacy may adjust dosing strength, schedule, rate of infusion, etc as needed to optimize therapy    Intraperitoneal To Surgery 09/24/14 1341 09/25/14 1941       Note: Portions of this report may have been transcribed using voice recognition software. Every effort was made to ensure accuracy; however, inadvertent computerized transcription errors may be present.   Any transcriptional errors that result from this process are unintentional.

## 2014-09-28 NOTE — Progress Notes (Signed)
Clinical Social Work Department BRIEF PSYCHOSOCIAL ASSESSMENT 09/28/2014  Patient:  [name] Branch,[name] Branch     Account Number:  [id number]     Admit date:  09/25/2014  Clinical Social Worker:  Earlie Server  Date/Time:  09/28/2014 03:00 PM  Referred by:  Physician  Date Referred:  09/28/2014 Referred for  ALF Placement   Other Referral:   Interview type:  Patient Other interview type:    PSYCHOSOCIAL DATA Living Status:  OTHER Admitted from facility:   Level of care:   Primary support name:  Joaquim Lai Primary support relationship to patient:  SITTER Degree of support available:   Adequate    CURRENT CONCERNS Current Concerns  Post-Acute Placement   Other Concerns:    SOCIAL WORK ASSESSMENT / PLAN CSW received referral due to patient being previously placed at ALF. CSW reviewed chart and met with patient at bedside. CSW introduced myself and explained role.    Patient reports that he lives at home with 24/7 caregivers. Patient reports he lived in ALF a long time ago but now lives at home. Per chart review, PT attempted to work with patient but patient ambulating well and PT will follow up later.    CSW will continue to follow to assist with DC planning.   Assessment/plan status:  Psychosocial Support/Ongoing Assessment of Needs Other assessment/ plan:   Information/referral to community resources:   DC resources    PATIENT'S/FAMILY'S RESPONSE TO PLAN OF CARE: Patient alert but minimally engaged. Patient reports he is trying to rest in order to feel better. Patient denies any needs and reports he does not understand why CSW was consulted. Patient reports he enjoys living at home with his caregivers who know him well and plans to return home at DC. Patient agreeable for CSW to continue to follow.       [place], [place] [phone]

## 2014-09-29 MED ORDER — POLYETHYLENE GLYCOL 3350 17 G PO PACK
8.5000 g | PACK | Freq: Two times a day (BID) | ORAL | Status: DC
  Administered 2014-09-29 – 2014-09-30 (×3): 8.5 g via ORAL
  Filled 2014-09-29 (×4): qty 1

## 2014-09-29 MED ORDER — POLYETHYLENE GLYCOL 3350 17 G PO PACK: 8.5000 g | PACK | Freq: Two times a day (BID) | ORAL | Status: DC

## 2014-09-29 MED ORDER — BISMUTH SUBSALICYLATE 262 MG/15ML PO SUSP
30.0000 mL | Freq: Three times a day (TID) | ORAL | Status: DC | PRN
  Filled 2014-09-29: qty 236

## 2014-09-29 NOTE — Evaluation (Signed)
Occupational Therapy Evaluation Patient Details Name: [name] Branch MRN: [id number] DOB: 01-Dec-1965 Today's Date: 09/29/2014    History of Present Illness Pt admitted 09/25/14 and underwent OPEN ABDOMINAL COLECTOMY, RIGID PROCTOSCOPY on the same date.    Clinical Impression   Pt up in room to perform some of his ADL with min to min guard assist. He is very impulsive and moves quickly. Caregiver present and states pt only required supervision for ADL PTA. Will follow to progress safety and independence with ADL.    Follow Up Recommendations  No OT follow up;Supervision/Assistance - 24 hour    Equipment Recommendations  Tub/shower bench (tub transfer bench)    Recommendations for Other Services       Precautions / Restrictions Precautions Precautions: Fall Precaution Comments: abdominal incision and drain      Mobility Bed Mobility Overal bed mobility: Modified Independent                Transfers Overall transfer level: Needs assistance Equipment used: None Transfers: Sit to/from Stand Sit to Stand: Min guard         General transfer comment: pt moves VERY fast, impulsive.    Balance                                            ADL Overall ADL's : Needs assistance/impaired Eating/Feeding: Independent Eating/Feeding Details (indicate cue type and reason): liquids Grooming: Wash/dry hands;Min guard;Standing   Upper Body Bathing: Set up;Sitting   Lower Body Bathing: Min guard;Sit to/from stand   Upper Body Dressing : Set up;Sitting   Lower Body Dressing: Minimal assistance;Sit to/from stand   Toilet Transfer: Minimal assistance;Min guard;Ambulation;BSC   Toileting- Water quality scientist and Hygiene: Min guard;Sit to/from stand         General ADL Comments: Pt's caregiver requesting a seat for the tub for safety. Discussed both options of a seat versus bench and she prefers bench. pt is very impulsive and moves very quickly and  feel a bench would be a good safer option. Pt wiping periareas and slinging washcloth back around in front of him, moving very quickly. Pt trying to don socks but moving again so fast that he needed assist to correctly position sock to have grippers on the bottom. Caregiver states she is with him 24/7 at home. Already has 3in1.     Vision     Perception     Praxis      Pertinent Vitals/Pain Pain Assessment: No/denies pain     Hand Dominance     Extremity/Trunk Assessment Upper Extremity Assessment Upper Extremity Assessment: Overall WFL for tasks assessed           Communication Communication Communication: Other (comment) (difficult to understand, mumbles)   Cognition Arousal/Alertness: Awake/alert Behavior During Therapy: Impulsive Overall Cognitive Status: History of cognitive impairments - at baseline                     General Comments       Exercises       Shoulder Instructions      Home Living Family/patient expects to be discharged to:: Private residence Living Arrangements: Other (Comment) (caregiver) Available Help at Discharge: Personal care attendant Type of Home: House Home Access: Stairs to enter [employer] of Steps: 9 Entrance Stairs-Rails: Right;Left Home Layout: One level     Bathroom Shower/Tub: Tub/shower unit  Bathroom Toilet: Standard     Home Equipment: Bedside commode          Prior Functioning/Environment          Comments: supervision with ADLs    OT Diagnosis: Generalized weakness   OT Problem List: Decreased strength;Decreased knowledge of use of DME or AE   OT Treatment/Interventions: Self-care/ADL training;Patient/family education;Therapeutic activities;DME and/or AE instruction    OT Goals(Current goals can be found in the care plan section) Acute Rehab OT Goals Patient Stated Goal: none stated. up to do more for himself per caregiver. OT Goal Formulation: With patient Time For Goal  Achievement: 10/13/14 Potential to Achieve Goals: Good  OT Frequency: Min 2X/week   Barriers to D/C:            Co-evaluation              End of Session Equipment Utilized During Treatment: Gait belt  Activity Tolerance: Patient tolerated treatment well Patient left: in chair;with call bell/phone within reach;with family/visitor present (caregiver states she will let nursing know if she leaves room)   Time: 6213-0865 OT Time Calculation (min): 24 min Charges:  OT General Charges $OT Visit: 1 Procedure OT Evaluation $Initial OT Evaluation Tier I: 1 Procedure G-Codes:    Jules Schick  [phone]  09/29/2014, 9:51 AM

## 2014-09-29 NOTE — Progress Notes (Signed)
CARE MANAGEMENT NOTE 09/29/2014  Patient:  Branch,[name]   Account Number:  [id number]  Date Initiated:  09/29/2014  Documentation initiated by:  Edwyna Shell  Subjective/Objective Assessment:   49 yo male admitted with colonic inertia s/p abdominal clectomy from home     Action/Plan:   discharge planning   Anticipated DC Date:  09/30/2014   Anticipated DC Plan:  [place]  CM consult      Choice offered to / List presented to:             Status of service:  In process, will continue to follow Medicare Important Message given?  YES (If response is "NO", the following Medicare IM given date fields will be blank) Date Medicare IM given:  09/29/2014 Medicare IM given by:  Edwyna Shell Date Additional Medicare IM given:   Additional Medicare IM given by:    Discharge Disposition:  HOME/SELF CARE  Per UR Regulation:    If discussed at Long Length of Stay Meetings, dates discussed:    Comments:  09/29/14 Edwyna Shell RN BSN CM [phone] Spoke with caregiver in room and she stated that she stays with the patient 24/7 and is confident she can provide any necessary follow up care. Caregiver does not feel that the patient needs any HH services and the only piece of DME she is requesting is a tub bench and a prescription for large depends. Will follow for discharge planning  09/29/14 Edwyna Shell RN BSN CM [phone] Patient engaged minimally and could not provide information. Will follow up with patienty once caregiver returns. Patient lives at home with 24/7 caregivers and prefers to return home with caregivers

## 2014-09-29 NOTE — Progress Notes (Signed)
Physical Therapy Treatment Patient Details Name: Arty Lantzy MRN: [id number] DOB: 24-Dec-1965 Today's Date: 09/29/2014    History of Present Illness Pt admitted 09/25/14 and underwent OPEN ABDOMINAL COLECTOMY, RIGID PROCTOSCOPY on the same date.     PT Comments    Patient ambulated up/down 1 flight of steps with rail and HHA. Has 2 rails at home, cues for safety and sequence. Noted dyspnea 3/4 after walking, encouraged to stop and  Take deep breaths, denied pain.  Follow Up Recommendations  No PT follow up     Equipment Recommendations  None recommended by PT    Recommendations for Other Services       Precautions / Restrictions Precautions Precautions: Fall Precaution Comments: abdominal incision and drain    Mobility  Bed Mobility Overal bed mobility: Modified Independent                Transfers Overall transfer level: Needs assistance Equipment used: None Transfers: Sit to/from Stand Sit to Stand: Supervision         General transfer comment: pt moves VERY fast, impulsive.  Ambulation/Gait Ambulation/Gait assistance: Supervision Ambulation Distance (Feet): 120 Feet Assistive device: None Gait Pattern/deviations: Step-through pattern     General Gait Details: cues to slow down   Stairs   Stairs assistance: Min assist Stair Management: One rail Left;Forwards Number of Stairs: 10 General stair comments: rails too far apart so used a handhold and rail withou any problems.  Wheelchair Mobility    Modified Rankin (Stroke Patients Only)       Balance Overall balance assessment: Needs assistance         Standing balance support: During functional activity;No upper extremity supported Standing balance-Leahy Scale: Good Standing balance comment: turns around stops  without balance loss.                    Cognition Arousal/Alertness: Awake/alert Behavior During Therapy: Impulsive Overall Cognitive Status: History of cognitive  impairments - at baseline                      Exercises      General Comments General comments (skin integrity, edema, etc.): min guard standing to perform hygiene.      Pertinent Vitals/Pain Pain Assessment: No/denies pain    Home Living Family/patient expects to be discharged to:: Private residence Living Arrangements: Other (Comment) (caregiver) Available Help at Discharge: Personal care attendant Type of Home: House Home Access: Stairs to enter Entrance Stairs-Rails: Right;Left Home Layout: One level Home Equipment: Bedside commode      Prior Function        Comments: supervision with ADLs   PT Goals (current goals can now be found in the care plan section) Acute Rehab PT Goals Patient Stated Goal: to practice going up the steps. PT Goal Formulation: With patient/family Time For Goal Achievement: 10/13/14 Potential to Achieve Goals: Good Progress towards PT goals: Progressing toward goals    Frequency  Min 3X/week    PT Plan Current plan remains appropriate    Co-evaluation             End of Session Equipment Utilized During Treatment: Gait belt Activity Tolerance: Patient tolerated treatment well Patient left: in bed;with call bell/phone within reach;with bed alarm set;with family/visitor present     Time: 3710-6269 PT Time Calculation (min) (ACUTE ONLY): 12 min  Charges:  $Gait Training: 8-22 mins  G Codes:      Claretha Cooper 09/29/2014, 1:41 PM Tresa Endo PT [phone]

## 2014-09-29 NOTE — Progress Notes (Signed)
[place]  [address] [place], [place] [address] Phone: [phone] FAX: [phone]    [name] Branch [id number] 12-11-1965  CARE TEAM:  PCP: Gilles Chiquito, MD  Outpatient Care Team: Patient Care Team: [name], MD as PCP - General (Internal Medicine) Lafayette Dragon, MD as Consulting Physician (Gastroenterology)  Inpatient Treatment Team: Treatment Team: Attending Provider: Michael Boston, MD; Licensed Practical Nurse: Rolanda Lundborg Dildy, LPN; Registered Nurse: Charolette Child, RN; Technician: Julian Reil, NT; Occupational Therapist: Jules Schick, OT; Physical Therapist: Claretha Cooper, PT   Subjective:  Denies pain Tolerating soft diet BMx1 per chart but mother notes x6 yesterday  Objective:  Vital signs:  Filed Vitals:   09/28/14 0628 09/28/14 1507 09/28/14 2135 09/29/14 0700  BP:  119/69 130/74 128/71  Pulse:  109 123   Temp:  98.1 F (36.7 C) 97.5 F (36.4 C) 98.9 F (37.2 C)  TempSrc:  Oral Oral Oral  Resp:  18 18 18   Height:      Weight: 175 lb 7.8 oz (79.6 kg)   169 lb 12.1 oz (77 kg)  SpO2:  98% 96% 99%    Last BM Date: 09/28/14  Intake/Output   Yesterday:  02/29 0701 - 03/01 0700 In: 40 [P.O.:720] Out: 24 [Drains:63] This shift:     Bowel function:  Flatus: YES  BM: YES  Drain: serosanguinous - less  Physical Exam:  General: Pt awakens alert, oriented x1 in no acute distress Eyes: PERRL, normal EOM.  Sclera clear.  No icterus Neuro: CN II-XII intact w/o focal sensory/motor deficits. Lymph: No head/neck/groin lymphadenopathy Psych:  No delerium/psychosis/paranoia.  Calm, smiling HENT: Normocephalic, Mucus membranes moist.  No thrush Neck: Supple, No tracheal deviation Chest: No chest wall pain w good excursion CV:  Pulses intact.  Regular rhythm MS: Normal AROM mjr joints.  No obvious deformity Abdomen: Soft.  Mildly distended.  Nontender.   Incision c/d/i.  No evidence of peritonitis.  No incarcerated hernias. Ext:  SCDs BLE.  No mjr edema.  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 male  4 Days Post-Op  Procedure(s): OPEN ABDOMINAL COLECTOMY RIGID PROCTOSCOPY  Recovering  Plan:  -adv diet to [facility] diet -bowel regimen - just low dose Miralax BID.  Watch out for diarrhea vs recurrent constipation.  Try to get accurate I&Os -inc BP & HR ? Pain.  Cont ice & tylenol.  Offer oxycodone.  Add metoprolol & follow.  Drain clear w/o n/v.  +BM.  No pain/peritonitis = doubt leak.  Follow -OK off IVF  -f/u path -d/c drain -MS stable - continue clozapine for schizophrenia -VTE prophylaxis- SCDs, etc -mobilize as tolerated to help recovery  D/C patient from hospital when patient meets criteria (anticipate tomorrow):  Tolerating oral intake well Ambulating in walkways Adequate pain control without IV medications VSS Urinating  Having flatus BMs w/o severe diarrhea   I updated the status of the patient to the patient & his mother.  I made recommendations.  I answered questions.  Understanding & appreciation was expressed.  They are hoping for d/c tomorrow   [name], M.D., F.A.C.S. Gastrointestinal and Minimally Invasive Surgery [facility] [address], [place],  [place] [address] [phone] Main / Paging   09/29/2014   Results:   Labs: No results found for this or any previous visit (from the past 48 hour(s)).  Imaging / Studies: No results found.  Medications / Allergies: per chart  Antibiotics: Anti-infectives    Start     Dose/Rate Route Frequency Ordered Stop   09/26/14 0000  cefoTEtan (CEFOTAN) 2 g in dextrose 5 % 50  mL IVPB     2 g 100 mL/hr over 30 Minutes Intravenous Every 12 hours 09/25/14 1941 09/26/14 0038   09/25/14 1359  clindamycin (CLEOCIN) 900 mg, gentamicin (GARAMYCIN) 240 mg in sodium chloride 0.9 % 1,000 mL for intraperitoneal lavage  Status:  Discontinued       As needed 09/25/14 1359 09/25/14 1436   09/25/14 0857  cefoTEtan (CEFOTAN) 2 g in dextrose 5 % 50 mL IVPB     2 g 100 mL/hr over 30 Minutes Intravenous On call to O.R. 09/25/14 0857 09/25/14 1120   09/24/14 1345  clindamycin (CLEOCIN) 900 mg, gentamicin (GARAMYCIN) 240 mg in sodium chloride 0.9 % 1,000 mL for intraperitoneal lavage  Status:  Discontinued    Comments:  Pharmacy may adjust dosing strength, schedule, rate of infusion, etc as needed to optimize therapy    Intraperitoneal To Surgery 09/24/14 1341 09/25/14 1941       Note: Portions of this report may have been transcribed using voice recognition software. Every effort was made to ensure accuracy; however, inadvertent computerized transcription errors may be present.   Any transcriptional errors that result from this process are unintentional.

## 2014-09-29 NOTE — Progress Notes (Signed)
Clinical Social Work  CSW received call from RN reporting that caregiver wanted to speak with CSW. CSW went to room but caregiver asked questions re: [facility] and equipment. CM aware and will assist with home needs. CSW is signing off but available if needed.  [place], [place] [phone]

## 2014-09-29 NOTE — Evaluation (Signed)
Physical Therapy Evaluation Patient Details Name: [name] Branch MRN: [id number] DOB: Dec 06, 1965 Today's Date: 09/29/2014   History of Present Illness  Pt admitted 09/25/14 and underwent OPEN ABDOMINAL COLECTOMY, RIGID PROCTOSCOPY on the same date.   Clinical Impression  Patient  Ambulating well. Caregiver declined need for HHPT at this time. Will return today to practice stairs.  Patient will benefit from  PT to address problems  Listed below to discharge back to home.    Follow Up Recommendations No PT follow up    Equipment Recommendations    none by PT   Recommendations for Other Services       Precautions / Restrictions Precautions Precautions: Fall Precaution Comments: abdominal incision and drain      Mobility  Bed Mobility Overal bed mobility: Modified Independent                Transfers Overall transfer level: Needs assistance Equipment used: None Transfers: Sit to/from Stand Sit to Stand: Supervision         General transfer comment: pt moves VERY fast, impulsive.  Ambulation/Gait Ambulation/Gait assistance: Min guard;Supervision Ambulation Distance (Feet): 400 Feet Assistive device: None Gait Pattern/deviations: Step-through pattern     General Gait Details: cues to slow down  Stairs            Wheelchair Mobility    Modified Rankin (Stroke Patients Only)       Balance Overall balance assessment: Needs assistance         Standing balance support: During functional activity;No upper extremity supported Standing balance-Leahy Scale: Good Standing balance comment: turns around stops  without balance loss.                             Pertinent Vitals/Pain Pain Assessment: No/denies pain    Home Living Family/patient expects to be discharged to:: Private residence Living Arrangements: Other (Comment) (caregiver) Available Help at Discharge: Personal care attendant Type of Home: House Home Access: Stairs to  enter Entrance Stairs-Rails: Psychiatric nurse of Steps: 9 Home Layout: One level Home Equipment: Bedside commode      Prior Function           Comments: supervision with ADLs     Hand Dominance        Extremity/Trunk Assessment   Upper Extremity Assessment: Overall WFL for tasks assessed           Lower Extremity Assessment: Overall WFL for tasks assessed         Communication   Communication: Other (comment) (difficult to understand, mumbles)  Cognition Arousal/Alertness: Awake/alert Behavior During Therapy: Impulsive Overall Cognitive Status: History of cognitive impairments - at baseline                      General Comments General comments (skin integrity, edema, etc.): min guard standing to perform hygiene.    Exercises        Assessment/Plan    PT Assessment Patient needs continued PT services  PT Diagnosis Generalized weakness   PT Problem List Decreased strength;Decreased mobility  PT Treatment Interventions Gait training;Stair training;Functional mobility training;Therapeutic exercise   PT Goals (Current goals can be found in the Care Plan section) Acute Rehab PT Goals Patient Stated Goal: to practice going up the steps. PT Goal Formulation: With patient/family Time For Goal Achievement: 10/13/14 Potential to Achieve Goals: Good    Frequency Min 3X/week   Barriers to discharge  Co-evaluation               End of Session Equipment Utilized During Treatment: Gait belt Activity Tolerance: Patient tolerated treatment well Patient left: in chair;with call bell/phone within reach;with chair alarm set;with family/visitor present Nurse Communication: Mobility status         Time: 9244-6286 PT Time Calculation (min) (ACUTE ONLY): 17 min   Charges:   PT Evaluation $Initial PT Evaluation Tier I: 1 Procedure     PT G CodesClaretha Cooper 09/29/2014, 12:26 PM Tresa Endo  PT [phone]

## 2014-09-30 MED ORDER — NAPROXEN 500 MG PO TABS: 500.0000 mg | ORAL_TABLET | Freq: Two times a day (BID) | ORAL | Status: DC | PRN

## 2014-09-30 NOTE — Discharge Summary (Signed)
Physician Discharge Summary  Patient ID: [name] Branch MRN: [id number] DOB/AGE: 1965/10/10 49 y.o.  Admit date: 09/25/2014 Discharge date: 09/30/2014  Patient Care Team: [name], MD as PCP - General (Internal Medicine) Lafayette Dragon, MD as Consulting Physician (Gastroenterology)  Admission Diagnoses: Principal Problem:   Colonic inertia s/p abdominal colectomy 09/25/2014 Active Problems:   Sarcoidosis   Schizophrenia, unspecified type   Mental retardation   Essential hypertension   Discharge Diagnoses:  Principal Problem:   Colonic inertia s/p abdominal colectomy 09/25/2014 Active Problems:   Sarcoidosis   Schizophrenia, unspecified type   Mental retardation   Essential hypertension   POST-OPERATIVE DIAGNOSIS:  Severe Colonic Inertia  SURGERY:  Procedure(s): OPEN ABDOMINAL COLECTOMY RIGID PROCTOSCOPY  SURGEON:  Surgeon(s): Michael Boston, MD Leighton Ruff, MD   Diagnosis Colon, segmental resection - BENIGN SMALL BOWEL AND COLONIC MUCOSA, SEE COMMENT. - BENIGN APPENDIX WITH FIBROUS OBLITERATION. - RESECTION MARGINS ARE VIABLE. - NO DYSPLASIA OR MALIGNANCY. Diagnosis Note There is [name] Branch dilatation of the colon with mucosal flattening. While sessile polyp-like features were noted grossly, these areas correspond with scattered lymphoid aggregates microscopically. There is no dysplasia or malignancy. [name] Males MD Pathologist, Electronic Signature (Case signed 09/28/2014) Specimen [name] Branch and Clinical Information Specimen(s) Obtained: Colon, segmental resection Specimen Clinical Information severe colonic inertia (kp) [name] Branch Specimen: The specimen is received fresh labeled "abdominal colectomy". Length: The specimen consist of a total colectomy, with 7.5 cm of terminal ileum, and approximately 155.5 cm of colon. The proximal resection margin is stapled, and the distal margin is open. Serosa: Tan pink to red purple, with attached tan yellow adipose tissue.  Adipose tissue is focally hemorrhagic. The appendix is present, measures 9.6 cm in length x 0.7 cm in diameter. The serosal surface is tan pink and smooth, the mucosa is tan, and the wall measures 0.2 cm in thickness. Representative sections from the central portion of the appendix are submitted fresh for research study. Lumen: The lumen is filled with an abundant amount of solid and semi-solid green brown fecal material. The distal lumen is extensively dilated, with the open circumference ranging from 7.5 cm in the right colon to 27.0 cm at the distal end of the colon. 1 of 2 FINAL for LIBRADO, GUANDIQUE ([id number]) [name] Branch(continued) Mucosa/Wall: The mucosa is tan pink with normal folding. There are multiple tan, sessile polyps identified, measuring approximately 0.3 cm in average (approximately 15). The closest polyp is approximately 8.8 cm from the distal resection margin. No additional mucosal lesions are identified. The mucosa at the distal aspect of the specimen displays moderate to severe flattening of the mucosa folds. The wall ranges from 0.2 to 0.4 cm at the distal aspect, and is slightly fibrotic. Lymph nodes: No enlarged lymph nodes are identified. Block Summary: Eight blocks submitted: A = proximal resection margin B and C = representative sections of distal resection margin D = representative sections of mucosal polyps E - G = representative sections of mucosa H = appendix (KL:ds 09/28/14) Report signed out from the following location(s) Technical component and interpretation was performed at [facility], [place],  [address]. CLIA #: [id number], 2 of  Consults: None  Hospital Course:   The patient underwent the surgery above.  Postoperatively, the patient gradually mobilized and advanced to a solid diet.  Pain and other symptoms were treated aggressively.  Mild loose BMs but controlled.  Many laxatives d/c'd.  Just 1/2 dose miralax to  thicken stools.  Drain d/c'd.  Mild tachycardia at  night but otherwise stable  By the time of discharge, the patient was walking well the hallways, eating food, having flatus.  Cleared by PT.  Pain was minimal & well-controlled on an oral medications.  Based on meeting discharge criteria and continuing to recover, I felt it was safe for the patient to be discharged from the hospital to further recover with close followup. Postoperative recommendations were discussed in detail to patient & his mother (caregiver).  They are written as well.  Effort made to provide supplies for home   Significant Diagnostic Studies:  No results found for this or any previous visit (from the past 72 hour(s)).  No results found.  Discharge Exam: Blood pressure 118/74, pulse 117, temperature 98.2 F (36.8 C), temperature source Oral, resp. rate 20, height 6\' 2"  (1.88 m), weight 172 lb 2.9 oz (78.1 kg), SpO2 100 %.  General: Pt awake/alert/oriented x4 in no major acute distress.  Sitting up to eat, smiling. Eyes: PERRL, normal EOM. Sclera nonicteric Neuro: CN II-XII intact w/o focal sensory/motor deficits. Lymph: No head/neck/groin lymphadenopathy Psych:  No delerium/psychosis/paranoia.  Relaxed. HENT: Normocephalic, Mucus membranes moist.  No thrush Neck: Supple, No tracheal deviation Chest: No pain.  Good respiratory excursion. CV:  Pulses intact.  Regular rhythm MS: Normal AROM mjr joints.  No obvious deformity Abdomen: Soft, Nondistended.  Incision closed.  Nontender.  No incarcerated hernias.  No guarding/RT/peritonitis Ext:  SCDs BLE.  No significant edema.  No cyanosis Skin: No petechiae / purpura  Discharged Condition: good   Past Medical History  Diagnosis Date  . Colonic inertia   . Hyperlipidemia   . Constipation   . Sarcoidosis   . 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 Schizophrenia   . Mental retardation   . Cardiomyopathy   . Glaucoma   . Obstruction of colon     recurrent  . ABSCESS,  ANAL/RECTAL REGIONS 08/18/2006    Annotation: Fournier gangrene Qualifier: History of  By: Garnette Scheuermann MD, Arlee Muslim    . 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    Annotation: chronic with stercoral ulcer  Qualifier: Diagnosis of  By: Garnette Scheuermann MD, Arlee Muslim      Past Surgical History  Procedure Laterality Date  . Groin surgery      boil drained  . Colectomy N/A 09/25/2014    Procedure: OPEN ABDOMINAL COLECTOMY;  Surgeon: Michael Boston, MD;  Location: [facility];  Service: General;  Laterality: N/A;  . Proctoscopy N/A 09/25/2014    Procedure: RIGID PROCTOSCOPY;  Surgeon: Michael Boston, MD;  Location: [facility];  Service: General;  Laterality: N/A;    History   Social History  . Marital Status: Single    Spouse Name: N/A  . Number of Children: 0  . Years of Education: N/A   Occupational History  .     Social History Main Topics  . Smoking status: Never Smoker   . Smokeless tobacco: Never Used  . Alcohol Use: No  . Drug Use: No  . Sexual Activity: Not on file     Comment:     Other Topics Concern  . Not on file   Social History Narrative   Has a caretaker    Family History  Problem Relation Age of Onset  . Diabetes Mother   . Colon cancer Neg Hx     Current Facility-Administered Medications  Medication Dose Route Frequency Provider Last Rate Last Dose  . acetaminophen (TYLENOL) tablet 1,000 mg  1,000 mg Oral TID Michael Boston, MD  1,000 mg at 09/29/14 2245  . alum & mag hydroxide-simeth (MAALOX/MYLANTA) 200-200-20 MG/5ML suspension 30 mL  30 mL Oral Q6H PRN Michael Boston, MD      . bismuth subsalicylate (PEPTO BISMOL) 262 MG/15ML suspension 30 mL  30 mL Oral Q8H PRN Michael Boston, MD      . cloZAPine (CLOZARIL) tablet 125 mg  125 mg Oral BID Michael Boston, MD   125 mg at 09/29/14 2245  . diphenhydrAMINE (BENADRYL) 12.5 MG/5ML elixir 12.5 mg  12.5 mg Oral Q6H PRN Michael Boston, MD       Or  . diphenhydrAMINE (BENADRYL) injection 12.5 mg  12.5 mg Intravenous Q6H PRN Michael Boston, MD      . feeding  supplement (ENSURE COMPLETE) (ENSURE COMPLETE) liquid 237 mL  237 mL Oral BID BM Michael Boston, MD   237 mL at 09/29/14 1400  . fluvoxaMINE (LUVOX) tablet 300 mg  300 mg Oral QHS Michael Boston, MD   300 mg at 09/29/14 2245  . haloperidol lactate (HALDOL) injection 2-5 mg  2-5 mg Intravenous Q6H PRN Michael Boston, MD      . heparin injection 5,000 Units  5,000 Units Subcutaneous 3 times per day Michael Boston, MD   5,000 Units at 09/30/14 [phone]  . HYDROmorphone (DILAUDID) injection 0.5-2 mg  0.5-2 mg Intravenous Q1H PRN Michael Boston, MD   2 mg at 09/28/14 1527  . latanoprost (XALATAN) 0.005 % ophthalmic solution 1 drop  1 drop Both Eyes QHS Michael Boston, MD   1 drop at 09/29/14 2245  . LORazepam (ATIVAN) injection 0.5-1 mg  0.5-1 mg Intravenous Q8H PRN Michael Boston, MD      . metoprolol tartrate (LOPRESSOR) tablet 12.5 mg  12.5 mg Oral BID Michael Boston, MD   12.5 mg at 09/29/14 2245  . metoprolol tartrate (LOPRESSOR) tablet 12.5 mg  12.5 mg Oral Q12H PRN Michael Boston, MD   12.5 mg at 09/28/14 2135  . multivitamin with minerals tablet 1 tablet  1 tablet Oral Daily Michael Boston, MD   1 tablet at 09/29/14 1100  . ondansetron (ZOFRAN) tablet 4 mg  4 mg Oral Q6H PRN Michael Boston, MD       Or  . ondansetron [facility]) injection 4 mg  4 mg Intravenous Q6H PRN Michael Boston, MD      . oxyCODONE (Oxy IR/ROXICODONE) immediate release tablet 5-10 mg  5-10 mg Oral Q4H PRN Michael Boston, MD      . polyethylene glycol (MIRALAX / GLYCOLAX) packet 8.5 g  8.5 g Oral BID Michael Boston, MD   8.5 g at 09/29/14 2247  . pravastatin (PRAVACHOL) tablet 40 mg  40 mg Oral Daily Michael Boston, MD   40 mg at 09/29/14 1100  . saccharomyces boulardii (FLORASTOR) capsule 250 mg  250 mg Oral BID Michael Boston, MD   250 mg at 09/29/14 2247  . zolpidem (AMBIEN) tablet 5-10 mg  5-10 mg Oral QHS PRN Michael Boston, MD         No Known Allergies  Disposition: 01-Home or Self Care  Discharge Instructions    Call MD for:  extreme fatigue     Complete by:  As directed      Call MD for:  extreme fatigue    Complete by:  As directed      Call MD for:  hives    Complete by:  As directed      Call MD for:  hives    Complete by:  As directed  Call MD for:  persistant nausea and vomiting    Complete by:  As directed      Call MD for:  persistant nausea and vomiting    Complete by:  As directed      Call MD for:  redness, tenderness, or signs of infection (pain, swelling, redness, odor or green/yellow discharge around incision site)    Complete by:  As directed      Call MD for:  redness, tenderness, or signs of infection (pain, swelling, redness, odor or green/yellow discharge around incision site)    Complete by:  As directed      Call MD for:  severe uncontrolled pain    Complete by:  As directed      Call MD for:  severe uncontrolled pain    Complete by:  As directed      Call MD for:    Complete by:  As directed   Temperature > 101.552F     Call MD for:    Complete by:  As directed   Temperature > 101.552F     Diet - low sodium heart healthy    Complete by:  As directed      Discharge instructions    Complete by:  As directed   Please see discharge instruction sheets.  Also refer to handout given an office.  Please call our office if you have any questions or concerns [phone]     Discharge instructions    Complete by:  As directed   Please see discharge instruction sheets.  Also refer to handout given an office.  Please call our office if you have any questions or concerns [phone]     Discharge wound care:    Complete by:  As directed   If you have closed incisions, shower and bathe over these incisions with soap and water every day.  Remove all surgical dressings on postoperative day #3.  You do not need to replace dressings over the closed incisions unless you feel more comfortable with a Band-Aid covering it.   If you have an open wound that requires packing, please see wound care instructions.  In  general, remove all dressings, wash wound with soap and water and then replace with saline moistened gauze.  Do the dressing change at least every day.  Please call our office [phone] if you have further questions.     Discharge wound care:    Complete by:  As directed   If you have closed incisions, shower and bathe over these incisions with soap and water every day.  Remove all surgical dressings on postoperative day #3.  You do not need to replace dressings over the closed incisions unless you feel more comfortable with a Band-Aid covering it.   If you have an open wound that requires packing, please see wound care instructions.  In general, remove all dressings, wash wound with soap and water and then replace with saline moistened gauze.  Do the dressing change at least every day.  Please call our office [phone] if you have further questions.     Driving Restrictions    Complete by:  As directed   No driving until off narcotics and can safely swerve away without pain during an emergency     Driving Restrictions    Complete by:  As directed   No driving until off narcotics and can safely swerve away without pain during an emergency     Incontinence supply  Complete by:  As directed      Incontinence supply    Complete by:  As directed      Increase activity slowly    Complete by:  As directed   Walk an hour a day.  Use 20-30 minute walks.  When you can walk 30 minutes without difficulty, increase to low impact/moderate activities such as biking, jogging, swimming, sexual activity..  Eventually can increase to unrestricted activity when not feeling pain.  If you feel pain: STOP!Marland Kitchen   Let pain protect you from overdoing it.  Use ice/heat/over-the-counter pain medications to help minimize his soreness.  Use pain prescriptions as needed to remain active.  It is better to take extra pain medications and be more active than to stay bedridden to avoid all pain medications.     Increase  activity slowly    Complete by:  As directed   Walk an hour a day.  Use 20-30 minute walks.  When you can walk 30 minutes without difficulty, increase to low impact/moderate activities such as biking, jogging, swimming, sexual activity..  Eventually can increase to unrestricted activity when not feeling pain.  If you feel pain: STOP!Marland Kitchen   Let pain protect you from overdoing it.  Use ice/heat/over-the-counter pain medications to help minimize his soreness.  Use pain prescriptions as needed to remain active.  It is better to take extra pain medications and be more active than to stay bedridden to avoid all pain medications.     Lifting restrictions    Complete by:  As directed   Avoid heavy lifting initially.  Do not push through pain.  You have no specific weight limit.  Coughing and sneezing or four more stressful to your incision than any lifting you will do. Pain will protect you from injury.  Therefore, avoid intense activity until off all narcotic pain medications.  Coughing and sneezing or four more stressful to your incision than any lifting he will do.     Lifting restrictions    Complete by:  As directed   Avoid heavy lifting initially.  Do not push through pain.  You have no specific weight limit.  Coughing and sneezing or four more stressful to your incision than any lifting you will do. Pain will protect you from injury.  Therefore, avoid intense activity until off all narcotic pain medications.  Coughing and sneezing or four more stressful to your incision than any lifting he will do.     May shower / Bathe    Complete by:  As directed      May shower / Bathe    Complete by:  As directed      May walk up steps    Complete by:  As directed      May walk up steps    Complete by:  As directed      Sexual Activity Restrictions    Complete by:  As directed   Sexual activity as tolerated.  Do not push through pain.  Pain will protect you from injury.     Sexual Activity Restrictions     Complete by:  As directed   Sexual activity as tolerated.  Do not push through pain.  Pain will protect you from injury.     Walk with assistance    Complete by:  As directed   Walk over an hour a day.  May use a walker/cane/companion to help with balance and stamina.     Walk with assistance  Complete by:  As directed   Walk over an hour a day.  May use a walker/cane/companion to help with balance and stamina.            Medication List    STOP taking these medications        bisacodyl 5 MG EC tablet  Commonly known as:  DULCOLAX     lubiprostone 24 MCG capsule  Commonly known as:  AMITIZA     magnesium citrate Soln     Magnesium Hydroxide 2400 MG/10ML Susp  Commonly known as:  MILK OF MAGNESIA CONCENTRATE     polyethylene glycol powder powder  Commonly known as:  GLYCOLAX/MIRALAX  Replaced by:  polyethylene glycol packet     psyllium 58.6 % powder  Commonly known as:  METAMUCIL      TAKE these medications        cloZAPine 25 MG tablet  Commonly known as:  CLOZARIL  Take 25 mg by mouth 2 (two) times daily.     cloZAPine 100 MG tablet  Commonly known as:  CLOZARIL  Take 100 mg by mouth 2 (two) times daily.     ENSURE COMPLETE SHAKE Liqd  Take 1 Can by mouth 2 (two) times daily.     fluvoxaMINE 100 MG tablet  Commonly known as:  LUVOX  Take 300 mg by mouth at bedtime.     multivitamin capsule  Take 1 capsule by mouth daily.     naproxen 500 MG tablet  Commonly known as:  NAPROSYN  Take 1 tablet (500 mg total) by mouth every 12 (twelve) hours as needed for mild pain or moderate pain.     oxyCODONE 5 MG immediate release tablet  Commonly known as:  Oxy IR/ROXICODONE  Take 1-2 tablets (5-10 mg total) by mouth every 4 (four) hours as needed for moderate pain, severe pain or breakthrough pain.     polyethylene glycol packet  Commonly known as:  MIRALAX / GLYCOLAX  Take 8.5 g by mouth 2 (two) times daily.     pravastatin 40 MG tablet  Commonly known as:   PRAVACHOL  TAKE ONE TABLET EACH DAY     travoprost (benzalkonium) 0.004 % ophthalmic solution  Commonly known as:  TRAVATAN  Place 1 drop into both eyes at bedtime.           Follow-up Information    Follow up with Terius Jacuinde C., MD In 3 weeks.   Specialty:  General Surgery   Why:  To follow up after your operation, To follow up after your hospital stay   Contact information:   [place] [address] [phone]        Signed: Morton Peters, M.D., F.A.C.S. Gastrointestinal and Minimally Invasive Surgery [facility] [address], [place], [place] [address] [phone] Main / Paging   09/30/2014, 8:41 AM

## 2014-09-30 NOTE — Progress Notes (Signed)
Thanks so much, I reviewed the op note. He will be much better off.  Best [name]

## 2014-09-30 NOTE — Progress Notes (Signed)
Discharge instructions given to pt/ caregiver, verbalized understanding. Left the unit in stable condition.

## 2014-10-01 ENCOUNTER — Telehealth: Payer: Self-pay | Admitting: Internal Medicine

## 2014-10-01 NOTE — Telephone Encounter (Signed)
Left message to call back  

## 2014-10-04 ENCOUNTER — Encounter (HOSPITAL_COMMUNITY): Payer: Self-pay | Admitting: Emergency Medicine

## 2014-10-04 ENCOUNTER — Inpatient Hospital Stay (HOSPITAL_COMMUNITY)
Admission: EM | Admit: 2014-10-04 | Discharge: 2014-10-07 | DRG: 811 | Disposition: A | Discharge status: Home / Self Care | Payer: Medicare Other | Attending: Surgery | Admitting: Surgery

## 2014-10-04 DIAGNOSIS — D638 Anemia in other chronic diseases classified elsewhere: Secondary | ICD-10-CM | POA: Diagnosis not present

## 2014-10-04 DIAGNOSIS — R161 Splenomegaly, not elsewhere classified: Secondary | ICD-10-CM | POA: Diagnosis present

## 2014-10-04 DIAGNOSIS — N179 Acute kidney failure, unspecified: Secondary | ICD-10-CM | POA: Diagnosis present

## 2014-10-04 DIAGNOSIS — D649 Anemia, unspecified: Secondary | ICD-10-CM

## 2014-10-04 DIAGNOSIS — K599 Functional intestinal disorder, unspecified: Secondary | ICD-10-CM | POA: Diagnosis not present

## 2014-10-04 DIAGNOSIS — E861 Hypovolemia: Secondary | ICD-10-CM | POA: Diagnosis present

## 2014-10-04 DIAGNOSIS — I429 Cardiomyopathy, unspecified: Secondary | ICD-10-CM | POA: Diagnosis present

## 2014-10-04 DIAGNOSIS — Z9049 Acquired absence of other specified parts of digestive tract: Secondary | ICD-10-CM | POA: Diagnosis present

## 2014-10-04 DIAGNOSIS — Z6821 Body mass index (BMI) 21.0-21.9, adult: Secondary | ICD-10-CM | POA: Diagnosis not present

## 2014-10-04 DIAGNOSIS — K592 Neurogenic bowel, not elsewhere classified: Secondary | ICD-10-CM | POA: Diagnosis present

## 2014-10-04 DIAGNOSIS — F209 Schizophrenia, unspecified: Secondary | ICD-10-CM | POA: Diagnosis present

## 2014-10-04 DIAGNOSIS — H409 Unspecified glaucoma: Secondary | ICD-10-CM | POA: Diagnosis present

## 2014-10-04 DIAGNOSIS — K567 Ileus, unspecified: Secondary | ICD-10-CM

## 2014-10-04 DIAGNOSIS — E785 Hyperlipidemia, unspecified: Secondary | ICD-10-CM | POA: Diagnosis present

## 2014-10-04 DIAGNOSIS — D869 Sarcoidosis, unspecified: Secondary | ICD-10-CM | POA: Diagnosis present

## 2014-10-04 DIAGNOSIS — I959 Hypotension, unspecified: Secondary | ICD-10-CM

## 2014-10-04 DIAGNOSIS — E46 Unspecified protein-calorie malnutrition: Secondary | ICD-10-CM | POA: Diagnosis present

## 2014-10-04 DIAGNOSIS — R579 Shock, unspecified: Secondary | ICD-10-CM | POA: Diagnosis not present

## 2014-10-04 DIAGNOSIS — G4733 Obstructive sleep apnea (adult) (pediatric): Secondary | ICD-10-CM | POA: Diagnosis not present

## 2014-10-04 DIAGNOSIS — F71 Moderate intellectual disabilities: Secondary | ICD-10-CM | POA: Diagnosis present

## 2014-10-04 DIAGNOSIS — R55 Syncope and collapse: Secondary | ICD-10-CM | POA: Diagnosis present

## 2014-10-04 DIAGNOSIS — D62 Acute posthemorrhagic anemia: Secondary | ICD-10-CM | POA: Diagnosis not present

## 2014-10-04 DIAGNOSIS — R571 Hypovolemic shock: Secondary | ICD-10-CM | POA: Diagnosis present

## 2014-10-04 DIAGNOSIS — D500 Iron deficiency anemia secondary to blood loss (chronic): Secondary | ICD-10-CM | POA: Diagnosis present

## 2014-10-04 DIAGNOSIS — I4892 Unspecified atrial flutter: Secondary | ICD-10-CM | POA: Diagnosis not present

## 2014-10-04 DIAGNOSIS — R140 Abdominal distension (gaseous): Secondary | ICD-10-CM

## 2014-10-04 LAB — CBC WITH DIFFERENTIAL/PLATELET
Basophils Absolute: 0 10*3/uL (ref 0.0–0.1)
Basophils Relative: 0 % (ref 0–1)
EOS ABS: 0 10*3/uL (ref 0.0–0.7)
EOS PCT: 0 % (ref 0–5)
HCT: 24 % — ABNORMAL LOW (ref 39.0–52.0)
Hemoglobin: 7.6 g/dL — ABNORMAL LOW (ref 13.0–17.0)
LYMPHS PCT: 15 % (ref 12–46)
Lymphs Abs: 1.4 10*3/uL (ref 0.7–4.0)
MCH: 29.7 pg (ref 26.0–34.0)
MCHC: 31.7 g/dL (ref 30.0–36.0)
MCV: 93.8 fL (ref 78.0–100.0)
MONO ABS: 0.7 10*3/uL (ref 0.1–1.0)
Monocytes Relative: 7 % (ref 3–12)
NEUTROS ABS: 7.2 10*3/uL (ref 1.7–7.7)
NEUTROS PCT: 78 % — AB (ref 43–77)
Platelets: 382 10*3/uL (ref 150–400)
RBC: 2.56 MIL/uL — ABNORMAL LOW (ref 4.22–5.81)
RDW: 16.4 % — AB (ref 11.5–15.5)
WBC: 9.3 10*3/uL (ref 4.0–10.5)

## 2014-10-04 LAB — VITAMIN B12: Vitamin B-12: 575 pg/mL (ref 211–911)

## 2014-10-04 LAB — FOLATE: Folate: 15 ng/mL

## 2014-10-04 LAB — POC OCCULT BLOOD, ED: Fecal Occult Bld: POSITIVE — AB

## 2014-10-04 LAB — IRON AND TIBC
Iron: 106 ug/dL (ref 42–165)
Saturation Ratios: 80 % — ABNORMAL HIGH (ref 20–55)
TIBC: 132 ug/dL — ABNORMAL LOW (ref 215–435)
UIBC: 26 ug/dL — ABNORMAL LOW (ref 125–400)

## 2014-10-04 LAB — BASIC METABOLIC PANEL
Anion gap: 7 (ref 5–15)
BUN: 30 mg/dL — AB (ref 6–23)
CO2: 28 mmol/L (ref 19–32)
Calcium: 7.9 mg/dL — ABNORMAL LOW (ref 8.4–10.5)
Chloride: 102 mmol/L (ref 96–112)
Creatinine, Ser: 1.19 mg/dL (ref 0.50–1.35)
GFR calc Af Amer: 82 mL/min — ABNORMAL LOW (ref >90)
GFR, EST NON AFRICAN AMERICAN: 71 mL/min — AB (ref >90)
Glucose, Bld: 115 mg/dL — ABNORMAL HIGH (ref 70–99)
Potassium: 3.6 mmol/L (ref 3.5–5.1)
SODIUM: 137 mmol/L (ref 135–145)

## 2014-10-04 LAB — PREPARE RBC (CROSSMATCH)

## 2014-10-04 MED ORDER — SODIUM CHLORIDE 0.9 % IV SOLN: Freq: Once | INTRAVENOUS | Status: AC

## 2014-10-04 MED ORDER — CLOZAPINE 25 MG PO TABS
125.0000 mg | ORAL_TABLET | Freq: Two times a day (BID) | ORAL | Status: DC
  Administered 2014-10-04 – 2014-10-07 (×6): 125 mg via ORAL
  Filled 2014-10-04 (×8): qty 1

## 2014-10-04 MED ORDER — SODIUM CHLORIDE 0.9 % IV SOLN
Freq: Once | INTRAVENOUS | Status: AC
Start: 2014-10-04 — End: 2014-10-04
  Administered 2014-10-04: 22:00:00 via INTRAVENOUS

## 2014-10-04 MED ORDER — PANTOPRAZOLE SODIUM 40 MG IV SOLR
40.0000 mg | Freq: Every day | INTRAVENOUS | Status: DC
  Administered 2014-10-04: 40 mg via INTRAVENOUS
  Filled 2014-10-04: qty 40

## 2014-10-04 MED ORDER — ENSURE COMPLETE PO LIQD
237.0000 mL | Freq: Two times a day (BID) | ORAL | Status: DC
  Administered 2014-10-04 (×2): 237 mL via ORAL

## 2014-10-04 MED ORDER — TRAVOPROST 0.004 % OP SOLN: 1.0000 [drp] | Freq: Every day | OPHTHALMIC | Status: DC

## 2014-10-04 MED ORDER — ACETAMINOPHEN 650 MG RE SUPP: 650.0000 mg | Freq: Four times a day (QID) | RECTAL | Status: DC | PRN

## 2014-10-04 MED ORDER — FLUVOXAMINE MALEATE 100 MG PO TABS
300.0000 mg | ORAL_TABLET | Freq: Every day | ORAL | Status: DC
  Administered 2014-10-04 – 2014-10-06 (×3): 300 mg via ORAL
  Filled 2014-10-04 (×5): qty 3

## 2014-10-04 MED ORDER — LATANOPROST 0.005 % OP SOLN
1.0000 [drp] | Freq: Every day | OPHTHALMIC | Status: DC
  Administered 2014-10-04 – 2014-10-06 (×3): 1 [drp] via OPHTHALMIC
  Filled 2014-10-04 (×2): qty 2.5

## 2014-10-04 MED ORDER — ACETAMINOPHEN 325 MG PO TABS: 650.0000 mg | ORAL_TABLET | Freq: Four times a day (QID) | ORAL | Status: DC | PRN

## 2014-10-04 MED ORDER — SODIUM CHLORIDE 0.9 % IV BOLUS (SEPSIS)
2000.0000 mL | Freq: Once | INTRAVENOUS | Status: AC
  Administered 2014-10-04: 2000 mL via INTRAVENOUS

## 2014-10-04 MED ORDER — KCL IN DEXTROSE-NACL 20-5-0.45 MEQ/L-%-% IV SOLN
INTRAVENOUS | Status: DC
Start: 2014-10-04 — End: 2014-10-06
  Administered 2014-10-04 – 2014-10-05 (×2): via INTRAVENOUS
  Filled 2014-10-04 (×4): qty 1000

## 2014-10-04 MED ORDER — CLOZAPINE 100 MG PO TABS: 100.0000 mg | ORAL_TABLET | Freq: Two times a day (BID) | ORAL | Status: DC

## 2014-10-04 MED ORDER — HYDROCODONE-ACETAMINOPHEN 5-325 MG PO TABS: 1.0000 | ORAL_TABLET | ORAL | Status: DC | PRN

## 2014-10-04 MED ORDER — SODIUM CHLORIDE 0.9 % IV SOLN
INTRAVENOUS | Status: DC
  Administered 2014-10-04: 15:00:00 via INTRAVENOUS

## 2014-10-04 MED ORDER — CLOZAPINE 25 MG PO TABS: 25.0000 mg | ORAL_TABLET | Freq: Two times a day (BID) | ORAL | Status: DC

## 2014-10-04 MED ORDER — SODIUM CHLORIDE 0.9 % IV BOLUS (SEPSIS)
1000.0000 mL | Freq: Once | INTRAVENOUS | Status: AC
  Administered 2014-10-04: 1000 mL via INTRAVENOUS

## 2014-10-04 NOTE — ED Notes (Signed)
As I write this, he has 2 liters of saline infusing concomitantly via #22 left forearm.  Pt. Has been, and remains awake, alert and in no distress, and is relaxed in appearance.  A lady is with him who identifies herself as "his caregiver-he lives with me".

## 2014-10-04 NOTE — ED Notes (Signed)
I have just called report to Langley Gauss, RN in ICU and will transport shortly.  He remains calm and in no distress.

## 2014-10-04 NOTE — Consult Note (Addendum)
[facility] Medical Consultation  Tukker Byrns TGG:[id number] DOB: 08/03/1965 DOA: 10/04/2014 PCP: Gilles Chiquito, MD   Requesting physician: Gen surgery Date of consultation: 10/04/14 Reason for consultation: Hypovolemia   Impression/Recommendations Principal Problem:   Fall 2/2 toHypotensive episode secondary to volume depletion-etiology likely secondary to poor by mouth intake, his guardian and personal lives with him tells me that he is only been eating about half of what he normally would eat.  It is notable that his BUN/creatinine have raised from 12/0.9-->30/1.19 since discharge so he probably has more volume depletion secondary to poor by mouth intake-lastly his hemoglobin has dropped 1.5 points from 9.1-->7.6 so he will receive 2 units of packed red blood cells.  I will get a free cortisol and a random a.m. cortisol to ensure that we are not missing any type of Addison's process although that is unlikely.  It is unlikely that he has any cardiac event or any organic brain pathology causing this event as this can be easily explained by his volume depletion. I expect that this will get better and we will get orthostatics in the morning 3/7.   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 CT scan of the chest 08/03/14 showed tiny nodule in the lung and 8 mm low-density inferior right liver lesion which is thought to be benign--to be academic, we could consider ACE levels as an outpatient and I will defer that to Dr. 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 We are happy to consult and assist in any way that we are  needed.-Hold off oxycodone 5 for now   Borderline cardiomyopathy-unclear etiology. EF last noted on 01/02/03 echo 50-55%   Hyperlipidemia continue Pravachol 40 daily   Impaired glucose tolerance-consider outpatient A1c     thank you for having [place] anticipated in this patient's care -We will follow patient while hospitalized.  Please contact me directly if I can be of assistance in the meanwhile.   Chief Complaint: fall, hypotension  HPI:  49 y/o ? group home resident h/o Mental retardation/Shizophrenia, hypersomnia c OSA-Isch CM EF 55-60%, Splenomegally secondary to sarcoidosis, prior Fournier's gangrene, ?Sarcoid, colonic inertia + Severe Obstipation [very redundant colon on last Colonoscopy 12/2005--multiple admissions for Fecal impaction/COlonic overflow--it was decided 12/30 that 2/2 to Ct evidence of Hydronephrosi and risk of possible acute kidney injury from overdistended colon that he might need a total colectomy He had a open abdominal colectomy with rigid proctoscope perrformed on 09/25/14 by Dr. gross and was discharged subsequently on 09/30/14 during hospital stay he was appropriately advanced to solid diet and he had mild loose BMs but they were controlled and multiple laxatives were discontinued. He went home and subsequent to this on the fourth had an episode of nausea and subsequent to that episode has not been eating much since then, maybe about 50% of what he normally takes in..  He has been passing stools regularly 3-4 stools a day During the night on 3/5 he had an episode of diarrhea that was nonbloody and subsequently got up this morning and fell about 3 times. He did not hurt anywhere on his body. His caregiver tells me that when he had his surgery  for Fournier's gangrene in about 2004 he had similar episode with falls and weakness and was fluid repleted the day after surgery and had come back to the hospital for this  In the emergency room he was given 2 L of IV fluids to IVs were  placed in either arm and is blood sugar came up from the 95M systolic to close to the 841 systolic.   I can't obtain review of systems directly from patient given his mental retardation  Past Medical History  Diagnosis Date  . Colonic inertia   . Hyperlipidemia   . Constipation   . Sarcoidosis   . 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 Schizophrenia   . Mental retardation   . Cardiomyopathy   . Glaucoma   . Obstruction of colon     recurrent  . ABSCESS, ANAL/RECTAL REGIONS 08/18/2006    Annotation: Fournier gangrene Qualifier: History of  By: Garnette Scheuermann MD, Arlee Muslim    . 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    Annotation: chronic with stercoral ulcer  Qualifier: Diagnosis of  By: Garnette Scheuermann MD, Arlee Muslim     Past Surgical History  Procedure Laterality Date  . Groin surgery      boil drained  . Colectomy N/A 09/25/2014    Procedure: OPEN ABDOMINAL COLECTOMY;  Surgeon: Michael Boston, MD;  Location: [facility];  Service: General;  Laterality: N/A;  . Proctoscopy N/A 09/25/2014    Procedure: RIGID PROCTOSCOPY;  Surgeon: Michael Boston, MD;  Location: [facility];  Service: General;  Laterality: N/A;   Social History:  reports that he has never smoked. He has never used smokeless tobacco. He reports that he does not drink alcohol or use illicit drugs.  No Known Allergies Family History  Problem Relation Age of Onset  . Diabetes Mother   . Colon cancer Neg Hx     Prior to Admission medications   Medication Sig Start Date End Date Taking? Authorizing Provider  cloZAPine (CLOZARIL) 100 MG tablet Take 100 mg by mouth 2 (two) times daily.    Yes Historical Provider, MD  cloZAPine (CLOZARIL) 25 MG tablet Take 25 mg by mouth 2 (two) times daily.   Yes Historical Provider, MD  fluvoxaMINE (LUVOX) 100 MG tablet Take 300 mg by mouth at bedtime.   Yes Historical Provider, MD  Multiple Vitamin (MULTIVITAMIN) capsule Take 1 capsule by mouth daily. 09/16/14  Yes [name], MD  naproxen (NAPROSYN) 500 MG tablet Take 1 tablet  (500 mg total) by mouth every 12 (twelve) hours as needed for mild pain or moderate pain. 09/30/14  Yes Michael Boston, MD  Nutritional Supplements (ENSURE COMPLETE SHAKE) LIQD Take 1 Can by mouth 2 (two) times daily. 07/29/14  Yes Lafayette Dragon, MD  oxyCODONE (OXY IR/ROXICODONE) 5 MG immediate release tablet Take 1-2 tablets (5-10 mg total) by mouth every 4 (four) hours as needed for moderate pain, severe pain or breakthrough pain. 09/25/14  Yes Michael Boston, MD  polyethylene glycol (MIRALAX / GLYCOLAX) packet Take 8.5 g by mouth 2 (two) times daily. 09/29/14  Yes Michael Boston, MD  pravastatin (PRAVACHOL) 40 MG tablet TAKE ONE TABLET EACH DAY 08/28/14  Yes [name], MD  travoprost, benzalkonium, (TRAVATAN) 0.004 % ophthalmic solution Place 1 drop into both eyes at bedtime.    Yes Historical Provider, MD   Physical Exam: Blood pressure 95/51, pulse 110, temperature 98 F (36.7 C), temperature source Oral, resp. rate 18, SpO2 99 %. Filed Vitals:   10/04/14 1226  BP:  95/51  Pulse: 110  Temp:   Resp: 18   Monosyllable disheveled EOMI no, no pallor Abdomen soft benign no rebound no guarding midline scar very well-healed by primary intention, bowel sounds are heard Chest is clinically clear no added sound, ' moderate dentition,? JVD left side Neuro is Grossly intact moving all 4 limbs equally without issue.  Labs on Admission:  Basic Metabolic Panel:  Recent Labs Lab 10/04/14 1008  NA 137  K 3.6  CL 102  CO2 28  GLUCOSE 115*  BUN 30*  CREATININE 1.19  CALCIUM 7.9*   Liver Function Tests: No results for input(s): AST, ALT, ALKPHOS, BILITOT, PROT, ALBUMIN in the last 168 hours. No results for input(s): LIPASE, AMYLASE in the last 168 hours. No results for input(s): AMMONIA in the last 168 hours. CBC:  Recent Labs Lab 10/04/14 1008  WBC 9.3  NEUTROABS 7.2  HGB 7.6*  HCT 24.0*  MCV 93.8  PLT 382   Cardiac Enzymes: No results for input(s): CKTOTAL, CKMB, CKMBINDEX,  TROPONINI in the last 168 hours. BNP: Invalid input(s): POCBNP CBG: No results for input(s): GLUCAP in the last 168 hours.  Radiological Exams on Admission: No results found.  EKG: Independently reviewed. sinus tachycardia on admission, right bundle branch block, rate related changes, PR interval 0.12 QRS axis 10  Time spent: 60 minutes  [facility] Pager [phone]  If 7PM-7AM, please contact night-coverage [email or web address] Password [email or web address] 10/04/2014, 12:41 PM

## 2014-10-04 NOTE — Consult Note (Addendum)
Re:   Jess Toney DOB:   [birth year] MRN:   [id number]   General Surgery H&P  ASSESSMENT AND PLAN: 1.  Hypotension  Has been given IVF, looks good now  Wonder how good his PO intake has been since surgery.  Plan overnight admission, hydration, follow H/H  2.  Anemia, post op  Hgb - 7.6 - 10/04/2014.  His last Hgb on 2/27 was 9.1.  No evidence of acute blood loss  3.  S/P subtotal colectomy - 09/25/2014  For colonic atony/dilated colon  Has been followed by Dr. Olevia Perches 4.  Developmental delay 5.  Schizophrenia 6.  Sarcoidosis Joaquim Lai was not aware of this diagnosis)  No chief complaint on file.  REFERRING PHYSICIAN: Gilles Chiquito, MD  HISTORY OF PRESENT ILLNESS: Luismiguel Lamere is a 49 y.o. (DOB: Nov 08, 1965)  AA  male whose primary care physician is Gilles Chiquito, MD and [facility] the [facility] for weakness and unable to stand.  He was brought by ambulance. His care giver of 21 years, Elpidio Eric, was at the bedside and gave his history.  The patient has had long standing constipation.   He was followed by Dr. Olevia Perches.  Despite different meds for his bowels, he came to significant colonic atony/inertia.  He underwent a subtotal colectomy by Dr. Johney Maine on 09/25/2014.  He was discharged on 3/1. It is unclear how good his PO intake since discharge.  His caregiver said that he would dump his Gatorade down the sink.  He has had 3 to 4 semiformed BM's at home since discharge - no diarrhea. Anyway, he got up this morning to go to the bathroom.  He feel to the floor, but did not lose consciousness.  His legs were weak and wobbly.  Joaquim Lai called the ambulance and he was brought to [facility] ER.  Of note, he is followed by the Internal Med residents at [facility], but his surgery was here.    Past Medical History  Diagnosis Date  . Colonic inertia   . Hyperlipidemia   . Constipation   . Sarcoidosis   . 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 Schizophrenia   . Mental retardation   . Cardiomyopathy     . Glaucoma   . Obstruction of colon     recurrent  . ABSCESS, ANAL/RECTAL REGIONS 08/18/2006    Annotation: Fournier gangrene Qualifier: History of  By: Garnette Scheuermann MD, Arlee Muslim    . 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    Annotation: chronic with stercoral ulcer  Qualifier: Diagnosis of  By: Garnette Scheuermann MD, Arlee Muslim        Past Surgical History  Procedure Laterality Date  . Groin surgery      boil drained  . Colectomy N/A 09/25/2014    Procedure: OPEN ABDOMINAL COLECTOMY;  Surgeon: Michael Boston, MD;  Location: [facility];  Service: General;  Laterality: N/A;  . Proctoscopy N/A 09/25/2014    Procedure: RIGID PROCTOSCOPY;  Surgeon: Michael Boston, MD;  Location: [facility];  Service: General;  Laterality: N/A;      Current Facility-Administered Medications  Medication Dose Route Frequency Provider Last Rate Last Dose  . 0.9 %  sodium chloride infusion   Intravenous Once Tanna Furry, MD       Current Outpatient Prescriptions  Medication Sig Dispense Refill  . cloZAPine (CLOZARIL) 100 MG tablet Take 100 mg by mouth 2 (two) times daily.     . cloZAPine (CLOZARIL) 25 MG tablet Take 25 mg by mouth 2 (two) times daily.    Marland Kitchen  fluvoxaMINE (LUVOX) 100 MG tablet Take 300 mg by mouth at bedtime.    . Multiple Vitamin (MULTIVITAMIN) capsule Take 1 capsule by mouth daily. 30 capsule 11  . naproxen (NAPROSYN) 500 MG tablet Take 1 tablet (500 mg total) by mouth every 12 (twelve) hours as needed for mild pain or moderate pain. 40 tablet 1  . Nutritional Supplements (ENSURE COMPLETE SHAKE) LIQD Take 1 Can by mouth 2 (two) times daily. 60 Bottle 10  . oxyCODONE (OXY IR/ROXICODONE) 5 MG immediate release tablet Take 1-2 tablets (5-10 mg total) by mouth every 4 (four) hours as needed for moderate pain, severe pain or breakthrough pain. 40 tablet 0  . polyethylene glycol (MIRALAX / GLYCOLAX) packet Take 8.5 g by mouth 2 (two) times daily. 14 each 5  . pravastatin (PRAVACHOL) 40 MG tablet TAKE ONE TABLET EACH DAY 30 tablet 6  .  travoprost, benzalkonium, (TRAVATAN) 0.004 % ophthalmic solution Place 1 drop into both eyes at bedtime.        No Known Allergies  REVIEW OF SYSTEMS: Skin:  No history of rash.  No history of abnormal moles. Infection:  No history of hepatitis or HIV.  No history of MRSA. Neurologic:  Developmentally delayed.   Cardiac:  No history of hypertension. No history of heart disease.  No history of prior cardiac catheterization.  No history of seeing a cardiologist. Pulmonary:  Does not smoke cigarettes.  No asthma or bronchitis.  No OSA/CPAP.  Endocrine:  No diabetes. No thyroid disease. Gastrointestinal:  See HPI Urologic:  No history of kidney stones.  No history of bladder infections. Musculoskeletal:  No history of joint or back disease. Hematologic:  No bleeding disorder.  No history of anemia.  Not anticoagulated. Psycho-social:  History of schizophrenia  SOCIAL and FAMILY HISTORY: Lives with Elpidio Eric.  She has been his caregiver for 21 years.  She is not related to him. He has no family who is in contact with him.  PHYSICAL EXAM: BP 83/47 mmHg  Pulse 108  Temp(Src) 98 F (36.7 C) (Oral)  Resp 20  SpO2 98%  General: AA M who is alert.  He can complete only the most simple sentences. HEENT: Normal. Pupils equal. Neck: Supple. No mass.  No thyroid mass. Lymph Nodes:  No supraclavicular or cervical nodes. Lungs: Clear to auscultation and symmetric breath sounds. Heart:  RRR. No murmur or rub. Abdomen: Soft. No mass. No tenderness. No hernia. Normal bowel sounds.  Well healed midline incision. Rectal: Not done. Extremities:  Good strength and ROM  in upper and lower extremities. Neurologic:  Grossly intact to motor and sensory function.   DATA REVIEWED: Epic data  Alphonsa Overall, MD,  [facility], [address]  [place], [place]    [place] Phone:  [phone] FAX:  [phone]

## 2014-10-04 NOTE — ED Notes (Signed)
Bed: WA09 Expected date: 10/04/14 Expected time: 9:41 AM Means of arrival: Ambulance Comments: Hypotension  Syncope x 2

## 2014-10-04 NOTE — Progress Notes (Signed)
1940-order placed for blood after notifying MD of low BP, high HR, and critical Hgb.  2025-lab called to determine if PRBCs ready and RN informed lab system down and did not receive order to prepare PRBCs. No current blood products ready for pt.  Lab tech working in emergent situation and unable to prepare immediately.  RN awaiting return call.

## 2014-10-04 NOTE — Progress Notes (Signed)
CRITICAL VALUE ALERT  Critical value received:  hgb 5.8 Date of notification: 10/04/2014 Time of notification: 1925   Critical value read back:Yes.    Nurse who received alert:  c Lakyia Behe  MD notified (1st page):  D Newman Time of first page:  1933  MD notified (2nd page):  Time of second page:  Responding MD: Nydia Bouton  Time MD responded:  [phone]

## 2014-10-05 ENCOUNTER — Encounter (HOSPITAL_COMMUNITY): Payer: Self-pay | Admitting: Radiology

## 2014-10-05 ENCOUNTER — Encounter: Payer: Self-pay | Admitting: *Deleted

## 2014-10-05 ENCOUNTER — Telehealth: Payer: Self-pay | Admitting: *Deleted

## 2014-10-05 ENCOUNTER — Inpatient Hospital Stay (HOSPITAL_COMMUNITY): Payer: Medicare Other

## 2014-10-05 DIAGNOSIS — I4892 Unspecified atrial flutter: Secondary | ICD-10-CM

## 2014-10-05 DIAGNOSIS — D649 Anemia, unspecified: Secondary | ICD-10-CM

## 2014-10-05 DIAGNOSIS — D638 Anemia in other chronic diseases classified elsewhere: Secondary | ICD-10-CM

## 2014-10-05 LAB — CBC WITH DIFFERENTIAL/PLATELET
Basophils Absolute: 0 10*3/uL (ref 0.0–0.1)
Basophils Relative: 0 % (ref 0–1)
EOS ABS: 0 10*3/uL (ref 0.0–0.7)
Eosinophils Relative: 0 % (ref 0–5)
HCT: 23.5 % — ABNORMAL LOW (ref 39.0–52.0)
Hemoglobin: 7.8 g/dL — ABNORMAL LOW (ref 13.0–17.0)
LYMPHS PCT: 17 % (ref 12–46)
Lymphs Abs: 1.6 10*3/uL (ref 0.7–4.0)
MCH: 29.8 pg (ref 26.0–34.0)
MCHC: 33.2 g/dL (ref 30.0–36.0)
MCV: 89.7 fL (ref 78.0–100.0)
MONO ABS: 0.6 10*3/uL (ref 0.1–1.0)
Monocytes Relative: 6 % (ref 3–12)
NEUTROS ABS: 7.3 10*3/uL (ref 1.7–7.7)
NEUTROS PCT: 77 % (ref 43–77)
Platelets: 239 10*3/uL (ref 150–400)
RBC: 2.62 MIL/uL — ABNORMAL LOW (ref 4.22–5.81)
RDW: 16.9 % — ABNORMAL HIGH (ref 11.5–15.5)
WBC: 9.5 10*3/uL (ref 4.0–10.5)

## 2014-10-05 LAB — BASIC METABOLIC PANEL
Anion gap: 6 (ref 5–15)
BUN: 27 mg/dL — ABNORMAL HIGH (ref 6–23)
CO2: 25 mmol/L (ref 19–32)
Calcium: 7.9 mg/dL — ABNORMAL LOW (ref 8.4–10.5)
Chloride: 109 mmol/L (ref 96–112)
Creatinine, Ser: 0.9 mg/dL (ref 0.50–1.35)
GFR calc Af Amer: >90 mL/min (ref >90)
GFR calc non Af Amer: >90 mL/min (ref >90)
Glucose, Bld: 94 mg/dL (ref 70–99)
Potassium: 4.2 mmol/L (ref 3.5–5.1)
Sodium: 140 mmol/L (ref 135–145)

## 2014-10-05 LAB — CBC
HCT: 18.2 % — ABNORMAL LOW (ref 39.0–52.0)
Hemoglobin: 5.8 g/dL — CL (ref 13.0–17.0)
MCH: 30.2 pg (ref 26.0–34.0)
MCHC: 31.9 g/dL (ref 30.0–36.0)
MCV: 94.8 fL (ref 78.0–100.0)
Platelets: 301 10*3/uL (ref 150–400)
RBC: 1.92 MIL/uL — ABNORMAL LOW (ref 4.22–5.81)
RDW: 17.2 % — ABNORMAL HIGH (ref 11.5–15.5)
WBC: 8.7 10*3/uL (ref 4.0–10.5)

## 2014-10-05 LAB — RETICULOCYTES
RBC.: 3.18 MIL/uL — AB (ref 4.22–5.81)
RETIC COUNT ABSOLUTE: 162.2 10*3/uL (ref 19.0–186.0)
Retic Ct Pct: 5.1 % — ABNORMAL HIGH (ref 0.4–3.1)

## 2014-10-05 LAB — PREPARE RBC (CROSSMATCH)

## 2014-10-05 MED ORDER — ENSURE COMPLETE PO LIQD
237.0000 mL | Freq: Two times a day (BID) | ORAL | Status: DC
  Administered 2014-10-05 – 2014-10-07 (×4): 237 mL via ORAL

## 2014-10-05 MED ORDER — IOHEXOL 300 MG/ML  SOLN
25.0000 mL | INTRAMUSCULAR | Status: AC
Start: 2014-10-05 — End: 2014-10-05

## 2014-10-05 MED ORDER — IOHEXOL 300 MG/ML  SOLN
100.0000 mL | Freq: Once | INTRAMUSCULAR | Status: AC | PRN
  Administered 2014-10-05: 100 mL via INTRAVENOUS

## 2014-10-05 MED ORDER — PRAVASTATIN SODIUM 20 MG PO TABS
20.0000 mg | ORAL_TABLET | Freq: Every day | ORAL | Status: DC
  Administered 2014-10-05 – 2014-10-07 (×3): 20 mg via ORAL
  Filled 2014-10-05 (×3): qty 1

## 2014-10-05 MED ORDER — MULTIVITAMINS PO CAPS: 1.0000 | ORAL_CAPSULE | Freq: Every day | ORAL | Status: DC

## 2014-10-05 MED ORDER — LACTATED RINGERS IV BOLUS (SEPSIS)
1000.0000 mL | Freq: Three times a day (TID) | INTRAVENOUS | Status: AC | PRN
  Administered 2014-10-07: 1000 mL via INTRAVENOUS
  Filled 2014-10-05: qty 1000

## 2014-10-05 MED ORDER — ADULT MULTIVITAMIN W/MINERALS CH
1.0000 | ORAL_TABLET | Freq: Every day | ORAL | Status: DC
  Administered 2014-10-05 – 2014-10-07 (×3): 1 via ORAL
  Filled 2014-10-05 (×3): qty 1

## 2014-10-05 MED ORDER — FERROUS SULFATE 325 (65 FE) MG PO TABS
325.0000 mg | ORAL_TABLET | Freq: Two times a day (BID) | ORAL | Status: DC
  Administered 2014-10-05 – 2014-10-06 (×4): 325 mg via ORAL
  Filled 2014-10-05 (×7): qty 1

## 2014-10-05 MED ORDER — BISMUTH SUBSALICYLATE 262 MG/15ML PO SUSP
30.0000 mL | Freq: Two times a day (BID) | ORAL | Status: DC
  Administered 2014-10-05 – 2014-10-07 (×4): 30 mL via ORAL
  Filled 2014-10-05 (×2): qty 118

## 2014-10-05 NOTE — Telephone Encounter (Signed)
error 

## 2014-10-05 NOTE — Progress Notes (Signed)
[facility]  [address] [place], [place] [address] Phone: [phone] FAX: [phone]    [name] Branch [id number] 1965/12/20  CARE TEAM:  PCP: Gilles Chiquito, MD  Outpatient Care Team: Patient Care Team: [name], MD as PCP - General (Internal Medicine) Lafayette Dragon, MD as Consulting Physician (Gastroenterology)  Inpatient Treatment Team: Treatment Team: Attending Provider: Michael Boston, MD; Consulting Physician: Michael Boston, MD; Consulting Physician: Nolon Nations, MD; Registered Nurse: Roena Malady, RN; Respiratory Therapist: Lowella Curb, RRT; Consulting Physician: Ian Bushman, MD   Subjective:  Denies pain Tolerating some PO Transfused 3 U PRBCs  Objective:  Vital signs:  Filed Vitals:   10/05/14 0400 10/05/14 0500 10/05/14 0600 10/05/14 0700  BP: '89/51 83/47 86/51 ' 90/50  Pulse: 110 117 114 114  Temp: 99.9 F (37.7 C)     TempSrc:      Resp: '18 21 20 20  ' Height:      Weight: 181 lb 7 oz (82.3 kg)     SpO2: 98% 98% 99% 99%       Intake/Output   Yesterday:  03/06 0701 - 03/07 0700 In: 4097 [P.O.:840; I.V.:1485; Blood:1065] Out: -  This shift:     Bowel function:  Flatus: YES  BM: YES  Drain: n/a  Physical Exam:  General: Pt awakens alert, oriented x1 in no acute distress.  Calm, smiling Eyes: PERRL, normal EOM.  Sclera clear.  No icterus Neuro: CN II-XII intact w/o focal sensory/motor deficits. Lymph: No head/neck/groin lymphadenopathy Psych:  No delerium/psychosis/paranoia.  Calm, smiling HENT: Normocephalic, Mucus membranes moist.  No thrush Neck: Supple, No tracheal deviation Chest: No chest wall pain w good excursion CV:  Pulses intact.  Regular rhythm MS: Normal AROM mjr joints.  No obvious deformity Abdomen: Soft.  Mildly distended.  Nontender.  Incision c/d/i.  No evidence of peritonitis.  No incarcerated hernias. Ext:  SCDs BLE.  No mjr edema.  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 male     Procedure(s): OPEN ABDOMINAL COLECTOMY RIGID PROCTOSCOPY  Recovering Severe anemia of uncertain etiology  Plan:  -mech soft diet for now -bowel regimen - hold on Miralax.  Watch out for diarrhea vs recurrent constipation.  Try to get accurate I&Os -inc HR - still with symptomatic anemia - add iron TID.  Consider transfusion again if not better.  D/w Int Med.  Consider Echo since h/o cardiomegaly   -No pain/peritonitis = doubt leak.  Check Xrays.  Follow exam.  CT if worse -MS stable - continue clozapine for schizophrenia & SSRI -VTE prophylaxis- SCDs, etc -mobilize as tolerated to help recovery  I updated the status of the patient to the patient & his RN.  I made recommendations.  I answered questions.  Understanding & appreciation was expressed.    [name], M.D., F.A.C.S. Gastrointestinal and Minimally Invasive Surgery [facility] [address], [place], [place] [address] [phone] Main / Paging   10/05/2014   Results:   Labs: Results for orders placed or performed during the hospital encounter of 10/04/14 (from the past 48 hour(s))  CBC with Differential/Platelet     Status: Abnormal   Collection  Time: 10/04/14 10:08 AM  Result Value Ref Range   WBC 9.3 4.0 - 10.5 K/uL   RBC 2.56 (L) 4.22 - 5.81 MIL/uL   Hemoglobin 7.6 (L) 13.0 - 17.0 g/dL   HCT 24.0 (L) 39.0 - 52.0 %   MCV 93.8 78.0 - 100.0 fL   MCH 29.7 26.0 - 34.0 pg   MCHC 31.7 30.0 - 36.0 g/dL   RDW 16.4 (H) 11.5 - 15.5 %   Platelets 382 150 - 400 K/uL   Neutrophils Relative % 78 (H) 43 - 77 %   Lymphocytes Relative 15 12 - 46 %   Monocytes Relative 7 3 - 12 %    Eosinophils Relative 0 0 - 5 %   Basophils Relative 0 0 - 1 %   Neutro Abs 7.2 1.7 - 7.7 K/uL   Lymphs Abs 1.4 0.7 - 4.0 K/uL   Monocytes Absolute 0.7 0.1 - 1.0 K/uL   Eosinophils Absolute 0.0 0.0 - 0.7 K/uL   Basophils Absolute 0.0 0.0 - 0.1 K/uL   RBC Morphology POLYCHROMASIA PRESENT   Basic metabolic panel     Status: Abnormal   Collection Time: 10/04/14 10:08 AM  Result Value Ref Range   Sodium 137 135 - 145 mmol/L   Potassium 3.6 3.5 - 5.1 mmol/L   Chloride 102 96 - 112 mmol/L   CO2 28 19 - 32 mmol/L   Glucose, Bld 115 (H) 70 - 99 mg/dL   BUN 30 (H) 6 - 23 mg/dL   Creatinine, Ser 1.19 0.50 - 1.35 mg/dL   Calcium 7.9 (L) 8.4 - 10.5 mg/dL   GFR calc non Af Amer 71 (L) >90 mL/min   GFR calc Af Amer 82 (L) >90 mL/min    Comment: (NOTE) The eGFR has been calculated using the CKD EPI equation. This calculation has not been validated in all clinical situations. eGFR's persistently <90 mL/min signify possible Chronic Kidney Disease.    Anion gap 7 5 - 15  POC occult blood, ED     Status: Abnormal   Collection Time: 10/04/14 11:35 AM  Result Value Ref Range   Fecal Occult Bld POSITIVE (A) NEGATIVE  Iron and TIBC     Status: Abnormal   Collection Time: 10/04/14 11:43 AM  Result Value Ref Range   Iron 106 42 - 165 ug/dL   TIBC 132 (L) 215 - 435 ug/dL   Saturation Ratios 80 (H) 20 - 55 %   UIBC 26 (L) 125 - 400 ug/dL    Comment: Performed at [facility]  Vitamin B12     Status: None   Collection Time: 10/04/14 11:43 AM  Result Value Ref Range   Vitamin B-12 575 211 - 911 pg/mL    Comment: Performed at [facility]  Folate     Status: None   Collection Time: 10/04/14 11:43 AM  Result Value Ref Range   Folate 15.0 ng/mL    Comment: (NOTE) Reference Ranges        Deficient:       0.4 - 3.3 ng/mL        Indeterminate:   3.4 - 5.4 ng/mL        Normal:              > 5.4 ng/mL Performed at [facility]   Prepare RBC     Status: None   Collection  Time: 10/04/14 11:52 AM  Result Value Ref Range   Order Confirmation ORDER PROCESSED BY  BLOOD BANK   Type and screen for Red Blood Exchange     Status: None (Preliminary result)   Collection Time: 10/04/14 11:52 AM  Result Value Ref Range   ABO/RH(D) A POS    Antibody Screen NEG    Sample Expiration 10/07/2014    Unit Number [id number]    Blood Component Type RED CELLS,LR    Unit division 00    Status of Unit ISSUED    Transfusion Status OK TO TRANSFUSE    Crossmatch Result Compatible    Unit Number [id number]    Blood Component Type RED CELLS,LR    Unit division 00    Status of Unit ISSUED    Transfusion Status OK TO TRANSFUSE    Crossmatch Result Compatible    Unit Number [id number]    Blood Component Type RED CELLS,LR    Unit division 00    Status of Unit ISSUED    Transfusion Status OK TO TRANSFUSE    Crossmatch Result Compatible    Unit Number [id number]    Blood Component Type RED CELLS,LR    Unit division 00    Status of Unit ALLOCATED    Transfusion Status OK TO TRANSFUSE    Crossmatch Result Compatible    Unit Number [id number]    Blood Component Type RED CELLS,LR    Unit division 00    Status of Unit ALLOCATED    Transfusion Status OK TO TRANSFUSE    Crossmatch Result Compatible   CBC     Status: Abnormal   Collection Time: 10/04/14  6:00 PM  Result Value Ref Range   WBC 8.7 4.0 - 10.5 K/uL   RBC 1.92 (L) 4.22 - 5.81 MIL/uL   Hemoglobin 5.8 (LL) 13.0 - 17.0 g/dL    Comment: REPEATED TO VERIFY CRITICAL RESULT CALLED TO, READ BACK BY AND VERIFIED WITH: AYERS K AT 1925 ON 03.06.2016 BY B HOOKER DELTA CHECK NOTED    HCT 18.2 (L) 39.0 - 52.0 %   MCV 94.8 78.0 - 100.0 fL   MCH 30.2 26.0 - 34.0 pg   MCHC 31.9 30.0 - 36.0 g/dL   RDW 17.2 (H) 11.5 - 15.5 %   Platelets 301 150 - 400 K/uL  Prepare RBC     Status: None   Collection Time: 10/04/14  7:39 PM  Result Value Ref Range   Order Confirmation ORDER PROCESSED BY BLOOD BANK   CBC with  Differential/Platelet     Status: Abnormal   Collection Time: 10/05/14  5:20 AM  Result Value Ref Range   WBC 9.5 4.0 - 10.5 K/uL   RBC 2.62 (L) 4.22 - 5.81 MIL/uL   Hemoglobin 7.8 (L) 13.0 - 17.0 g/dL    Comment: DELTA CHECK NOTED POST TRANSFUSION SPECIMEN    HCT 23.5 (L) 39.0 - 52.0 %   MCV 89.7 78.0 - 100.0 fL   MCH 29.8 26.0 - 34.0 pg   MCHC 33.2 30.0 - 36.0 g/dL   RDW 16.9 (H) 11.5 - 15.5 %   Platelets 239 150 - 400 K/uL   Neutrophils Relative % 77 43 - 77 %   Lymphocytes Relative 17 12 - 46 %   Monocytes Relative 6 3 - 12 %   Eosinophils Relative 0 0 - 5 %   Basophils Relative 0 0 - 1 %   Neutro Abs 7.3 1.7 - 7.7 K/uL   Lymphs Abs 1.6 0.7 - 4.0 K/uL   Monocytes Absolute 0.6 0.1 - 1.0 K/uL   Eosinophils  Absolute 0.0 0.0 - 0.7 K/uL   Basophils Absolute 0.0 0.0 - 0.1 K/uL   RBC Morphology POLYCHROMASIA PRESENT     Comment: RARE NRBCs   WBC Morphology MILD LEFT SHIFT (1-5% METAS, OCC MYELO, OCC BANDS)    Smear Review LARGE PLATELETS PRESENT     Imaging / Studies: No results found.  Medications / Allergies: per chart  Antibiotics: Anti-infectives    None       Note: Portions of this report may have been transcribed using voice recognition software. Every effort was made to ensure accuracy; however, inadvertent computerized transcription errors may be present.   Any transcriptional errors that result from this process are unintentional.

## 2014-10-05 NOTE — Telephone Encounter (Signed)
Jai, thanx. I will work him in As soon as possible. He lost his colon which was hugh ely dilated and now he has only small bowl to take over the water absorption. I would nor be surprised if he does not need a PICC line temporarily For IV hydrartion till the small bowl takes over.    Rollene Fare, can you, please see if he can be seen soon, may need to see an extender.   ----- Message -----

## 2014-10-05 NOTE — Progress Notes (Signed)
  Echocardiogram 2D Echocardiogram has been performed.  [name] Branch 10/05/2014, 3:19 PM

## 2014-10-05 NOTE — Consult Note (Signed)
[name] Hospital[facility] Medical Consultation  Saajan Willmon JOI:[id number] DOB: November 09, 1965 DOA: 10/04/2014 PCP: Gilles Chiquito, MD   Requesting physician: Gen surgery Date of consultation: 10/04/14 Reason for consultation: Hypovolemic shock  Impression/Recommendations   Multi-factorial syncope secondary to volume depletion/acute blood loss anemia -hypovolemic shock his baseline blood pressure at last office visit 09/16/14 was 122/69 with a heart rate of 108-  etiology likely secondary to poor by mouth intake, his guardian and personal lives with him tells me that he is only been eating about half of what he normally would eat-currently dysphagia 3 thickened liquids   BUN/creatinine  12/0.9-->30/1.19 on admission = volume depletion secondary to poor by mouth intake  Anemia blood loss-HB has dropped 1.5 points from 9.1-->7.6--5-->currently 7 received 2 units of packed red blood cells 3/6.-Received 1 more unit 3/7 AM  Pending random a.m. cortisol R/o Addison's process = unlikely. 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 CT scan of the chest 08/03/14 showed tiny nodule in the lung and 8 mm low-density inferior right liver lesion which is thought to be benign  No further workup at this time    Schizophrenia, unspecified type complicated by moderate mental retardation-  careful use of clozapine 125 twice a day, Luvox 300 daily at bedtime-clozapine can cause a little bit of agranulocytosis    Obstructive sleep apnea-he may benefit from use of CPAP at some point     Borderline cardiomyopathy + tachycardia [likely physiological given volume depletion]  unclear etiology. 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 We are happy to consult and assist in any way that we are needed.-Hold off oxycodone 5 for now  Abdominal film is pending from this a.m.  Still having some element of diarrhea   thank you for having [place] anticipated in this patient's care -We will follow patient while hospitalized. I have had a long discussion with his caregiver this morning to explained the plan in terms of abdominal x-ray, echocardiogram  Please contact me directly if I can be of assistance in the meanwhile.   Chief Complaint: fall, hypotension  HPI:  49 y/o ? group home resident h/o Mental retardation/Shizophrenia, hypersomnia c OSA-Isch CM EF 55-60%, Splenomegally secondary to sarcoidosis, prior Fournier's gangrene, ?Sarcoid, colonic inertia + Severe Obstipation [very redundant colon on last Colonoscopy 12/2005--multiple admissions for Fecal impaction/COlonic overflow--it was decided 12/30 that 2/2 to Ct evidence of Hydronephrosi and risk of possible acute kidney injury from overdistended colon that he might need a total colectomy He had a open abdominal colectomy with rigid proctoscope perrformed on 09/25/14 by Dr. gross and was discharged subsequently on 09/30/14 during hospital stay he was appropriately advanced to solid diet and he had mild loose BMs but they were controlled and multiple laxatives were discontinued. He went home and subsequent to this on the fourth had an episode of nausea and subsequent to that episode has not been eating much since then, maybe about 50% of what he normally takes in..  He has been passing stools regularly 3-4 stools a day During the night on 3/5 he had an episode of diarrhea that was nonbloody and  subsequently got up this morning and fell about 3 times. He did not hurt anywhere on his body. His caregiver tells me that when he had his surgery for Fournier's gangrene  in about 2004 he had similar episode with falls and weakness and was fluid repleted the day after surgery and had come back to the hospital for this  In the emergency room he was given 2 L of IV fluids to IVs were placed in either arm and is blood sugar came up from the 41L systolic to close to the 244 systolic.   I can't obtain review of systems directly from patient given his mental retardation  Past Medical History  Diagnosis Date  . Colonic inertia   . Hyperlipidemia   . Constipation   . Sarcoidosis   . 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 Schizophrenia   . Mental retardation   . Cardiomyopathy   . Glaucoma   . Obstruction of colon     recurrent  . ABSCESS, ANAL/RECTAL REGIONS 08/18/2006    Annotation: Fournier gangrene Qualifier: History of  By: Garnette Scheuermann MD, Arlee Muslim    . 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    Annotation: chronic with stercoral ulcer  Qualifier: Diagnosis of  By: Garnette Scheuermann MD, Arlee Muslim     Past Surgical History  Procedure Laterality Date  . Groin surgery      boil drained  . Colectomy N/A 09/25/2014    Procedure: OPEN ABDOMINAL COLECTOMY;  Surgeon: Michael Boston, MD;  Location: [facility];  Service: General;  Laterality: N/A;  . Proctoscopy N/A 09/25/2014    Procedure: RIGID PROCTOSCOPY;  Surgeon: Michael Boston, MD;  Location: [facility];  Service: General;  Laterality: N/A;   Social History:  reports that he has never smoked. He has never used smokeless tobacco. He reports that he does not drink alcohol or use illicit drugs.  No Known Allergies Family History  Problem Relation Age of Onset  . Diabetes Mother   . Colon cancer Neg Hx     Prior to Admission medications   Medication Sig Start Date End Date Taking? Authorizing Provider  cloZAPine (CLOZARIL) 100 MG tablet Take 100 mg by mouth 2 (two) times daily.    Yes Historical Provider, MD  cloZAPine (CLOZARIL) 25 MG tablet Take 25 mg by mouth 2 (two) times daily.   Yes Historical Provider, MD  fluvoxaMINE (LUVOX) 100 MG tablet Take  300 mg by mouth at bedtime.   Yes Historical Provider, MD  Multiple Vitamin (MULTIVITAMIN) capsule Take 1 capsule by mouth daily. 09/16/14  Yes [name], MD  naproxen (NAPROSYN) 500 MG tablet Take 1 tablet (500 mg total) by mouth every 12 (twelve) hours as needed for mild pain or moderate pain. 09/30/14  Yes Michael Boston, MD  Nutritional Supplements (ENSURE COMPLETE SHAKE) LIQD Take 1 Can by mouth 2 (two) times daily. 07/29/14  Yes Lafayette Dragon, MD  oxyCODONE (OXY IR/ROXICODONE) 5 MG immediate release tablet Take 1-2 tablets (5-10 mg total) by mouth every 4 (four) hours as needed for moderate pain, severe pain or breakthrough pain. 09/25/14  Yes Michael Boston, MD  polyethylene glycol (MIRALAX / GLYCOLAX) packet Take 8.5 g by mouth 2 (two) times daily. 09/29/14  Yes Michael Boston, MD  pravastatin (PRAVACHOL) 40 MG tablet TAKE ONE TABLET EACH DAY 08/28/14  Yes [name], MD  travoprost, benzalkonium, (TRAVATAN) 0.004 % ophthalmic solution Place 1 drop into both eyes at bedtime.    Yes Historical Provider, MD   Physical  Exam: Blood pressure 90/50, pulse 114, temperature 99.9 F (37.7 C), temperature source Oral, resp. rate 20, height 6\' 2"  (1.88 m), weight 82.3 kg (181 lb 7 oz), SpO2 99 %. Filed Vitals:   10/05/14 0700  BP: 90/50  Pulse: 114  Temp:   Resp: 20   Monosyllable disheveled EOMI no, no pallor Abdomen soft benign no rebound no guarding midline scar very well-healed by primary intention, distended and upper abdominal quadrant no rebound no guarding midline scar moderate dentition,? JVD left side Neuro is Grossly intact moving all 4 limbs equally without issue.  Labs on Admission:  Basic Metabolic Panel:  Recent Labs Lab 10/04/14 1008  NA 137  K 3.6  CL 102  CO2 28  GLUCOSE 115*  BUN 30*  CREATININE 1.19  CALCIUM 7.9*   Liver Function Tests: No results for input(s): AST, ALT, ALKPHOS, BILITOT, PROT, ALBUMIN in the last 168 hours. No results for input(s): LIPASE,  AMYLASE in the last 168 hours. No results for input(s): AMMONIA in the last 168 hours. CBC:  Recent Labs Lab 10/04/14 1008 10/04/14 1800 10/05/14 0520  WBC 9.3 8.7 9.5  NEUTROABS 7.2  --  7.3  HGB 7.6* 5.8* 7.8*  HCT 24.0* 18.2* 23.5*  MCV 93.8 94.8 89.7  PLT 382 301 239   Cardiac Enzymes: No results for input(s): CKTOTAL, CKMB, CKMBINDEX, TROPONINI in the last 168 hours. BNP: Invalid input(s): POCBNP CBG: No results for input(s): GLUCAP in the last 168 hours.  Radiological Exams on Admission: No results found.  EKG: Independently reviewed. sinus tachycardia on admission, right bundle branch block, rate related changes, PR interval 0.12 QRS axis 10  Time spent: 35 minutes [facility] [place] [facility] Pager [phone]  If 7PM-7AM, please contact night-coverage [email or web address] Password [facility] 10/05/2014, 8:28 AM

## 2014-10-05 NOTE — Telephone Encounter (Signed)
Letter mailed with [facility] on 10/20/14 at 2:30 PM with Dr. Olevia Perches.

## 2014-10-05 NOTE — Progress Notes (Addendum)
[name] Branch  [birth year] [id number]  Patient Care Team: [name], MD as PCP - General (Internal Medicine) Lafayette Dragon, MD as Consulting Physician (Gastroenterology)  Dr Owens Shark with Radiology concerned with free air and dilated small bowel on plain film.  CT scan ordered.  Moderate free fluid noted.  Thinner than hematoma/blood/stool.   I wonder if it is ascites.  Some free air under diaphragms only.  No free air or loculations down at the ileorectal anastomosis.  That makes the idea of a leak less likely in my mind.  Could consider enema if suspicion stronger.  Contrast really not down there.  May repeat films in morning.  Reviewed with Dr. Ardeen Garland with radiology.  Suspect he has an ileus and is rather malnourished.  We will check LFTs and lipase with morning labs.  I very skeptical that he has a leak given the fact he has no peritonitis and his tachycardia has resolved after blood transfusion and he does not look toxic.  No N/V.  Mentally calm & no delerium/sedation/agitation.  We will follow closely.    Patient Active Problem List   Diagnosis Date Noted  . Anemia of chronic disease 10/05/2014  . Postoperative anemia 10/05/2014  . Shock 10/04/2014  . Colonic inertia s/p abdominal colectomy 09/25/2014 09/25/2014  . Hyperlipidemia 09/17/2008  . GLAUCOMA NOS 10/10/2006  . Obstructive sleep apnea 08/18/2006  . Essential hypertension 08/18/2006  . Sarcoidosis 06/11/2006  . Schizophrenia, unspecified type 06/11/2006  . Mental retardation 06/11/2006  . CARDIOMYOPATHY 06/11/2006  . 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  . SPLENOMEGALY 06/11/2006    Past Medical History  Diagnosis Date  . Colonic inertia   . Hyperlipidemia   . Constipation   . Sarcoidosis   . 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 Schizophrenia   . Mental retardation   . Cardiomyopathy   . Glaucoma   . Obstruction of colon     recurrent  . ABSCESS, ANAL/RECTAL REGIONS 08/18/2006    Annotation: Fournier gangrene Qualifier:  History of  By: Garnette Scheuermann MD, Arlee Muslim    . 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    Annotation: chronic with stercoral ulcer  Qualifier: Diagnosis of  By: Garnette Scheuermann MD, Arlee Muslim      Past Surgical History  Procedure Laterality Date  . Groin surgery      boil drained  . Colectomy N/A 09/25/2014    Procedure: OPEN ABDOMINAL COLECTOMY;  Surgeon: Michael Boston, MD;  Location: [facility];  Service: General;  Laterality: N/A;  . Proctoscopy N/A 09/25/2014    Procedure: RIGID PROCTOSCOPY;  Surgeon: Michael Boston, MD;  Location: [facility];  Service: General;  Laterality: N/A;    History   Social History  . Marital Status: Single    Spouse Name: N/A  . Number of Children: 0  . Years of Education: N/A   Occupational History  .     Social History Main Topics  . Smoking status: Never Smoker   . Smokeless tobacco: Never Used  . Alcohol Use: No  . Drug Use: No  . Sexual Activity: Not on file     Comment:     Other Topics Concern  . Not on file   Social History Narrative   Has a caretaker    Family History  Problem Relation Age of Onset  . Diabetes Mother   . Colon cancer Neg Hx     Current Facility-Administered Medications  Medication Dose Route Frequency Provider Last Rate Last Dose  . acetaminophen (TYLENOL) tablet 650  mg  650 mg Oral Q6H PRN Alphonsa Overall, MD      . bismuth subsalicylate (PEPTO BISMOL) 262 MG/15ML suspension 30 mL  30 mL Oral BID Michael Boston, MD   30 mL at 10/05/14 0900  . cloZAPine (CLOZARIL) tablet 125 mg  125 mg Oral BID Michael Boston, MD   125 mg at 10/05/14 0945  . dextrose 5 % and 0.45 % NaCl with KCl 20 mEq/L infusion   Intravenous Continuous Michael Boston, MD 75 mL/hr at 10/05/14 0830    . feeding supplement (ENSURE COMPLETE) (ENSURE COMPLETE) liquid 237 mL  237 mL Oral BID BM Michael Boston, MD   237 mL at 10/05/14 0945  . ferrous sulfate tablet 325 mg  325 mg Oral BID WC Michael Boston, MD   325 mg at 10/05/14 0945  . fluvoxaMINE (LUVOX) tablet 300 mg  300 mg Oral QHS Alphonsa Overall,  MD   300 mg at 10/04/14 2114  . HYDROcodone-acetaminophen (NORCO/VICODIN) 5-325 MG per tablet 1-2 tablet  1-2 tablet Oral Q4H PRN Alphonsa Overall, MD      . lactated ringers bolus 1,000 mL  1,000 mL Intravenous Q8H PRN Michael Boston, MD      . latanoprost (XALATAN) 0.005 % ophthalmic solution 1 drop  1 drop Both Eyes QHS Alphonsa Overall, MD   1 drop at 10/04/14 2139  . multivitamin with minerals tablet 1 tablet  1 tablet Oral Daily Michael Boston, MD   1 tablet at 10/05/14 0945  . pravastatin (PRAVACHOL) tablet 20 mg  20 mg Oral Daily Michael Boston, MD   20 mg at 10/05/14 0945     No Known Allergies  BP 93/52 mmHg  Pulse 97  Temp(Src) 98.5 F (36.9 C) (Oral)  Resp 19  Ht 6\' 2"  (1.88 m)  Wt 181 lb 7 oz (82.3 kg)  BMI 23.29 kg/m2  SpO2 100%  Dg Abd Acute W/chest  10/05/2014   CLINICAL DATA:  Open abdominal colectomy 09/25/2014. Abdominal pain and distention.  EXAM: ACUTE ABDOMEN SERIES (ABDOMEN 2 VIEW & CHEST 1 VIEW)  COMPARISON:  CT of the abdomen and pelvis 08/03/2014  FINDINGS: Heart size is normal. There is infiltrate within the right upper lobe. No pulmonary edema.  There is free intraperitoneal air beneath the diaphragm. Marked dilatation of small bowel loops is consistent with small bowel obstruction. There is paucity of bowel gas in the pelvis, raising the question of abscess or collapsed loops in the pelvis.  IMPRESSION: 1. Right upper lobe infiltrate.  Suspect infectious process. 2. Moderate free intraperitoneal air. 3. High-grade small bowel obstruction. Consider further evaluation is CT of the abdomen and pelvis with contrast. 4. The salient findings were discussed with [name] Branch on 10/05/2014 at 11:42 am.   Electronically Signed   By: Nolon Nations M.D.   On: 10/05/2014 11:56    Note: This dictation was prepared with Dragon/digital dictation along with [employer]. Any transcriptional errors that result from this process are unintentional.

## 2014-10-05 NOTE — Telephone Encounter (Signed)
-----   Message from Lafayette Dragon, MD sent at 10/05/2014 12:53 PM EST ----- Yes. I talked to his doctors and he will be going home in next dew days. OV about 2 weeks. Thanks ----- Message -----    From: Hulan Saas, RN    Sent: 10/05/2014  10:57 AM      To: Lafayette Dragon, MD  Dr. Olevia Perches, You sent me a note to schedule this patient ASAP. He is inpatient at this time. Rollene Fare

## 2014-10-05 NOTE — Telephone Encounter (Signed)
Left a message for Ms. Payton Mccallum to call back.

## 2014-10-06 DIAGNOSIS — R579 Shock, unspecified: Secondary | ICD-10-CM

## 2014-10-06 LAB — BASIC METABOLIC PANEL
Anion gap: 3 — ABNORMAL LOW (ref 5–15)
BUN: 13 mg/dL (ref 6–23)
CO2: 25 mmol/L (ref 19–32)
Calcium: 7.7 mg/dL — ABNORMAL LOW (ref 8.4–10.5)
Chloride: 109 mmol/L (ref 96–112)
Creatinine, Ser: 0.65 mg/dL (ref 0.50–1.35)
GFR calc Af Amer: >90 mL/min (ref >90)
GFR calc non Af Amer: >90 mL/min (ref >90)
Glucose, Bld: 102 mg/dL — ABNORMAL HIGH (ref 70–99)
POTASSIUM: 3.9 mmol/L (ref 3.5–5.1)
Sodium: 137 mmol/L (ref 135–145)

## 2014-10-06 LAB — LIPASE, BLOOD: LIPASE: 46 U/L (ref 11–59)

## 2014-10-06 LAB — HEPATIC FUNCTION PANEL
ALBUMIN: 2.4 g/dL — AB (ref 3.5–5.2)
ALT: 25 U/L (ref 0–53)
AST: 22 U/L (ref 0–37)
Alkaline Phosphatase: 46 U/L (ref 39–117)
BILIRUBIN INDIRECT: 0.3 mg/dL (ref 0.3–0.9)
BILIRUBIN TOTAL: 0.4 mg/dL (ref 0.3–1.2)
Bilirubin, Direct: 0.1 mg/dL (ref 0.0–0.5)
TOTAL PROTEIN: 4.8 g/dL — AB (ref 6.0–8.3)

## 2014-10-06 LAB — HEMOGLOBIN: Hemoglobin: 8.2 g/dL — ABNORMAL LOW (ref 13.0–17.0)

## 2014-10-06 LAB — CORTISOL-AM, BLOOD: Cortisol - AM: 11.3 ug/dL (ref 4.3–22.4)

## 2014-10-06 MED ORDER — SACCHAROMYCES BOULARDII 250 MG PO CAPS
250.0000 mg | ORAL_CAPSULE | Freq: Two times a day (BID) | ORAL | Status: DC
  Administered 2014-10-06 – 2014-10-07 (×3): 250 mg via ORAL
  Filled 2014-10-06 (×5): qty 1

## 2014-10-06 MED ORDER — BISMUTH SUBSALICYLATE 262 MG/15ML PO SUSP
30.0000 mL | Freq: Three times a day (TID) | ORAL | Status: DC | PRN
  Administered 2014-10-06: 30 mL via ORAL
  Filled 2014-10-06: qty 118

## 2014-10-06 NOTE — Telephone Encounter (Signed)
Unable to reach [name] x 3 attempts.

## 2014-10-06 NOTE — Progress Notes (Signed)
[facility]  [address] [place], [place] [address] Phone: [phone] FAX: [phone]    Cove Haydon [id number] 07-26-1966  CARE TEAM:  PCP: Gilles Chiquito, MD  Outpatient Care Team: Patient Care Team: [name], MD as PCP - General (Internal Medicine) Lafayette Dragon, MD as Consulting Physician (Gastroenterology)  Inpatient Treatment Team: Treatment Team: Attending Provider: Michael Boston, MD; Consulting Physician: Michael Boston, MD; Registered Nurse: Roena Malady, RN; Consulting Physician: Ian Bushman, MD   Subjective:  "Can I go home?" Denies pain Tolerating PO well Caregiver in room  Objective:  Vital signs:  Filed Vitals:   10/05/14 2100 10/05/14 2200 10/06/14 0000 10/06/14 0400  BP: 92/55 98/57 105/62 106/54  Pulse: 95 91 91 103  Temp:   98.3 F (36.8 C) 98.6 F (37 C)  TempSrc:   Oral Oral  Resp: '16 15 17 17  ' Height:      Weight:    178 lb 9.2 oz (81 kg)  SpO2: 100% 100% 100% 100%    Last BM Date: 10/05/14  Intake/Output   Yesterday:  03/07 0701 - 03/08 0700 In: 2317.5 [P.O.:480; I.V.:1837.5] Out: 6 [Urine:3; Stool:3] This shift:     Bowel function:  Flatus: YES  BM: YES  Drain: n/a  Physical Exam:  General: Pt awakens alert, oriented x1 in no acute distress.  Calm, smiling Eyes: PERRL, normal EOM.  Sclera clear.  No icterus Neuro: CN II-XII intact w/o focal sensory/motor deficits. Lymph: No head/neck/groin lymphadenopathy Psych:  No delerium/psychosis/paranoia.  Calm, smiling HENT: Normocephalic, Mucus membranes moist.  No thrush Neck: Supple, No tracheal deviation Chest: No chest wall pain w good excursion CV:  Pulses intact.  Regular rhythm MS: Normal AROM mjr joints.  No obvious deformity Abdomen: Soft.  Mildly distended.  Nontender.  Incision c/d/i.  No evidence of peritonitis.  No pain with cough, bedshake, palpation, percussion.  No incarcerated hernias. Ext:  SCDs BLE.   No mjr edema.  No cyanosis Skin: No petechiae / purpura  Study Conclusions  - Left ventricle: The cavity size was mildly dilated. Systolic function was mildly to moderately reduced. The estimated ejection fraction was in the range of 40% to 45%. Wall motion was normal; there were no regional wall motion abnormalities. Doppler parameters are consistent with abnormal left ventricular relaxation (grade 1 diastolic dysfunction). There was no evidence of elevated ventricular filling pressure by Doppler parameters. - Aortic valve: Trileaflet; normal thickness leaflets. There was moderate regurgitation. - Mitral valve: Structurally normal valve. There was trivial regurgitation. - Left atrium: The atrium was normal in size. - Right ventricle: The cavity size was severely dilated. Wall thickness was normal. Systolic function was normal. - Right atrium: The atrium was moderately dilated. - Tricuspid valve: There was mild regurgitation. - Pulmonic valve: There was no regurgitation. - Pulmonary arteries: Systolic pressure was within the normal range. - Pericardium, extracardiac: There was no pericardial effusion.  Transthoracic echocardiography. M-mode, complete 2D, spectral Doppler, and color Doppler. Birthdate: Patient birthdate: 1965-10-16. Age: Patient is 49 yr old. Sex: Gender: male. BMI: 23.2 kg/m^2. Blood pressure:   90/50 Patient status: Inpatient. Study date: Study date: 10/05/2014. Study time: 02:38 PM. Location: ICU/CCU  Problem List:   Principal Problem:   Shock Active Problems:   Sarcoidosis   Schizophrenia, unspecified type   Obstructive sleep apnea   Neurogenic bowel   Colonic inertia s/p abdominal colectomy 09/25/2014   Anemia of chronic disease   Postoperative anemia   Assessment  [name] Branch  49 y.o. male     Procedure(s): OPEN ABDOMINAL COLECTOMY RIGID PROCTOSCOPY  Recovering.  severe anemia of uncertain  etiology  Plan:  -transfer to floor -adv diet -follow off IVF -bowel regimen - hold on Miralax.  Watch out for diarrhea vs recurrent constipation.  Pepto PRN  Try to get accurate I&Os -inc HR borderline - still with symptomatic anemia - add iron TID.  Consider transfusion again if not better.  D/w  -Echo with RV cardiomegaly but good LVEF   -No pain/peritonitis = doubt leak.  Follow exam. -MS stable - continue clozapine for schizophrenia & SSRI -VTE prophylaxis- SCDs, etc -mobilize as tolerated to help recovery  I updated the status of the patient to the patient, his caregiver, & his RN.  I made recommendations.  I answered questions.  Understanding & appreciation was expressed.    [name], M.D., F.A.C.S. Gastrointestinal and Minimally Invasive Surgery [facility] [address] [place], [place] [address] [phone] Main / Paging   10/06/2014   Results:   Labs: Results for orders placed or performed during the hospital encounter of 10/04/14 (from the past 48 hour(s))  CBC with Differential/Platelet     Status: Abnormal   Collection Time: 10/04/14 10:08 AM  Result Value Ref Range   WBC 9.3 4.0 - 10.5 K/uL   RBC 2.56 (L) 4.22 - 5.81 MIL/uL   Hemoglobin 7.6 (L) 13.0 - 17.0 g/dL   HCT 24.0 (L) 39.0 - 52.0 %   MCV 93.8 78.0 - 100.0 fL   MCH 29.7 26.0 - 34.0 pg   MCHC 31.7 30.0 - 36.0 g/dL   RDW 16.4 (H) 11.5 - 15.5 %   Platelets 382 150 - 400 K/uL   Neutrophils Relative % 78 (H) 43 - 77 %   Lymphocytes Relative 15 12 - 46 %   Monocytes Relative 7 3 - 12 %   Eosinophils Relative 0 0 - 5 %   Basophils Relative 0 0 - 1 %   Neutro Abs 7.2 1.7 - 7.7 K/uL   Lymphs Abs 1.4 0.7 - 4.0 K/uL   Monocytes Absolute 0.7 0.1 - 1.0 K/uL   Eosinophils Absolute 0.0 0.0 - 0.7 K/uL   Basophils Absolute 0.0 0.0 - 0.1 K/uL   RBC Morphology POLYCHROMASIA PRESENT   Basic metabolic panel     Status: Abnormal   Collection Time: 10/04/14 10:08 AM   Result Value Ref Range   Sodium 137 135 - 145 mmol/L   Potassium 3.6 3.5 - 5.1 mmol/L   Chloride 102 96 - 112 mmol/L   CO2 28 19 - 32 mmol/L   Glucose, Bld 115 (H) 70 - 99 mg/dL   BUN 30 (H) 6 - 23 mg/dL   Creatinine, Ser 1.19 0.50 - 1.35 mg/dL   Calcium 7.9 (L) 8.4 - 10.5 mg/dL   GFR calc non Af Amer 71 (L) >90 mL/min   GFR calc Af Amer 82 (L) >90 mL/min    Comment: (NOTE) The eGFR has been calculated using the CKD EPI equation. This calculation has not been validated in all clinical situations. eGFR's persistently <90 mL/min signify possible Chronic Kidney Disease.    Anion gap 7 5 - 15  POC occult blood, ED     Status: Abnormal   Collection Time: 10/04/14 11:35 AM  Result Value Ref Range   Fecal Occult Bld POSITIVE (A) NEGATIVE  Iron and TIBC     Status: Abnormal  Collection Time: 10/04/14 11:43 AM  Result Value Ref Range   Iron 106 42 - 165 ug/dL   TIBC 132 (L) 215 - 435 ug/dL   Saturation Ratios 80 (H) 20 - 55 %   UIBC 26 (L) 125 - 400 ug/dL    Comment: Performed at [facility]  Vitamin B12     Status: None   Collection Time: 10/04/14 11:43 AM  Result Value Ref Range   Vitamin B-12 575 211 - 911 pg/mL    Comment: Performed at [facility]  Folate     Status: None   Collection Time: 10/04/14 11:43 AM  Result Value Ref Range   Folate 15.0 ng/mL    Comment: (NOTE) Reference Ranges        Deficient:       0.4 - 3.3 ng/mL        Indeterminate:   3.4 - 5.4 ng/mL        Normal:              > 5.4 ng/mL Performed at [facility]   Prepare RBC     Status: None   Collection Time: 10/04/14 11:52 AM  Result Value Ref Range   Order Confirmation ORDER PROCESSED BY BLOOD BANK   Type and screen for Red Blood Exchange     Status: None (Preliminary result)   Collection Time: 10/04/14 11:52 AM  Result Value Ref Range   ABO/RH(D) A POS    Antibody Screen NEG    Sample Expiration 10/07/2014    Unit Number [id number]    Blood Component Type RED  CELLS,LR    Unit division 00    Status of Unit ISSUED,FINAL    Transfusion Status OK TO TRANSFUSE    Crossmatch Result Compatible    Unit Number [id number]    Blood Component Type RED CELLS,LR    Unit division 00    Status of Unit ISSUED,FINAL    Transfusion Status OK TO TRANSFUSE    Crossmatch Result Compatible    Unit Number [id number]    Blood Component Type RED CELLS,LR    Unit division 00    Status of Unit ISSUED,FINAL    Transfusion Status OK TO TRANSFUSE    Crossmatch Result Compatible    Unit Number [id number]    Blood Component Type RED CELLS,LR    Unit division 00    Status of Unit ALLOCATED    Transfusion Status OK TO TRANSFUSE    Crossmatch Result Compatible    Unit Number [id number]    Blood Component Type RED CELLS,LR    Unit division 00    Status of Unit ALLOCATED    Transfusion Status OK TO TRANSFUSE    Crossmatch Result Compatible   CBC     Status: Abnormal   Collection Time: 10/04/14  6:00 PM  Result Value Ref Range   WBC 8.7 4.0 - 10.5 K/uL   RBC 1.92 (L) 4.22 - 5.81 MIL/uL   Hemoglobin 5.8 (LL) 13.0 - 17.0 g/dL    Comment: REPEATED TO VERIFY CRITICAL RESULT CALLED TO, READ BACK BY AND VERIFIED WITH: AYERS K AT 1925 ON 03.06.2016 BY B HOOKER DELTA CHECK NOTED    HCT 18.2 (L) 39.0 - 52.0 %   MCV 94.8 78.0 - 100.0 fL   MCH 30.2 26.0 - 34.0 pg   MCHC 31.9 30.0 - 36.0 g/dL   RDW 17.2 (H) 11.5 - 15.5 %   Platelets 301 150 - 400 K/uL  Prepare  RBC     Status: None   Collection Time: 10/04/14  7:39 PM  Result Value Ref Range   Order Confirmation ORDER PROCESSED BY BLOOD BANK   CBC with Differential/Platelet     Status: Abnormal   Collection Time: 10/05/14  5:20 AM  Result Value Ref Range   WBC 9.5 4.0 - 10.5 K/uL   RBC 2.62 (L) 4.22 - 5.81 MIL/uL   Hemoglobin 7.8 (L) 13.0 - 17.0 g/dL    Comment: DELTA CHECK NOTED POST TRANSFUSION SPECIMEN    HCT 23.5 (L) 39.0 - 52.0 %   MCV 89.7 78.0 - 100.0 fL   MCH 29.8 26.0 - 34.0 pg   MCHC 33.2  30.0 - 36.0 g/dL   RDW 16.9 (H) 11.5 - 15.5 %   Platelets 239 150 - 400 K/uL   Neutrophils Relative % 77 43 - 77 %   Lymphocytes Relative 17 12 - 46 %   Monocytes Relative 6 3 - 12 %   Eosinophils Relative 0 0 - 5 %   Basophils Relative 0 0 - 1 %   Neutro Abs 7.3 1.7 - 7.7 K/uL   Lymphs Abs 1.6 0.7 - 4.0 K/uL   Monocytes Absolute 0.6 0.1 - 1.0 K/uL   Eosinophils Absolute 0.0 0.0 - 0.7 K/uL   Basophils Absolute 0.0 0.0 - 0.1 K/uL   RBC Morphology POLYCHROMASIA PRESENT     Comment: RARE NRBCs   WBC Morphology MILD LEFT SHIFT (1-5% METAS, OCC MYELO, OCC BANDS)    Smear Review LARGE PLATELETS PRESENT   Cortisol-am, blood     Status: None   Collection Time: 10/05/14  5:20 AM  Result Value Ref Range   Cortisol - AM 11.3 4.3 - 22.4 ug/dL    Comment: Performed at [facility]  Reticulocytes     Status: Abnormal   Collection Time: 10/05/14  9:30 AM  Result Value Ref Range   Retic Ct Pct 5.1 (H) 0.4 - 3.1 %   RBC. 3.18 (L) 4.22 - 5.81 MIL/uL   Retic Count, Manual 162.2 19.0 - 186.0 K/uL  Basic metabolic panel     Status: Abnormal   Collection Time: 10/05/14  9:30 AM  Result Value Ref Range   Sodium 140 135 - 145 mmol/L   Potassium 4.2 3.5 - 5.1 mmol/L   Chloride 109 96 - 112 mmol/L   CO2 25 19 - 32 mmol/L   Glucose, Bld 94 70 - 99 mg/dL   BUN 27 (H) 6 - 23 mg/dL   Creatinine, Ser 0.90 0.50 - 1.35 mg/dL   Calcium 7.9 (L) 8.4 - 10.5 mg/dL   GFR calc non Af Amer >90 >90 mL/min   GFR calc Af Amer >90 >90 mL/min    Comment: (NOTE) The eGFR has been calculated using the CKD EPI equation. This calculation has not been validated in all clinical situations. eGFR's persistently <90 mL/min signify possible Chronic Kidney Disease.    Anion gap 6 5 - 15  Basic metabolic panel     Status: Abnormal   Collection Time: 10/06/14  3:40 AM  Result Value Ref Range   Sodium 137 135 - 145 mmol/L   Potassium 3.9 3.5 - 5.1 mmol/L   Chloride 109 96 - 112 mmol/L   CO2 25 19 - 32 mmol/L    Glucose, Bld 102 (H) 70 - 99 mg/dL   BUN 13 6 - 23 mg/dL    Comment: DELTA CHECK NOTED REPEATED TO VERIFY    Creatinine,  Ser 0.65 0.50 - 1.35 mg/dL   Calcium 7.7 (L) 8.4 - 10.5 mg/dL   GFR calc non Af Amer >90 >90 mL/min   GFR calc Af Amer >90 >90 mL/min    Comment: (NOTE) The eGFR has been calculated using the CKD EPI equation. This calculation has not been validated in all clinical situations. eGFR's persistently <90 mL/min signify possible Chronic Kidney Disease.    Anion gap 3 (L) 5 - 15  Hemoglobin     Status: Abnormal   Collection Time: 10/06/14  3:40 AM  Result Value Ref Range   Hemoglobin 8.2 (L) 13.0 - 17.0 g/dL  Hepatic function panel     Status: Abnormal   Collection Time: 10/06/14  3:40 AM  Result Value Ref Range   Total Protein 4.8 (L) 6.0 - 8.3 g/dL   Albumin 2.4 (L) 3.5 - 5.2 g/dL   AST 22 0 - 37 U/L   ALT 25 0 - 53 U/L   Alkaline Phosphatase 46 39 - 117 U/L   Total Bilirubin 0.4 0.3 - 1.2 mg/dL   Bilirubin, Direct 0.1 0.0 - 0.5 mg/dL   Indirect Bilirubin 0.3 0.3 - 0.9 mg/dL  Lipase, blood     Status: None   Collection Time: 10/06/14  3:40 AM  Result Value Ref Range   Lipase 46 11 - 59 U/L    Imaging / Studies: Ct Abdomen Pelvis W Contrast  10/05/2014   CLINICAL DATA:  Abdominal distention. Colectomy 9 days ago. Pneumoperitoneum on plain films.  EXAM: CT ABDOMEN AND PELVIS WITH CONTRAST  TECHNIQUE: Multidetector CT imaging of the abdomen and pelvis was performed using the standard protocol following bolus administration of intravenous contrast.  CONTRAST:  160m OMNIPAQUE IOHEXOL 300 MG/ML  SOLN  COMPARISON:  Plain films 10/05/2014.  CT 08/03/2014.  FINDINGS: Minimal bibasilar atelectasis.  No effusions.  Heart is normal size.  There is pneumoperitoneum. Moderate ascites in the abdomen and pelvis. Patient is status post colectomy. Ileo rectal anastomosis is noted in the lower pelvis. No localized extraluminal gas noted in the region of the anastomosis.  Small  bowel is dilated diffusely to the anastomosis, presumably ileus.  Gallbladder is contracted. Liver, spleen, pancreas, adrenals are unremarkable. There is mild to moderate bilateral hydronephrosis, stable since prior study. Urinary bladder wall appears mildly thickened. Aorta is normal caliber.  No acute bony abnormality or focal bone lesion.  IMPRESSION: Mild-to-moderate pneumoperitoneum and moderate ascites in the abdomen and pelvis. While this could be related to recent postoperative state, it is difficult to completely exclude dehiscence at the anastomosis although no localize sign of extraluminal gas noted at the anastomosis.  Diffuse small bowel dilatation, likely ileus.   Electronically Signed   By: KRolm BaptiseM.D.   On: 10/05/2014 16:53   Dg Abd Acute W/chest  10/05/2014   CLINICAL DATA:  Open abdominal colectomy 09/25/2014. Abdominal pain and distention.  EXAM: ACUTE ABDOMEN SERIES (ABDOMEN 2 VIEW & CHEST 1 VIEW)  COMPARISON:  CT of the abdomen and pelvis 08/03/2014  FINDINGS: Heart size is normal. There is infiltrate within the right upper lobe. No pulmonary edema.  There is free intraperitoneal air beneath the diaphragm. Marked dilatation of small bowel loops is consistent with small bowel obstruction. There is paucity of bowel gas in the pelvis, raising the question of abscess or collapsed loops in the pelvis.  IMPRESSION: 1. Right upper lobe infiltrate.  Suspect infectious process. 2. Moderate free intraperitoneal air. 3. High-grade small bowel obstruction. Consider further evaluation  is CT of the abdomen and pelvis with contrast. 4. The salient findings were discussed with Makenli Derstine on 10/05/2014 at 11:42 am.   Electronically Signed   By: Nolon Nations M.D.   On: 10/05/2014 11:56    Medications / Allergies: per chart  Antibiotics: Anti-infectives    None       Note: Portions of this report may have been transcribed using voice recognition software. Every effort was made to ensure  accuracy; however, inadvertent computerized transcription errors may be present.   Any transcriptional errors that result from this process are unintentional.

## 2014-10-06 NOTE — Consult Note (Signed)
[name] Medical Consultation  [name] Branch PYK:[id number] DOB: [birth year] DOA: 10/04/2014 PCP: Gilles Chiquito, MD   Requesting physician: Gen surgery Date of consultation: 10/04/14 Reason for consultation: Hypovolemic shock  Impression/Recommendations   Multi-factorial syncope secondary to volume depletion/acute blood loss anemia -hypovolemic shock his baseline blood pressure at last office visit 09/16/14 was 122/69 with a heart rate of 108-  etiology likely secondary to poor by mouth intake, his guardian and personal lives with him tells me that he is only been eating about half of what he normally would eat-currently dysphagia 3 thickened liquids   BUN/creatinine  12/0.9-->30/1.19 on admission = volume depletion secondary to poor by mouth intake current values = 13/0.65  Anemia blood loss-HB has dropped 1.5 points from 9.1-->7.6--5-->currently 7 received 2 units of packed red blood cells 3/6.-Received 1 more unit 3/7 AM  Current hemoglobin is 8.2  Random a.m. 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 CT scan of the chest 08/03/14 showed tiny nodule in the lung and 8 mm low-density inferior right liver lesion which is thought to be benign  No further workup at this time    Schizophrenia, unspecified type complicated by moderate mental retardation-  careful use of clozapine 125 twice a day-DR. GROSS-this can occasionally cause anticholinergic effects  Continue Luvox 300 daily at bedtime    Obstructive sleep apnea-he may benefit from use of CPAP at some point if he is willing to comply     Borderline cardiomyopathy + tachycardia [likely physiological given volume depletion]  unclear etiology but is resolving reasonably well.   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 We are happy to consult and assist in any way that we are needed.-Hold off oxycodone 5 for now  Per general surgery  We will follow patient while hospitalized. I have discussed with his caregiver [name] Branch this morning He appears to be doing well overall  Please contact me directly if I can be of assistance in the meanwhile.   Chief Complaint: fall, hypotension  HPI:  49 y/o ? group home resident h/o Mental retardation/Shizophrenia, hypersomnia c OSA-Isch CM EF 55-60%, Splenomegally secondary to sarcoidosis, prior Fournier's gangrene, ?Sarcoid, colonic inertia + Severe Obstipation [very redundant colon on last Colonoscopy 12/2005--multiple admissions for Fecal impaction/COlonic overflow--it was decided 12/30 that 2/2 to Ct evidence of Hydronephrosi and risk of possible acute kidney injury from overdistended colon that he might need a total colectomy He had a open abdominal colectomy with rigid proctoscope perrformed on 09/25/14 by Dr. gross and was discharged subsequently on 09/30/14 during hospital stay he was appropriately advanced to solid diet and he had mild loose BMs but they were controlled and multiple laxatives were discontinued. He went home and subsequent to this on the fourth had an episode of nausea and subsequent to that episode has not been eating much since then, maybe about 50% of what he normally takes in..  He has been passing stools regularly 3-4 stools a day During the  night on 3/5 he had an episode of diarrhea that was nonbloody and subsequently got up this morning and fell about 3 times. He did not hurt anywhere on his body. His caregiver  tells me that when he had his surgery for Fournier's gangrene in about 2004 he had similar episode with falls and weakness and was fluid repleted the day after surgery and had come back to the hospital for this  In the emergency room he was given 2 L of IV fluids to IVs were placed in either arm and is blood sugar came up from the 95M systolic to close to the 841 systolic.   I can't obtain review of systems directly from patient given his mental retardation  Past Medical History  Diagnosis Date  . Colonic inertia   . Hyperlipidemia   . Constipation   . Sarcoidosis   . 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 Schizophrenia   . Mental retardation   . Cardiomyopathy   . Glaucoma   . Obstruction of colon     recurrent  . ABSCESS, ANAL/RECTAL REGIONS 08/18/2006    Annotation: Fournier gangrene Qualifier: History of  By: Garnette Scheuermann MD, Arlee Muslim    . 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    Annotation: chronic with stercoral ulcer  Qualifier: Diagnosis of  By: Garnette Scheuermann MD, Arlee Muslim     Past Surgical History  Procedure Laterality Date  . Groin surgery      boil drained  . Colectomy N/A 09/25/2014    Procedure: OPEN ABDOMINAL COLECTOMY;  Surgeon: Michael Boston, MD;  Location: [facility];  Service: General;  Laterality: N/A;  . Proctoscopy N/A 09/25/2014    Procedure: RIGID PROCTOSCOPY;  Surgeon: Michael Boston, MD;  Location: [facility];  Service: General;  Laterality: N/A;   Social History:  reports that he has never smoked. He has never used smokeless tobacco. He reports that he does not drink alcohol or use illicit drugs.  No Known Allergies Family History  Problem Relation Age of Onset  . Diabetes Mother   . Colon cancer Neg Hx     Prior to Admission medications   Medication Sig Start Date End Date Taking? Authorizing Provider  cloZAPine (CLOZARIL) 100 MG tablet Take 100 mg by mouth 2 (two) times daily.    Yes Historical Provider, MD  cloZAPine (CLOZARIL) 25 MG tablet Take 25 mg by mouth 2 (two) times daily.   Yes  Historical Provider, MD  fluvoxaMINE (LUVOX) 100 MG tablet Take 300 mg by mouth at bedtime.   Yes Historical Provider, MD  Multiple Vitamin (MULTIVITAMIN) capsule Take 1 capsule by mouth daily. 09/16/14  Yes [name], MD  naproxen (NAPROSYN) 500 MG tablet Take 1 tablet (500 mg total) by mouth every 12 (twelve) hours as needed for mild pain or moderate pain. 09/30/14  Yes Michael Boston, MD  Nutritional Supplements (ENSURE COMPLETE SHAKE) LIQD Take 1 Can by mouth 2 (two) times daily. 07/29/14  Yes Lafayette Dragon, MD  oxyCODONE (OXY IR/ROXICODONE) 5 MG immediate release tablet Take 1-2 tablets (5-10 mg total) by mouth every 4 (four) hours as needed for moderate pain, severe pain or breakthrough pain. 09/25/14  Yes Michael Boston, MD  polyethylene glycol (MIRALAX / GLYCOLAX) packet Take 8.5 g by mouth 2 (two) times daily. 09/29/14  Yes Michael Boston, MD  pravastatin (PRAVACHOL) 40 MG tablet TAKE ONE TABLET EACH DAY 08/28/14  Yes [name], MD  travoprost, benzalkonium, (TRAVATAN) 0.004 % ophthalmic solution Place 1 drop into both  eyes at bedtime.    Yes Historical Provider, MD   Physical Exam: Blood pressure 118/72, pulse 90, temperature 98.6 F (37 C), temperature source Oral, resp. rate 18, height 6\' 2"  (1.88 m), weight 81 kg (178 lb 9.2 oz), SpO2 100 %. Filed Vitals:   10/06/14 1400  BP: 118/72  Pulse: 90  Temp: 98.6 F (37 C)  Resp: 18   Monosyllable disheveled EOMI no, no pallor Abdomen soft benign no rebound no guarding midline scar very well-healed by primary intention, distended and upper abdominal quadrant no rebound no guarding midline scar moderate dentition,? JVD left side Neuro is Grossly intact moving all 4 limbs equally without issue.  Labs on Admission:  Basic Metabolic Panel:  Recent Labs Lab 10/04/14 1008 10/05/14 0930 10/06/14 0340  NA 137 140 137  K 3.6 4.2 3.9  CL 102 109 109  CO2 28 25 25   GLUCOSE 115* 94 102*  BUN 30* 27* 13  CREATININE 1.19 0.90 0.65   CALCIUM 7.9* 7.9* 7.7*   Liver Function Tests:  Recent Labs Lab 10/06/14 0340  AST 22  ALT 25  ALKPHOS 46  BILITOT 0.4  PROT 4.8*  ALBUMIN 2.4*    Recent Labs Lab 10/06/14 0340  LIPASE 46   No results for input(s): AMMONIA in the last 168 hours. CBC:  Recent Labs Lab 10/04/14 1008 10/04/14 1800 10/05/14 0520 10/06/14 0340  WBC 9.3 8.7 9.5  --   NEUTROABS 7.2  --  7.3  --   HGB 7.6* 5.8* 7.8* 8.2*  HCT 24.0* 18.2* 23.5*  --   MCV 93.8 94.8 89.7  --   PLT 382 301 239  --    Cardiac Enzymes: No results for input(s): CKTOTAL, CKMB, CKMBINDEX, TROPONINI in the last 168 hours. BNP: Invalid input(s): POCBNP CBG: No results for input(s): GLUCAP in the last 168 hours.  Radiological Exams on Admission: Ct Abdomen Pelvis W Contrast  10/05/2014   CLINICAL DATA:  Abdominal distention. Colectomy 9 days ago. Pneumoperitoneum on plain films.  EXAM: CT ABDOMEN AND PELVIS WITH CONTRAST  TECHNIQUE: Multidetector CT imaging of the abdomen and pelvis was performed using the standard protocol following bolus administration of intravenous contrast.  CONTRAST:  144mL OMNIPAQUE IOHEXOL 300 MG/ML  SOLN  COMPARISON:  Plain films 10/05/2014.  CT 08/03/2014.  FINDINGS: Minimal bibasilar atelectasis.  No effusions.  Heart is normal size.  There is pneumoperitoneum. Moderate ascites in the abdomen and pelvis. Patient is status post colectomy. Ileo rectal anastomosis is noted in the lower pelvis. No localized extraluminal gas noted in the region of the anastomosis.  Small bowel is dilated diffusely to the anastomosis, presumably ileus.  Gallbladder is contracted. Liver, spleen, pancreas, adrenals are unremarkable. There is mild to moderate bilateral hydronephrosis, stable since prior study. Urinary bladder wall appears mildly thickened. Aorta is normal caliber.  No acute bony abnormality or focal bone lesion.  IMPRESSION: Mild-to-moderate pneumoperitoneum and moderate ascites in the abdomen and pelvis.  While this could be related to recent postoperative state, it is difficult to completely exclude dehiscence at the anastomosis although no localize sign of extraluminal gas noted at the anastomosis.  Diffuse small bowel dilatation, likely ileus.   Electronically Signed   By: Rolm Baptise M.D.   On: 10/05/2014 16:53   Dg Abd Acute W/chest  10/05/2014   CLINICAL DATA:  Open abdominal colectomy 09/25/2014. Abdominal pain and distention.  EXAM: ACUTE ABDOMEN SERIES (ABDOMEN 2 VIEW & CHEST 1 VIEW)  COMPARISON:  CT of the  abdomen and pelvis 08/03/2014  FINDINGS: Heart size is normal. There is infiltrate within the right upper lobe. No pulmonary edema.  There is free intraperitoneal air beneath the diaphragm. Marked dilatation of small bowel loops is consistent with small bowel obstruction. There is paucity of bowel gas in the pelvis, raising the question of abscess or collapsed loops in the pelvis.  IMPRESSION: 1. Right upper lobe infiltrate.  Suspect infectious process. 2. Moderate free intraperitoneal air. 3. High-grade small bowel obstruction. Consider further evaluation is CT of the abdomen and pelvis with contrast. 4. The salient findings were discussed with STEVEN GROSS on 10/05/2014 at 11:42 am.   Electronically Signed   By: Nolon Nations M.D.   On: 10/05/2014 11:56    EKG: Independently reviewed. sinus tachycardia on admission, right bundle branch block, rate related changes, PR interval 0.12 QRS axis 10  Time spent: 35 minutes [facility] Pager [phone]  If 7PM-7AM, please contact night-coverage [email or web address] Password [facility] 10/06/2014, 4:41 PM

## 2014-10-06 NOTE — Telephone Encounter (Signed)
Left a message for [name] to call back with any concerns.

## 2014-10-07 DIAGNOSIS — G4733 Obstructive sleep apnea (adult) (pediatric): Secondary | ICD-10-CM

## 2014-10-07 DIAGNOSIS — K592 Neurogenic bowel, not elsewhere classified: Secondary | ICD-10-CM

## 2014-10-07 DIAGNOSIS — D869 Sarcoidosis, unspecified: Secondary | ICD-10-CM

## 2014-10-07 DIAGNOSIS — F209 Schizophrenia, unspecified: Secondary | ICD-10-CM

## 2014-10-07 DIAGNOSIS — D649 Anemia, unspecified: Secondary | ICD-10-CM

## 2014-10-07 DIAGNOSIS — D638 Anemia in other chronic diseases classified elsewhere: Secondary | ICD-10-CM

## 2014-10-07 DIAGNOSIS — K599 Functional intestinal disorder, unspecified: Secondary | ICD-10-CM

## 2014-10-07 LAB — CBC WITH DIFFERENTIAL/PLATELET
Basophils Absolute: 0 10*3/uL (ref 0.0–0.1)
Basophils Relative: 0 % (ref 0–1)
Eosinophils Absolute: 0.1 10*3/uL (ref 0.0–0.7)
Eosinophils Relative: 1 % (ref 0–5)
HCT: 23.1 % — ABNORMAL LOW (ref 39.0–52.0)
HEMOGLOBIN: 7.5 g/dL — AB (ref 13.0–17.0)
LYMPHS PCT: 12 % (ref 12–46)
Lymphs Abs: 1.4 10*3/uL (ref 0.7–4.0)
MCH: 30.1 pg (ref 26.0–34.0)
MCHC: 32.5 g/dL (ref 30.0–36.0)
MCV: 92.8 fL (ref 78.0–100.0)
Monocytes Absolute: 0.4 10*3/uL (ref 0.1–1.0)
Monocytes Relative: 3 % (ref 3–12)
NEUTROS ABS: 9.6 10*3/uL — AB (ref 1.7–7.7)
Neutrophils Relative %: 84 % — ABNORMAL HIGH (ref 43–77)
PLATELETS: 303 10*3/uL (ref 150–400)
RBC: 2.49 MIL/uL — AB (ref 4.22–5.81)
RDW: 19.2 % — AB (ref 11.5–15.5)
WBC: 11.5 10*3/uL — ABNORMAL HIGH (ref 4.0–10.5)

## 2014-10-07 LAB — BASIC METABOLIC PANEL
Anion gap: 4 — ABNORMAL LOW (ref 5–15)
BUN: 9 mg/dL (ref 6–23)
CO2: 27 mmol/L (ref 19–32)
CREATININE: 0.7 mg/dL (ref 0.50–1.35)
Calcium: 8 mg/dL — ABNORMAL LOW (ref 8.4–10.5)
Chloride: 109 mmol/L (ref 96–112)
GFR calc non Af Amer: >90 mL/min (ref >90)
Glucose, Bld: 85 mg/dL (ref 70–99)
Potassium: 4 mmol/L (ref 3.5–5.1)
SODIUM: 140 mmol/L (ref 135–145)

## 2014-10-07 LAB — CLOSTRIDIUM DIFFICILE BY PCR: CDIFFPCR: NEGATIVE

## 2014-10-07 MED ORDER — METOPROLOL SUCCINATE 12.5 MG HALF TABLET
12.5000 mg | ORAL_TABLET | Freq: Every day | ORAL | Status: DC
  Administered 2014-10-07: 12.5 mg via ORAL
  Filled 2014-10-07 (×2): qty 1

## 2014-10-07 MED ORDER — FERROUS SULFATE 325 (65 FE) MG PO TABS
325.0000 mg | ORAL_TABLET | Freq: Three times a day (TID) | ORAL | Status: DC
  Administered 2014-10-07: 325 mg via ORAL
  Filled 2014-10-07 (×3): qty 1

## 2014-10-07 MED ORDER — METOPROLOL SUCCINATE ER 25 MG PO TB24: 12.5000 mg | ORAL_TABLET | Freq: Every day | ORAL | Status: DC

## 2014-10-07 NOTE — Discharge Instructions (Signed)
CLOZAPINE antipsychotic medication may be cause irregular bowels.  Discuss with your psychiatrist to see if different medication can be used.  GETTING TO GOOD BOWEL HEALTH. Irregular bowel habits such as constipation and diarrhea can lead to many problems over time.  Having one soft bowel movement a day is the most important way to prevent further problems.  The anorectal canal is designed to handle stretching and feces to safely manage our ability to get rid of solid waste (feces, poop, stool) out of our body.  BUT, hard constipated stools can act like ripping concrete bricks and diarrhea can be a burning fire to this very sensitive area of our body, causing inflamed hemorrhoids, anal fissures, increasing risk is perirectal abscesses, abdominal pain/bloating, an making irritable bowel worse.     The goal: ONE SOFT BOWEL MOVEMENT A DAY!  To have soft, regular bowel movements:   Drink at least 8 tall glasses of water a day.    Take plenty of fiber.  Fiber is the undigested part of plant food that passes into the colon, acting s natures broom to encourage bowel motility and movement.  Fiber can absorb and hold large amounts of water. This results in a larger, bulkier stool, which is soft and easier to pass. Work gradually over several weeks up to 6 servings a day of fiber (25g a day even more if needed) in the form of: o Vegetables -- Root (potatoes, carrots, turnips), leafy green (lettuce, salad greens, celery, spinach), or cooked high residue (cabbage, broccoli, etc) o Fruit -- Fresh (unpeeled skin & pulp), Dried (prunes, apricots, cherries, etc ),  or stewed ( applesauce)  o Whole grain breads, pasta, etc (whole wheat)  o Bran cereals   Bulking Agents -- This type of water-retaining fiber generally is easily obtained each day by one of the following:  o Psyllium bran -- The psyllium plant is remarkable because its ground seeds can retain so much water. This product is available as Metamucil,  Konsyl, Effersyllium, Per Diem Fiber, or the less expensive generic preparation in drug and health food stores. Although labeled a laxative, it really is not a laxative.  o Methylcellulose -- This is another fiber derived from wood which also retains water. It is available as Citrucel. o Polyethylene Glycol - and artificial fiber commonly called Miralax or Glycolax.  It is helpful for people with gassy or bloated feelings with regular fiber o Flax Seed - a less gassy fiber than psyllium  No reading or other relaxing activity while on the toilet. If bowel movements take longer than 5 minutes, you are too constipated  AVOID CONSTIPATION.  High fiber and water intake usually takes care of this.  Sometimes a laxative is needed to stimulate more frequent bowel movements, but   Laxatives are not a good long-term solution as it can wear the colon out. o Osmotics (Milk of Magnesia, Fleets phosphosoda, Magnesium citrate, MiraLax, GoLytely) are safer than  o Stimulants (Senokot, Castor Oil, Dulcolax, Ex Lax)    o Do not take laxatives for more than 7days in a row.   IF SEVERELY CONSTIPATED, try a Bowel Retraining Program: o Do not use laxatives.  o Eat a diet high in roughage, such as bran cereals and leafy vegetables.  o Drink six (6) ounces of prune or apricot juice each morning.  o Eat two (2) large servings of stewed fruit each day.  o Take one (1) heaping tablespoon of a psyllium-based bulking agent twice a day. Use sugar-free  sweetener when possible to avoid excessive calories.  o Eat a normal breakfast.  o Set aside 15 minutes after breakfast to sit on the toilet, but do not strain to have a bowel movement.  o If you do not have a bowel movement by the third day, use an enema and repeat the above steps.   Controlling diarrhea o Switch to liquids and simpler foods for a few days to avoid stressing your intestines further. o Avoid dairy products (especially milk & ice cream) for a short time.   The intestines often can lose the ability to digest lactose when stressed. o Avoid foods that cause gassiness or bloating.  Typical foods include beans and other legumes, cabbage, broccoli, and dairy foods.  Every person has some sensitivity to other foods, so listen to our body and avoid those foods that trigger problems for you. o Adding fiber (Citrucel, Metamucil, psyllium, Miralax) gradually can help thicken stools by absorbing excess fluid and retrain the intestines to act more normally.  Slowly increase the dose over a few weeks.  Too much fiber too soon can backfire and cause cramping & bloating. o Probiotics (such as active yogurt, Align, etc) may help repopulate the intestines and colon with normal bacteria and calm down a sensitive digestive tract.  Most studies show it to be of mild help, though, and such products can be costly. o Medicines: - Bismuth subsalicylate (ex. Kayopectate, Pepto Bismol) every 30 minutes for up to 6 doses can help control diarrhea.  Avoid if pregnant. - Loperamide (Immodium) can slow down diarrhea.  Start with two tablets (60m total) first and then try one tablet every 6 hours.  Avoid if you are having fevers or severe pain.  If you are not better or start feeling worse, stop all medicines and call your doctor for advice o Call your doctor if you are getting worse or not better.  Sometimes further testing (cultures, endoscopy, X-ray studies, bloodwork, etc) may be needed to help diagnose and treat the cause of the diarrhea.  ABDOMINAL SURGERY: POST OP INSTRUCTIONS  1. DIET: Follow a light bland diet the first 24 hours after arrival home, such as soup, liquids, crackers, etc.  Be sure to include lots of fluids daily.  Avoid fast food or heavy meals as your are more likely to get nauseated.  Eat a low fat the next few days after surgery.   2. Take your usually prescribed home medications unless otherwise directed. 3. PAIN CONTROL: a. Pain is best controlled by a usual  combination of three different methods TOGETHER: i. Ice/Heat ii. Over the counter pain medication iii. Prescription pain medication b. Most patients will experience some swelling and bruising around the incisions.  Ice packs or heating pads (30-60 minutes up to 6 times a day) will help. Use ice for the first few days to help decrease swelling and bruising, then switch to heat to help relax tight/sore spots and speed recovery.  Some people prefer to use ice alone, heat alone, alternating between ice & heat.  Experiment to what works for you.  Swelling and bruising can take several weeks to resolve.   c. It is helpful to take an over-the-counter pain medication regularly for the first few weeks.  Choose one of the following that works best for you: i. Naproxen (Aleve, etc)  Two 2242mtabs twice a day ii. Ibuprofen (Advil, etc) Three 20068mabs four times a day (every meal & bedtime) iii. Acetaminophen (Tylenol, etc) 500-650m50mur times  a day (every meal & bedtime) d. A  prescription for pain medication (such as oxycodone, hydrocodone, etc) should be given to you upon discharge.  Take your pain medication as prescribed.  i. If you are having problems/concerns with the prescription medicine (does not control pain, nausea, vomiting, rash, itching, etc), please call us [phone] to see if we need to switch you to a different pain medicine that will work better for you and/or control your side effect better. ii. If you need a refill on your pain medication, please contact your pharmacy.  They will contact our office to request authorization. Prescriptions will not be filled after 5 pm or on week-ends. 4. Avoid getting constipated.  Between the surgery and the pain medications, it is common to experience some constipation.  Increasing fluid intake and taking a fiber supplement (such as Metamucil, Citrucel, FiberCon, MiraLax, etc) 1-2 times a day regularly will usually help prevent this problem from  occurring.  A mild laxative (prune juice, Milk of Magnesia, MiraLax, etc) should be taken according to package directions if there are no bowel movements after 48 hours.   5. Watch out for diarrhea.  If you have many loose bowel movements, simplify your diet to bland foods & liquids for a few days.  Stop any stool softeners and decrease your fiber supplement.  Switching to mild anti-diarrheal medications (Kayopectate, Pepto Bismol) can help.  If this worsens or does not improve, please call us. 6. Wash / shower every day.  You may shower over the incision / wound.  Avoid baths until the skin is fully healed.  Continue to shower over incision(s) after the dressing is off. 7. Remove your waterproof bandages 5 days after surgery.  You may leave the incision open to air.  Remove any wicks or ribbons in your wound.  If you have an open wound, please see wound care instructions. You may replace a dressing/Band-Aid to cover the incision for comfort if you wish. 8. ACTIVITIES as tolerated:   a. You may resume regular (light) daily activities beginning the next day--such as daily self-care, walking, climbing stairs--gradually increasing activities as tolerated.  If you can walk 30 minutes without difficulty, it is safe to try more intense activity such as jogging, treadmill, bicycling, low-impact aerobics, swimming, etc. b. Save the most intensive and strenuous activity for last such as sit-ups, heavy lifting, contact sports, etc  Refrain from any heavy lifting or straining until you are off narcotics for pain control.   c. DO NOT PUSH THROUGH PAIN.  Let pain be your guide: If it hurts to do something, don't do it.  Pain is your body warning you to avoid that activity for another week until the pain goes down. d. You may drive when you are no longer taking prescription pain medication, you can comfortably wear a seatbelt, and you can safely maneuver your car and apply brakes. e. Dennis Bast may have sexual intercourse when  it is comfortable.  9. FOLLOW UP in our office a. Please call [facility] at [phone] to set up an appointment to see your surgeon in the office for a follow-up appointment approximately 1-2 weeks after your surgery. b. Make sure that you call for this appointment the day you arrive home to insure a convenient appointment time. 10. IF YOU HAVE DISABILITY OR FAMILY LEAVE FORMS, BRING THEM TO THE OFFICE FOR PROCESSING.  DO NOT GIVE THEM TO YOUR DOCTOR.   WHEN TO CALL us [phone]: 1. Poor pain  control 2. Reactions / problems with new medications (rash/itching, nausea, etc)  3. Fever over 101.5 F (38.5 C) 4. Inability to urinate 5. Nausea and/or vomiting 6. Worsening swelling or bruising 7. Continued bleeding from incision. 8. Increased pain, redness, or drainage from the incision  The clinic staff is available to answer your questions during regular business hours (8:30am-5pm).  Please dont hesitate to call and ask to speak to one of our nurses for clinical concerns.   A surgeon from [facility] is always on call at the hospitals   If you have a medical emergency, go to the nearest emergency room or call 911.    [facility], [place], [place], [place], [place]  [address] ? MAIN: [phone] ? TOLL FREE: [phone] ? FAX [phone] [email or web address]   Iron Deficiency Anemia Anemia is a condition in which there are less red blood cells or hemoglobin in the blood than normal. Hemoglobin is the part of red blood cells that carries oxygen. Iron deficiency anemia is anemia caused by too little iron. It is the most common type of anemia. It may leave you tired and short of breath. CAUSES   Lack of iron in the diet.  Poor absorption of iron, as seen with intestinal disorders.  Intestinal bleeding.  Heavy periods. SIGNS AND SYMPTOMS  Mild anemia may not be noticeable. Symptoms may  include:  Fatigue.  Headache.  Pale skin.  Weakness.  Tiredness.  Shortness of breath.  Dizziness.  Cold hands and feet.  Fast or irregular heartbeat. DIAGNOSIS  Diagnosis requires a thorough evaluation and physical exam by your health care provider. Blood tests are generally used to confirm iron deficiency anemia. Additional tests may be done to find the underlying cause of your anemia. These may include:  Testing for blood in the stool (fecal occult blood test).  A procedure to see inside the colon and rectum (colonoscopy).  A procedure to see inside the esophagus and stomach (endoscopy). TREATMENT  Iron deficiency anemia is treated by correcting the cause of the deficiency. Treatment may involve:  Adding iron-rich foods to your diet.  Taking iron supplements. Pregnant or breastfeeding women need to take extra iron because their normal diet usually does not provide the required amount.  Taking vitamins. Vitamin C improves the absorption of iron. Your health care provider may recommend that you take your iron tablets with a glass of orange juice or vitamin C supplement.  Medicines to make heavy menstrual flow lighter.  Surgery. HOME CARE INSTRUCTIONS   Take iron as directed by your health care provider.  If you cannot tolerate taking iron supplements by mouth, talk to your health care provider about taking them through a vein (intravenously) or an injection into a muscle.  For the best iron absorption, iron supplements should be taken on an empty stomach. If you cannot tolerate them on an empty stomach, you may need to take them with food.  Do not drink milk or take antacids at the same time as your iron supplements. Milk and antacids may interfere with the absorption of iron.  Iron supplements can cause constipation. Make sure to include fiber in your diet to prevent constipation. A stool softener may also be recommended.  Take vitamins as directed by your health  care provider.  Eat a diet rich in iron. Foods high in iron include liver, lean beef, whole-grain bread, eggs, dried fruit, and dark green leafy vegetables. SEEK IMMEDIATE MEDICAL CARE IF:  You faint. If this happens, do not drive. Call your local emergency services (911 in [place] if no other help is available.  You have chest pain.  You feel nauseous or vomit.  You have severe or increased shortness of breath with activity.  You feel weak.  You have a rapid heartbeat.  You have unexplained sweating.  You become light-headed when getting up from a chair or bed. MAKE SURE YOU:   Understand these instructions.  Will watch your condition.  Will get help right away if you are not doing well or get worse. Document Released: 07/14/2000 Document Revised: 07/22/2013 Document Reviewed: 03/24/2013 [facility] Patient Information 2015 [place], [place]. This information is not intended to replace advice given to you by your health care provider. Make sure you discuss any questions you have with your health care provider.   Exercise to Stay Healthy Exercise helps you become and stay healthy. EXERCISE IDEAS AND TIPS Choose exercises that:  You enjoy.  Fit into your day. You do not need to exercise really hard to be healthy. You can do exercises at a slow or medium level and stay healthy. You can:  Stretch before and after working out.  Try yoga, Pilates, or tai chi.  Lift weights.  Walk fast, swim, jog, run, climb stairs, bicycle, dance, or rollerskate.  Take aerobic classes. Exercises that burn about 150 calories:  Running 1  miles in 15 minutes.  Playing volleyball for 45 to 60 minutes.  Washing and waxing a car for 45 to 60 minutes.  Playing touch football for 45 minutes.  Walking 1  miles in 35 minutes.  Pushing a stroller 1  miles in 30 minutes.  Playing basketball for 30 minutes.  Raking leaves for 30 minutes.  Bicycling 5 miles in 30 minutes.  Walking 2  miles in 30 minutes.  Dancing for 30 minutes.  Shoveling snow for 15 minutes.  Swimming laps for 20 minutes.  Walking up stairs for 15 minutes.  Bicycling 4 miles in 15 minutes.  Gardening for 30 to 45 minutes.  Jumping rope for 15 minutes.  Washing windows or floors for 45 to 60 minutes. Document Released: 08/19/2010 Document Revised: 10/09/2011 Document Reviewed: 08/19/2010 [facility] Patient Information 2015 [place], [place]. This information is not intended to replace advice given to you by your health care provider. Make sure you discuss any questions you have with your health care provider.

## 2014-10-07 NOTE — Progress Notes (Signed)
[facility]  [address] [place], [place] [address] Phone: [phone] FAX: [phone]    Mackinley Kiehn [id number] 1966-03-07  CARE TEAM:  PCP: Gilles Chiquito, MD  Outpatient Care Team: Patient Care Team: [name], MD as PCP - General (Internal Medicine) Lafayette Dragon, MD as Consulting Physician (Gastroenterology)  Inpatient Treatment Team: Treatment Team: Attending Provider: Michael Boston, MD; Consulting Physician: Michael Boston, MD; Consulting Physician: Ian Bushman, MD; Technician: Leda Quail, NT; Registered Nurse: Nolene Ebbs, RN   Subjective:  "Can I go home?" Denies pain Tolerating PO OK Walking well Caregiver in room - thinks he is OK to go home  Objective:  Vital signs:  Filed Vitals:   10/06/14 1400 10/06/14 2230 10/07/14 0100 10/07/14 0500  BP: 1'18/72 87/52 95/50 ' 117/70  Pulse: 90 100  100  Temp: 98.6 F (37 C) 98.4 F (36.9 C)  98 F (36.7 C)  TempSrc: Oral Oral  Oral  Resp: '18 16  17  ' Height:      Weight:      SpO2: 100% 100%  100%    Last BM Date: 10/06/14  Intake/Output   Yesterday:  03/08 0701 - 03/09 0700 In: 1630 [P.O.:480; I.V.:150; IV Piggyback:1000] Out: 0  This shift:     Bowel function:  Flatus: YES  BM: YES.  Less loose 3-4/day  Drain: n/a  Physical Exam:  General: Pt awakens alert, oriented x1 in no acute distress.   Eyes: PERRL, normal EOM.  Sclera clear.  No icterus Neuro: CN II-XII intact w/o focal sensory/motor deficits. Lymph: No head/neck/groin lymphadenopathy Psych:  No delerium/psychosis/paranoia.  Calm, smiling HENT: Normocephalic, Mucus membranes moist.  No thrush Neck: Supple, No tracheal deviation Chest: No chest wall pain w good excursion CV:  Pulses intact.  Regular rhythm MS: Normal AROM mjr joints.  No obvious deformity Abdomen: Soft.  Mildly distended.  Nontender.  Incision c/d/i.  No evidence of peritonitis.  No pain with cough,  bedshake, palpation, percussion.  No incarcerated hernias. GU:  Nio scrotal swelling.  No inguinal hernias Ext:  SCDs BLE.  No mjr edema.  No cyanosis Skin: No petechiae / purpura  Study Conclusions  - Left ventricle: The cavity size was mildly dilated. Systolic function was mildly to moderately reduced. The estimated ejection fraction was in the range of 40% to 45%. Wall motion was normal; there were no regional wall motion abnormalities. Doppler parameters are consistent with abnormal left ventricular relaxation (grade 1 diastolic dysfunction). There was no evidence of elevated ventricular filling pressure by Doppler parameters. - Aortic valve: Trileaflet; normal thickness leaflets. There was moderate regurgitation. - Mitral valve: Structurally normal valve. There was trivial regurgitation. - Left atrium: The atrium was normal in size. - Right ventricle: The cavity size was severely dilated. Wall thickness was normal. Systolic function was normal. - Right atrium: The atrium was moderately dilated. - Tricuspid valve: There was mild regurgitation. - Pulmonic valve: There was no regurgitation. - Pulmonary arteries: Systolic pressure was within the normal range. - Pericardium, extracardiac: There was no pericardial effusion.  Transthoracic echocardiography. M-mode, complete 2D, spectral Doppler, and color Doppler. Birthdate: Patient birthdate: [birth year]. Age: Patient is 49 yr old. Sex: Gender: male. BMI: 23.2 kg/m^2. Blood pressure:   90/50 Patient status: Inpatient. Study date: Study date: 10/05/2014. Study time: 02:38 PM. Location: ICU/CCU  Problem List:   Principal Problem:   Shock Active Problems:   Sarcoidosis   Schizophrenia, unspecified type   Obstructive sleep apnea  Neurogenic bowel   Colonic inertia s/p abdominal colectomy 09/25/2014   Anemia of chronic disease   Postoperative anemia   Assessment  [name]  49 y.o.   male     Procedure(s): OPEN ABDOMINAL COLECTOMY RIGID PROCTOSCOPY  Recovering.  Severe anemia of uncertain etiology  Plan:  -adv diet -follow off IVF -bowel regimen - Pepto PRN   Get accurate I&Os -inc HR borderline - still with symptomatic anemia - iron BID increase to TID.  Consider transfusion again if not better.  D/w IM & pt -Echo with RV cardiomegaly but good LVEF.  Low dose metoprolol   -No pain/peritonitis = doubt leak.  Follow exam. -MS stable - continue clozapine for schizophrenia & SSRI -VTE prophylaxis- SCDs, etc -mobilize as tolerated to help recovery  D/C patient from hospital when patient meets criteria (anticipate in later today):  Tolerating oral intake well Ambulating in walkways Adequate pain control without IV medications Urinating  Having flatus   I updated the status of the patient to the patient, his caregiver, & his RN.  I made recommendations.  I answered questions.  Understanding & appreciation was expressed.    [name], M.D., F.A.C.S. Gastrointestinal and Minimally Invasive Surgery [facility] [address], [place],  [address] [phone] Main / Paging   10/07/2014   Results:   Labs: Results for orders placed or performed during the hospital encounter of 10/04/14 (from the past 48 hour(s))  Reticulocytes     Status: Abnormal   Collection Time: 10/05/14  9:30 AM  Result Value Ref Range   Retic Ct Pct 5.1 (H) 0.4 - 3.1 %   RBC. 3.18 (L) 4.22 - 5.81 MIL/uL   Retic Count, Manual 162.2 19.0 - 186.0 K/uL  Basic metabolic panel     Status: Abnormal   Collection Time: 10/05/14  9:30 AM  Result Value Ref Range   Sodium 140 135 - 145 mmol/L   Potassium 4.2 3.5 - 5.1 mmol/L   Chloride 109 96 - 112 mmol/L   CO2 25 19 - 32 mmol/L   Glucose, Bld 94 70 - 99 mg/dL   BUN 27 (H) 6 - 23 mg/dL   Creatinine, Ser 0.90 0.50 - 1.35 mg/dL   Calcium 7.9 (L) 8.4 - 10.5 mg/dL   GFR calc non Af Amer >90 >90  mL/min   GFR calc Af Amer >90 >90 mL/min    Comment: (NOTE) The eGFR has been calculated using the CKD EPI equation. This calculation has not been validated in all clinical situations. eGFR's persistently <90 mL/min signify possible Chronic Kidney Disease.    Anion gap 6 5 - 15  Basic metabolic panel     Status: Abnormal   Collection Time: 10/06/14  3:40 AM  Result Value Ref Range   Sodium 137 135 - 145 mmol/L   Potassium 3.9 3.5 - 5.1 mmol/L   Chloride 109 96 - 112 mmol/L   CO2 25 19 - 32 mmol/L   Glucose, Bld 102 (H) 70 - 99 mg/dL   BUN 13 6 - 23 mg/dL    Comment: DELTA CHECK NOTED REPEATED TO VERIFY    Creatinine, Ser 0.65 0.50 - 1.35 mg/dL   Calcium 7.7 (L) 8.4 - 10.5 mg/dL   GFR calc non Af Amer >90 >90 mL/min   GFR calc Af Amer >90 >90 mL/min    Comment: (NOTE) The eGFR has been calculated using the CKD EPI equation. This calculation has not been validated in  all clinical situations. eGFR's persistently <90 mL/min signify possible Chronic Kidney Disease.    Anion gap 3 (L) 5 - 15  Hemoglobin     Status: Abnormal   Collection Time: 10/06/14  3:40 AM  Result Value Ref Range   Hemoglobin 8.2 (L) 13.0 - 17.0 g/dL  Hepatic function panel     Status: Abnormal   Collection Time: 10/06/14  3:40 AM  Result Value Ref Range   Total Protein 4.8 (L) 6.0 - 8.3 g/dL   Albumin 2.4 (L) 3.5 - 5.2 g/dL   AST 22 0 - 37 U/L   ALT 25 0 - 53 U/L   Alkaline Phosphatase 46 39 - 117 U/L   Total Bilirubin 0.4 0.3 - 1.2 mg/dL   Bilirubin, Direct 0.1 0.0 - 0.5 mg/dL   Indirect Bilirubin 0.3 0.3 - 0.9 mg/dL  Lipase, blood     Status: None   Collection Time: 10/06/14  3:40 AM  Result Value Ref Range   Lipase 46 11 - 59 U/L  Clostridium Difficile by PCR     Status: None   Collection Time: 10/06/14  8:23 PM  Result Value Ref Range   C difficile by pcr NEGATIVE NEGATIVE  Basic metabolic panel     Status: Abnormal   Collection Time: 10/07/14  5:38 AM  Result Value Ref Range   Sodium 140  135 - 145 mmol/L   Potassium 4.0 3.5 - 5.1 mmol/L   Chloride 109 96 - 112 mmol/L   CO2 27 19 - 32 mmol/L   Glucose, Bld 85 70 - 99 mg/dL   BUN 9 6 - 23 mg/dL   Creatinine, Ser 0.70 0.50 - 1.35 mg/dL   Calcium 8.0 (L) 8.4 - 10.5 mg/dL   GFR calc non Af Amer >90 >90 mL/min   GFR calc Af Amer >90 >90 mL/min    Comment: (NOTE) The eGFR has been calculated using the CKD EPI equation. This calculation has not been validated in all clinical situations. eGFR's persistently <90 mL/min signify possible Chronic Kidney Disease.    Anion gap 4 (L) 5 - 15  CBC with Differential/Platelet     Status: Abnormal   Collection Time: 10/07/14  5:38 AM  Result Value Ref Range   WBC 11.5 (H) 4.0 - 10.5 K/uL   RBC 2.49 (L) 4.22 - 5.81 MIL/uL   Hemoglobin 7.5 (L) 13.0 - 17.0 g/dL   HCT 23.1 (L) 39.0 - 52.0 %   MCV 92.8 78.0 - 100.0 fL   MCH 30.1 26.0 - 34.0 pg   MCHC 32.5 30.0 - 36.0 g/dL   RDW 19.2 (H) 11.5 - 15.5 %   Platelets 303 150 - 400 K/uL   Neutrophils Relative % 84 (H) 43 - 77 %   Neutro Abs 9.6 (H) 1.7 - 7.7 K/uL   Lymphocytes Relative 12 12 - 46 %   Lymphs Abs 1.4 0.7 - 4.0 K/uL   Monocytes Relative 3 3 - 12 %   Monocytes Absolute 0.4 0.1 - 1.0 K/uL   Eosinophils Relative 1 0 - 5 %   Eosinophils Absolute 0.1 0.0 - 0.7 K/uL   Basophils Relative 0 0 - 1 %   Basophils Absolute 0.0 0.0 - 0.1 K/uL    Imaging / Studies: Ct Abdomen Pelvis W Contrast  10/05/2014   CLINICAL DATA:  Abdominal distention. Colectomy 9 days ago. Pneumoperitoneum on plain films.  EXAM: CT ABDOMEN AND PELVIS WITH CONTRAST  TECHNIQUE: Multidetector CT imaging of the abdomen and  pelvis was performed using the standard protocol following bolus administration of intravenous contrast.  CONTRAST:  177m OMNIPAQUE IOHEXOL 300 MG/ML  SOLN  COMPARISON:  Plain films 10/05/2014.  CT 08/03/2014.  FINDINGS: Minimal bibasilar atelectasis.  No effusions.  Heart is normal size.  There is pneumoperitoneum. Moderate ascites in the abdomen  and pelvis. Patient is status post colectomy. Ileo rectal anastomosis is noted in the lower pelvis. No localized extraluminal gas noted in the region of the anastomosis.  Small bowel is dilated diffusely to the anastomosis, presumably ileus.  Gallbladder is contracted. Liver, spleen, pancreas, adrenals are unremarkable. There is mild to moderate bilateral hydronephrosis, stable since prior study. Urinary bladder wall appears mildly thickened. Aorta is normal caliber.  No acute bony abnormality or focal bone lesion.  IMPRESSION: Mild-to-moderate pneumoperitoneum and moderate ascites in the abdomen and pelvis. While this could be related to recent postoperative state, it is difficult to completely exclude dehiscence at the anastomosis although no localize sign of extraluminal gas noted at the anastomosis.  Diffuse small bowel dilatation, likely ileus.   Electronically Signed   By: KRolm BaptiseM.D.   On: 10/05/2014 16:53   Dg Abd Acute W/chest  10/05/2014   CLINICAL DATA:  Open abdominal colectomy 09/25/2014. Abdominal pain and distention.  EXAM: ACUTE ABDOMEN SERIES (ABDOMEN 2 VIEW & CHEST 1 VIEW)  COMPARISON:  CT of the abdomen and pelvis 08/03/2014  FINDINGS: Heart size is normal. There is infiltrate within the right upper lobe. No pulmonary edema.  There is free intraperitoneal air beneath the diaphragm. Marked dilatation of small bowel loops is consistent with small bowel obstruction. There is paucity of bowel gas in the pelvis, raising the question of abscess or collapsed loops in the pelvis.  IMPRESSION: 1. Right upper lobe infiltrate.  Suspect infectious process. 2. Moderate free intraperitoneal air. 3. High-grade small bowel obstruction. Consider further evaluation is CT of the abdomen and pelvis with contrast. 4. The salient findings were discussed with Uri Turnbough on 10/05/2014 at 11:42 am.   Electronically Signed   By: ENolon NationsM.D.   On: 10/05/2014 11:56    Medications / Allergies: per  chart  Antibiotics: Anti-infectives    None       Note: Portions of this report may have been transcribed using voice recognition software. Every effort was made to ensure accuracy; however, inadvertent computerized transcription errors may be present.   Any transcriptional errors that result from this process are unintentional.

## 2014-10-07 NOTE — Discharge Summary (Signed)
Physician Discharge Summary  Patient ID: [name] Branch MRN: [id number] DOB/AGE: 1966-01-27 49 y.o.  Admit date: 10/04/2014 Discharge date: 10/07/2014  Patient Care Team: [name], MD as PCP - General (Internal Medicine) Lafayette Dragon, MD as Consulting Physician (Gastroenterology)  Admission Diagnoses: Principal Problem:   Shock Active Problems:   Sarcoidosis   Schizophrenia, unspecified type   Obstructive sleep apnea   Neurogenic bowel   Colonic inertia s/p abdominal colectomy 09/25/2014   Anemia of chronic disease   Postoperative anemia   Discharge Diagnoses:  Principal Problem:   Shock Active Problems:   Sarcoidosis   Schizophrenia, unspecified type   Obstructive sleep apnea   Neurogenic bowel   Colonic inertia s/p abdominal colectomy 09/25/2014   Anemia of chronic disease   Postoperative anemia   POST-OPERATIVE DIAGNOSIS:  * No surgery found *  SURGERY:    SURGEON:    Consults: internal medicine  Hospital Course:   The patient underwent Abdominal colectomy with ileorectal anastomosis.  Postoperatively, the patient gradually mobilized and advanced to a solid diet.  Pain and other symptoms were treated aggressively.  By the time of discharge, the patient was walking well the hallways, eating food, having flatus.  Pain was well-controlled on an oral medications.  Based on meeting discharge criteria and continuing to recover, I felt it was safe for the patient to be discharged from the hospital to further recover with close followup. Postoperative recommendations were discussed in detail.  They are written as well.  Patient had or stenting diarrhea and poor by mouth tolerance.  Was admitted hypotensive.,  IV fluids.  Found to be anemic.  Transfuse 3 units. He was globin improved.  Low but stable between 7 and 8.   Patient denies any abdominal pain.  No peritonitis.  X-ray showed some free air.  CT scan showed so air up in the diaphragms anon down low.  So ileus  resolved.  We'll time of discharge was tolerating solid food with3-6 bowel movements a day.  No severe diarrhea.  Appetite good.  No   No tachycardia.  No hypertension.  Echocardiogram done by medicine.  Concerning for some dilated RIGHT ventricle.  Recommendation of low-dose metoprolol.  Started on that.  I have written for iron as well.  D/C instructions written.  Significant Diagnostic Studies:  Results for orders placed or performed during the hospital encounter of 10/04/14 (from the past 72 hour(s))  CBC with Differential/Platelet     Status: Abnormal   Collection Time: 10/04/14 10:08 AM  Result Value Ref Range   WBC 9.3 4.0 - 10.5 K/uL   RBC 2.56 (L) 4.22 - 5.81 MIL/uL   Hemoglobin 7.6 (L) 13.0 - 17.0 g/dL   HCT 24.0 (L) 39.0 - 52.0 %   MCV 93.8 78.0 - 100.0 fL   MCH 29.7 26.0 - 34.0 pg   MCHC 31.7 30.0 - 36.0 g/dL   RDW 16.4 (H) 11.5 - 15.5 %   Platelets 382 150 - 400 K/uL   Neutrophils Relative % 78 (H) 43 - 77 %   Lymphocytes Relative 15 12 - 46 %   Monocytes Relative 7 3 - 12 %   Eosinophils Relative 0 0 - 5 %   Basophils Relative 0 0 - 1 %   Neutro Abs 7.2 1.7 - 7.7 K/uL   Lymphs Abs 1.4 0.7 - 4.0 K/uL   Monocytes Absolute 0.7 0.1 - 1.0 K/uL   Eosinophils Absolute 0.0 0.0 - 0.7 K/uL   Basophils Absolute  0.0 0.0 - 0.1 K/uL   RBC Morphology POLYCHROMASIA PRESENT   Basic metabolic panel     Status: Abnormal   Collection Time: 10/04/14 10:08 AM  Result Value Ref Range   Sodium 137 135 - 145 mmol/L   Potassium 3.6 3.5 - 5.1 mmol/L   Chloride 102 96 - 112 mmol/L   CO2 28 19 - 32 mmol/L   Glucose, Bld 115 (H) 70 - 99 mg/dL   BUN 30 (H) 6 - 23 mg/dL   Creatinine, Ser 1.19 0.50 - 1.35 mg/dL   Calcium 7.9 (L) 8.4 - 10.5 mg/dL   GFR calc non Af Amer 71 (L) >90 mL/min   GFR calc Af Amer 82 (L) >90 mL/min    Comment: (NOTE) The eGFR has been calculated using the CKD EPI equation. This calculation has not been validated in all clinical situations. eGFR's persistently <90  mL/min signify possible Chronic Kidney Disease.    Anion gap 7 5 - 15  POC occult blood, ED     Status: Abnormal   Collection Time: 10/04/14 11:35 AM  Result Value Ref Range   Fecal Occult Bld POSITIVE (A) NEGATIVE  Iron and TIBC     Status: Abnormal   Collection Time: 10/04/14 11:43 AM  Result Value Ref Range   Iron 106 42 - 165 ug/dL   TIBC 132 (L) 215 - 435 ug/dL   Saturation Ratios 80 (H) 20 - 55 %   UIBC 26 (L) 125 - 400 ug/dL    Comment: Performed at [facility]  Vitamin B12     Status: None   Collection Time: 10/04/14 11:43 AM  Result Value Ref Range   Vitamin B-12 575 211 - 911 pg/mL    Comment: Performed at [facility]  Folate     Status: None   Collection Time: 10/04/14 11:43 AM  Result Value Ref Range   Folate 15.0 ng/mL    Comment: (NOTE) Reference Ranges        Deficient:       0.4 - 3.3 ng/mL        Indeterminate:   3.4 - 5.4 ng/mL        Normal:              > 5.4 ng/mL Performed at [facility]   Prepare RBC     Status: None   Collection Time: 10/04/14 11:52 AM  Result Value Ref Range   Order Confirmation ORDER PROCESSED BY BLOOD BANK   Type and screen for Red Blood Exchange     Status: None (Preliminary result)   Collection Time: 10/04/14 11:52 AM  Result Value Ref Range   ABO/RH(D) A POS    Antibody Screen NEG    Sample Expiration 10/07/2014    Unit Number [id number]    Blood Component Type RED CELLS,LR    Unit division 00    Status of Unit ISSUED,FINAL    Transfusion Status OK TO TRANSFUSE    Crossmatch Result Compatible    Unit Number [id number]    Blood Component Type RED CELLS,LR    Unit division 00    Status of Unit ISSUED,FINAL    Transfusion Status OK TO TRANSFUSE    Crossmatch Result Compatible    Unit Number [id number]    Blood Component Type RED CELLS,LR    Unit division 00    Status of Unit ISSUED,FINAL    Transfusion Status OK TO TRANSFUSE    Crossmatch Result Compatible  Unit Number  [id number]    Blood Component Type RED CELLS,LR    Unit division 00    Status of Unit ALLOCATED    Transfusion Status OK TO TRANSFUSE    Crossmatch Result Compatible    Unit Number [id number]    Blood Component Type RED CELLS,LR    Unit division 00    Status of Unit ALLOCATED    Transfusion Status OK TO TRANSFUSE    Crossmatch Result Compatible   CBC     Status: Abnormal   Collection Time: 10/04/14  6:00 PM  Result Value Ref Range   WBC 8.7 4.0 - 10.5 K/uL   RBC 1.92 (L) 4.22 - 5.81 MIL/uL   Hemoglobin 5.8 (LL) 13.0 - 17.0 g/dL    Comment: REPEATED TO VERIFY CRITICAL RESULT CALLED TO, READ BACK BY AND VERIFIED WITH: AYERS K AT 1925 ON 03.06.2016 BY B HOOKER DELTA CHECK NOTED    HCT 18.2 (L) 39.0 - 52.0 %   MCV 94.8 78.0 - 100.0 fL   MCH 30.2 26.0 - 34.0 pg   MCHC 31.9 30.0 - 36.0 g/dL   RDW 17.2 (H) 11.5 - 15.5 %   Platelets 301 150 - 400 K/uL  Prepare RBC     Status: None   Collection Time: 10/04/14  7:39 PM  Result Value Ref Range   Order Confirmation ORDER PROCESSED BY BLOOD BANK   CBC with Differential/Platelet     Status: Abnormal   Collection Time: 10/05/14  5:20 AM  Result Value Ref Range   WBC 9.5 4.0 - 10.5 K/uL   RBC 2.62 (L) 4.22 - 5.81 MIL/uL   Hemoglobin 7.8 (L) 13.0 - 17.0 g/dL    Comment: DELTA CHECK NOTED POST TRANSFUSION SPECIMEN    HCT 23.5 (L) 39.0 - 52.0 %   MCV 89.7 78.0 - 100.0 fL   MCH 29.8 26.0 - 34.0 pg   MCHC 33.2 30.0 - 36.0 g/dL   RDW 16.9 (H) 11.5 - 15.5 %   Platelets 239 150 - 400 K/uL   Neutrophils Relative % 77 43 - 77 %   Lymphocytes Relative 17 12 - 46 %   Monocytes Relative 6 3 - 12 %   Eosinophils Relative 0 0 - 5 %   Basophils Relative 0 0 - 1 %   Neutro Abs 7.3 1.7 - 7.7 K/uL   Lymphs Abs 1.6 0.7 - 4.0 K/uL   Monocytes Absolute 0.6 0.1 - 1.0 K/uL   Eosinophils Absolute 0.0 0.0 - 0.7 K/uL   Basophils Absolute 0.0 0.0 - 0.1 K/uL   RBC Morphology POLYCHROMASIA PRESENT     Comment: RARE NRBCs   WBC Morphology MILD  LEFT SHIFT (1-5% METAS, OCC MYELO, OCC BANDS)    Smear Review LARGE PLATELETS PRESENT   Cortisol-am, blood     Status: None   Collection Time: 10/05/14  5:20 AM  Result Value Ref Range   Cortisol - AM 11.3 4.3 - 22.4 ug/dL    Comment: Performed at [facility]  Reticulocytes     Status: Abnormal   Collection Time: 10/05/14  9:30 AM  Result Value Ref Range   Retic Ct Pct 5.1 (H) 0.4 - 3.1 %   RBC. 3.18 (L) 4.22 - 5.81 MIL/uL   Retic Count, Manual 162.2 19.0 - 186.0 K/uL  Basic metabolic panel     Status: Abnormal   Collection Time: 10/05/14  9:30 AM  Result Value Ref Range   Sodium 140 135 - 145 mmol/L  Potassium 4.2 3.5 - 5.1 mmol/L   Chloride 109 96 - 112 mmol/L   CO2 25 19 - 32 mmol/L   Glucose, Bld 94 70 - 99 mg/dL   BUN 27 (H) 6 - 23 mg/dL   Creatinine, Ser 0.90 0.50 - 1.35 mg/dL   Calcium 7.9 (L) 8.4 - 10.5 mg/dL   GFR calc non Af Amer >90 >90 mL/min   GFR calc Af Amer >90 >90 mL/min    Comment: (NOTE) The eGFR has been calculated using the CKD EPI equation. This calculation has not been validated in all clinical situations. eGFR's persistently <90 mL/min signify possible Chronic Kidney Disease.    Anion gap 6 5 - 15  Basic metabolic panel     Status: Abnormal   Collection Time: 10/06/14  3:40 AM  Result Value Ref Range   Sodium 137 135 - 145 mmol/L   Potassium 3.9 3.5 - 5.1 mmol/L   Chloride 109 96 - 112 mmol/L   CO2 25 19 - 32 mmol/L   Glucose, Bld 102 (H) 70 - 99 mg/dL   BUN 13 6 - 23 mg/dL    Comment: DELTA CHECK NOTED REPEATED TO VERIFY    Creatinine, Ser 0.65 0.50 - 1.35 mg/dL   Calcium 7.7 (L) 8.4 - 10.5 mg/dL   GFR calc non Af Amer >90 >90 mL/min   GFR calc Af Amer >90 >90 mL/min    Comment: (NOTE) The eGFR has been calculated using the CKD EPI equation. This calculation has not been validated in all clinical situations. eGFR's persistently <90 mL/min signify possible Chronic Kidney Disease.    Anion gap 3 (L) 5 - 15  Hemoglobin      Status: Abnormal   Collection Time: 10/06/14  3:40 AM  Result Value Ref Range   Hemoglobin 8.2 (L) 13.0 - 17.0 g/dL  Hepatic function panel     Status: Abnormal   Collection Time: 10/06/14  3:40 AM  Result Value Ref Range   Total Protein 4.8 (L) 6.0 - 8.3 g/dL   Albumin 2.4 (L) 3.5 - 5.2 g/dL   AST 22 0 - 37 U/L   ALT 25 0 - 53 U/L   Alkaline Phosphatase 46 39 - 117 U/L   Total Bilirubin 0.4 0.3 - 1.2 mg/dL   Bilirubin, Direct 0.1 0.0 - 0.5 mg/dL   Indirect Bilirubin 0.3 0.3 - 0.9 mg/dL  Lipase, blood     Status: None   Collection Time: 10/06/14  3:40 AM  Result Value Ref Range   Lipase 46 11 - 59 U/L  Clostridium Difficile by PCR     Status: None   Collection Time: 10/06/14  8:23 PM  Result Value Ref Range   C difficile by pcr NEGATIVE NEGATIVE  Basic metabolic panel     Status: Abnormal   Collection Time: 10/07/14  5:38 AM  Result Value Ref Range   Sodium 140 135 - 145 mmol/L   Potassium 4.0 3.5 - 5.1 mmol/L   Chloride 109 96 - 112 mmol/L   CO2 27 19 - 32 mmol/L   Glucose, Bld 85 70 - 99 mg/dL   BUN 9 6 - 23 mg/dL   Creatinine, Ser 0.70 0.50 - 1.35 mg/dL   Calcium 8.0 (L) 8.4 - 10.5 mg/dL   GFR calc non Af Amer >90 >90 mL/min   GFR calc Af Amer >90 >90 mL/min    Comment: (NOTE) The eGFR has been calculated using the CKD EPI equation. This calculation has not  been validated in all clinical situations. eGFR's persistently <90 mL/min signify possible Chronic Kidney Disease.    Anion gap 4 (L) 5 - 15  CBC with Differential/Platelet     Status: Abnormal   Collection Time: 10/07/14  5:38 AM  Result Value Ref Range   WBC 11.5 (H) 4.0 - 10.5 K/uL   RBC 2.49 (L) 4.22 - 5.81 MIL/uL   Hemoglobin 7.5 (L) 13.0 - 17.0 g/dL   HCT 23.1 (L) 39.0 - 52.0 %   MCV 92.8 78.0 - 100.0 fL   MCH 30.1 26.0 - 34.0 pg   MCHC 32.5 30.0 - 36.0 g/dL   RDW 19.2 (H) 11.5 - 15.5 %   Platelets 303 150 - 400 K/uL   Neutrophils Relative % 84 (H) 43 - 77 %   Neutro Abs 9.6 (H) 1.7 - 7.7 K/uL    Lymphocytes Relative 12 12 - 46 %   Lymphs Abs 1.4 0.7 - 4.0 K/uL   Monocytes Relative 3 3 - 12 %   Monocytes Absolute 0.4 0.1 - 1.0 K/uL   Eosinophils Relative 1 0 - 5 %   Eosinophils Absolute 0.1 0.0 - 0.7 K/uL   Basophils Relative 0 0 - 1 %   Basophils Absolute 0.0 0.0 - 0.1 K/uL    Ct Abdomen Pelvis W Contrast  10/05/2014   CLINICAL DATA:  Abdominal distention. Colectomy 9 days ago. Pneumoperitoneum on plain films.  EXAM: CT ABDOMEN AND PELVIS WITH CONTRAST  TECHNIQUE: Multidetector CT imaging of the abdomen and pelvis was performed using the standard protocol following bolus administration of intravenous contrast.  CONTRAST:  179m OMNIPAQUE IOHEXOL 300 MG/ML  SOLN  COMPARISON:  Plain films 10/05/2014.  CT 08/03/2014.  FINDINGS: Minimal bibasilar atelectasis.  No effusions.  Heart is normal size.  There is pneumoperitoneum. Moderate ascites in the abdomen and pelvis. Patient is status post colectomy. Ileo rectal anastomosis is noted in the lower pelvis. No localized extraluminal gas noted in the region of the anastomosis.  Small bowel is dilated diffusely to the anastomosis, presumably ileus.  Gallbladder is contracted. Liver, spleen, pancreas, adrenals are unremarkable. There is mild to moderate bilateral hydronephrosis, stable since prior study. Urinary bladder wall appears mildly thickened. Aorta is normal caliber.  No acute bony abnormality or focal bone lesion.  IMPRESSION: Mild-to-moderate pneumoperitoneum and moderate ascites in the abdomen and pelvis. While this could be related to recent postoperative state, it is difficult to completely exclude dehiscence at the anastomosis although no localize sign of extraluminal gas noted at the anastomosis.  Diffuse small bowel dilatation, likely ileus.   Electronically Signed   By: KRolm BaptiseM.D.   On: 10/05/2014 16:53   Dg Abd Acute W/chest  10/05/2014   CLINICAL DATA:  Open abdominal colectomy 09/25/2014. Abdominal pain and distention.  EXAM:  ACUTE ABDOMEN SERIES (ABDOMEN 2 VIEW & CHEST 1 VIEW)  COMPARISON:  CT of the abdomen and pelvis 08/03/2014  FINDINGS: Heart size is normal. There is infiltrate within the right upper lobe. No pulmonary edema.  There is free intraperitoneal air beneath the diaphragm. Marked dilatation of small bowel loops is consistent with small bowel obstruction. There is paucity of bowel gas in the pelvis, raising the question of abscess or collapsed loops in the pelvis.  IMPRESSION: 1. Right upper lobe infiltrate.  Suspect infectious process. 2. Moderate free intraperitoneal air. 3. High-grade small bowel obstruction. Consider further evaluation is CT of the abdomen and pelvis with contrast. 4. The salient findings were discussed with  Makye Radle on 10/05/2014 at 11:42 am.   Electronically Signed   By: Nolon Nations M.D.   On: 10/05/2014 11:56    Discharge Exam: Blood pressure 117/70, pulse 100, temperature 98 F (36.7 C), temperature source Oral, resp. rate 17, height _0  (1.88 m), weight 178 lb 9.2 oz (81 kg), SpO2 100 %.  General: Pt awake/alert/oriented x4 in no major acute distress Eyes: PERRL, normal EOM. Sclera nonicteric Neuro: CN II-XII intact w/o focal sensory/motor deficits. Lymph: No head/neck/groin lymphadenopathy Psych:  No delerium/psychosis/paranoia HENT: Normocephalic, Mucus membranes moist.  No thrush Neck: Supple, No tracheal deviation Chest: No pain.  Good respiratory excursion. CV:  Pulses intact.  Regular rhythm MS: Normal AROM mjr joints.  No obvious deformity Abdomen: Soft, Mildly distended but improved.  Nontender.  No RT/guarding.  No peritonitis.  No incarcerated hernias. Ext:  SCDs BLE.  No significant edema.  No cyanosis Skin: No petechiae / purpura  Discharged Condition: good   Past Medical History  Diagnosis Date  . Colonic inertia   . Hyperlipidemia   . Constipation   . Sarcoidosis   . 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 Schizophrenia   . Mental retardation   .  Cardiomyopathy   . Glaucoma   . Obstruction of colon     recurrent  . ABSCESS, ANAL/RECTAL REGIONS 08/18/2006    Annotation: Fournier gangrene Qualifier: History of  By: Garnette Scheuermann MD, Arlee Muslim    . 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    Annotation: chronic with stercoral ulcer  Qualifier: Diagnosis of  By: Garnette Scheuermann MD, Arlee Muslim      Past Surgical History  Procedure Laterality Date  . Groin surgery      boil drained  . Colectomy N/A 09/25/2014    Procedure: OPEN ABDOMINAL COLECTOMY;  Surgeon: Michael Boston, MD;  Location: [facility];  Service: General;  Laterality: N/A;  . Proctoscopy N/A 09/25/2014    Procedure: RIGID PROCTOSCOPY;  Surgeon: Michael Boston, MD;  Location: [facility];  Service: General;  Laterality: N/A;    History   Social History  . Marital Status: Single    Spouse Name: N/A  . Number of Children: 0  . Years of Education: N/A   Occupational History  .     Social History Main Topics  . Smoking status: Never Smoker   . Smokeless tobacco: Never Used  . Alcohol Use: No  . Drug Use: No  . Sexual Activity: Not on file     Comment:     Other Topics Concern  . Not on file   Social History Narrative   Has a caretaker    Family History  Problem Relation Age of Onset  . Diabetes Mother   . Colon cancer Neg Hx     Current Facility-Administered Medications  Medication Dose Route Frequency Provider Last Rate Last Dose  . acetaminophen (TYLENOL) tablet 650 mg  650 mg Oral Q6H PRN Alphonsa Overall, MD      . bismuth subsalicylate (PEPTO BISMOL) 262 MG/15ML suspension 30 mL  30 mL Oral BID Michael Boston, MD   30 mL at 10/06/14 1800  . bismuth subsalicylate (PEPTO BISMOL) 262 MG/15ML suspension 30 mL  30 mL Oral Q8H PRN Michael Boston, MD   30 mL at 10/06/14 0920  . cloZAPine (CLOZARIL) tablet 125 mg  125 mg Oral BID Michael Boston, MD   125 mg at 10/06/14 2226  . feeding supplement (ENSURE COMPLETE) (ENSURE COMPLETE) liquid 237 mL  237 mL Oral BID BM  Michael Boston, MD   237 mL at 10/06/14 1420  .  ferrous sulfate tablet 325 mg  325 mg Oral TID WC Michael Boston, MD      . fluvoxaMINE (LUVOX) tablet 300 mg  300 mg Oral QHS Alphonsa Overall, MD   300 mg at 10/06/14 2227  . HYDROcodone-acetaminophen (NORCO/VICODIN) 5-325 MG per tablet 1-2 tablet  1-2 tablet Oral Q4H PRN Alphonsa Overall, MD      . latanoprost (XALATAN) 0.005 % ophthalmic solution 1 drop  1 drop Both Eyes QHS Alphonsa Overall, MD   1 drop at 10/06/14 2227  . metoprolol succinate (TOPROL-XL) 24 hr tablet 12.5 mg  12.5 mg Oral Daily Michael Boston, MD      . multivitamin with minerals tablet 1 tablet  1 tablet Oral Daily Michael Boston, MD   1 tablet at 10/06/14 [id number]  . pravastatin (PRAVACHOL) tablet 20 mg  20 mg Oral Daily Michael Boston, MD   20 mg at 10/06/14 7654  . saccharomyces boulardii (FLORASTOR) capsule 250 mg  250 mg Oral BID Michael Boston, MD   250 mg at 10/06/14 2227     No Known Allergies  Disposition: 01-Home or Self Care     Medication List    TAKE these medications        cloZAPine 25 MG tablet  Commonly known as:  CLOZARIL  Take 25 mg by mouth 2 (two) times daily.     cloZAPine 100 MG tablet  Commonly known as:  CLOZARIL  Take 100 mg by mouth 2 (two) times daily.     ENSURE COMPLETE SHAKE Liqd  Take 1 Can by mouth 2 (two) times daily.     fluvoxaMINE 100 MG tablet  Commonly known as:  LUVOX  Take 300 mg by mouth at bedtime.     metoprolol succinate 25 MG 24 hr tablet  Commonly known as:  TOPROL-XL  Take 0.5 tablets (12.5 mg total) by mouth daily.     multivitamin capsule  Take 1 capsule by mouth daily.     oxyCODONE 5 MG immediate release tablet  Commonly known as:  Oxy IR/ROXICODONE  Take 1-2 tablets (5-10 mg total) by mouth every 4 (four) hours as needed for moderate pain, severe pain or breakthrough pain.     polyethylene glycol packet  Commonly known as:  MIRALAX / GLYCOLAX  Take 8.5 g by mouth 2 (two) times daily.     pravastatin 40 MG tablet  Commonly known as:  PRAVACHOL  TAKE ONE TABLET EACH  DAY     travoprost (benzalkonium) 0.004 % ophthalmic solution  Commonly known as:  TRAVATAN  Place 1 drop into both eyes at bedtime.          Signed: Morton Peters, M.D., F.A.C.S. [facility] [address] [place], [place] [address] [phone] Main / Paging   10/07/2014, 8:38 AM

## 2014-10-07 NOTE — Progress Notes (Signed)
[facility]                                                                              Patient Demographics  [name] Branch, is a 49 y.o. male, DOB - Jan 30, 1966, OZH:[id number]  Admit date - 10/04/2014   Admitting Physician [name] Boston, MD  Outpatient Primary MD for the patient is [name] Chiquito, MD  LOS - 3   No chief complaint on file.     Consult for hypotension.   Assessment & Plan   Hypotension/Syncope/Acute blood loss anemia -Secondary to Acute blood loss/hypovolemia, complicated poor oral intake  -Improved -Patient was given 2uPRBCs on 3/6 (Hb 5.8), currently 7.5 -May benefit from an additional unit of blood before discharge -Random am cortisol normal (unlikely Addison's) -Would repeat CBC within 1 week of discharge  Splenomegaly secondary to Sarcoidosis -CT scan (Chest ) 08/03/2014: tiny nodule in the lung, 30mm low density inferior right liver lesion- thought to be benign -No further workup at this time  Schizophrenia, unspecified type complicated by moderate mental retardation -Continue clozapine, please keep in mind this may also cause anti-cholinergic effects -Continue Luvox at bedtime -Patient will need to follow-up with his psychiatrist upon discharge  Obstructive sleep apnea -Patient does need to use CPAP however question whether patient is willing to comply  Borderline Cardiomyopathy + tachycardia  -Possibly secondary to hypovolemia -Continue metoprolol -EF 62-95%, grade 1 diastolic dysfunction -TSH normal  Hyperlipidemia -Continue statin  Impaired glucose tolerance -hemoglobin A1c 6.0  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 Would consider transfusing another unit before discharged- however, will leave that to the discretion of primary service.   Time Spent in minutes   30  minutes  Procedures  Open abdominal colectomy, Rigid Proctoscopy  Consults   TRH  DVT Prophylaxis  SCDs  Lab Results  Component Value Date   PLT 303 10/07/2014    Medications  Scheduled Meds: . bismuth subsalicylate  30 mL Oral BID  . cloZAPine  125 mg Oral BID  . feeding supplement (ENSURE COMPLETE)  237 mL Oral BID BM  . ferrous sulfate  325 mg Oral TID WC  . fluvoxaMINE  300 mg Oral QHS  . latanoprost  1 drop Both Eyes QHS  . metoprolol succinate  12.5 mg Oral Daily  . multivitamin with minerals  1 tablet Oral Daily  . pravastatin  20 mg Oral Daily  . saccharomyces boulardii  250 mg Oral BID   Continuous Infusions:  PRN Meds:.acetaminophen **OR** [DISCONTINUED] acetaminophen, bismuth subsalicylate, HYDROcodone-acetaminophen  Antibiotics    Anti-infectives    None        Subjective:   [name] Branch seen and examined today. Patient states he's feeling well today. He inquires about going home. Patient denies any abdominal pain, nausea, vomiting.  Objective:   Filed Vitals:   10/06/14 1400 10/06/14 2230 10/07/14 0100 10/07/14 0500  BP: 118/72 87/52 95/50  117/70  Pulse: 90 100  100  Temp: 98.6 F (37 C) 98.4 F (36.9 C)  98 F (36.7 C)  TempSrc: Oral Oral  Oral  Resp:  18 16  17   Height:      Weight:      SpO2: 100% 100%  100%    Wt Readings from Last 3 Encounters:  10/06/14 81 kg (178 lb 9.2 oz)  09/30/14 78.1 kg (172 lb 2.9 oz)  09/16/14 80.287 kg (177 lb)     Intake/Output Summary (Last 24 hours) at 10/07/14 0957 Last data filed at 10/07/14 0600  Gross per 24 hour  Intake   1480 ml  Output      0 ml  Net   1480 ml    Exam  General: Well developed, well nourished, NAD, appears stated age  HEENT: NCAT, mucous membranes moist.   Cardiovascular: S1 S2 auscultated, RRR  Respiratory: Clear to auscultation bilaterally with equal chest rise  Abdomen: Soft, midline scar, well-healed. Slightly distended, nontender  Extremities: warm dry  without cyanosis clubbing or edema  Neuro: Awake and alert, able to move all 4 extremities with ease  Psych: Appropriate   Data Review   Micro Results Recent Results (from the past 240 hour(s))  Clostridium Difficile by PCR     Status: None   Collection Time: 10/06/14  8:23 PM  Result Value Ref Range Status   C difficile by pcr NEGATIVE NEGATIVE Final    Radiology Reports Ct Abdomen Pelvis W Contrast  10/05/2014   CLINICAL DATA:  Abdominal distention. Colectomy 9 days ago. Pneumoperitoneum on plain films.  EXAM: CT ABDOMEN AND PELVIS WITH CONTRAST  TECHNIQUE: Multidetector CT imaging of the abdomen and pelvis was performed using the standard protocol following bolus administration of intravenous contrast.  CONTRAST:  192mL OMNIPAQUE IOHEXOL 300 MG/ML  SOLN  COMPARISON:  Plain films 10/05/2014.  CT 08/03/2014.  FINDINGS: Minimal bibasilar atelectasis.  No effusions.  Heart is normal size.  There is pneumoperitoneum. Moderate ascites in the abdomen and pelvis. Patient is status post colectomy. Ileo rectal anastomosis is noted in the lower pelvis. No localized extraluminal gas noted in the region of the anastomosis.  Small bowel is dilated diffusely to the anastomosis, presumably ileus.  Gallbladder is contracted. Liver, spleen, pancreas, adrenals are unremarkable. There is mild to moderate bilateral hydronephrosis, stable since prior study. Urinary bladder wall appears mildly thickened. Aorta is normal caliber.  No acute bony abnormality or focal bone lesion.  IMPRESSION: Mild-to-moderate pneumoperitoneum and moderate ascites in the abdomen and pelvis. While this could be related to recent postoperative state, it is difficult to completely exclude dehiscence at the anastomosis although no localize sign of extraluminal gas noted at the anastomosis.  Diffuse small bowel dilatation, likely ileus.   Electronically Signed   By: Rolm Baptise M.D.   On: 10/05/2014 16:53   Dg Abd Acute W/chest  10/05/2014    CLINICAL DATA:  Open abdominal colectomy 09/25/2014. Abdominal pain and distention.  EXAM: ACUTE ABDOMEN SERIES (ABDOMEN 2 VIEW & CHEST 1 VIEW)  COMPARISON:  CT of the abdomen and pelvis 08/03/2014  FINDINGS: Heart size is normal. There is infiltrate within the right upper lobe. No pulmonary edema.  There is free intraperitoneal air beneath the diaphragm. Marked dilatation of small bowel loops is consistent with small bowel obstruction. There is paucity of bowel gas in the pelvis, raising the question of abscess or collapsed loops in the pelvis.  IMPRESSION: 1. Right upper lobe infiltrate.  Suspect infectious process. 2. Moderate free intraperitoneal air. 3. High-grade small bowel obstruction. Consider further evaluation is CT of the abdomen and pelvis with contrast. 4. The salient findings were discussed  with STEVEN GROSS on 10/05/2014 at 11:42 am.   Electronically Signed   By: Nolon Nations M.D.   On: 10/05/2014 11:56    CBC  Recent Labs Lab 10/04/14 1008 10/04/14 1800 10/05/14 0520 10/06/14 0340 10/07/14 0538  WBC 9.3 8.7 9.5  --  11.5*  HGB 7.6* 5.8* 7.8* 8.2* 7.5*  HCT 24.0* 18.2* 23.5*  --  23.1*  PLT 382 301 239  --  303  MCV 93.8 94.8 89.7  --  92.8  MCH 29.7 30.2 29.8  --  30.1  MCHC 31.7 31.9 33.2  --  32.5  RDW 16.4* 17.2* 16.9*  --  19.2*  LYMPHSABS 1.4  --  1.6  --  1.4  MONOABS 0.7  --  0.6  --  0.4  EOSABS 0.0  --  0.0  --  0.1  BASOSABS 0.0  --  0.0  --  0.0    Chemistries   Recent Labs Lab 10/04/14 1008 10/05/14 0930 10/06/14 0340 10/07/14 0538  NA 137 140 137 140  K 3.6 4.2 3.9 4.0  CL 102 109 109 109  CO2 28 25 25 27   GLUCOSE 115* 94 102* 85  BUN 30* 27* 13 9  CREATININE 1.19 0.90 0.65 0.70  CALCIUM 7.9* 7.9* 7.7* 8.0*  AST  --   --  22  --   ALT  --   --  25  --   ALKPHOS  --   --  46  --   BILITOT  --   --  0.4  --    ------------------------------------------------------------------------------------------------------------------ estimated  creatinine clearance is 129.4 mL/min (by C-G formula based on Cr of 0.7). ------------------------------------------------------------------------------------------------------------------ No results for input(s): HGBA1C in the last 72 hours. ------------------------------------------------------------------------------------------------------------------ No results for input(s): CHOL, HDL, LDLCALC, TRIG, CHOLHDL, LDLDIRECT in the last 72 hours. ------------------------------------------------------------------------------------------------------------------ No results for input(s): TSH, T4TOTAL, T3FREE, THYROIDAB in the last 72 hours.  Invalid input(s): FREET3 ------------------------------------------------------------------------------------------------------------------  Recent Labs  10/04/14 1143 10/05/14 0930  VITAMINB12 575  --   FOLATE 15.0  --   TIBC 132*  --   IRON 106  --   RETICCTPCT  --  5.1*    Coagulation profile No results for input(s): INR, PROTIME in the last 168 hours.  No results for input(s): DDIMER in the last 72 hours.  Cardiac Enzymes No results for input(s): CKMB, TROPONINI, MYOGLOBIN in the last 168 hours.  Invalid input(s): CK ------------------------------------------------------------------------------------------------------------------ Invalid input(s): POCBNP    Chip Canepa D.O. on 10/07/2014 at 9:57 AM  Between 7am to 7pm - Pager - [phone]  After 7pm go to [email or web address] - password [email or web address]  And look for the night coverage person covering for me after hours  [facility] Office  [phone]

## 2014-10-07 NOTE — Progress Notes (Signed)
Discussed with patient caregiver discharge instructions, she verbalized agreement and understanding.  Patient's IV was discontinued with no complications.  Patient to go down in wheelchair with all belongings to go home in private vehicle.

## 2014-10-08 LAB — TYPE AND SCREEN
ABO/RH(D): A POS
ANTIBODY SCREEN: NEGATIVE
UNIT DIVISION: 0
UNIT DIVISION: 0
Unit division: 0
Unit division: 0
Unit division: 0

## 2014-10-08 MED ORDER — FERROUS SULFATE 325 (65 FE) MG PO TABS: 325.0000 mg | ORAL_TABLET | Freq: Every day | ORAL | Status: DC

## 2014-10-20 ENCOUNTER — Ambulatory Visit: Payer: Medicare Other | Admitting: Internal Medicine

## 2014-12-21 ENCOUNTER — Encounter: Payer: Self-pay | Admitting: *Deleted

## 2014-12-22 ENCOUNTER — Ambulatory Visit (INDEPENDENT_AMBULATORY_CARE_PROVIDER_SITE_OTHER): Payer: Medicare Other | Admitting: Internal Medicine

## 2014-12-22 ENCOUNTER — Other Ambulatory Visit: Payer: Self-pay | Admitting: *Deleted

## 2014-12-22 ENCOUNTER — Other Ambulatory Visit (INDEPENDENT_AMBULATORY_CARE_PROVIDER_SITE_OTHER): Payer: Medicare Other

## 2014-12-22 ENCOUNTER — Encounter: Payer: Self-pay | Admitting: Internal Medicine

## 2014-12-22 VITALS — BP 118/64 | HR 76 | Ht 74.0 in | Wt 170.0 lb

## 2014-12-22 DIAGNOSIS — Z9889 Other specified postprocedural states: Secondary | ICD-10-CM

## 2014-12-22 DIAGNOSIS — Z9049 Acquired absence of other specified parts of digestive tract: Secondary | ICD-10-CM

## 2014-12-22 DIAGNOSIS — K5660 Unspecified intestinal obstruction: Secondary | ICD-10-CM | POA: Diagnosis not present

## 2014-12-22 LAB — COMPREHENSIVE METABOLIC PANEL
ALT: 15 U/L (ref 0–53)
AST: 18 U/L (ref 0–37)
Albumin: 4.2 g/dL (ref 3.5–5.2)
Alkaline Phosphatase: 82 U/L (ref 39–117)
BILIRUBIN TOTAL: 0.5 mg/dL (ref 0.2–1.2)
BUN: 11 mg/dL (ref 6–23)
CHLORIDE: 105 meq/L (ref 96–112)
CO2: 28 mEq/L (ref 19–32)
CREATININE: 0.93 mg/dL (ref 0.40–1.50)
Calcium: 9.3 mg/dL (ref 8.4–10.5)
GFR: 111.37 mL/min (ref >60.00)
GLUCOSE: 75 mg/dL (ref 70–99)
Potassium: 4 mEq/L (ref 3.5–5.1)
Sodium: 138 mEq/L (ref 135–145)
Total Protein: 7.5 g/dL (ref 6.0–8.3)

## 2014-12-22 LAB — CBC WITH DIFFERENTIAL/PLATELET
Basophils Absolute: 0 10*3/uL (ref 0.0–0.1)
Basophils Relative: 0.4 % (ref 0.0–3.0)
EOS ABS: 0 10*3/uL (ref 0.0–0.7)
Eosinophils Relative: 0.3 % (ref 0.0–5.0)
HEMATOCRIT: 42.8 % (ref 39.0–52.0)
HEMOGLOBIN: 14.1 g/dL (ref 13.0–17.0)
Lymphocytes Relative: 23 % (ref 12.0–46.0)
Lymphs Abs: 1 10*3/uL (ref 0.7–4.0)
MCHC: 33 g/dL (ref 30.0–36.0)
MCV: 91.1 fl (ref 78.0–100.0)
Monocytes Absolute: 0.4 10*3/uL (ref 0.1–1.0)
Monocytes Relative: 8.7 % (ref 3.0–12.0)
NEUTROS ABS: 2.9 10*3/uL (ref 1.4–7.7)
NEUTROS PCT: 67.6 % (ref 43.0–77.0)
Platelets: 149 10*3/uL — ABNORMAL LOW (ref 150.0–400.0)
RBC: 4.69 Mil/uL (ref 4.22–5.81)
RDW: 15.4 % (ref 11.5–15.5)
WBC: 4.4 10*3/uL (ref 4.0–10.5)

## 2014-12-22 LAB — VITAMIN B12: Vitamin B-12: 786 pg/mL (ref 211–911)

## 2014-12-22 LAB — IBC PANEL
Iron: 61 ug/dL (ref 42–165)
SATURATION RATIOS: 20.6 % (ref 20.0–50.0)
Transferrin: 212 mg/dL (ref 212.0–360.0)

## 2014-12-22 MED ORDER — POLYETHYLENE GLYCOL 3350 17 G PO PACK: 8.5000 g | PACK | Freq: Two times a day (BID) | ORAL | Status: DC

## 2014-12-22 NOTE — Progress Notes (Signed)
[name] Branch 12/12/1965 [id number]  Note: This dictation was prepared with Dragon digital system. Any transcriptional errors that result from this procedure are unintentional.   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 Johney Maine . We have been following him for chronic obstipation resulting in chronic colon: obstruction. Prior to his hospitalization he was on a very complex laxative  regimen. He is doing well now, 3 months after the surgery, taking MiraLAX 7.5 g daily. Resulting in 2-3 soft bowel movements a day. He denies abdominal pain,incontinence or rectal leakage. His appetite has been good. He is very happy about results of  the surgery.  Past Medical History  Diagnosis Date  . Colonic inertia   . Hyperlipidemia   . Constipation   . Sarcoidosis   . 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 Schizophrenia   . Mental retardation   . Cardiomyopathy   . Glaucoma   . Obstruction of colon     recurrent  . ABSCESS, ANAL/RECTAL REGIONS 08/18/2006    Annotation: Fournier gangrene Qualifier: History of  By: Garnette Scheuermann MD, Arlee Muslim    . 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    Annotation: chronic with stercoral ulcer  Qualifier: Diagnosis of  By: Garnette Scheuermann MD, Arlee Muslim    . Hypovolemic shock     Past Surgical History  Procedure Laterality Date  . Groin surgery      boil drained  . Colectomy N/A 09/25/2014    Procedure: OPEN ABDOMINAL COLECTOMY;  Surgeon: Michael Boston, MD;  Location: [facility];  Service: General;  Laterality: N/A;  . Proctoscopy N/A 09/25/2014    Procedure: RIGID PROCTOSCOPY;  Surgeon: Michael Boston, MD;  Location: [facility];  Service: General;  Laterality: N/A;    No Known Allergies  Family history and social history have been reviewed.  Review of Systems: Negative for rectal bleeding. Weight stable at around 70 pounds  The remainder of the 10 point ROS is negative except as outlined in  the H&P  Physical Exam: General Appearance thin, in no distress Eyes  Non icteric  HEENT  Non traumatic, normocephalic  Mouth No lesion, tongue papillated, no cheilosis Neck Supple without adenopathy, thyroid not enlarged, no carotid bruits, no JVD Lungs Clear to auscultation bilaterally COR Normal S1, normal S2, regular rhythm, no murmur, quiet precordium Abdomen Fluid. Soft. Nontender. Well-healed surgical scar, soft bowel sounds. No tympany Rectal not done Extremities  No pedal edema Skin No lesions Neurological Alert and oriented x 3 Psychological Normal mood and affect, oddly retarded. Ounces simple questions  Assessment and Plan:   49 year old African-American male who underwent total colectomy for megacolon which was not functioning and was causing chronic obstruction. He still has mild dilatation of the small bowel on CT scan of the abdomen Patient was having obstructive symptoms. He is now doing much better after ileoproctostomy. He may discontinue MiraLAX altogether and take it only on when necessary basis. We will also discontinue metoprolol since his blood pressure is 767 systolic  We will obtain blood count, metabolic panel, iron studies and B12 today. He may be able to discontinue  iron supplements depending on the blood count today.I will see him again in 6 months.    [name] Branch 12/22/2014

## 2014-12-22 NOTE — Patient Instructions (Addendum)
Go to the basement for labs today Discontinue Metoprolol  Discontinue Nuprin Follow up in 6 months Dr Daryll Drown, Jerilynn Mages.[facility], Dr Michael Boston.

## 2015-04-05 ENCOUNTER — Other Ambulatory Visit: Payer: Self-pay | Admitting: Internal Medicine

## 2015-04-06 NOTE — Telephone Encounter (Signed)
Have tried to call pharm twice today, phone seems to be out of order

## 2015-04-07 ENCOUNTER — Other Ambulatory Visit: Payer: Self-pay | Admitting: Internal Medicine

## 2015-04-07 NOTE — Telephone Encounter (Signed)
Pt called requesting pravastatin to be filled.

## 2015-04-08 NOTE — Telephone Encounter (Signed)
Called to pharm 

## 2015-04-08 NOTE — Telephone Encounter (Signed)
Already done

## 2015-04-21 ENCOUNTER — Ambulatory Visit (INDEPENDENT_AMBULATORY_CARE_PROVIDER_SITE_OTHER): Payer: Medicare Other | Admitting: Internal Medicine

## 2015-04-21 ENCOUNTER — Encounter: Payer: Self-pay | Admitting: Internal Medicine

## 2015-04-21 VITALS — BP 124/71 | HR 102 | Temp 98.0°F | Wt 174.8 lb

## 2015-04-21 DIAGNOSIS — Z23 Encounter for immunization: Secondary | ICD-10-CM | POA: Diagnosis not present

## 2015-04-21 DIAGNOSIS — E785 Hyperlipidemia, unspecified: Secondary | ICD-10-CM

## 2015-04-21 DIAGNOSIS — H409 Unspecified glaucoma: Secondary | ICD-10-CM

## 2015-04-21 DIAGNOSIS — K599 Functional intestinal disorder, unspecified: Secondary | ICD-10-CM | POA: Diagnosis not present

## 2015-04-21 DIAGNOSIS — I5022 Chronic systolic (congestive) heart failure: Secondary | ICD-10-CM

## 2015-04-21 DIAGNOSIS — D649 Anemia, unspecified: Secondary | ICD-10-CM

## 2015-04-21 DIAGNOSIS — R161 Splenomegaly, not elsewhere classified: Secondary | ICD-10-CM | POA: Diagnosis not present

## 2015-04-21 DIAGNOSIS — I10 Essential (primary) hypertension: Secondary | ICD-10-CM

## 2015-04-21 MED ORDER — PRAVASTATIN SODIUM 40 MG PO TABS: ORAL_TABLET | ORAL | Status: DC

## 2015-04-21 MED ORDER — MULTIVITAMINS PO CAPS: 1.0000 | ORAL_CAPSULE | Freq: Every day | ORAL | Status: DC

## 2015-04-21 NOTE — Assessment & Plan Note (Addendum)
This is based on a TTE from this year.  Thought to be non-ischemic in nature.  He is completely asymptomatic, without chest pain, SOB, LE edema.  Continue to monitor  Plan Risk factor modification Continue Statin, discuss increase to high intensity statin at next visit Good blood pressure control (124/71 today), continue metoprolol Consider screening for Diabetes at next visit.

## 2015-04-21 NOTE — Assessment & Plan Note (Signed)
Follows at [facility] with good results.  Continue.

## 2015-04-21 NOTE — Progress Notes (Signed)
   Subjective:    Patient ID: [name] Branch, male    DOB: [birth year], 49 y.o.   MRN: [id number]  CC: follow up post surgery    HPI  [name] Branch is a 49yo man with PMH of MR, schizophrenia, HLD, HTN, glaucoma, neurogenic bowel who underwent colectomy recently.    [name] and his caretaker report that he is doing significantly better since the surgery.  He is "like a new person."  They report that he is having a bowel movement once a day and is following with his GI specialists.  He has no complaints at this time.  He was able to return to work very quickly.   He has HLD and last check his LDL was 130, which is likely at goal for him.  He does not have a CAD equivalent such as DM.  He reports no symptoms and no issues taking his medications.  He is due for a refill on pravastatin.   He has glaucoma for which he is followed at [facility].  He uses eye drops.  Reports that his vision is fine.  No blurry vision, no vision loss, no eye pain.  He has chronically injected sclerae.    Other issues include anemia of chronic disease, however, at last check, his H/H were normal.    He is not smoking.    He did not bring in his medications.  He is requesting a flu shot.    Review of Systems  Constitutional: Negative for fever, chills and activity change.  HENT: Negative for ear discharge and ear pain.   Eyes: Negative for photophobia and visual disturbance.  Respiratory: Negative for cough and choking.   Cardiovascular: Negative for chest pain and leg swelling.  Gastrointestinal: Negative for abdominal pain, diarrhea, constipation and abdominal distention.  Genitourinary: Negative for enuresis and difficulty urinating.  Musculoskeletal: Negative for back pain and arthralgias.  Skin: Negative for rash and wound.  Neurological: Negative for dizziness, weakness and light-headedness.       Objective:   Physical Exam  Constitutional: He is oriented to person, place, and time.  Thin  man, NAD  HENT:  Head: Normocephalic and atraumatic.  Eyes: EOM are normal. No scleral icterus.  Injected sclerae, somewhat proptotic  Cardiovascular: Normal rate and regular rhythm.   No murmur heard. Pulmonary/Chest: Effort normal and breath sounds normal. No respiratory distress. He has no wheezes.  Abdominal: Soft. Bowel sounds are normal. There is no tenderness.  Musculoskeletal: He exhibits no edema or tenderness.  Neurological: He is alert and oriented to person, place, and time. He exhibits normal muscle tone.  Skin: Skin is warm and dry.  Psychiatric: He has a normal mood and affect. His behavior is normal.  Nursing note and vitals reviewed.   No labs today      Assessment & Plan:  RTC in 6 months.

## 2015-04-21 NOTE — Assessment & Plan Note (Signed)
Last check of H/H showed normalization of blood counts.  Monitor for any clinical change.

## 2015-04-21 NOTE — Assessment & Plan Note (Signed)
BP Readings from Last 3 Encounters:  04/21/15 124/71  12/22/14 118/64  10/07/14 117/70    Lab Results  Component Value Date   NA 138 12/22/2014   K 4.0 12/22/2014   CREATININE 0.93 12/22/2014    Assessment: Blood pressure control: controlled Progress toward BP goal:  at goal Comments: Doing very well, no issues, no headache, change in vision.   Plan: Medications:  continue current medications, metoprolol Educational resources provided:   Self management tools provided:   Other plans: Check renal function at next visit.

## 2015-04-21 NOTE — Assessment & Plan Note (Signed)
Spleen noted as "unremarkable" on CT scan this year.  Caretaker reports that this issue is from "20 years ago."  Will remove from problem list.

## 2015-04-21 NOTE — Assessment & Plan Note (Signed)
Doing very well post surgery.  No complaints today.

## 2015-04-21 NOTE — Patient Instructions (Signed)
General Instructions: Please schedule a follow up visit within the next 6 months.   For your medications:   Please bring all of your pill  Bottles with you to each visit.  This will help make sure that we have an up to date list of all the medications you are taking.  Please also bring any over the counter herbal medications you are taking (not including advil, tylenol, etc.)  Please continue taking all of your medications as prescribed  Please call the clinic if you have any questions.   Thank you!   Treatment Goals:  Goals (1 Years of Data) as of 04/21/15    None      Progress Toward Treatment Goals:  Treatment Goal 04/21/2015  Blood pressure at goal    Self Care Goals & Plans:  Self Care Goal 04/21/2015  Manage my medications take my medicines as prescribed; bring my medications to every visit; refill my medications on time  Monitor my health -  Eat healthy foods -  Be physically active -    No flowsheet data found.   Care Management & Community Referrals:  No flowsheet data found.

## 2015-05-13 ENCOUNTER — Telehealth: Payer: Self-pay | Admitting: Internal Medicine

## 2015-05-13 NOTE — Telephone Encounter (Signed)
Per Sophia there is not HIPPA form on file. Called and spoke with Joaquim Lai and advised her we would need a signed HIPPA on file.

## 2015-07-09 ENCOUNTER — Ambulatory Visit (INDEPENDENT_AMBULATORY_CARE_PROVIDER_SITE_OTHER): Payer: Medicare Other | Admitting: Gastroenterology

## 2015-07-09 ENCOUNTER — Encounter: Payer: Self-pay | Admitting: Gastroenterology

## 2015-07-09 VITALS — BP 90/60 | HR 100 | Ht 74.0 in | Wt 179.0 lb

## 2015-07-09 DIAGNOSIS — K5900 Constipation, unspecified: Secondary | ICD-10-CM | POA: Diagnosis not present

## 2015-07-09 DIAGNOSIS — K599 Functional intestinal disorder, unspecified: Secondary | ICD-10-CM

## 2015-07-09 DIAGNOSIS — Z9049 Acquired absence of other specified parts of digestive tract: Secondary | ICD-10-CM

## 2015-07-09 MED ORDER — ENSURE COMPLETE SHAKE PO LIQD: 1.0000 | Freq: Two times a day (BID) | ORAL | Status: DC

## 2015-07-09 MED ORDER — POLYETHYLENE GLYCOL 3350 17 G PO PACK: 8.5000 g | PACK | Freq: Two times a day (BID) | ORAL | Status: DC

## 2015-07-09 NOTE — Patient Instructions (Signed)
We refilled your Mirialax today Follow up as needed

## 2015-07-09 NOTE — Progress Notes (Signed)
[name] Branch    [id number]    August 22, 1965  Primary Care Physician:[name], [name], MD  Referring Physician: [name], MD [name], [name] [id number]  Chief complaint:  Chronic constipation  HPI:  49 year old African-American male with history of schizophrenia, mental retardation, chronic colonic inertia status post total colectomy with ileorectal anastomosis in February 2016 ear for follow-up visit, accompanied by his caregiver. He is doing well since his surgery, having regular bowel movements, continues to use daily MiraLAX. His eating well and weight is stable. He continues to supplement his diet with ensure. Denies any nausea, vomiting, abdominal pain, melena or bright red blood per rectum   Outpatient Encounter Prescriptions as of 07/09/2015  Medication Sig  . cloZAPine (CLOZARIL) 100 MG tablet Take 100 mg by mouth 2 (two) times daily.   . cloZAPine (CLOZARIL) 25 MG tablet Take 25 mg by mouth 2 (two) times daily.  . fluvoxaMINE (LUVOX) 100 MG tablet Take 300 mg by mouth at bedtime.  . Multiple Vitamin (MULTIVITAMIN) capsule Take 1 capsule by mouth daily.  . Nutritional Supplements (ENSURE COMPLETE SHAKE) LIQD Take 1 Can by mouth 2 (two) times daily.  . polyethylene glycol (MIRALAX / GLYCOLAX) packet Take 8.5 g by mouth 2 (two) times daily.  . pravastatin (PRAVACHOL) 40 MG tablet TAKE ONE TABLET EACH DAY  . travoprost, benzalkonium, (TRAVATAN) 0.004 % ophthalmic solution Place 1 drop into both eyes at bedtime.   . [DISCONTINUED] metoprolol succinate (TOPROL-XL) 25 MG 24 hr tablet Take 0.5 tablets (12.5 mg total) by mouth daily.   No facility-administered encounter medications on file as of 07/09/2015.    Allergies as of 07/09/2015  . (No Known Allergies)    Past Medical History  Diagnosis Date  . Colonic inertia   . Hyperlipidemia   . Constipation   . Sarcoidosis ([place])   . 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 Schizophrenia ([place])   . Mental  retardation   . Cardiomyopathy   . Glaucoma   . Obstruction of colon ([facility])     recurrent  . ABSCESS, ANAL/RECTAL REGIONS 08/18/2006    Annotation: Fournier gangrene Qualifier: History of  By: [name] Scheuermann MD, [name] Branch    . 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    Annotation: chronic with stercoral ulcer  Qualifier: Diagnosis of  By: [name] Scheuermann MD, [name] Branch    . Hypovolemic shock [facility])     Past Surgical History  Procedure Laterality Date  . Groin surgery      boil drained  . Colectomy N/A 09/25/2014    Procedure: OPEN ABDOMINAL COLECTOMY;  Surgeon: [name] Boston, MD;  Location: [facility];  Service: General;  Laterality: N/A;  . Proctoscopy N/A 09/25/2014    Procedure: RIGID PROCTOSCOPY;  Surgeon: [name] Boston, MD;  Location: [facility];  Service: General;  Laterality: N/A;    Family History  Problem Relation Age of Onset  . Diabetes Mother   . Colon cancer Neg Hx     Social History   Social History  . Marital Status: Single    Spouse Name: N/A  . Number of Children: 0  . Years of Education: N/A   Occupational History  .     Social History Main Topics  . Smoking status: Never Smoker   . Smokeless tobacco: Never Used  . Alcohol Use: No  . Drug Use: No  . Sexual Activity: Not on file     Comment:     Other  Topics Concern  . Not on file   Social History Narrative   Has a caretaker      Review of systems: Review of Systems  Constitutional: Negative for fever and chills.  HENT: Negative.   Eyes: Negative for blurred vision.  Respiratory: Negative for cough, shortness of breath and wheezing.   Cardiovascular: Negative for chest pain and palpitations.  Gastrointestinal: as per HPI Genitourinary: Negative for dysuria, urgency, frequency and hematuria.  Musculoskeletal: Negative for myalgias, back pain and joint pain.  Skin: Negative for itching and rash.  Neurological: Negative for dizziness, tremors, focal weakness, seizures and loss of consciousness.  Endo/Heme/Allergies: Negative for  environmental allergies.  Psychiatric/Behavioral: Negative for depression, suicidal ideas and hallucinations.  All other systems reviewed and are negative.   Physical Exam: Filed Vitals:   07/09/15 0930  BP: 90/60  Pulse: 100   Gen:      No acute distress HEENT:  EOMI, sclera anicteric Neck:     No masses; no thyromegaly Lungs:    Clear to auscultation bilaterally; normal respiratory effort CV:         Regular rate and rhythm; no murmurs Abd:      + bowel sounds; soft, non-tender; no palpable masses, no distension Ext:    No edema; adequate peripheral perfusion Skin:      Warm and dry; no rash Neuro: alert and oriented x 3 Psych: normal mood and affect  Data Reviewed:  Reviewed chart and operative report for total colectomy   Assessment and Plan/Recommendations:  49 year old African-American male with history of schizophrenia, mental retardation, chronic colonic inertia status post total colectomy with ileorectal anastomosis in February 2016 ear for follow-up visit Continue MiraLAX as needed We'll refill ensure  Return as needed  [name] Branch , MD [phone] Mon-Fri 8a-5p [phone] after 5p, weekends, holidays

## 2015-08-02 ENCOUNTER — Emergency Department (INDEPENDENT_AMBULATORY_CARE_PROVIDER_SITE_OTHER)
Admission: EM | Admit: 2015-08-02 | Discharge: 2015-08-02 | Disposition: A | Discharge status: Home / Self Care | Payer: Medicare Other | Source: Home / Self Care | Attending: Family Medicine | Admitting: Family Medicine

## 2015-08-02 ENCOUNTER — Encounter (HOSPITAL_COMMUNITY): Payer: Self-pay | Admitting: *Deleted

## 2015-08-02 DIAGNOSIS — K047 Periapical abscess without sinus: Secondary | ICD-10-CM | POA: Diagnosis not present

## 2015-08-02 MED ORDER — CLINDAMYCIN HCL 300 MG PO CAPS: 300.0000 mg | ORAL_CAPSULE | Freq: Three times a day (TID) | ORAL | Status: DC

## 2015-08-02 NOTE — ED Notes (Signed)
PT  HAS  PAIN  AND   SWELLING  TO  L  SIDE  OF  HIS  FACE         ONSET  OF SYMPTOMS   X   1    DAY

## 2015-08-02 NOTE — Discharge Instructions (Signed)
Take medicine as prescribed, see your dentist as soon as possible °

## 2015-08-02 NOTE — ED Provider Notes (Signed)
CSN: [id number]     Arrival date & time 08/02/15  1308 History   First MD Initiated Contact with Patient 08/02/15 1418     Chief Complaint  Patient presents with  . Dental Problem   (Consider location/radiation/quality/duration/timing/severity/associated sxs/prior Treatment) Patient is a 50 y.o. male presenting with tooth pain. The history is provided by the patient and a caregiver.  Dental Pain Location:  Upper Upper teeth location:  11/LU cuspid Quality:  Throbbing Severity:  Moderate Onset quality:  Gradual Duration:  1 day Progression:  Worsening Chronicity:  New Context: abscess, dental caries and poor dentition   Relieved by:  None tried Worsened by:  Nothing tried Ineffective treatments:  None tried Associated symptoms: facial swelling and gum swelling   Associated symptoms: no fever   Risk factors: lack of dental care     Past Medical History  Diagnosis Date  . Colonic inertia   . Hyperlipidemia   . Constipation   . Sarcoidosis ([place])   . 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 Schizophrenia ([place])   . Mental retardation   . Cardiomyopathy   . Glaucoma   . Obstruction of colon (HCC)     recurrent  . ABSCESS, ANAL/RECTAL REGIONS 08/18/2006    Annotation: Fournier gangrene Qualifier: History of  By: Garnette Scheuermann MD, Arlee Muslim    . 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    Annotation: chronic with stercoral ulcer  Qualifier: Diagnosis of  By: Garnette Scheuermann MD, Arlee Muslim    . Hypovolemic shock [facility])    Past Surgical History  Procedure Laterality Date  . Groin surgery      boil drained  . Colectomy N/A 09/25/2014    Procedure: OPEN ABDOMINAL COLECTOMY;  Surgeon: Michael Boston, MD;  Location[facility];  Service: General;  Laterality: N/A;  . Proctoscopy N/A 09/25/2014    Procedure: RIGID PROCTOSCOPY;  Surgeon: Michael Boston, MD;  Location: [facility];  Service: General;  Laterality: N/A;   Family History  Problem Relation Age of Onset  . Diabetes Mother   . Colon cancer Neg Hx    Social History    Substance Use Topics  . Smoking status: Never Smoker   . Smokeless tobacco: Never Used  . Alcohol Use: No    Review of Systems  Constitutional: Negative for fever.  HENT: Positive for dental problem and facial swelling.   All other systems reviewed and are negative.   Allergies  Review of patient's allergies indicates no known allergies.  Home Medications   Prior to Admission medications   Medication Sig Start Date End Date Taking? Authorizing Provider  clindamycin (CLEOCIN) 300 MG capsule Take 1 capsule (300 mg total) by mouth 3 (three) times daily. 08/02/15   Billy Fischer, MD  cloZAPine (CLOZARIL) 100 MG tablet Take 100 mg by mouth 2 (two) times daily.     Historical Provider, MD  cloZAPine (CLOZARIL) 25 MG tablet Take 25 mg by mouth 2 (two) times daily.    Historical Provider, MD  fluvoxaMINE (LUVOX) 100 MG tablet Take 300 mg by mouth at bedtime.    Historical Provider, MD  Multiple Vitamin (MULTIVITAMIN) capsule Take 1 capsule by mouth daily. 04/21/15   [name], MD  Nutritional Supplements (ENSURE COMPLETE SHAKE) LIQD Take 1 Can by mouth 2 (two) times daily. 07/09/15   Mauri Pole, MD  polyethylene glycol (MIRALAX / GLYCOLAX) packet Take 8.5 g by mouth 2 (two) times daily. 07/09/15   Mauri Pole, MD  polyethylene glycol (MIRALAX / GLYCOLAX)  packet Take 8.5 g by mouth 2 (two) times daily. 07/09/15   Mauri Pole, MD  polyethylene glycol (MIRALAX / GLYCOLAX) packet Take 8.5 g by mouth 2 (two) times daily. 07/09/15   Mauri Pole, MD  pravastatin (PRAVACHOL) 40 MG tablet TAKE ONE TABLET EACH DAY 04/21/15   [name], MD  travoprost, benzalkonium, (TRAVATAN) 0.004 % ophthalmic solution Place 1 drop into both eyes at bedtime.     Historical Provider, MD   Meds Ordered and Administered this Visit  Medications - No data to display  There were no vitals taken for this visit. No data found.   Physical Exam  Constitutional: He is oriented to person,  place, and time. He appears well-developed and well-nourished.  HENT:  Right Ear: External ear normal.  Left Ear: External ear normal.  Mouth/Throat: Uvula is midline and mucous membranes are normal. Dental abscesses present.    Eyes: Pupils are equal, round, and reactive to light.  Neck: Normal range of motion. Neck supple.  Lymphadenopathy:    He has no cervical adenopathy.  Neurological: He is alert and oriented to person, place, and time.  Skin: Skin is warm and dry.  Nursing note and vitals reviewed.   ED Course  Procedures (including critical care time)  Labs Review Labs Reviewed - No data to display  Imaging Review No results found.   Visual Acuity Review  Right Eye Distance:   Left Eye Distance:   Bilateral Distance:    Right Eye Near:   Left Eye Near:    Bilateral Near:         MDM   1. Abscess, dental        Billy Fischer, MD 08/02/15 1434

## 2015-09-29 ENCOUNTER — Encounter: Payer: Self-pay | Admitting: Internal Medicine

## 2015-09-29 ENCOUNTER — Ambulatory Visit (INDEPENDENT_AMBULATORY_CARE_PROVIDER_SITE_OTHER): Payer: Medicare Other | Admitting: Internal Medicine

## 2015-09-29 VITALS — BP 117/61 | HR 96 | Temp 98.0°F | Ht 74.0 in | Wt 180.3 lb

## 2015-09-29 DIAGNOSIS — E785 Hyperlipidemia, unspecified: Secondary | ICD-10-CM | POA: Diagnosis not present

## 2015-09-29 DIAGNOSIS — Z79899 Other long term (current) drug therapy: Secondary | ICD-10-CM | POA: Diagnosis not present

## 2015-09-29 DIAGNOSIS — H409 Unspecified glaucoma: Secondary | ICD-10-CM

## 2015-09-29 DIAGNOSIS — K3189 Other diseases of stomach and duodenum: Secondary | ICD-10-CM | POA: Diagnosis not present

## 2015-09-29 DIAGNOSIS — I110 Hypertensive heart disease with heart failure: Secondary | ICD-10-CM

## 2015-09-29 DIAGNOSIS — G4733 Obstructive sleep apnea (adult) (pediatric): Secondary | ICD-10-CM | POA: Diagnosis not present

## 2015-09-29 DIAGNOSIS — Z9049 Acquired absence of other specified parts of digestive tract: Secondary | ICD-10-CM | POA: Diagnosis not present

## 2015-09-29 DIAGNOSIS — I5022 Chronic systolic (congestive) heart failure: Secondary | ICD-10-CM | POA: Diagnosis not present

## 2015-09-29 DIAGNOSIS — K599 Functional intestinal disorder, unspecified: Secondary | ICD-10-CM

## 2015-09-29 DIAGNOSIS — I10 Essential (primary) hypertension: Secondary | ICD-10-CM

## 2015-09-29 DIAGNOSIS — F209 Schizophrenia, unspecified: Secondary | ICD-10-CM

## 2015-09-29 MED ORDER — MULTIVITAMINS PO CAPS: 1.0000 | ORAL_CAPSULE | Freq: Every day | ORAL | Status: DC

## 2015-09-29 MED ORDER — PRAVASTATIN SODIUM 40 MG PO TABS: ORAL_TABLET | ORAL | Status: DC

## 2015-09-29 NOTE — Patient Instructions (Signed)
Mr. Thakore,   It was a pleasure to see you today.  I am very glad that your abdominal issues are so much better!  Keep taking the same medications and make an appointment to come back in about 1 year.   Thank you!  Pravastatin tablets What is this medicine? PRAVASTATIN (PRA va stat in) is known as a HMG-CoA reductase inhibitor or 'statin'. It lowers the level of cholesterol and triglycerides in the blood. This drug may also reduce the risk of heart attack, stroke, or other health problems in patients with risk factors for heart disease. Diet and lifestyle changes are often used with this drug. This medicine may be used for other purposes; ask your health care provider or pharmacist if you have questions. What should I tell my health care provider before I take this medicine? They need to know if you have any of these conditions: -frequently drink alcoholic beverages -kidney disease -liver disease -muscle aches or weakness -other medical condition -an unusual or allergic reaction to pravastatin, other medicines, foods, dyes, or preservatives -pregnant or trying to get pregnant -breast-feeding How should I use this medicine? Take pravastatin tablets by mouth. Swallow the tablets with a drink of water. Pravastatin can be taken at anytime of the day, with or without food. Follow the directions on the prescription label. Take your doses at regular intervals. Do not take your medicine more often than directed. Talk to your pediatrician regarding the use of this medicine in children. Special care may be needed. Pravastatin has been used in children as young as 22 years of age. Overdosage: If you think you have taken too much of this medicine contact a poison control center or emergency room at once. NOTE: This medicine is only for you. Do not share this medicine with others. What if I miss a dose? If you miss a dose, take it as soon as you can. If it is almost time for your next dose, take  only that dose. Do not take double or extra doses. What may interact with this medicine? Do not take this medicine with any of the following medications: -herbal medicines such as red yeast rice This medicine may also interact with the following medications: -alcohol -antiviral medicines for HIV or AIDS -certain medicines for fungal infections like ketoconazole and itraconazole -colchicine -cyclosporine -other medicines for high cholesterol -some antibiotics like clarithromycin, erythromycin, and telithromycin This list may not describe all possible interactions. Give your health care provider a list of all the medicines, herbs, non-prescription drugs, or dietary supplements you use. Also tell them if you smoke, drink alcohol, or use illegal drugs. Some items may interact with your medicine. What should I watch for while using this medicine? Visit your doctor or health care professional for regular check-ups. You may need regular tests to make sure your liver is working properly. Tell your doctor or health care professional right away if you get any unexplained muscle pain, tenderness, or weakness, especially if you also have a fever and tiredness. Your doctor or health care professional may tell you to stop taking this medicine if you develop muscle problems. If your muscle problems do not go away after stopping this medicine, contact your health care professional. This drug is only part of a total heart-health program. Your doctor or a dietician can suggest a low-cholesterol and low-fat diet to help. Avoid alcohol and smoking, and keep a proper exercise schedule. Do not use this drug if you are pregnant or breast-feeding. Serious side  effects to an unborn child or to an infant are possible. Talk to your doctor or pharmacist for more information. This medicine may affect blood sugar levels. If you have diabetes, check with your doctor or health care professional before you change your diet or the  dose of your diabetic medicine. If you are going to have surgery tell your health care professional that you are taking this drug. What side effects may I notice from receiving this medicine? Side effects that you should report to your doctor or health care professional as soon as possible: -allergic reactions like skin rash, itching or hives, swelling of the face, lips, or tongue -dark urine -fever -muscle pain, cramps, or weakness -redness, blistering, peeling or loosening of the skin, including inside the mouth -trouble passing urine or change in the amount of urine -unusually weak or tired -yellowing of the eyes or skin Side effects that usually do not require medical attention (report to your doctor or health care professional if they continue or are bothersome): -gas -headache -heartburn -indigestion -stomach pain This list may not describe all possible side effects. Call your doctor for medical advice about side effects. You may report side effects to FDA at 1-800-FDA-1088. Where should I keep my medicine? Keep out of the reach of children. Store at room temperature between 15 to 30 degrees C (59 to 86 degrees F). Protect from light. Keep container tightly closed. Throw away any unused medicine after the expiration date. NOTE: This sheet is a summary. It may not cover all possible information. If you have questions about this medicine, talk to your doctor, pharmacist, or health care provider.    2016, Elsevier/Gold Standard. (2011-06-06 10:39:37)

## 2015-09-29 NOTE — Progress Notes (Signed)
   Subjective:    Patient ID: [name] Branch, male    DOB: [birth year], 50 y.o.   MRN: [id number]  CC: 6 month follow up for HLD  HPI  [name] Branch is a 50yo man with PMH of MR, HLD, colonic inertia s/p colectomy who presents for routine follow up.   [name] Branch presents with his caretaker.  He has MR and some mental health issues and is reticent to speak usually.  His caretaker is with him during the day and knows his medical history.   No acute complaints.  Reports he is "like a new man" since his colon surgery.  He has no issues with his surgical scar and is much more comfortable.   He has a history of HTN and OSA, but caretaker reports that a few years ago he weight upwards of 350 pounds and since losing weight he has not needed BP medications or CPAP.  He has a history of CHF on his record, but reports no symptoms of SOB, swelling, PND or orthopnea today.  He does not follow with a cardiologist.    He is taking his medications without issue.  He does not smoke.   Review of Systems  Constitutional: Negative for activity change and fatigue.  Respiratory: Negative for cough and shortness of breath.   Cardiovascular: Negative for chest pain and leg swelling.  Gastrointestinal: Negative for abdominal pain and abdominal distention.  Musculoskeletal: Negative for back pain and arthralgias.  Neurological: Negative for dizziness and light-headedness.       Objective:   Physical Exam  Constitutional: He is oriented to person, place, and time.  Thin man, not talkative  HENT:  Head: Normocephalic and atraumatic.  Eyes: No scleral icterus.  Injected sclerae at baseline, somewhat proptotic eyes, has remained stable for many years.   Cardiovascular: Normal rate, regular rhythm and normal heart sounds.   No murmur heard. Pulmonary/Chest: Effort normal and breath sounds normal. No respiratory distress. He has no wheezes.  Abdominal: Soft. Bowel sounds are normal.  Musculoskeletal: He  exhibits no edema or tenderness.  Neurological: He is alert and oriented to person, place, and time.  Psychiatric: He has a normal mood and affect. His behavior is normal.   No labs today     Assessment & Plan:  RTC in 1 year

## 2015-09-29 NOTE — Assessment & Plan Note (Signed)
No longer requires CPAP since weight loss.

## 2015-09-29 NOTE — Assessment & Plan Note (Signed)
Follows with Dr. Charlane Ferretti in mental health.  No issues and no recent change in medications.  He is able to hold down a job and seems to be well adjusted.   Continue clozapine, fluvoxamine.

## 2015-09-29 NOTE — Assessment & Plan Note (Signed)
Patient and caretaker unaware of diagnosis, but appears to be related to TTE from 2016.  They are uninterested in repeat study.   Plan:  Risk factor modification BP well controlled on no meds Continue statin Screen for DM at next blood draw (next year)

## 2015-09-29 NOTE — Assessment & Plan Note (Signed)
Appears to no longer be an issue since losing a significant amount of weight (this was both intentional and related to his previous GI issues).   Continue to monitor for recurrence of elevated BP.

## 2015-09-29 NOTE — Assessment & Plan Note (Signed)
At last check LDL was 130.  He does have a concomitant diagnosis of CHF and may do better on a high intensity statin at this time.  Caretaker does not want to "rock the boat" so will check lipid profile at next visit and consider changing if needed and/or patient is amenable.   Check lipid panel at next visit.   Continue pravastatin.

## 2015-09-29 NOTE — Assessment & Plan Note (Signed)
Follows closely with his eye doctor at [place] to take travaprost with good results.

## 2015-09-29 NOTE — Assessment & Plan Note (Signed)
Much improved.  Feeling much better since surgery.

## 2016-06-28 ENCOUNTER — Ambulatory Visit: Payer: Medicare Other | Admitting: Gastroenterology

## 2016-07-05 ENCOUNTER — Ambulatory Visit: Payer: Medicare Other | Admitting: Gastroenterology

## 2016-07-19 ENCOUNTER — Encounter: Payer: Self-pay | Admitting: Gastroenterology

## 2016-07-19 ENCOUNTER — Ambulatory Visit (INDEPENDENT_AMBULATORY_CARE_PROVIDER_SITE_OTHER): Payer: Medicare Other | Admitting: Gastroenterology

## 2016-07-19 VITALS — BP 120/74 | HR 76 | Ht 74.0 in | Wt 188.0 lb

## 2016-07-19 DIAGNOSIS — Z23 Encounter for immunization: Secondary | ICD-10-CM

## 2016-07-19 DIAGNOSIS — K5909 Other constipation: Secondary | ICD-10-CM

## 2016-07-19 MED ORDER — POLYETHYLENE GLYCOL 3350 17 GM/SCOOP PO POWD: 8.5000 g | Freq: Two times a day (BID) | ORAL | 3 refills | Status: DC

## 2016-07-19 NOTE — Progress Notes (Signed)
     07/19/2016 [name] [id number] May 26, 1966  CC:  Chronic constipation  History of Present Illness:  This is a 50 year old African-American male with history of schizophrenia, mental retardation, chronic colonic inertia status post total colectomy with ileorectal anastomosis in February 2016.  Here for yearly follow-up visit, accompanied by his caregiver. He is doing well since his surgery, having regular bowel movements, sometimes twice a day.  Continues to use Miralax 8.5 grams BID. His eating well and is gaining weight.  Caregiver asking to discontinue Ensure.  Denies any nausea, vomiting, abdominal pain, melena or bright red blood per rectum.  Would like a flu shot today as well.   Current Medications, Allergies, Past Medical History, Past Surgical History, Family History and Social History were reviewed in [employer] record.   Physical Exam: BP 120/74   Pulse 76   Ht 6\' 2"  (1.88 m)   Wt 188 lb (85.3 kg)   BMI 24.14 kg/m  General: Well developed black male in no acute distress Head: Normocephalic and atraumatic Eyes:  Sclerae anicteric, conjunctiva pink  Ears: Normal auditory acuity Lungs: Clear throughout to auscultation Heart: Regular rate and rhythm Abdomen: Soft, non-distended.  Normal bowel sounds.  Non-tender. Musculoskeletal: Symmetrical with no gross deformities  Extremities: No edema  Neurological: Alert oriented x 4, grossly non-focal Psychological:  Alert and cooperative. Normal mood and affect  Assessment and Recommendations: -50 year-old African-American male with history of schizophrenia, mental retardation, chronic colonic inertia status post total colectomy with ileorectal anastomosis in February 2016 here for yearly follow-up.  Doing well on Miralax 8.5 grams BID (will give prescription).  Can discontinue Ensure.  Give flu shot today.  Follow-up in one year or prn.

## 2016-07-19 NOTE — Progress Notes (Signed)
Reviewed and agree with documentation and assessment and plan. K. Veena Nandigam , MD   

## 2016-07-19 NOTE — Patient Instructions (Signed)
  Please discontinue your Ensure.  We are giving you a printed rx for Miralax to take and fill.  Today you have been given a flu shot .  I appreciate the opportunity to care for you.

## 2016-09-29 ENCOUNTER — Other Ambulatory Visit: Payer: Self-pay | Admitting: *Deleted

## 2016-09-29 NOTE — Telephone Encounter (Signed)
Last appt was 09/2015. I called caregiver's # to schedule an appt- no answer, no voicemail - unable to leave message. I will send message to front office to call later.

## 2016-10-03 MED ORDER — PRAVASTATIN SODIUM 40 MG PO TABS: ORAL_TABLET | ORAL | 3 refills | Status: DC

## 2016-10-04 NOTE — Telephone Encounter (Signed)
Per [facility] , pt has scheduled an appt w/Dr Daryll Drown in April.

## 2016-10-12 ENCOUNTER — Ambulatory Visit (INDEPENDENT_AMBULATORY_CARE_PROVIDER_SITE_OTHER): Payer: Medicare Other | Admitting: Internal Medicine

## 2016-10-12 ENCOUNTER — Encounter: Payer: Self-pay | Admitting: Internal Medicine

## 2016-10-12 VITALS — BP 125/70 | HR 74 | Temp 97.7°F | Ht 74.0 in | Wt 184.9 lb

## 2016-10-12 DIAGNOSIS — E785 Hyperlipidemia, unspecified: Secondary | ICD-10-CM

## 2016-10-12 DIAGNOSIS — R42 Dizziness and giddiness: Secondary | ICD-10-CM | POA: Diagnosis present

## 2016-10-12 MED ORDER — PRAVASTATIN SODIUM 40 MG PO TABS: ORAL_TABLET | ORAL | 3 refills | Status: DC

## 2016-10-12 MED ORDER — MULTIVITAMINS PO CAPS: 1.0000 | ORAL_CAPSULE | Freq: Every day | ORAL | 11 refills | Status: DC

## 2016-10-12 NOTE — Progress Notes (Signed)
   CC: dizziness  HPI:  [name] is a 52 y.o. with PMH as listed below is here for after feeling dizzy at work earlier today.  At around 11 am today at his work place he stood up and started feeling light headed and felt like he was pass out. He grabbed on the wall and with help, he sat down, and felt better shortly. No chest pain, palpitation, LOC, vision changes, numbness, weakness, tingling. No n/v, diarrhea. Has been feeling well, no illness recently. Has had good oral intake. This is the first time he felt dizzy.   Feeling fine right now. No complaints. Care giver mentioned that sometimes he acts out to get other's attention.   Past Medical History:  Diagnosis Date  . ABSCESS, ANAL/RECTAL REGIONS 08/18/2006   Annotation: Fournier gangrene Qualifier: History of  By: Garnette Scheuermann MD, Arlee Muslim    . Cardiomyopathy   . Colonic inertia   . Constipation   . Glaucoma   . Hyperlipidemia   . Hypovolemic shock ([place])   . Mental retardation   . 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   Annotation: chronic with stercoral ulcer  Qualifier: Diagnosis of  By: Garnette Scheuermann MD, Arlee Muslim    . Obstruction of colon    recurrent  . Sarcoidosis ([place])   . Schizophrenia ([place])   . 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    Review of Systems:   Review of Systems  Constitutional: Negative for chills and fever.  Eyes: Negative for blurred vision and double vision.  Cardiovascular: Negative for chest pain, palpitations and leg swelling.  Gastrointestinal: Negative for heartburn, nausea and vomiting.  Neurological: Negative for dizziness, tingling, sensory change and headaches.     Physical Exam:  Vitals:   10/12/16 1610  BP: 125/70  Pulse: 74  Temp: 97.7 F (36.5 C)  TempSrc: Oral  SpO2: 100%  Weight: 184 lb 14.4 oz (83.9 kg)  Height: 6\' 2"  (1.88 m)  orthostatic vitals were negative.  Physical Exam  Constitutional: He is oriented to person, place, and time. He appears well-developed and well-nourished. No distress.    HENT:  Head: Normocephalic and atraumatic.  Mouth/Throat: No oropharyngeal exudate.  Neck: Normal range of motion. Neck supple.  Cardiovascular: Normal rate and regular rhythm.  Exam reveals no gallop and no friction rub.   No murmur heard. Respiratory: Effort normal and breath sounds normal. No respiratory distress. He has no wheezes. He has no rales.  GI: Soft. Bowel sounds are normal.  Musculoskeletal: Normal range of motion. He exhibits no edema.  Neurological: He is alert and oriented to person, place, and time. No cranial nerve deficit.  Skin: Skin is warm. He is not diaphoretic.  Psychiatric: He has a normal mood and affect.    Assessment & Plan:   See Encounters Tab for problem based charting.  Patient discussed with Dr. Eppie Gibson

## 2016-10-12 NOTE — Assessment & Plan Note (Signed)
Unclear cause of dizziness which was brief. Hx not suggestive of arrhythmias or ACS, may have been related to getting overheated at the job place as care giver mentioned he wears several layers of clothing. No signs of dehydration on exam, orthostats negative. Heart exam is regular rate and rhythm. Vitals normal. He has no complaints currently.   - encouraged oral fluid intake. - if dizziness persists, may have to change his Luvox which can cause dizziness.

## 2016-10-12 NOTE — Progress Notes (Signed)
Case discussed with Dr. Genene Churn at the time of the visit. We reviewed the resident's history and exam and pertinent patient test results. I agree with the assessment, diagnosis, and plan of care documented in the resident's note.  If dizziness persists consider luvox as a potential cause.  Dizziness occurs in 11-15% of those taking this medication.  It is an SSRI and anxiety/depression are not on his problem list so I am assuming this is being used for his schizophrenia.

## 2016-10-12 NOTE — Assessment & Plan Note (Signed)
Requested paper prescription for statin and multivitamin which I did for them.

## 2016-10-12 NOTE — Patient Instructions (Signed)
I am glad to see you.  If you have dizziness again please come back and see [place].  Drink plenty of water and avoid getting over heated.

## 2016-11-21 ENCOUNTER — Telehealth: Payer: Self-pay | Admitting: Internal Medicine

## 2016-11-21 NOTE — Telephone Encounter (Signed)
APT. REMINDER CALL, NO ANSWER, NO VOICEMAIL °

## 2016-11-22 ENCOUNTER — Encounter: Payer: Medicare Other | Admitting: Internal Medicine

## 2016-12-06 ENCOUNTER — Ambulatory Visit (INDEPENDENT_AMBULATORY_CARE_PROVIDER_SITE_OTHER): Payer: Medicare Other | Admitting: Internal Medicine

## 2016-12-06 VITALS — BP 111/74 | HR 98 | Temp 97.6°F | Ht 74.0 in | Wt 183.5 lb

## 2016-12-06 DIAGNOSIS — E785 Hyperlipidemia, unspecified: Secondary | ICD-10-CM | POA: Diagnosis present

## 2016-12-06 DIAGNOSIS — Z8719 Personal history of other diseases of the digestive system: Secondary | ICD-10-CM | POA: Diagnosis not present

## 2016-12-06 DIAGNOSIS — D869 Sarcoidosis, unspecified: Secondary | ICD-10-CM

## 2016-12-06 DIAGNOSIS — I10 Essential (primary) hypertension: Secondary | ICD-10-CM

## 2016-12-06 DIAGNOSIS — R911 Solitary pulmonary nodule: Secondary | ICD-10-CM

## 2016-12-06 DIAGNOSIS — H409 Unspecified glaucoma: Secondary | ICD-10-CM | POA: Diagnosis not present

## 2016-12-06 DIAGNOSIS — I5022 Chronic systolic (congestive) heart failure: Secondary | ICD-10-CM | POA: Diagnosis not present

## 2016-12-06 DIAGNOSIS — Z8679 Personal history of other diseases of the circulatory system: Secondary | ICD-10-CM | POA: Diagnosis not present

## 2016-12-06 DIAGNOSIS — K599 Functional intestinal disorder, unspecified: Secondary | ICD-10-CM

## 2016-12-06 DIAGNOSIS — Z9049 Acquired absence of other specified parts of digestive tract: Secondary | ICD-10-CM

## 2016-12-06 DIAGNOSIS — F209 Schizophrenia, unspecified: Secondary | ICD-10-CM | POA: Diagnosis not present

## 2016-12-06 DIAGNOSIS — R42 Dizziness and giddiness: Secondary | ICD-10-CM

## 2016-12-06 NOTE — Assessment & Plan Note (Signed)
BP has been in the normal range for > 8 years.  Will resolve.

## 2016-12-06 NOTE — Assessment & Plan Note (Signed)
Continues to do well.  Possibly now has faster transit and loses more water in his stool which could contribute to his occasional dehydration.  Advised his caretaker continue to increase his water intake, with caution given his possible heart failure.

## 2016-12-06 NOTE — Patient Instructions (Addendum)
[name] - -  You are doing very well.  Please continue taking your medications as prescribed.  I will send you the results of your blood work when they are available.   Come back to see me in 1 year, sooner if needed.    Your chest scan did show a pulmonary  Nodule with recommendation to repeat a Chest CT.  More information is below for you to review and decide if you would be interested in getting this screening test.    Thank you!   Pulmonary Nodule A pulmonary nodule is a small, round spot in your lung. It is usually found when pictures of your lungs are taken for other reasons. Most pulmonary nodules are not cancerous and do not cause symptoms. Tests will be done to make sure the nodule is not cancerous. Pulmonary nodules that are not cancerous usually do not require treatment. Follow these instructions at home:  Only take medicine as told by your doctor.  Follow up with your doctor as told. Contact a doctor if:  You have trouble breathing when doing activities.  You feel sick or more tired than normal.  You do not feel like eating.  You lose weight without trying to.  You have chills.  You have night sweats. Get help right away if:  You cannot catch your breath.  You start making whistling sounds when breathing (wheezing).  You have a cough that does not go away.  You cough up blood.  You are dizzy or feel like you are going to pass out.  You have sudden chest pain.  You have a fever or lasting symptoms for more than 2-3 days.  You have a fever and your symptoms suddenly get worse. This information is not intended to replace advice given to you by your health care provider. Make sure you discuss any questions you have with your health care provider. Document Released: 08/19/2010 Document Revised: 12/23/2015 Document Reviewed: 01/06/2013 Elsevier Interactive Patient Education  2017 [employer].

## 2016-12-06 NOTE — Progress Notes (Signed)
   Subjective:    Patient ID: [name] Branch, male    DOB: [birth year], 51 y.o.   MRN: [id number]  1 year follow up for HLD  HPI  [name] Branch is a 51yo man with PMH of MR, HLD, colonic inertia s/p colectomy, schizophrenia who presents for follow up.   He was seen in this clinic about 1 month ago for a dizzy episode which improved with increased fluid intake.  He denies any further episodes.  He is taking his medications without issue and has no side effects from the medications.  His caretaker, who assists in history telling, reports that he has had issues like this before when he needed to have better intake of water.    He is following closely with his psychiatrist Dr. Charlane Ferretti and has had no recent changes in his medications.   I reviewed his recent optometry note and he is taking his eye drops as prescribed.    On the last CT scan of his chest, there was a noted pulmonary nodule.  I discussed the significance of this with his caretaker and the recommendation for a follow up scan.  She did not feel this was appropriate at this time as he is asymptomatic. I gave her information to read about what a pulmonary nodule is.  He is a lifelong non smoker and denies symptoms of cough, hemoptysis, weight loss, fever, night sweats.    Review of Systems  Constitutional: Negative for fatigue and fever.  Eyes: Negative for photophobia and visual disturbance.  Respiratory: Negative for cough and shortness of breath.   Cardiovascular: Negative for chest pain.  Gastrointestinal: Negative for abdominal distention and constipation.  Neurological: Negative for dizziness and weakness.       Objective:   Physical Exam  Constitutional: He is oriented to person, place, and time. He appears well-developed and well-nourished. No distress.  Thin, AA gentleman  HENT:  Head: Normocephalic and atraumatic.  Eyes: Right conjunctiva is injected. Right conjunctiva has no hemorrhage. Left conjunctiva is injected.  Left conjunctiva has no hemorrhage.  Cardiovascular: Normal rate and regular rhythm.   Murmur heard. Pulmonary/Chest: Effort normal and breath sounds normal. No respiratory distress. He has no wheezes.  Abdominal: Soft. Bowel sounds are normal.  Musculoskeletal: He exhibits no edema or tenderness.  Neurological: He is alert and oriented to person, place, and time.  Psychiatric: He has a normal mood and affect. His behavior is normal.    Lipid profile, BMET today.        Assessment & Plan:  RTC in 1 year.

## 2016-12-06 NOTE — Assessment & Plan Note (Signed)
No further episodes, he is drinking enough water now.  Caretaker and patient will monitor for any changes.

## 2016-12-06 NOTE — Assessment & Plan Note (Signed)
Following with eye center at [facility].  Continue eye drops. I reviewed the last note, his eye issues are stable. He reports no changes in vision at this time.

## 2016-12-06 NOTE — Assessment & Plan Note (Signed)
Caretaker states this was diagnosed > 25 years ago.  He has never had any issues stemming from it.  Review of last CT chest from 2016 showed no hilar LAD.  They are not sure how he was diagnosed.

## 2016-12-06 NOTE — Assessment & Plan Note (Signed)
No current changes, no swelling, sob, orthopnea.  Continue to monitor.  They have declined follow up TTE. Continue statin.

## 2016-12-06 NOTE — Assessment & Plan Note (Signed)
Check lipid panel today, continue pravastatin.

## 2016-12-06 NOTE — Assessment & Plan Note (Signed)
Well controlled, adapting well.  He is working without issues.  He follows with Dr. Charlane Ferretti.    Continue flovoxamine and cloxapine.

## 2016-12-07 LAB — BMP8+ANION GAP
ANION GAP: 14 mmol/L (ref 10.0–18.0)
BUN/Creatinine Ratio: 10 (ref 9–20)
BUN: 9 mg/dL (ref 6–24)
CHLORIDE: 103 mmol/L (ref 96–106)
CO2: 24 mmol/L (ref 18–29)
CREATININE: 0.94 mg/dL (ref 0.76–1.27)
Calcium: 8.8 mg/dL (ref 8.7–10.2)
GFR calc Af Amer: 109 mL/min/{1.73_m2} (ref >59)
GFR calc non Af Amer: 94 mL/min/{1.73_m2} (ref >59)
Glucose: 106 mg/dL — ABNORMAL HIGH (ref 65–99)
Potassium: 4.1 mmol/L (ref 3.5–5.2)
Sodium: 141 mmol/L (ref 134–144)

## 2016-12-07 LAB — LIPID PANEL
CHOLESTEROL TOTAL: 174 mg/dL (ref 100–199)
Chol/HDL Ratio: 3.1 ratio (ref 0.0–5.0)
HDL: 57 mg/dL (ref >39)
LDL CALC: 108 mg/dL — AB (ref 0–99)
TRIGLYCERIDES: 45 mg/dL (ref 0–149)
VLDL CHOLESTEROL CAL: 9 mg/dL (ref 5–40)

## 2016-12-12 ENCOUNTER — Encounter: Payer: Self-pay | Admitting: Internal Medicine

## 2017-05-15 ENCOUNTER — Ambulatory Visit: Payer: Medicare Other | Admitting: Gastroenterology

## 2017-05-21 ENCOUNTER — Ambulatory Visit: Payer: Medicare Other | Admitting: Gastroenterology

## 2017-07-09 ENCOUNTER — Ambulatory Visit: Payer: Medicare Other | Admitting: Gastroenterology

## 2017-07-20 ENCOUNTER — Ambulatory Visit (INDEPENDENT_AMBULATORY_CARE_PROVIDER_SITE_OTHER): Payer: Medicare Other | Admitting: Physician Assistant

## 2017-07-20 ENCOUNTER — Encounter: Payer: Self-pay | Admitting: Physician Assistant

## 2017-07-20 VITALS — BP 124/66 | HR 104 | Ht 74.0 in | Wt 188.0 lb

## 2017-07-20 DIAGNOSIS — K5909 Other constipation: Secondary | ICD-10-CM | POA: Diagnosis not present

## 2017-07-20 DIAGNOSIS — Z9049 Acquired absence of other specified parts of digestive tract: Secondary | ICD-10-CM

## 2017-07-20 MED ORDER — POLYETHYLENE GLYCOL 3350 17 GM/SCOOP PO POWD: ORAL | 11 refills | Status: DC

## 2017-07-20 NOTE — Progress Notes (Signed)
Subjective:    Patient ID: [name] Branch, male    DOB: 11/07/1965, 51 y.o.   MRN: [id number]  HPI [name] Branch is a 51 year old African-American male, currently established with Dr. Silverio Branch.  He has been known previously to Dr. Delfin Branch.  He comes in today for yearly follow-up.  Patient has history of mental retardation and schizophrenia, anemia of chronic disease, sarcoidosis, congestive heart failure and is status post total colectomy with ileorectal anastomosis in February 2016 which was done for chronic colonic inertia.  He is done very well since his surgery and has been maintained on MiraLAX. Patient does not offer any history.  His caregiver says he has been doing very well recently has no complaints of abdominal pain, he has very regular bowel movements with use of MiraLAX twice daily no diarrhea no melena or hematochezia.  His appetite has been good..  They have been obtaining MiraLAX as a prescription.  Review of Systems Pertinent positive and negative review of systems were noted in the above HPI section.  All other review of systems was otherwise negative.  Outpatient Encounter Medications as of 07/20/2017  Medication Sig  . cloZAPine (CLOZARIL) 100 MG tablet Take 100 mg by mouth 2 (two) times daily.   . cloZAPine (CLOZARIL) 25 MG tablet Take 25 mg by mouth 2 (two) times daily.  . fluvoxaMINE (LUVOX) 100 MG tablet Take 300 mg by mouth at bedtime.  . Multiple Vitamin (MULTIVITAMIN) capsule Take 1 capsule by mouth daily.  . polyethylene glycol powder (GLYCOLAX/MIRALAX) powder Take 17 grams in 8 oz of water twice daily.  . pravastatin (PRAVACHOL) 40 MG tablet TAKE ONE TABLET EACH DAY  . travoprost, benzalkonium, (TRAVATAN) 0.004 % ophthalmic solution Place 1 drop into both eyes at bedtime.   . [DISCONTINUED] Nutritional Supplements (ENSURE COMPLETE SHAKE) LIQD Take 1 Can by mouth 2 (two) times daily.  . [DISCONTINUED] polyethylene glycol (MIRALAX / GLYCOLAX) packet Take 8.5 g by mouth 2  (two) times daily.  . [DISCONTINUED] polyethylene glycol powder (GLYCOLAX/MIRALAX) powder Take 8.5 g by mouth 2 (two) times daily.   No facility-administered encounter medications on file as of 07/20/2017.    No Known Allergies Patient Active Problem List   Diagnosis Date Noted  . Dizziness 10/12/2016  . Anemia of chronic disease 10/05/2014  . Colonic inertia s/p abdominal colectomy 09/25/2014 09/25/2014  . Hyperlipidemia 09/17/2008  . Glaucoma 10/10/2006  . Sarcoidosis 06/11/2006  . Schizophrenia, unspecified type ([place]) 06/11/2006  . Mental retardation 06/11/2006  . Chronic systolic CHF (congestive heart failure) ([place]) 06/11/2006  . 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   Social History   Socioeconomic History  . Marital status: Single    Spouse name: Not on file  . Number of children: 0  . Years of education: Not on file  . Highest education level: Not on file  Social Needs  . Financial resource strain: Not on file  . Food insecurity - worry: Not on file  . Food insecurity - inability: Not on file  . Transportation needs - medical: Not on file  . Transportation needs - non-medical: Not on file  Occupational History    Employer: DISABLED  Tobacco Use  . Smoking status: Never Smoker  . Smokeless tobacco: Never Used  Substance and Sexual Activity  . Alcohol use: No    Alcohol/week: 0.0 oz  . Drug use: No  . Sexual activity: Not on file    Comment:    Other Topics Concern  . Not on file  Social History  Narrative   Has a caretaker    [name]'s family history includes Diabetes in his mother.      Objective:    Vitals:   07/20/17 0943  BP: 124/66  Pulse: (!) 104    Physical Exam ; well-developed African-American male in no acute distress, accompanied by his caregiver.  Blood pressure 124/66, pulse 104, BMI 24.1, height 6 foot 2, weight 188.  HEENT; nontraumatic normocephalic EOMI PERRLA sclera anicteric, Cardiovascular ;regular rate and rhythm with S1-S2 no murmur  rub or gallop, Pulmonary; clear bilaterally, Abdomen; soft, nontender nondistended bowel sounds are active there is no palpable mass or hepatosplenomegaly, Rectal ;exam not done, Ext; no clubbing cyanosis or edema skin warm and dry, Neuro psych; patient alert and cooperative.       Assessment & Plan:   #51 year old African-American male status post total colectomy with ileorectal anastomosis 2016 for chronic colonic inertia.  Patient comes in today for routine follow-up.  He continues to do very well with use of MiraLAX twice daily and has no current complaints. 2.  Schizophrenia 3.  Mental retardation 4.  Anemia of chronic disease 5.  History of sarcoidosis 6.  Congestive heart failure.  Plan; refill MiraLAX 17 g in 8 ounces water for twice daily usage. Patient will follow-up in 1 year or sooner if needed.  Amy S Esterwood PA-C 07/20/2017   Cc: [name] Falcon, MD

## 2017-07-20 NOTE — Patient Instructions (Signed)
We have printed for you the Miralax ( generic) prescription.

## 2017-07-20 NOTE — Progress Notes (Signed)
Reviewed and agree with documentation and assessment and plan. K. Veena Thresea Doble , MD   

## 2017-07-25 ENCOUNTER — Ambulatory Visit: Payer: Medicare Other | Admitting: Gastroenterology

## 2017-07-26 ENCOUNTER — Ambulatory Visit: Payer: Medicare Other | Attending: Internal Medicine | Admitting: Internal Medicine

## 2017-07-26 ENCOUNTER — Encounter: Payer: Self-pay | Admitting: Internal Medicine

## 2017-07-26 VITALS — BP 108/69 | HR 93 | Temp 98.1°F | Resp 16 | Ht 74.0 in | Wt 190.4 lb

## 2017-07-26 DIAGNOSIS — F209 Schizophrenia, unspecified: Secondary | ICD-10-CM | POA: Diagnosis not present

## 2017-07-26 DIAGNOSIS — E785 Hyperlipidemia, unspecified: Secondary | ICD-10-CM | POA: Diagnosis not present

## 2017-07-26 DIAGNOSIS — Z79899 Other long term (current) drug therapy: Secondary | ICD-10-CM | POA: Diagnosis not present

## 2017-07-26 DIAGNOSIS — I5022 Chronic systolic (congestive) heart failure: Secondary | ICD-10-CM

## 2017-07-26 DIAGNOSIS — H6121 Impacted cerumen, right ear: Secondary | ICD-10-CM | POA: Diagnosis not present

## 2017-07-26 DIAGNOSIS — F79 Unspecified intellectual disabilities: Secondary | ICD-10-CM | POA: Insufficient documentation

## 2017-07-26 DIAGNOSIS — Z9049 Acquired absence of other specified parts of digestive tract: Secondary | ICD-10-CM | POA: Insufficient documentation

## 2017-07-26 DIAGNOSIS — Z833 Family history of diabetes mellitus: Secondary | ICD-10-CM | POA: Diagnosis not present

## 2017-07-26 DIAGNOSIS — H409 Unspecified glaucoma: Secondary | ICD-10-CM

## 2017-07-26 DIAGNOSIS — D869 Sarcoidosis, unspecified: Secondary | ICD-10-CM | POA: Diagnosis not present

## 2017-07-26 DIAGNOSIS — Z23 Encounter for immunization: Secondary | ICD-10-CM

## 2017-07-26 MED ORDER — PRAVASTATIN SODIUM 40 MG PO TABS: ORAL_TABLET | ORAL | 3 refills | Status: DC

## 2017-07-26 NOTE — Progress Notes (Signed)
Patient ID: [name] Branch, male    DOB: 25-Dec-1965  MRN: [id number]  CC: New Patient (Initial Visit)   Subjective: [name] Branch is a 51 y.o. male who presents for new pt visit.  [name], his caregiver is with him and provides most of the history. His concerns today include:  51 year old male with history of mental retardation, sarcoidosis, schizophrenia, chronic systolic CHF, HL,  colonic inertia colectomy 2016 (ileorectal anastomosis) and glaucoma  Previous PCP was Dr. Gilles Chiquito with [facility]. They decided to change because of the convenience of getting in and out of our building compared to [place] is Dr. Charlane Ferretti at [place]. -Patient is on Clozaril and has blood count every mth  Dx with Sarcoidosis over 20 yrs ago.  Caregiver is not sure how it was dx.  Never had problems from it  Systolic CHF with EF 09-98% in 2016. No CP/SOB/LE edema.  Walks daily for 30 mins.  -Dub Mikes does all of the cooking and limits salt in foods  Had colectomy in 2016 for chronic severe constipation. Followed by [facility]. On Miralax. Has regular daily BM.  HL:  Tolerating HL  HM: flu shot done last mth through his psychiatrist.  Due for Tdap  Patient Active Problem List   Diagnosis Date Noted  . S/P total colectomy 07/20/2017  . Dizziness 10/12/2016  . Anemia of chronic disease 10/05/2014  . Colonic inertia s/p abdominal colectomy 09/25/2014 09/25/2014  . Hyperlipidemia 09/17/2008  . Glaucoma 10/10/2006  . Sarcoidosis 06/11/2006  . Schizophrenia, unspecified type ([place]) 06/11/2006  . Mental retardation 06/11/2006  . Chronic systolic CHF (congestive heart failure) ([place]) 06/11/2006  . 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     Current Outpatient Medications on File Prior to Visit  Medication Sig Dispense Refill  . cloZAPine (CLOZARIL) 100 MG tablet Take 100 mg by mouth 2 (two) times daily.     . cloZAPine (CLOZARIL) 25 MG tablet Take 25 mg by mouth 2  (two) times daily.    . fluvoxaMINE (LUVOX) 100 MG tablet Take 300 mg by mouth at bedtime.    . Multiple Vitamin (MULTIVITAMIN) capsule Take 1 capsule by mouth daily. 30 capsule 11  . polyethylene glycol powder (GLYCOLAX/MIRALAX) powder Take 17 grams in 8 oz of water twice daily. 255 g 11  . travoprost, benzalkonium, (TRAVATAN) 0.004 % ophthalmic solution Place 1 drop into both eyes at bedtime.      No current facility-administered medications on file prior to visit.     No Known Allergies  Social History   Socioeconomic History  . Marital status: Single    Spouse name: Not on file  . Number of children: 0  . Years of education: Not on file  . Highest education level: Not on file  Social Needs  . Financial resource strain: Not on file  . Food insecurity - worry: Not on file  . Food insecurity - inability: Not on file  . Transportation needs - medical: Not on file  . Transportation needs - non-medical: Not on file  Occupational History    Employer: DISABLED  Tobacco Use  . Smoking status: Never Smoker  . Smokeless tobacco: Never Used  Substance and Sexual Activity  . Alcohol use: No    Alcohol/week: 0.0 oz  . Drug use: No  . Sexual activity: Not on file    Comment:    Other Topics Concern  . Not on file  Social History Narrative   Has a  caretaker    Family History  Problem Relation Age of Onset  . Diabetes Mother   . Colon cancer Neg Hx     Past Surgical History:  Procedure Laterality Date  . COLECTOMY N/A 09/25/2014   Procedure: OPEN ABDOMINAL COLECTOMY;  Surgeon: Michael Boston, MD;  Location: [facility];  Service: General;  Laterality: N/A;  . groin surgery     boil drained  . PROCTOSCOPY N/A 09/25/2014   Procedure: RIGID PROCTOSCOPY;  Surgeon: Michael Boston, MD;  Location: [facility];  Service: General;  Laterality: N/A;    ROS: Review of Systems  Constitutional: Negative for activity change, appetite change, fever and unexpected weight change.  Eyes: Positive for  visual disturbance.  Respiratory: Negative for cough and chest tightness.   Cardiovascular: Negative for chest pain.  Genitourinary: Negative for difficulty urinating and hematuria.  Neurological: Negative for dizziness.    PHYSICAL EXAM: BP 108/69   Pulse 93   Temp 98.1 F (36.7 C) (Oral)   Resp 16   Ht 6\' 2"  (1.88 m)   Wt 190 lb 6.4 oz (86.4 kg)   SpO2 99%   BMI 24.45 kg/m   Wt Readings from Last 3 Encounters:  07/26/17 190 lb 6.4 oz (86.4 kg)  07/20/17 188 lb (85.3 kg)  12/06/16 183 lb 8 oz (83.2 kg)    Physical Exam General appearance - alert, well appearing, middle-aged male in NAD  Mental status -patient oriented to person and place  eyes - pupils equal and reactive, extraocular eye movements intact Ears - ceruminosis noted RT canal Nose - normal and patent, no erythema, discharge or polyps Mouth - mucous membranes moist, pharynx normal without lesions Neck - supple, no significant adenopathy Chest - clear to auscultation, no wheezes, rales or rhonchi, symmetric air entry Heart - normal rate, regular rhythm, normal S1, S2, no murmurs, rubs, clicks or gallops Abdomen - soft, nontender, nondistended, no masses or organomegaly GU Male - no penile lesions or discharge, no testicular masses or tenderness, no hernias Extremities - peripheral pulses normal, no pedal edema, no clubbing or cyanosis   ASSESSMENT AND PLAN: 1. Chronic systolic CHF (congestive heart failure) (HCC) Compensated. Not on any targeted meds at this time. BP control  2. Hyperlipidemia, unspecified hyperlipidemia type - pravastatin (PRAVACHOL) 40 MG tablet; TAKE ONE TABLET EACH DAY  Dispense: 90 tablet; Refill: 3  3. Schizophrenia, unspecified type ([place]) Followed by Dr. Charlane Ferretti. I have requested his caregiver to have Dr. Charlane Ferretti send me a copy of his next lab results for my record  4. Glaucoma, unspecified glaucoma type, unspecified laterality Followed by ophthalmology at [facility]  5.  Sarcoidosis No apparent pulmonary or skin issues at this time  6. Need for Tdap vaccination -to be given at pharmacy  Patient was given the opportunity to ask questions.  Patient verbalized understanding of the plan and was able to repeat key elements of the plan.   Orders Placed This Encounter  Procedures  . Tdap vaccine greater than or equal to 7yo IM     Requested Prescriptions   Signed Prescriptions Disp Refills  . pravastatin (PRAVACHOL) 40 MG tablet 90 tablet 3    Sig: TAKE ONE TABLET EACH DAY    Return in about 6 months (around 01/24/2018).  Karle Plumber, MD, FACP

## 2017-07-26 NOTE — Patient Instructions (Addendum)
Please follow up with [facility] to receive TDAP  Get some wax softener over the counter and use in right ear.   See a dentist about left upper 3rd molar.

## 2017-08-06 ENCOUNTER — Other Ambulatory Visit: Payer: Self-pay

## 2017-08-06 ENCOUNTER — Telehealth: Payer: Self-pay | Admitting: Internal Medicine

## 2017-08-06 DIAGNOSIS — E785 Hyperlipidemia, unspecified: Secondary | ICD-10-CM

## 2017-08-06 MED ORDER — MULTIVITAMINS PO CAPS: 1.0000 | ORAL_CAPSULE | Freq: Every day | ORAL | 3 refills | Status: DC

## 2017-08-06 MED ORDER — PRAVASTATIN SODIUM 40 MG PO TABS: ORAL_TABLET | ORAL | 3 refills | Status: DC

## 2017-08-06 NOTE — Telephone Encounter (Signed)
[name] Branch from [facility] requested a new prescription of pravastatin (PRAVACHOL) 40 MG tablet [[id number]]  Multiple Vitamin (MULTIVITAMIN) capsule [[id number]]  To be sent for refills. FAX# [phone] Phone# [phone]

## 2017-08-07 NOTE — Telephone Encounter (Signed)
Medications has been sent to the pharmacy

## 2017-11-09 ENCOUNTER — Other Ambulatory Visit: Payer: Self-pay | Admitting: Gastroenterology

## 2017-11-10 ENCOUNTER — Other Ambulatory Visit: Payer: Self-pay | Admitting: Internal Medicine

## 2017-11-23 ENCOUNTER — Ambulatory Visit: Payer: Medicare Other | Attending: Internal Medicine | Admitting: Internal Medicine

## 2017-11-23 ENCOUNTER — Encounter: Payer: Self-pay | Admitting: Internal Medicine

## 2017-11-23 VITALS — BP 118/76 | HR 104 | Temp 98.3°F | Resp 16 | Wt 198.0 lb

## 2017-11-23 DIAGNOSIS — Z79899 Other long term (current) drug therapy: Secondary | ICD-10-CM | POA: Diagnosis not present

## 2017-11-23 DIAGNOSIS — Z833 Family history of diabetes mellitus: Secondary | ICD-10-CM | POA: Diagnosis not present

## 2017-11-23 DIAGNOSIS — D869 Sarcoidosis, unspecified: Secondary | ICD-10-CM | POA: Diagnosis not present

## 2017-11-23 DIAGNOSIS — I5022 Chronic systolic (congestive) heart failure: Secondary | ICD-10-CM | POA: Diagnosis not present

## 2017-11-23 DIAGNOSIS — E785 Hyperlipidemia, unspecified: Secondary | ICD-10-CM

## 2017-11-23 DIAGNOSIS — R635 Abnormal weight gain: Secondary | ICD-10-CM

## 2017-11-23 DIAGNOSIS — Z9049 Acquired absence of other specified parts of digestive tract: Secondary | ICD-10-CM | POA: Insufficient documentation

## 2017-11-23 DIAGNOSIS — F209 Schizophrenia, unspecified: Secondary | ICD-10-CM | POA: Diagnosis not present

## 2017-11-23 DIAGNOSIS — Z6825 Body mass index (BMI) 25.0-25.9, adult: Secondary | ICD-10-CM | POA: Insufficient documentation

## 2017-11-23 DIAGNOSIS — Z23 Encounter for immunization: Secondary | ICD-10-CM | POA: Diagnosis not present

## 2017-11-23 DIAGNOSIS — F79 Unspecified intellectual disabilities: Secondary | ICD-10-CM | POA: Insufficient documentation

## 2017-11-23 MED ORDER — TETANUS-DIPHTH-ACELL PERTUSSIS 5-2.5-18.5 LF-MCG/0.5 IM SUSP: 0.5000 mL | Freq: Once | INTRAMUSCULAR | 0 refills | Status: AC

## 2017-11-23 MED FILL — BOOSTRIX VACCINE SYRINGE: 5-2.5-18.5 | 1 days supply | Qty: 1 | Fill #0

## 2017-11-23 NOTE — Progress Notes (Signed)
Pt is taking 125mg  combined

## 2017-11-23 NOTE — Patient Instructions (Addendum)
Try to cut back on eating junk snacks.  Try to eat more fruits.    Td Vaccine (Tetanus and Diphtheria): What You Need to Know 1. Why get vaccinated? Tetanus  and diphtheria are very serious diseases. They are rare in the [place] today, but people who do become infected often have severe complications. Td vaccine is used to protect adolescents and adults from both of these diseases. Both tetanus and diphtheria are infections caused by bacteria. Diphtheria spreads from person to person through coughing or sneezing. Tetanus-causing bacteria enter the body through cuts, scratches, or wounds. TETANUS (lockjaw) causes painful muscle tightening and stiffness, usually all over the body.  It can lead to tightening of muscles in the head and neck so you can't open your mouth, swallow, or sometimes even breathe. Tetanus kills about 1 out of every 10 people who are infected even after receiving the best medical care.  DIPHTHERIA can cause a thick coating to form in the back of the throat.  It can lead to breathing problems, paralysis, heart failure, and death.  Before vaccines, as many as 200,000 cases of diphtheria and hundreds of cases of tetanus were reported in the [place] each year. Since vaccination began, reports of cases for both diseases have dropped by about 99%. 2. Td vaccine Td vaccine can protect adolescents and adults from tetanus and diphtheria. Td is usually given as a booster dose every 10 years but it can also be given earlier after a severe and dirty wound or burn. Another vaccine, called Tdap, which protects against pertussis in addition to tetanus and diphtheria, is sometimes recommended instead of Td vaccine. Your doctor or the person giving you the vaccine can give you more information. Td may safely be given at the same time as other vaccines. 3. Some people should not get this vaccine  A person who has ever had a life-threatening allergic reaction after a previous dose  of any tetanus or diphtheria containing vaccine, OR has a severe allergy to any part of this vaccine, should not get Td vaccine. Tell the person giving the vaccine about any severe allergies.  Talk to your doctor if you: ? had severe pain or swelling after any vaccine containing diphtheria or tetanus, ? ever had a condition called Guillain Barre Syndrome (GBS), ? aren't feeling well on the day the shot is scheduled. 4. What are the risks from Td vaccine? With any medicine, including vaccines, there is a chance of side effects. These are usually mild and go away on their own. Serious reactions are also possible but are rare. Most people who get Td vaccine do not have any problems with it. Mild problems following Td vaccine: (Did not interfere with activities)  Pain where the shot was given (about 8 people in 10)  Redness or swelling where the shot was given (about 1 person in 4)  Mild fever (rare)  Headache (about 1 person in 4)  Tiredness (about 1 person in 4)  Moderate problems following Td vaccine: (Interfered with activities, but did not require medical attention)  Fever over 102F (rare)  Severe problems following Td vaccine: (Unable to perform usual activities; required medical attention)  Swelling, severe pain, bleeding and/or redness in the arm where the shot was given (rare).  Problems that could happen after any vaccine:  People sometimes faint after a medical procedure, including vaccination. Sitting or lying down for about 15 minutes can help prevent fainting, and injuries caused by a fall. Tell your doctor  if you feel dizzy, or have vision changes or ringing in the ears.  Some people get severe pain in the shoulder and have difficulty moving the arm where a shot was given. This happens very rarely.  Any medication can cause a severe allergic reaction. Such reactions from a vaccine are very rare, estimated at fewer than 1 in a million doses, and would happen within a  few minutes to a few hours after the vaccination. As with any medicine, there is a very remote chance of a vaccine causing a serious injury or death. The safety of vaccines is always being monitored. For more information, visit: http://www.aguilar.org/ 5. What if there is a serious reaction? What should I look for? Look for anything that concerns you, such as signs of a severe allergic reaction, very high fever, or unusual behavior. Signs of a severe allergic reaction can include hives, swelling of the face and throat, difficulty breathing, a fast heartbeat, dizziness, and weakness. These would usually start a few minutes to a few hours after the vaccination. What should I do?  If you think it is a severe allergic reaction or other emergency that can't wait, call 9-1-1 or get the person to the nearest hospital. Otherwise, call your doctor.  Afterward, the reaction should be reported to the Vaccine Adverse Event Reporting System (VAERS). Your doctor might file this report, or you can do it yourself through the VAERS web site at www.vaers.SamedayNews.es, or by calling 480 620 9820. ? VAERS does not give medical advice. 6. The National Vaccine Injury Compensation Program The [employer] Vaccine Injury Compensation Program (VICP) is a federal program that was created to compensate people who may have been injured by certain vaccines. Persons who believe they may have been injured by a vaccine can learn about the program and about filing a claim by calling 551-636-5907 or visiting the [place] website at GoldCloset.com.ee. There is a time limit to file a claim for compensation. 7. How can I learn more?  Ask your doctor. He or she can give you the vaccine package insert or suggest other sources of information.  Call your local or state health department.  Contact the Centers for Disease Control and Prevention (CDC): ? Call 858-443-8558 (1-800-CDC-INFO) ? Visit CDC's website at  http://hunter.com/ CDC Td Vaccine VIS (11/09/15) This information is not intended to replace advice given to you by your health care provider. Make sure you discuss any questions you have with your health care provider. Document Released: 05/14/2006 Document Revised: 04/06/2016 Document Reviewed: 04/06/2016 Elsevier Interactive Patient Education  2017 [employer].

## 2017-11-23 NOTE — Progress Notes (Signed)
Patient ID: [name] Branch, male    DOB: Jan 19, 1966  MRN: [id number]  CC: Follow-up   Subjective: [name] Branch is a 52 y.o. male who presents for chronic ds management.  [name], his caregiver ([facility]) is with him and provides most of the history.  Sister [name] Branch, who is his legal guardian (brought copy for our records), is also with him today.   His concerns today include:  52 year old male with history of mental retardation, sarcoidosis, schizophrenia, chronic systolic CHF (EF 01-02% 7253), HL,  colonic inertia colectomy 2016 (ileorectal anastomosis) and glaucoma  1.  Patient and caregiver voices no complaints. -Gained 10 lbs since last visit.  Over eating and snacking on chips and cookies b/w meals.  [name] tries to encourage him to eat more fruits -moving bowels ok, no blood in stools.   -care giver has a PRN med sheet with her that she would like for me to check off.  This includes meds like Tylenol 325mg  to use PRN for fever, Maalox PRN for upset stomach etc.  Copy kept for our records   Patient Active Problem List   Diagnosis Date Noted  . S/P total colectomy 07/20/2017  . Dizziness 10/12/2016  . Anemia of chronic disease 10/05/2014  . Colonic inertia s/p abdominal colectomy 09/25/2014 09/25/2014  . Hyperlipidemia 09/17/2008  . Glaucoma 10/10/2006  . Sarcoidosis 06/11/2006  . Schizophrenia, unspecified type ([place]) 06/11/2006  . Mental retardation 06/11/2006  . Chronic systolic CHF (congestive heart failure) ([place]) 06/11/2006  . 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     Current Outpatient Medications on File Prior to Visit  Medication Sig Dispense Refill  . cloZAPine (CLOZARIL) 100 MG tablet Take 100 mg by mouth 2 (two) times daily.     . cloZAPine (CLOZARIL) 25 MG tablet Take 25 mg by mouth 2 (two) times daily.    . fluvoxaMINE (LUVOX) 100 MG tablet Take 300 mg by mouth at bedtime.    . Multiple Vitamin (MULTIVITAMIN) capsule Take 1 capsule  by mouth daily. 90 capsule 3  . polyethylene glycol powder (GLYCOLAX/MIRALAX) powder TAKE 1/2 CAPFUL BY MOUTH TWICE DAILY 255 g 11  . pravastatin (PRAVACHOL) 40 MG tablet TAKE ONE TABLET EACH DAY 90 tablet 3  . travoprost, benzalkonium, (TRAVATAN) 0.004 % ophthalmic solution Place 1 drop into both eyes at bedtime.      No current facility-administered medications on file prior to visit.     No Known Allergies  Social History   Socioeconomic History  . Marital status: Single    Spouse name: Not on file  . Number of children: 0  . Years of education: Not on file  . Highest education level: Not on file  Occupational History    Employer: DISABLED  Social Needs  . Financial resource strain: Not on file  . Food insecurity:    Worry: Not on file    Inability: Not on file  . Transportation needs:    Medical: Not on file    Non-medical: Not on file  Tobacco Use  . Smoking status: Never Smoker  . Smokeless tobacco: Never Used  Substance and Sexual Activity  . Alcohol use: No    Alcohol/week: 0.0 oz  . Drug use: No  . Sexual activity: Not on file    Comment:    Lifestyle  . Physical activity:    Days per week: Not on file    Minutes per session: Not on file  . Stress: Not on file  Relationships  . Social connections:    Talks on phone: Not on file    Gets together: Not on file    Attends religious service: Not on file    Active member of club or organization: Not on file    Attends meetings of clubs or organizations: Not on file    Relationship status: Not on file  . Intimate partner violence:    Fear of current or ex partner: Not on file    Emotionally abused: Not on file    Physically abused: Not on file    Forced sexual activity: Not on file  Other Topics Concern  . Not on file  Social History Narrative   Has a caretaker    Family History  Problem Relation Age of Onset  . Diabetes Mother   . Colon cancer Neg Hx     Past Surgical History:  Procedure  Laterality Date  . COLECTOMY N/A 09/25/2014   Procedure: OPEN ABDOMINAL COLECTOMY;  Surgeon: Michael Boston, MD;  Location: [facility];  Service: General;  Laterality: N/A;  . groin surgery     boil drained  . PROCTOSCOPY N/A 09/25/2014   Procedure: RIGID PROCTOSCOPY;  Surgeon: Michael Boston, MD;  Location: [facility];  Service: General;  Laterality: N/A;    ROS: Review of Systems  Constitutional: Negative for activity change.       Walking daily for 30 mins  Respiratory: Negative for cough, chest tightness and shortness of breath.   Cardiovascular: Negative for chest pain and leg swelling.  Gastrointestinal: Negative for abdominal pain and blood in stool.  Musculoskeletal: Negative for arthralgias.  Psychiatric/Behavioral:       Saw his psychiatrist 07/2017.  He goes in Q 3 mths.  Leland Her at [facility].    PHYSICAL EXAM: BP 118/76   Pulse (!) 104   Temp 98.3 F (36.8 C) (Oral)   Resp 16   Wt 198 lb (89.8 kg)   SpO2 98%   BMI 25.42 kg/m   Wt Readings from Last 3 Encounters:  11/23/17 198 lb (89.8 kg)  07/26/17 190 lb 6.4 oz (86.4 kg)  07/20/17 188 lb (85.3 kg)    Physical Exam  General appearance - alert, well appearing, and in no distress Mental status - pt voices no concerns.  Most of the hx obtained from care giver Mouth - mucous membranes moist, pharynx normal without lesions Neck - supple, no significant adenopathy Chest - clear to auscultation, no wheezes, rales or rhonchi, symmetric air entry Heart - normal rate, regular rhythm, normal S1, S2, no murmurs, rubs, clicks or gallops Extremities - peripheral pulses normal, no pedal edema, no clubbing or cyanosis   ASSESSMENT AND PLAN: 1. Unintended weight gain -Encourage patient to snack on fruits or not rather than potato chips and other junk treats.  2. Hyperlipidemia, unspecified hyperlipidemia type - CBC - Comprehensive metabolic panel  3. Schizophrenia, unspecified type [facility]) Patient is followed by Dr. Val Eagle.  I received copy of blood tests that were done recently that included lipid profile that revealed LDL of 113 and a hemoglobin A1c which was normal.  4. Chronic systolic CHF (congestive heart failure) (HCC) Clinically compensated and asymptomatic  5. Need for Tdap vaccination    Patient was given the opportunity to ask questions.  Patient verbalized understanding of the plan and was able to repeat key elements of the plan.   Orders Placed This Encounter  Procedures  . Tdap vaccine greater than or equal to  7yo IM  . CBC  . Comprehensive metabolic panel     Requested Prescriptions   Signed Prescriptions Disp Refills  . Tdap (BOOSTRIX) 5-2.5-18.5 LF-MCG/0.5 injection 0.5 mL 0    Sig: Inject 0.5 mLs into the muscle once for 1 dose.    Return in about 4 months (around 03/25/2018).  Karle Plumber, MD, FACP

## 2017-11-24 LAB — COMPREHENSIVE METABOLIC PANEL
ALT: 17 IU/L (ref 0–44)
AST: 20 IU/L (ref 0–40)
Albumin/Globulin Ratio: 1.7 (ref 1.2–2.2)
Albumin: 4.3 g/dL (ref 3.5–5.5)
Alkaline Phosphatase: 83 IU/L (ref 39–117)
BUN/Creatinine Ratio: 8 — ABNORMAL LOW (ref 9–20)
BUN: 9 mg/dL (ref 6–24)
Bilirubin Total: 0.6 mg/dL (ref 0.0–1.2)
CO2: 24 mmol/L (ref 20–29)
Calcium: 9.4 mg/dL (ref 8.7–10.2)
Chloride: 101 mmol/L (ref 96–106)
Creatinine, Ser: 1.08 mg/dL (ref 0.76–1.27)
GFR calc Af Amer: 91 mL/min/{1.73_m2} (ref >59)
GFR calc non Af Amer: 79 mL/min/{1.73_m2} (ref >59)
Globulin, Total: 2.5 g/dL (ref 1.5–4.5)
Glucose: 89 mg/dL (ref 65–99)
Potassium: 4.3 mmol/L (ref 3.5–5.2)
Sodium: 138 mmol/L (ref 134–144)
Total Protein: 6.8 g/dL (ref 6.0–8.5)

## 2017-11-24 LAB — CBC
HEMOGLOBIN: 13.6 g/dL (ref 13.0–17.7)
Hematocrit: 42.7 % (ref 37.5–51.0)
MCH: 29.2 pg (ref 26.6–33.0)
MCHC: 31.9 g/dL (ref 31.5–35.7)
MCV: 92 fL (ref 79–97)
Platelets: 171 10*3/uL (ref 150–379)
RBC: 4.66 x10E6/uL (ref 4.14–5.80)
RDW: 14.4 % (ref 12.3–15.4)
WBC: 4.9 10*3/uL (ref 3.4–10.8)

## 2017-12-11 ENCOUNTER — Encounter: Payer: Self-pay | Admitting: Internal Medicine

## 2017-12-11 ENCOUNTER — Ambulatory Visit: Payer: Medicare Other | Attending: Internal Medicine | Admitting: Internal Medicine

## 2017-12-11 ENCOUNTER — Ambulatory Visit (HOSPITAL_COMMUNITY)
Admission: RE | Admit: 2017-12-11 | Discharge: 2017-12-11 | Disposition: A | Discharge status: Home / Self Care | Payer: Medicare Other | Source: Ambulatory Visit | Attending: Internal Medicine | Admitting: Internal Medicine

## 2017-12-11 VITALS — BP 128/81 | HR 97 | Temp 98.1°F | Resp 16 | Wt 196.2 lb

## 2017-12-11 DIAGNOSIS — D869 Sarcoidosis, unspecified: Secondary | ICD-10-CM | POA: Insufficient documentation

## 2017-12-11 DIAGNOSIS — Z833 Family history of diabetes mellitus: Secondary | ICD-10-CM | POA: Diagnosis not present

## 2017-12-11 DIAGNOSIS — G119 Hereditary ataxia, unspecified: Secondary | ICD-10-CM | POA: Diagnosis not present

## 2017-12-11 DIAGNOSIS — G3281 Cerebellar ataxia in diseases classified elsewhere: Secondary | ICD-10-CM

## 2017-12-11 DIAGNOSIS — F79 Unspecified intellectual disabilities: Secondary | ICD-10-CM | POA: Insufficient documentation

## 2017-12-11 DIAGNOSIS — Z79899 Other long term (current) drug therapy: Secondary | ICD-10-CM | POA: Insufficient documentation

## 2017-12-11 DIAGNOSIS — R269 Unspecified abnormalities of gait and mobility: Secondary | ICD-10-CM

## 2017-12-11 DIAGNOSIS — Z9049 Acquired absence of other specified parts of digestive tract: Secondary | ICD-10-CM | POA: Insufficient documentation

## 2017-12-11 DIAGNOSIS — I10 Essential (primary) hypertension: Secondary | ICD-10-CM | POA: Diagnosis not present

## 2017-12-11 DIAGNOSIS — I5022 Chronic systolic (congestive) heart failure: Secondary | ICD-10-CM | POA: Insufficient documentation

## 2017-12-11 DIAGNOSIS — H402230 Chronic angle-closure glaucoma, bilateral, stage unspecified: Secondary | ICD-10-CM | POA: Diagnosis not present

## 2017-12-11 DIAGNOSIS — E785 Hyperlipidemia, unspecified: Secondary | ICD-10-CM | POA: Diagnosis not present

## 2017-12-11 DIAGNOSIS — H402290 Chronic angle-closure glaucoma, unspecified eye, stage unspecified: Secondary | ICD-10-CM | POA: Insufficient documentation

## 2017-12-11 DIAGNOSIS — F209 Schizophrenia, unspecified: Secondary | ICD-10-CM | POA: Diagnosis not present

## 2017-12-11 NOTE — Progress Notes (Signed)
Patient ID: [name] Branch, male    DOB: 04/23/1966  MRN: [id number]  CC: FMLA and balance   Subjective: [name] Branch is a 52 y.o. male who presents for UC visit [name] Branch, [name] Branch, who is his legal guardian is with him today. His concerns today include:  52 year old male with history of mental retardation, sarcoidosis, schizophrenia, chronic systolic CHF (EF 61-44% 3154), HL, colonic inertia colectomy 2016 (ileorectal anastomosis) and glaucoma  Pt's care giver [name], from [place], died since I last saw them less than 1 mth ago Pt now living with [name] Branch.  She is working with social services and an agency called [facility] to try and get him placed in another alternative family living situation.  For now, he goes to a day program from 8 AM to 2 PM then A gets him from the day program and keeps him until 7 PM.  [name] Branch works full-time at [employer] since the death of [name] Branch to take care of the patient and to review a few AVLs she has an FMLA form for me to complete for her.  [name] Branch is concerned that his balance seemed off for the past 4 to 5 days.  Patient has not complained of any headaches or dizziness.  She notes that when they are out in public he is wanting to hold onto her shoulder and also walks close to walls as if he is having problems seeing.  She has not noticed any weakness.  His appetite is good.  Patient has history of glaucoma and last saw his optometrist in August of last year.   Patient Active Problem List   Diagnosis Date Noted  . S/P total colectomy 07/20/2017  . Dizziness 10/12/2016  . Anemia of chronic disease 10/05/2014  . Colonic inertia s/p abdominal colectomy 09/25/2014 09/25/2014  . Hyperlipidemia 09/17/2008  . Glaucoma 10/10/2006  . Sarcoidosis 06/11/2006  . Schizophrenia, unspecified type ([place]) 06/11/2006  . Mental retardation 06/11/2006  . Chronic systolic CHF (congestive heart failure) ([place]) 06/11/2006  . Neurogenic bowel  06/11/2006     Current Outpatient Medications on File Prior to Visit  Medication Sig Dispense Refill  . latanoprost (XALATAN) 0.005 % ophthalmic solution Place 1 drop into both eyes at bedtime.    . cloZAPine (CLOZARIL) 100 MG tablet Take 100 mg by mouth 2 (two) times daily.     . cloZAPine (CLOZARIL) 25 MG tablet Take 25 mg by mouth 2 (two) times daily.    . fluvoxaMINE (LUVOX) 100 MG tablet Take 300 mg by mouth at bedtime.    . Multiple Vitamin (MULTIVITAMIN) capsule Take 1 capsule by mouth daily. 90 capsule 3  . polyethylene glycol powder (GLYCOLAX/MIRALAX) powder TAKE 1/2 CAPFUL BY MOUTH TWICE DAILY 255 g 11  . pravastatin (PRAVACHOL) 40 MG tablet TAKE ONE TABLET EACH DAY 90 tablet 3   No current facility-administered medications on file prior to visit.     No Known Allergies  Social History   Socioeconomic History  . Marital status: Single    Spouse name: Not on file  . Number of children: 0  . Years of education: Not on file  . Highest education level: Not on file  Occupational History    Employer: DISABLED  Social Needs  . Financial resource strain: Not on file  . Food insecurity:    Worry: Not on file    Inability: Not on file  . Transportation needs:    Medical: Not on file  Non-medical: Not on file  Tobacco Use  . Smoking status: Never Smoker  . Smokeless tobacco: Never Used  Substance and Sexual Activity  . Alcohol use: No    Alcohol/week: 0.0 oz  . Drug use: No  . Sexual activity: Not on file    Comment:    Lifestyle  . Physical activity:    Days per week: Not on file    Minutes per session: Not on file  . Stress: Not on file  Relationships  . Social connections:    Talks on phone: Not on file    Gets together: Not on file    Attends religious service: Not on file    Active member of club or organization: Not on file    Attends meetings of clubs or organizations: Not on file    Relationship status: Not on file  . Intimate partner violence:     Fear of current or ex partner: Not on file    Emotionally abused: Not on file    Physically abused: Not on file    Forced sexual activity: Not on file  Other Topics Concern  . Not on file  Social History Narrative   Has a caretaker    Family History  Problem Relation Age of Onset  . Diabetes Mother   . Colon cancer Neg Hx     Past Surgical History:  Procedure Laterality Date  . COLECTOMY N/A 09/25/2014   Procedure: OPEN ABDOMINAL COLECTOMY;  Surgeon: Michael Boston, MD;  Location: [facility];  Service: General;  Laterality: N/A;  . groin surgery     boil drained  . PROCTOSCOPY N/A 09/25/2014   Procedure: RIGID PROCTOSCOPY;  Surgeon: Michael Boston, MD;  Location: [facility];  Service: General;  Laterality: N/A;    ROS: Review of Systems Negative except as above PHYSICAL EXAM: BP 128/81   Pulse 97   Temp 98.1 F (36.7 C) (Oral)   Resp 16   Wt 196 lb 3.2 oz (89 kg)   SpO2 99%   BMI 25.19 kg/m   Physical Exam  General appearance - alert, well appearing, and in no distress Mental status -patient was able to correctly tell me the day of the week, the month and the year.  He also recognizes his [name] Branch and is able to tell me her name. Neurological - cranial nerves II through XII intact except pupils are pinpoint Gait: Low feet to floor clearance.he does appear to walk with a list to the right.however he is observed walking in the hall and is not noted to hold onto the walls.  Patient follows commands inconsistently but power in all 4 extremities rated as 4+/5.  Gross sensation intact.  I was unable to get him to understand completely to do a Romberg test   ASSESSMENT AND PLAN: 1. Gait disturbance I do not suspect that patient has had a stroke.  I suspect he is holding onto his [name] Branch when they are out in public may be due to limited vision and his uncertainty of his surroundings given that his usual caregiver is now deceased.  However we will get a CAT scan of the head to evaluate for any  acute event.  I also recommend that [name] Branch contact his optometrist and make an appointment to get better understanding of the degree of his vision limitation -I went through the medicines that she bought with him and did a reconciliation -FMLA form will be completed 2. 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 Cerebellar ataxia in diseases classified elsewhere [facility]) - CT Head Wo Contrast; Future  Patient was given the opportunity to ask questions.  Patient verbalized understanding of the plan and was able to repeat key elements of the plan.   Orders Placed This Encounter  Procedures  . CT Head Wo Contrast     Requested Prescriptions    No prescriptions requested or ordered in this encounter    No follow-ups on file.  Karle Plumber, MD, FACP

## 2017-12-13 ENCOUNTER — Telehealth: Payer: Self-pay

## 2017-12-13 NOTE — Telephone Encounter (Signed)
Contacted number on file for pt to go over CT results. Is unable to contact [name] due to her passing. Pt sister number is not on file unable to call her.  Left a detailed vm informing pt or sister the results of CT.   If pt sister calls back please give results: CT of head was negative for any acute stroke.

## 2017-12-27 ENCOUNTER — Telehealth: Payer: Self-pay

## 2017-12-28 ENCOUNTER — Telehealth: Payer: Self-pay | Admitting: *Deleted

## 2017-12-28 NOTE — Telephone Encounter (Signed)
Patient verified DOB Patients caregiver states patients gait is worse and his need to hold to her or items has increased. Care taker is going to report to the ED for evaluation. States patient vision was evaluated.

## 2017-12-28 NOTE — Telephone Encounter (Signed)
JA 

## 2017-12-30 ENCOUNTER — Emergency Department (HOSPITAL_COMMUNITY): Payer: Medicare Other

## 2017-12-30 ENCOUNTER — Encounter (HOSPITAL_COMMUNITY): Payer: Self-pay | Admitting: Emergency Medicine

## 2017-12-30 ENCOUNTER — Emergency Department (HOSPITAL_COMMUNITY)
Admission: EM | Admit: 2017-12-30 | Discharge: 2017-12-30 | Disposition: A | Discharge status: Home / Self Care | Payer: Medicare Other | Attending: Emergency Medicine | Admitting: Emergency Medicine

## 2017-12-30 DIAGNOSIS — R4781 Slurred speech: Secondary | ICD-10-CM | POA: Diagnosis not present

## 2017-12-30 DIAGNOSIS — R4182 Altered mental status, unspecified: Secondary | ICD-10-CM | POA: Insufficient documentation

## 2017-12-30 DIAGNOSIS — I5022 Chronic systolic (congestive) heart failure: Secondary | ICD-10-CM | POA: Diagnosis not present

## 2017-12-30 DIAGNOSIS — Y69 Unspecified misadventure during surgical and medical care: Secondary | ICD-10-CM | POA: Diagnosis not present

## 2017-12-30 DIAGNOSIS — T887XXA Unspecified adverse effect of drug or medicament, initial encounter: Secondary | ICD-10-CM | POA: Diagnosis not present

## 2017-12-30 DIAGNOSIS — Z79899 Other long term (current) drug therapy: Secondary | ICD-10-CM | POA: Diagnosis not present

## 2017-12-30 DIAGNOSIS — R4701 Aphasia: Secondary | ICD-10-CM | POA: Diagnosis present

## 2017-12-30 LAB — COMPREHENSIVE METABOLIC PANEL
ALK PHOS: 85 U/L (ref 38–126)
ALT: 43 U/L (ref 17–63)
ANION GAP: 5 (ref 5–15)
AST: 24 U/L (ref 15–41)
Albumin: 3.7 g/dL (ref 3.5–5.0)
BUN: 8 mg/dL (ref 6–20)
CALCIUM: 9 mg/dL (ref 8.9–10.3)
CHLORIDE: 106 mmol/L (ref 101–111)
CO2: 28 mmol/L (ref 22–32)
CREATININE: 1 mg/dL (ref 0.61–1.24)
Glucose, Bld: 85 mg/dL (ref 65–99)
Potassium: 4.1 mmol/L (ref 3.5–5.1)
Sodium: 139 mmol/L (ref 135–145)
Total Bilirubin: 0.5 mg/dL (ref 0.3–1.2)
Total Protein: 6.8 g/dL (ref 6.5–8.1)

## 2017-12-30 LAB — PROTIME-INR
INR: 0.96
PROTHROMBIN TIME: 12.7 s (ref 11.4–15.2)

## 2017-12-30 LAB — APTT: APTT: 31 s (ref 24–36)

## 2017-12-30 LAB — CBC
HCT: 42 % (ref 39.0–52.0)
HEMOGLOBIN: 13 g/dL (ref 13.0–17.0)
MCH: 29.3 pg (ref 26.0–34.0)
MCHC: 31 g/dL (ref 30.0–36.0)
MCV: 94.6 fL (ref 78.0–100.0)
PLATELETS: 169 10*3/uL (ref 150–400)
RBC: 4.44 MIL/uL (ref 4.22–5.81)
RDW: 15.9 % — AB (ref 11.5–15.5)
WBC: 6.9 10*3/uL (ref 4.0–10.5)

## 2017-12-30 LAB — DIFFERENTIAL
Abs Immature Granulocytes: 0 10*3/uL (ref 0.0–0.1)
BASOS ABS: 0 10*3/uL (ref 0.0–0.1)
BASOS PCT: 0 %
EOS PCT: 2 %
Eosinophils Absolute: 0.1 10*3/uL (ref 0.0–0.7)
Immature Granulocytes: 0 %
Lymphocytes Relative: 18 %
Lymphs Abs: 1.3 10*3/uL (ref 0.7–4.0)
MONO ABS: 0.7 10*3/uL (ref 0.1–1.0)
MONOS PCT: 10 %
NEUTROS ABS: 4.8 10*3/uL (ref 1.7–7.7)
NEUTROS PCT: 70 %

## 2017-12-30 LAB — I-STAT TROPONIN, ED: TROPONIN I, POC: 0 ng/mL (ref 0.00–0.08)

## 2017-12-30 NOTE — Discharge Instructions (Signed)
Stop taking the clonazepam.  Discuss with psychiatrist about the medications he is on and possibly decreasing the doses

## 2017-12-30 NOTE — ED Notes (Signed)
Patient transported to MRI 

## 2017-12-30 NOTE — ED Notes (Signed)
IV team at the bedside. 

## 2017-12-30 NOTE — ED Notes (Signed)
ED Provider at bedside. 

## 2017-12-30 NOTE — ED Provider Notes (Signed)
[facility] EMERGENCY DEPARTMENT Provider Note   CSN: [id number] Arrival date & time: 12/30/17  1209     History   Chief Complaint Chief Complaint  Patient presents with  . Aphasia    HPI [name] Branch is a 52 y.o. male.  Patient is a 52 year old male with a history of autism, schizophrenia, neurogenic bowel, sarcoidosis, cardiomyopathy who is presenting today with his sister for change in behavior and difficulty walking.  His sister is his guardian and states that in August 27, 2023 the patient's mother died whom he lived with and then then may his caregiver who he had lived with passed away.  He has come to live with his sister May 8 and she states that since coming to live with her he seems to be progressively getting worse.  He seems to have issues with his balance and trouble walking around.  He is always holding onto things and seems off balance.  She states that he eats and sleeps well.  He seems to get along with her family well.  She is giving him all of his medications as prescribed but is not sure if he was actually receiving those medications when he was living with his caregiver.  She took him to [facility] and had his eyes evaluated by his ophthalmologist and was told that his glaucoma is fine.  Also she saw the PCP and they were going to get some blood work and saw his psychiatrist who started him on Klonopin.  Since starting the Klonopin over a week ago patient's symptoms seem to have become worse.  He now seems unable to participate at his day program he is tired all the time.  He had some slurred speech this morning at church after taking his medications.  She describes it as him seeming like he is always in a fog.  He has had no fever, cough, chest pain, shortness of breath, abdominal pain.  She spoke with her doctors but states since he was not getting better over the weekend she wanted to come and make sure he was okay.  The history is provided by a  caregiver.    Past Medical History:  Diagnosis Date  . ABSCESS, ANAL/RECTAL REGIONS 08/18/2006   Annotation: Fournier gangrene Qualifier: History of  By: Garnette Scheuermann MD, Arlee Muslim    . Cardiomyopathy   . Colonic inertia   . Constipation   . Glaucoma   . Hyperlipidemia   . Hypovolemic shock ([place])   . Mental retardation   . 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   Annotation: chronic with stercoral ulcer  Qualifier: Diagnosis of  By: Garnette Scheuermann MD, Arlee Muslim    . Obstruction of colon ([facility]CC)    recurrent  . Sarcoidosis   . Schizophrenia ([place])   . 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 S/P total colectomy 07/20/2017  . Dizziness 10/12/2016  . Anemia of chronic disease 10/05/2014  . Colonic inertia s/p abdominal colectomy 09/25/2014 09/25/2014  . Hyperlipidemia 09/17/2008  . Glaucoma 10/10/2006  . Sarcoidosis 06/11/2006  . Schizophrenia, unspecified type ([place]) 06/11/2006  . Mental retardation 06/11/2006  . Chronic systolic CHF (congestive heart failure) ([place]) 06/11/2006  . 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    Past Surgical History:  Procedure Laterality Date  . COLECTOMY N/A 09/25/2014   Procedure: OPEN ABDOMINAL COLECTOMY;  Surgeon: Michael Boston, MD;  Location: [facility] ORS;  Service: General;  Laterality: N/A;  . groin surgery  boil drained  . PROCTOSCOPY N/A 09/25/2014   Procedure: RIGID PROCTOSCOPY;  Surgeon: Michael Boston, MD;  Location: [facility] ORS;  Service: General;  Laterality: N/A;        Home Medications    Prior to Admission medications   Medication Sig Start Date End Date Taking? Authorizing Provider  cloZAPine (CLOZARIL) 100 MG tablet Take 100 mg by mouth 2 (two) times daily.     [provider]  cloZAPine (CLOZARIL) 25 MG tablet Take 25 mg by mouth 2 (two) times daily.    [provider]  fluvoxaMINE (LUVOX) 100 MG tablet Take 300 mg by mouth at bedtime.    [provider]  latanoprost (XALATAN) 0.005 % ophthalmic  solution Place 1 drop into both eyes at bedtime.    [provider]  Multiple Vitamin (MULTIVITAMIN) capsule Take 1 capsule by mouth daily. 08/06/17   Ladell Pier, MD  polyethylene glycol powder (GLYCOLAX/MIRALAX) powder TAKE 1/2 CAPFUL BY MOUTH TWICE DAILY 11/12/17   Esterwood, Amy S, PA-C  pravastatin (PRAVACHOL) 40 MG tablet TAKE ONE TABLET EACH DAY 08/06/17   Ladell Pier, MD    Family History Family History  Problem Relation Age of Onset  . Diabetes Mother   . Colon cancer Neg Hx     Social History Social History   Tobacco Use  . Smoking status: Never Smoker  . Smokeless tobacco: Never Used  Substance Use Topics  . Alcohol use: No    Alcohol/week: 0.0 oz  . Drug use: No     Allergies   Patient has no known allergies.   Review of Systems Review of Systems  All other systems reviewed and are negative.    Physical Exam Updated Vital Signs BP 131/84 (BP Location: Right Arm)   Pulse 89   Temp (!) 97.4 F (36.3 C) (Oral)   Resp 16   SpO2 99%   Physical Exam  Constitutional: He appears well-developed and well-nourished. No distress.  HENT:  Head: Normocephalic and atraumatic.  Mouth/Throat: Oropharynx is clear and moist.  Eyes: Pupils are equal, round, and reactive to light. Conjunctivae and EOM are normal.  Neck: Normal range of motion. Neck supple.  Cardiovascular: Normal rate, regular rhythm and intact distal pulses.  No murmur heard. Pulmonary/Chest: Effort normal and breath sounds normal. No respiratory distress. He has no wheezes. He has no rales.  Abdominal: Soft. He exhibits no distension. There is no tenderness. There is no rebound and no guarding.  Musculoskeletal: Normal range of motion. He exhibits no edema or tenderness.  Neurological: He is alert.  Oriented to person and place.  Will follow commands.  Finger-to-nose without difficulty.  Patient has difficulty comprehending heel-to-shin.  5 out of 5 strength in all 4 extremities.   Sensation is intact.  No visual field cuts.  Extraocular movements are normal.  Patient does not speak much here but his answers seem to clear  Skin: Skin is warm and dry. No rash noted. No erythema.  Psychiatric:  Calm and cooperative  Nursing note and vitals reviewed.    ED Treatments / Results  Labs (all labs ordered are listed, but only abnormal results are displayed) Labs Reviewed  CBC - Abnormal; Notable for the following components:      Result Value   RDW 15.9 (*)    All other components within normal limits  PROTIME-INR  APTT  DIFFERENTIAL  COMPREHENSIVE METABOLIC PANEL  TSH  T4  I-STAT TROPONIN, ED  CBG MONITORING, ED  EKG EKG Interpretation  Date/Time:  Sunday December 30 2017 12:45:48 EDT Ventricular Rate:  91 PR Interval:  154 QRS Duration: 144 QT Interval:  362 QTC Calculation: 445 R Axis:   -33 Text Interpretation:  Normal sinus rhythm Left axis deviation Right bundle branch block Septal infarct , age undetermined No significant change since last tracing Confirmed by ,  ([id number]) on 12/30/2017 2:22:15 PM   Radiology Ct Head Wo Contrast  Result Date: 12/30/2017 CLINICAL DATA:  Slurred speech. EXAM: CT HEAD WITHOUT CONTRAST TECHNIQUE: Contiguous axial images were obtained from the base of the skull through the vertex without intravenous contrast. COMPARISON:  Dec 11, 2017 FINDINGS: Brain: No subdural, epidural, or subarachnoid hemorrhage. Cerebellum, brainstem, and basal cisterns are normal. Ventricles and sulci are unremarkable. No acute cortical ischemia or infarct. No mass effect or midline shift. Vascular: No hyperdense vessel or unexpected calcification. Skull: Normal. Negative for fracture or focal lesion. Sinuses/Orbits: Mucosal thickening in the sphenoid sinuses. No air-fluid levels. Paranasal sinuses, mastoid air cells, and middle ears are otherwise normal. Other: None. IMPRESSION: No acute intracranial abnormalities. Electronically Signed   By:  David  Williams III M.D   On: 12/30/2017 13:13   Mr Brain Wo Contrast  Result Date: 12/30/2017 CLINICAL DATA:  Slurred speech. EXAM: MRI HEAD WITHOUT CONTRAST TECHNIQUE: Multiplanar, multiecho pulse sequences of the brain and surrounding structures were obtained without intravenous contrast. COMPARISON:  Head CT 12/30/2017 FINDINGS: Some sequences are mildly motion degraded. Brain: There is no evidence of acute infarct, intracranial hemorrhage, mass, midline shift, or extra-axial fluid collection. The ventricles and sulci are within normal limits. The brain is normal in signal. Vascular: Major intracranial vascular flow voids are preserved. Skull and upper cervical spine: Unremarkable bone marrow signal. Sinuses/Orbits: Unremarkable orbits. Mild right sphenoid sinus mucosal thickening. Clear mastoid air cells. Other: None. IMPRESSION: Negative brain MRI. Electronically Signed   By: Allen  Grady M.D.   On: 12/30/2017 18:08    Procedures Procedures (including critical care time)  Medications Ordered in ED Medications - No data to display   Initial Impression / Assessment and Plan / ED Course  I have reviewed the triage vital signs and the nursing notes.  Pertinent labs & imaging results that were available during my care of the patient were reviewed by me and considered in my medical decision making (see chart for details).     52  year old male with MR and schizophrenia presenting today with symptoms of lethargy, slurred speech, difficulty walking that is worsened over the last month that he has lived with his sister.  Patient's was started on Klonopin over a week ago and sister notes his symptoms have worsened.  Prior to starting that however he was having some issues with his gait.  Sister states that he is recently had multiple deaths and has had to move in with her.  However it seems that he gets along with her family and she denies any aggressive behavior.  She is giving him multiple  psychiatric medications however it is unclear if he was taking those regularly at the home he used to live back.  Patient is calm and cooperative on exam.  There are no definitive neurologic deficits however exam is technically difficult due to patient's comprehension.  He is older and has risk factors for stroke.  Head CT is negative but will do an MRI to ensure no other pathology.  No evidence of diabetes with a normal blood sugar.  Renal function electrolytes are within normal limits.  TSH is pending.  Feel most likely patient's symptoms are medication related.  6:56 PM Labs are reassuring.  TSH was unable to be obtained after multiple sticks the patient will follow up with PCP about this.  MRI of the brain was normal.  Feel most likely patient's symptoms are medication related.  Discussed this with his legal guardian and she will take him to his psychiatrist office tomorrow.  Final Clinical Impressions(s) / ED Diagnoses   Final diagnoses:  Altered mental status, unspecified altered mental status type  Non-dose-related adverse reaction to medication, initial encounter    ED Discharge Orders    None       Blanchie Dessert, MD 12/30/17 1858

## 2017-12-30 NOTE — ED Notes (Signed)
IV attempted without success. 

## 2017-12-30 NOTE — ED Triage Notes (Addendum)
Patient's guardian brought patient in for slurred speech. States he was church and a church member noticed he was slurring his speech. Guardian states last time she heard him speak was at 2000 yesterday and his speech was normal. Guardian reports history of autism and at baseline requires significant amount of care. Grip strength equal, speech slurred, no facial droop, no drift, alert and oriented, no vision changes.

## 2017-12-30 NOTE — ED Notes (Signed)
IV team unsuccessful  

## 2017-12-31 ENCOUNTER — Ambulatory Visit: Payer: Medicare Other | Attending: Nurse Practitioner | Admitting: Nurse Practitioner

## 2017-12-31 VITALS — BP 131/84 | HR 97 | Temp 97.8°F | Ht 74.0 in | Wt 211.0 lb

## 2017-12-31 DIAGNOSIS — F209 Schizophrenia, unspecified: Secondary | ICD-10-CM | POA: Insufficient documentation

## 2017-12-31 DIAGNOSIS — Z833 Family history of diabetes mellitus: Secondary | ICD-10-CM | POA: Diagnosis not present

## 2017-12-31 DIAGNOSIS — E785 Hyperlipidemia, unspecified: Secondary | ICD-10-CM | POA: Diagnosis not present

## 2017-12-31 DIAGNOSIS — Z79899 Other long term (current) drug therapy: Secondary | ICD-10-CM | POA: Insufficient documentation

## 2017-12-31 DIAGNOSIS — R55 Syncope and collapse: Secondary | ICD-10-CM | POA: Diagnosis present

## 2017-12-31 DIAGNOSIS — Z9889 Other specified postprocedural states: Secondary | ICD-10-CM | POA: Diagnosis not present

## 2017-12-31 LAB — POCT URINALYSIS DIPSTICK
Bilirubin, UA: NEGATIVE
GLUCOSE UA: NEGATIVE
Ketones, UA: NEGATIVE
LEUKOCYTES UA: NEGATIVE
Nitrite, UA: NEGATIVE
PH UA: 7 (ref 5.0–8.0)
Protein, UA: NEGATIVE
RBC UA: NEGATIVE
Spec Grav, UA: <=1.005 — AB (ref 1.010–1.025)
UROBILINOGEN UA: 0.2 U/dL

## 2017-12-31 NOTE — Patient Instructions (Signed)
Hyperthyroidism Hyperthyroidism is when the thyroid is too active (overactive). Your thyroid is a large gland that is located in your neck. The thyroid helps to control how your body uses food (metabolism). When your thyroid is overactive, it produces too much of a hormone called thyroxine. What are the causes? Causes of hyperthyroidism may include:  Graves disease. This is when your immune system attacks the thyroid gland. This is the most common cause.  Inflammation of the thyroid gland.  Tumor in the thyroid gland or somewhere else.  Excessive use of thyroid medicines, including: ? Prescription thyroid supplement. ? Herbal supplements that mimic thyroid hormones.  Solid or fluid-filled lumps within your thyroid gland (thyroid nodules).  Excessive ingestion of iodine.  What increases the risk?  Being male.  Having a family history of thyroid conditions. What are the signs or symptoms? Signs and symptoms of hyperthyroidism may include:  Nervousness.  Inability to tolerate heat.  Unexplained weight loss.  Diarrhea.  Change in the texture of hair or skin.  Heart skipping beats or making extra beats.  Rapid heart rate.  Loss of menstruation.  Shaky hands.  Fatigue.  Restlessness.  Increased appetite.  Sleep problems.  Enlarged thyroid gland or nodules.  How is this diagnosed? Diagnosis of hyperthyroidism may include:  Medical history and physical exam.  Blood tests.  Ultrasound tests.  How is this treated? Treatment may include:  Medicines to control your thyroid.  Surgery to remove your thyroid.  Radiation therapy.  Follow these instructions at home:  Take medicines only as directed by your health care provider.  Do not use any tobacco products, including cigarettes, chewing tobacco, or electronic cigarettes. If you need help quitting, ask your health care provider.  Do not exercise or do physical activity until your health care provider  approves.  Keep all follow-up appointments as directed by your health care provider. This is important. Contact a health care provider if:  Your symptoms do not get better with treatment.  You have fever.  You are taking thyroid replacement medicine and you: ? Have depression. ? Feel mentally and physically slow. ? Have weight gain. Get help right away if:  You have decreased alertness or a change in your awareness.  You have abdominal pain.  You feel dizzy.  You have a rapid heartbeat.  You have an irregular heartbeat. This information is not intended to replace advice given to you by your health care provider. Make sure you discuss any questions you have with your health care provider. Document Released: 07/17/2005 Document Revised: 12/16/2015 Document Reviewed: 12/02/2013 Elsevier Interactive Patient Education  2018 [employer].  Near-Syncope Near-syncope is when you suddenly get weak or dizzy, or you feel like you might pass out (faint). During an episode of near-syncope, you may:  Feel dizzy or light-headed.  Feel sick to your stomach (nauseous).  See all white or all black.  Have cold, clammy skin.  If you passed out, get help right away.Call your local emergency services (911 in the [place] Do not drive yourself to the hospital. Follow these instructions at home: Pay attention to any changes in your symptoms. Take these actions to help with your condition:  Have someone stay with you until you feel stable.  Do not drive, use machinery, or play sports until your doctor says it is okay.  Keep all follow-up visits as told by your doctor. This is important.  If you start to feel like you might pass out, lie down right away  and raise (elevate) your feet above the level of your heart. Breathe deeply and steadily. Wait until all of the symptoms are gone.  Drink enough fluid to keep your pee (urine) clear or pale yellow.  If you are taking blood pressure or heart  medicine, get up slowly and spend many minutes getting ready to sit and then stand. This can help with dizziness.  Take over-the-counter and prescription medicines only as told by your doctor.  Get help right away if:  You have a very bad headache.  You have unusual pain in your chest, tummy, or back.  You are bleeding from your mouth or rectum.  You have black or tarry poop (stool).  You have a very fast or uneven heartbeat (palpitations).  You pass out one time or more than once.  You have jerky movements that you cannot control (seizure).  You are confused.  You have trouble walking.  You are very weak.  You have vision problems. These symptoms may be an emergency. Do not wait to see if the symptoms will go away. Get medical help right away. Call your local emergency services (911 in the [place] Do not drive yourself to the hospital. This information is not intended to replace advice given to you by your health care provider. Make sure you discuss any questions you have with your health care provider. Document Released: 01/03/2008 Document Revised: 12/23/2015 Document Reviewed: 03/31/2015 Elsevier Interactive Patient Education  2017 [place] is a condition that results in unsteadiness when walking and standing, poor coordination of body movements, and difficulty maintaining an upright posture. It occurs due to a problem with the part of your brain that controls coordination and stability (cerebellar dysfunction). What are the causes? Ataxia can develop later in life (acquired ataxia) during your 20s to 30s, and even as late as into your 60s or beyond. Acquired ataxia may be caused by:  Changes in your nervous system (neurodegenerative).  Changes throughout your body (systemic disorders).  Excess exposure to: ? Medicines, such as phenytoin and lithium. ? Solvents. ? Abuse of alcohol (alcoholism).  Medical conditions, such as: ? Celiac  sprue. ? Hypothyroidism. ? Vitamin E deficiency. ? Structural brain abnormalities, such as tumors. ? Multiple sclerosis. ? Stroke. ? Head injury.  Ataxia may also be present early in life (non-acquired ataxia). There are two main types of non-acquired ataxia:  Cerebellar dysfunction present at birth (congenital).  Family inheritance (genetic heredity). Friedreich ataxia is the most common form of hereditary ataxia.  What are the signs or symptoms? The signs and symptoms of ataxia can vary depending on how severe the condition is that causes it. Signs and symptoms may include:  Unsteadiness.  Walking with a wide stance.  Tremor.  Poorly coordinated body movements.  Difficulty maintaining a straight (upright) posture.  Fatigue.  Changes in your speech.  Changes in your vision.  Difficulty swallowing.  Difficulty with writing.  Decreased mental status (dementia).  Muscle spasms.  How is this diagnosed? Ataxia is diagnosed by discussing your personal and family history and through a physical exam. You may also have additional tests such as:  MRI.  Genetic testing.  How is this treated? Treatment for ataxia may include treating or removing the underlying condition causing the ataxia. Surgery may be required if a structural abnormality in your brain is causing the ataxia. Otherwise, supportive treatments may be used to manage your symptoms. Follow these instructions at home: Monitor your ataxia for any changes. The following  actions may help any discomfort you are experiencing:  Do not drink alcohol.  Lie down right away if you become very unsteady, dizzy, nauseated, or feel like you are going to faint. Wait until all of these feelings pass before you get up again.  Get help right away if:  Your unsteadiness suddenly worsens.  You develop severe headaches, chest pain, or abdominal pain.  You have weakness or numbness on one side of your body.  You have  problems with your vision.  You feel confused.  You have difficulty speaking.  You have an irregular heartbeat or a very fast pulse. This information is not intended to replace advice given to you by your health care provider. Make sure you discuss any questions you have with your health care provider. Document Released: 02/11/2014 Document Revised: 12/23/2015 Document Reviewed: 10/17/2013 Elsevier Interactive Patient Education  [employer].

## 2017-12-31 NOTE — Progress Notes (Signed)
Assessment & Plan:  [name] Branch was seen today for hospitalization follow-up.  Diagnoses and all orders for this visit:  Near syncope -     Thyroid Panel With TSH -     Urinalysis Dipstick   Patient has been counseled on age-appropriate routine health concerns for screening and prevention. These are reviewed and up-to-date. Referrals have been placed accordingly. Immunizations are up-to-date or declined.    Subjective:   Chief Complaint  Patient presents with  . Hospitalization Follow-up   HPI [name] Branch 52 y.o. male presents to office today for follow up to near syncope. He is accompanied by his sister today who is providing much of his history. [name] Branch has a history of autism, schizophrenia, neurogenic bowel, sarcoidosis, and cardiomyopathy.  Near Syncope Seen in the ED on 12-30-2017 with reported slurred speech, fatigue, balance issue and trouble walking around. The patient had been started on Klonopin about a week before the onset of his symptoms. Head CT and MRI of the head were negative and electrolytes were normal. It was felt that patient's symptoms may have been related to his psychotropic medications. Patient's sister was instructed to speak to [name] psychiatrist. Today she reports he is still experiencing ataxia however I had him walk down the hallway several times and he was noted for no gait deficits.   Review of Systems  Constitutional: Negative for fever, malaise/fatigue and weight loss.  HENT: Negative.  Negative for nosebleeds.   Eyes: Negative.  Negative for blurred vision, double vision and photophobia.  Respiratory: Negative.  Negative for cough and shortness of breath.   Cardiovascular: Negative.  Negative for chest pain, palpitations and leg swelling.  Gastrointestinal: Negative for heartburn, nausea and vomiting.       Neurogenic bowel  Musculoskeletal: Negative.  Negative for myalgias.  Neurological: Negative.  Negative for dizziness, focal weakness, seizures  and headaches.  Psychiatric/Behavioral: Negative for suicidal ideas.       Autistic    Past Medical History:  Diagnosis Date  . ABSCESS, ANAL/RECTAL REGIONS 08/18/2006   Annotation: Fournier gangrene Qualifier: History of  By: Garnette Scheuermann MD, Arlee Muslim    . Cardiomyopathy   . Colonic inertia   . Constipation   . Glaucoma   . Hyperlipidemia   . Hypovolemic shock ([place])   . Mental retardation   . 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   Annotation: chronic with stercoral ulcer  Qualifier: Diagnosis of  By: Garnette Scheuermann MD, Arlee Muslim    . Obstruction of colon (HCC)    recurrent  . Sarcoidosis   . Schizophrenia ([place])   . 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 COLECTOMY N/A 09/25/2014   Procedure: OPEN ABDOMINAL COLECTOMY;  Surgeon: Michael Boston, MD;  Location: [facility];  Service: General;  Laterality: N/A;  . groin surgery     boil drained  . PROCTOSCOPY N/A 09/25/2014   Procedure: RIGID PROCTOSCOPY;  Surgeon: Michael Boston, MD;  Location: [facility];  Service: General;  Laterality: N/A;    Family History  Problem Relation Age of Onset  . Diabetes Mother   . Colon cancer Neg Hx     Social History Reviewed with no changes to be made today.   Outpatient Medications Prior to Visit  Medication Sig Dispense Refill  . cloZAPine (CLOZARIL) 25 MG tablet Take 125 mg by mouth 2 (two) times daily.     . fluvoxaMINE (LUVOX) 100 MG tablet Take 300 mg by mouth at bedtime.    [name] Branch  latanoprost (XALATAN) 0.005 % ophthalmic solution Place 1 drop into both eyes at bedtime.    . Multiple Vitamin (MULTIVITAMIN) capsule Take 1 capsule by mouth daily. 90 capsule 3  . polyethylene glycol powder (GLYCOLAX/MIRALAX) powder TAKE 1/2 CAPFUL BY MOUTH TWICE DAILY (Patient taking differently: Take 0.5 Containers by mouth 2 (two) times daily as needed for mild constipation. ) 255 g 11  . pravastatin (PRAVACHOL) 40 MG tablet TAKE ONE TABLET EACH DAY 90 tablet 3  . clonazePAM (KLONOPIN) 0.5 MG tablet  Take 0.5 mg by mouth 2 (two) times daily as needed for anxiety.     No facility-administered medications prior to visit.     No Known Allergies     Objective:    BP 131/84 (BP Location: Right Arm, Patient Position: Sitting, Cuff Size: Normal)   Pulse 97   Temp 97.8 F (36.6 C) (Oral)   Ht 6\' 2"  (1.88 m)   Wt 211 lb (95.7 kg)   SpO2 97%   BMI 27.09 kg/m  Wt Readings from Last 3 Encounters:  12/31/17 211 lb (95.7 kg)  12/11/17 196 lb 3.2 oz (89 kg)  11/23/17 198 lb (89.8 kg)    Physical Exam  Constitutional: He is oriented to person, place, and time. He appears well-developed and well-nourished. He is cooperative.  HENT:  Head: Normocephalic and atraumatic.  Eyes: EOM are normal.  Neck: Normal range of motion.  Cardiovascular: Normal rate, regular rhythm and normal heart sounds. Exam reveals no gallop and no friction rub.  No murmur heard. Pulmonary/Chest: Effort normal and breath sounds normal. No tachypnea. No respiratory distress. He has no decreased breath sounds. He has no wheezes. He has no rhonchi. He has no rales. He exhibits no tenderness.  Abdominal: Soft. Bowel sounds are normal.  Musculoskeletal: Normal range of motion. He exhibits no edema.  Neurological: He is alert and oriented to person, place, and time. He has normal strength. Coordination and gait normal.  Skin: Skin is warm and dry.  Psychiatric: He has a normal mood and affect.  Nursing note and vitals reviewed.      Patient has been counseled extensively about nutrition and exercise as well as the importance of adherence with medications and regular follow-up. The patient was given clear instructions to go to ER or return to medical center if symptoms don't improve, worsen or new problems develop. The patient verbalized understanding.   Follow-up: Return if symptoms worsen or fail to improve.   [name] Pounds, FNP-BC [facility] and [place], [place]     01/07/2018, 9:07 PM

## 2018-01-01 LAB — THYROID PANEL WITH TSH
FREE THYROXINE INDEX: 1.5 (ref 1.2–4.9)
T3 Uptake Ratio: 23 % — ABNORMAL LOW (ref 24–39)
T4 TOTAL: 6.7 ug/dL (ref 4.5–12.0)
TSH: 1.67 u[IU]/mL (ref 0.450–4.500)

## 2018-01-07 ENCOUNTER — Encounter: Payer: Self-pay | Admitting: Nurse Practitioner

## 2018-01-15 ENCOUNTER — Telehealth: Payer: Self-pay | Admitting: Internal Medicine

## 2018-01-15 NOTE — Telephone Encounter (Signed)
Patient sister called requesting to speak with the nurse regarding patient FMLA paperwork. Please f/u.

## 2018-01-17 ENCOUNTER — Telehealth: Payer: Self-pay | Admitting: Gastroenterology

## 2018-01-17 NOTE — Telephone Encounter (Signed)
Patient moved into the sister's home the first of May. Previous caregiver passed away. She calls today with concerns about the patient having daily diarrhea. She reports 6 loose bowel movements each day. He has been incontinent of stool in his sleep. She can hear his stomach "gurgling" constantly. She stopped the Miralax without improvement. She has given him Pepto Bismol without improvement.  Requests an appointment for evaluation. Scheduled for 01/22/18 at 3:00 pm.

## 2018-01-18 NOTE — Telephone Encounter (Signed)
Returned sister call and answer her questions that she had about fmla

## 2018-01-22 ENCOUNTER — Ambulatory Visit (INDEPENDENT_AMBULATORY_CARE_PROVIDER_SITE_OTHER)
Admission: RE | Admit: 2018-01-22 | Discharge: 2018-01-22 | Disposition: A | Discharge status: Home / Self Care | Payer: Medicare Other | Source: Ambulatory Visit | Attending: Nurse Practitioner | Admitting: Nurse Practitioner

## 2018-01-22 ENCOUNTER — Ambulatory Visit (INDEPENDENT_AMBULATORY_CARE_PROVIDER_SITE_OTHER): Payer: Medicare Other | Admitting: Nurse Practitioner

## 2018-01-22 ENCOUNTER — Encounter: Payer: Self-pay | Admitting: Nurse Practitioner

## 2018-01-22 ENCOUNTER — Other Ambulatory Visit: Payer: Medicare Other

## 2018-01-22 VITALS — BP 138/72 | HR 98 | Ht 74.0 in | Wt 210.0 lb

## 2018-01-22 DIAGNOSIS — R197 Diarrhea, unspecified: Secondary | ICD-10-CM

## 2018-01-22 NOTE — Patient Instructions (Signed)
If you are age 51 or older, your body mass index should be between 23-30. Your Body mass index is 26.96 kg/m. If this is out of the aforementioned range listed, please consider follow up with your Primary Care Provider.  If you are age 55 or younger, your body mass index should be between 19-25. Your Body mass index is 26.96 kg/m. If this is out of the aformentioned range listed, please consider follow up with your Primary Care Provider.   Your provider has requested that you go to the basement level for lab work and x-ray before leaving today. Press "B" on the elevator. The lab is located at the first door on the left as you exit the elevator.  Will call you with results.  Thank you for choosing me and [facility].   Tye Savoy, NP

## 2018-01-22 NOTE — Progress Notes (Signed)
IMPRESSION and PLAN:     1. 52 yo male with mental retardation here with caregiver for diarrhea. 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 Sounds like up until recently he was taking Miralax post colectomy to manage constipation. Now with 5-6 loose stools daily, some nocturnal incontinence per new caregiver who has taken care of him for last two months.  -Not sure why he has required MiraLAX post total colectomy but if this is the case then he is presenting with a definite bowel change to frequent loose stools.  -MiraLAX has been on hold since the new caregiver assumed care and stopped it because of the loose stool -Since the bowel history prior to 2 months ago cannot be confirmed (previous caregiver has passed) we cannot know for sure that constipation did not proceed the onset of this diarrhea /incontinence.  He could be having overflow diarrhea though I doubt it.  We will check a KUB today. -If KUB negative, check stool pathogen panel.  With total colectomy, C. difficile seems unlikely but will check anyway.  -will call Leanne Chang at [phone] or [phone] with results.   2. Colon cancer screening.  -He is s/p total colectomy but will still need a flexible sigmoidoscopy at some point to screen for rectal cancer.  -We will need to rule C. difficile infection before scheduling for lower endoscopy  HPI:    Chief Complaint: diarrhea  Patient is a 52 yo male with mental retardation/schizophrenia, anemia of chronic disease, sarcoidosis, and congestive heart failure.  He is status post total colectomy with ileorectal anastomosis 2016 for colonic inertia. We have seen him a couple of times since surgery for management of constipation. Bowel movements were documented as being normal on miralax when we saw him.  Patient's caregiver who has been with him for 20 years only passed away.  He is here with new caregiver/guardian.  She has been  having loose stool 5-6 times a day since being with a new caregiver.  He also has frequent episodes of nocturnal incontinence.  Caregivers have not seen any blood in the stool.  Patient does not complain of any abdominal pain.  His weight has been stable, appetite good.  No fevers . No recent antibiotics. No medication changres.   Review of systems:     No chest pain, no SOB, no urinary sx   Past Medical History:  Diagnosis Date  . ABSCESS, ANAL/RECTAL REGIONS 08/18/2006   Annotation: Fournier gangrene Qualifier: History of  By: Garnette Scheuermann MD, Arlee Muslim    . Cardiomyopathy   . Colonic inertia   . Constipation   . Glaucoma   . Hyperlipidemia   . Hypovolemic shock ([place])   . Mental retardation   . 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   Annotation: chronic with stercoral ulcer  Qualifier: Diagnosis of  By: Garnette Scheuermann MD, Arlee Muslim    . Obstruction of colon (HCC)    recurrent  . Sarcoidosis   . Schizophrenia ([place])   . 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    Patient's surgical history, family medical history, social history, medications and allergies were all reviewed in Epic   Creatinine clearance cannot be calculated (Patient's most recent lab result is older than the maximum 21 days allowed.)   Physical Exam:     BP 138/72   Pulse 98   Ht 6\' 2"  (1.88 m)   Wt 210 lb (95.3 kg)   BMI 26.96 kg/m   GENERAL:  Pleasant male in NAD PSYCH: : Cooperative, normal affect EENT:  conjunctiva pink, mucous membranes moist, neck supple without masses CARDIAC:  RRR, , no peripheral edema PULM: Normal respiratory effort, lungs CTA bilaterally, no wheezing ABDOMEN:  Nondistended, soft, nontender. No obvious masses, no hepatomegaly,  normal bowel sounds SKIN:  turgor, no lesions seen Musculoskeletal:  Normal muscle tone, normal strength NEURO: Alert and oriented x 3, no focal neurologic deficits   Tye Savoy , NP 01/22/2018, 3:33 PM

## 2018-01-23 ENCOUNTER — Encounter: Payer: Self-pay | Admitting: Nurse Practitioner

## 2018-01-25 NOTE — Progress Notes (Signed)
Reviewed and agree with documentation and assessment and plan. K. Veena Tesla Keeler , MD   

## 2018-01-28 ENCOUNTER — Telehealth: Payer: Self-pay | Admitting: Nurse Practitioner

## 2018-01-28 LAB — GASTROINTESTINAL PATHOGEN PANEL PCR
C. difficile Tox A/B, PCR: NOT DETECTED
CAMPYLOBACTER, PCR: NOT DETECTED
Cryptosporidium, PCR: NOT DETECTED
E COLI (ETEC) LT/ST, PCR: NOT DETECTED
E COLI 0157, PCR: NOT DETECTED
E coli (STEC) stx1/stx2, PCR: NOT DETECTED
GIARDIA LAMBLIA, PCR: NOT DETECTED
NOROVIRUS, PCR: NOT DETECTED
Rotavirus A, PCR: NOT DETECTED
SALMONELLA, PCR: NOT DETECTED
SHIGELLA, PCR: NOT DETECTED

## 2018-01-28 LAB — CLOSTRIDIUM DIFFICILE TOXIN B, QUALITATIVE, REAL-TIME PCR: Toxigenic C. Difficile by PCR: NOT DETECTED

## 2018-01-28 NOTE — Telephone Encounter (Signed)
Advised the stool cultures are negative.

## 2018-01-29 ENCOUNTER — Other Ambulatory Visit: Payer: Self-pay

## 2018-01-29 ENCOUNTER — Telehealth: Payer: Self-pay

## 2018-01-29 ENCOUNTER — Telehealth: Payer: Self-pay | Admitting: Internal Medicine

## 2018-01-29 MED ORDER — LOPERAMIDE HCL 2 MG PO TABS: 2.0000 mg | ORAL_TABLET | Freq: Every day | ORAL | 2 refills | Status: DC

## 2018-01-29 NOTE — Telephone Encounter (Signed)
Could you schedule pt an appointment with Dr. Wynetta Emery or walkin  Pt will need to be seen for frequent urination

## 2018-01-29 NOTE — Telephone Encounter (Signed)
Dr. Wynetta Emery would you be okay with me sending a rx to [facility] or would you rather like to see the pt first as an urgent care

## 2018-01-29 NOTE — Telephone Encounter (Signed)
Spoke with Tye Savoy, NP-C. She recommends one Imodium 2 mg PO at bedtime if having loose stool.  Called back to [place]. No answer. Left a message to advise of the plan. Rx transmitted to [facility].

## 2018-01-29 NOTE — Telephone Encounter (Signed)
[name] patient legal guardian called to let pt PCP know that patient needs an Rx for depends b/c he frequently goes to the restroom. Please f/u

## 2018-01-30 NOTE — Telephone Encounter (Signed)
I tried to call the patient but the mailbox was full.

## 2018-02-11 ENCOUNTER — Encounter: Payer: Self-pay | Admitting: Nurse Practitioner

## 2018-02-11 ENCOUNTER — Ambulatory Visit: Payer: Medicare Other | Attending: Nurse Practitioner | Admitting: Nurse Practitioner

## 2018-02-11 ENCOUNTER — Telehealth: Payer: Self-pay | Admitting: Nurse Practitioner

## 2018-02-11 VITALS — BP 126/81 | HR 91 | Temp 98.1°F | Ht 74.0 in | Wt 216.4 lb

## 2018-02-11 DIAGNOSIS — E785 Hyperlipidemia, unspecified: Secondary | ICD-10-CM | POA: Diagnosis not present

## 2018-02-11 DIAGNOSIS — F209 Schizophrenia, unspecified: Secondary | ICD-10-CM | POA: Diagnosis not present

## 2018-02-11 DIAGNOSIS — R152 Fecal urgency: Secondary | ICD-10-CM

## 2018-02-11 DIAGNOSIS — D869 Sarcoidosis, unspecified: Secondary | ICD-10-CM | POA: Insufficient documentation

## 2018-02-11 DIAGNOSIS — R159 Full incontinence of feces: Secondary | ICD-10-CM | POA: Diagnosis not present

## 2018-02-11 DIAGNOSIS — F79 Unspecified intellectual disabilities: Secondary | ICD-10-CM | POA: Diagnosis not present

## 2018-02-11 DIAGNOSIS — Z79899 Other long term (current) drug therapy: Secondary | ICD-10-CM | POA: Diagnosis not present

## 2018-02-11 DIAGNOSIS — R197 Diarrhea, unspecified: Secondary | ICD-10-CM | POA: Insufficient documentation

## 2018-02-11 MED ORDER — LOPERAMIDE HCL 2 MG PO TABS: 4.0000 mg | ORAL_TABLET | ORAL | 2 refills | Status: DC | PRN

## 2018-02-11 MED ORDER — MISC. DEVICES MISC
0 refills | Status: DC
Start: 2018-02-11 — End: 2020-07-12

## 2018-02-11 NOTE — Telephone Encounter (Signed)
Spoke with the patient, his new care giver and the sister of the patient. Walk in. Concerned about the night time stool incontinence. Reports 2 to 4 stools during the night. He is wearing pull ups but apparently overflows this. The evening meal time has been changed to 4 pm in an effort to decrease the night bowel movements. His meals are home cooked. The sister and care giver also asked about diet. He was seen by his PCP office today. The patient is to increase Imodium from 2 mg daily to 8 mg at hs.  He is scheduled now to come in to see Amy Esterwood, PA-C. Any suggestions?

## 2018-02-11 NOTE — Progress Notes (Signed)
Assessment & Plan:  [name] Branch was seen today for diarrhea.  Diagnoses and all orders for this visit:  Incontinence of feces with fecal urgency -     Misc. Devices MISC; Please provide patient with insurance approved incontinence garments/pads for incontinence of stool. -     loperamide (IMODIUM A-D) 2 MG tablet; Take 2 tablets (4 mg total) by mouth as needed. Take 4mg  at bedtime and 2mg  after each loose stool. Do not take more than 8 tablets in 24 hr    Patient has been counseled on age-appropriate routine health concerns for screening and prevention. These are reviewed and up-to-date. Referrals have been placed accordingly. Immunizations are up-to-date or declined.    Subjective:   Chief Complaint  Patient presents with  . Diarrhea    Pt's sister stated he is having diarhhea often at night.    HPI [name] Branch 52 y.o. male presents to office today accompanied by his sister and caregiver with complaints of increased nocturnal stools with incontinence. He has chronic loose stools per their report however the increased nocturnal frequency and incontenence are new.   Stool Incontinence Onset was several weeks ago. Diarrhea is occurring approximately several times per day. Patient describes diarrhea as loose. Diarrhea has been associated with abdominal pain described as aching.  Patient denies blood in stool, fever, recent travel.  Previous visits for diarrhea: yes, last seen several weeks ago by GI. Evaluation to date: stool studies. Treatment to date: Imodium 2mg  at night   Review of Systems  Constitutional: Negative for fever, malaise/fatigue and weight loss.  HENT: Negative for nosebleeds.   Eyes: Negative.  Negative for blurred vision, double vision and photophobia.  Respiratory: Negative.  Negative for cough and shortness of breath.   Cardiovascular: Negative.  Negative for chest pain, palpitations and leg swelling.  Gastrointestinal: Positive for abdominal pain and diarrhea.  Negative for blood in stool, constipation, heartburn, melena, nausea and vomiting.  Musculoskeletal: Negative.  Negative for myalgias.  Skin: Negative for rash.  Neurological: Negative.  Negative for dizziness, focal weakness, seizures and headaches.  Psychiatric/Behavioral: Negative.  Negative for suicidal ideas.    Past Medical History:  Diagnosis Date  . ABSCESS, ANAL/RECTAL REGIONS 08/18/2006   Annotation: Fournier gangrene Qualifier: History of  By: Garnette Scheuermann MD, Arlee Muslim    . Cardiomyopathy   . Colonic inertia   . Constipation   . Glaucoma   . Hyperlipidemia   . Hypovolemic shock ([place])   . Mental retardation   . 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   Annotation: chronic with stercoral ulcer  Qualifier: Diagnosis of  By: Garnette Scheuermann MD, Arlee Muslim    . Obstruction of colon (HCC)    recurrent  . Sarcoidosis   . Schizophrenia ([place])   . 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 COLECTOMY N/A 09/25/2014   Procedure: OPEN ABDOMINAL COLECTOMY;  Surgeon: Michael Boston, MD;  Location: [facility];  Service: General;  Laterality: N/A;  . groin surgery     boil drained  . PROCTOSCOPY N/A 09/25/2014   Procedure: RIGID PROCTOSCOPY;  Surgeon: Michael Boston, MD;  Location: [facility];  Service: General;  Laterality: N/A;    Family History  Problem Relation Age of Onset  . Diabetes Mother   . Colon cancer Neg Hx     Social History Reviewed with no changes to be made today.   Outpatient Medications Prior to Visit  Medication Sig Dispense Refill  . AMBULATORY NON FORMULARY MEDICATION  Place 1 drop into both eyes at bedtime. Medication Name: genteal lubricant eye D 15 ML    . clonazePAM (KLONOPIN) 0.5 MG tablet Take 0.5 mg by mouth 2 (two) times daily as needed for anxiety.    . fluvoxaMINE (LUVOX) 100 MG tablet Take 300 mg by mouth at bedtime.    Marland Kitchen latanoprost (XALATAN) 0.005 % ophthalmic solution Place 1 drop into both eyes at bedtime.    . Multiple Vitamin (MULTIVITAMIN)  capsule Take 1 capsule by mouth daily. 90 capsule 3  . pravastatin (PRAVACHOL) 40 MG tablet TAKE ONE TABLET EACH DAY 90 tablet 3  . loperamide (IMODIUM A-D) 2 MG tablet Take 1 tablet (2 mg total) by mouth at bedtime. For diarrhea 30 tablet 2  . cloZAPine (CLOZARIL) 25 MG tablet Take 125 mg by mouth 2 (two) times daily.     . polyethylene glycol powder (GLYCOLAX/MIRALAX) powder TAKE 1/2 CAPFUL BY MOUTH TWICE DAILY (Patient not taking: Reported on 02/11/2018) 255 g 11   No facility-administered medications prior to visit.     No Known Allergies     Objective:    BP 126/81 (BP Location: Left Arm, Patient Position: Sitting, Cuff Size: Large)   Pulse 91   Temp 98.1 F (36.7 C) (Oral)   Ht 6\' 2"  (1.88 m)   Wt 216 lb 6.4 oz (98.2 kg)   SpO2 97%   BMI 27.78 kg/m  Wt Readings from Last 3 Encounters:  02/11/18 216 lb 6.4 oz (98.2 kg)  01/22/18 210 lb (95.3 kg)  12/31/17 211 lb (95.7 kg)    Physical Exam  Constitutional: He is oriented to person, place, and time. He appears well-developed and well-nourished. He is cooperative.  HENT:  Head: Normocephalic and atraumatic.  Eyes: EOM are normal.  Neck: Normal range of motion.  Cardiovascular: Normal rate, regular rhythm and normal heart sounds. Exam reveals no gallop and no friction rub.  No murmur heard. Pulmonary/Chest: Effort normal and breath sounds normal. No tachypnea. No respiratory distress. He has no decreased breath sounds. He has no wheezes. He has no rhonchi. He has no rales. He exhibits no tenderness.  Abdominal: Bowel sounds are normal.  Musculoskeletal: Normal range of motion. He exhibits no edema.  Neurological: He is alert and oriented to person, place, and time. Coordination normal.  Skin: Skin is warm and dry.  Psychiatric: He has a normal mood and affect. His behavior is normal. Judgment and thought content normal.  Nursing note and vitals reviewed.      Patient has been counseled extensively about nutrition and  exercise as well as the importance of adherence with medications and regular follow-up. The patient was given clear instructions to go to ER or return to medical center if symptoms don't improve, worsen or new problems develop. The patient verbalized understanding.   Follow-up: No follow-ups on file.   [name], FNP-BC [facility] and [place], [place]   02/11/2018, 6:50 PM

## 2018-02-12 ENCOUNTER — Other Ambulatory Visit: Payer: Self-pay

## 2018-02-12 MED ORDER — CHOLESTYRAMINE 4 G PO PACK: 4.0000 g | PACK | Freq: Two times a day (BID) | ORAL | 3 refills | Status: DC

## 2018-02-12 NOTE — Telephone Encounter (Signed)
No answer. Left message on the voicemail to return my call.

## 2018-02-12 NOTE — Telephone Encounter (Signed)
Advised his care giver of the plan. Rx to [facility]. Discussed the lactose free diet. Keep the appointment that was scheduled yesterday .

## 2018-02-12 NOTE — Telephone Encounter (Signed)
GI pathogen panel negative. Please make sure patient is not continuing to get MiraLAX or any laxatives. Start cholestyramine 2 g twice daily Lactose-free diet Follow-up in office visit

## 2018-02-13 ENCOUNTER — Telehealth: Payer: Self-pay | Admitting: Internal Medicine

## 2018-02-13 NOTE — Telephone Encounter (Signed)
Please call patient about more days of for fmla paper work.

## 2018-02-13 NOTE — Telephone Encounter (Signed)
Pt stated she is needing more days on FMLA paperwork because pt is having diarrhea

## 2018-02-19 ENCOUNTER — Ambulatory Visit (INDEPENDENT_AMBULATORY_CARE_PROVIDER_SITE_OTHER)
Admission: RE | Admit: 2018-02-19 | Discharge: 2018-02-19 | Disposition: A | Discharge status: Home / Self Care | Payer: Medicare Other | Source: Ambulatory Visit | Attending: Physician Assistant | Admitting: Physician Assistant

## 2018-02-19 ENCOUNTER — Other Ambulatory Visit (INDEPENDENT_AMBULATORY_CARE_PROVIDER_SITE_OTHER): Payer: Medicare Other

## 2018-02-19 ENCOUNTER — Ambulatory Visit (INDEPENDENT_AMBULATORY_CARE_PROVIDER_SITE_OTHER): Payer: Medicare Other | Admitting: Physician Assistant

## 2018-02-19 ENCOUNTER — Encounter

## 2018-02-19 ENCOUNTER — Encounter: Payer: Self-pay | Admitting: Physician Assistant

## 2018-02-19 VITALS — BP 126/80 | HR 84 | Ht 74.0 in | Wt 217.6 lb

## 2018-02-19 DIAGNOSIS — Z9049 Acquired absence of other specified parts of digestive tract: Secondary | ICD-10-CM

## 2018-02-19 DIAGNOSIS — R197 Diarrhea, unspecified: Secondary | ICD-10-CM | POA: Diagnosis not present

## 2018-02-19 LAB — COMPREHENSIVE METABOLIC PANEL
ALK PHOS: 89 U/L (ref 39–117)
ALT: 27 U/L (ref 0–53)
AST: 23 U/L (ref 0–37)
Albumin: 4.3 g/dL (ref 3.5–5.2)
BILIRUBIN TOTAL: 0.3 mg/dL (ref 0.2–1.2)
BUN: 19 mg/dL (ref 6–23)
CALCIUM: 9.6 mg/dL (ref 8.4–10.5)
CO2: 32 mEq/L (ref 19–32)
Chloride: 103 mEq/L (ref 96–112)
Creatinine, Ser: 1.19 mg/dL (ref 0.40–1.50)
GFR: 82.72 mL/min (ref >60.00)
GLUCOSE: 96 mg/dL (ref 70–99)
Potassium: 4.2 mEq/L (ref 3.5–5.1)
Sodium: 140 mEq/L (ref 135–145)
TOTAL PROTEIN: 7.6 g/dL (ref 6.0–8.3)

## 2018-02-19 LAB — CBC WITH DIFFERENTIAL/PLATELET
BASOS ABS: 0 10*3/uL (ref 0.0–0.1)
Basophils Relative: 0.4 % (ref 0.0–3.0)
EOS ABS: 0 10*3/uL (ref 0.0–0.7)
Eosinophils Relative: 0.8 % (ref 0.0–5.0)
HCT: 42.7 % (ref 39.0–52.0)
Hemoglobin: 13.9 g/dL (ref 13.0–17.0)
LYMPHS ABS: 1.3 10*3/uL (ref 0.7–4.0)
Lymphocytes Relative: 26.9 % (ref 12.0–46.0)
MCHC: 32.5 g/dL (ref 30.0–36.0)
MCV: 92.3 fl (ref 78.0–100.0)
MONOS PCT: 13.2 % — AB (ref 3.0–12.0)
Monocytes Absolute: 0.6 10*3/uL (ref 0.1–1.0)
NEUTROS PCT: 58.7 % (ref 43.0–77.0)
Neutro Abs: 2.8 10*3/uL (ref 1.4–7.7)
Platelets: 163 10*3/uL (ref 150.0–400.0)
RBC: 4.63 Mil/uL (ref 4.22–5.81)
RDW: 14.9 % (ref 11.5–15.5)
WBC: 4.8 10*3/uL (ref 4.0–10.5)

## 2018-02-19 NOTE — Progress Notes (Signed)
Subjective:    Patient ID: [name] Branch, male    DOB: 1966/07/02, 52 y.o.   MRN: [id number]  HPI [name] Branch is a 52 year old African-American male, currently established with Dr. Silverio Decamp, and former patient of Dr. [name].  He was last seen in the office about a month ago by Tye Savoy with complaints of diarrhea. 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 He has history of sarcoidosis, schizophrenia, mental retardation and congestive heart failure. Apparently he had been cared for by a caregiver for several years who has fairly recently passed away and it is not clear how much difficulty he was having with his bowels over the past couple of years.  He had previously been given MiraLAX once daily even post colectomy to keep his bowels moving. Patient is unable to say how long he has been having problems with diarrhea but since he has had a new caregiver over the past several months he is having numerous stools per day.  They feel is having at least 10 bowel movements per day.  He also is incontinent of stool during the night.  Patient has no complaints of abdominal pain.  They have noticed very loud bowel sounds.  He has been eating without difficulty, no nausea or vomiting. MiraLAX had been stopped.  When he was seen in the office a month ago KUB was done which did not show any definite obstruction and no impaction. He then had GI pathogen panel and C. difficile done which were negative. On 02/11/2018 they called back and and he was started on Questran 4 g twice daily. Since starting the Questran his stool is thicker but he still going couple times per day still having incontinence at night.  They had been using Imodium but have not been giving this along with the [place]. His sister who is power of attorney is concerned about the amount of diarrhea, what is causing it, if this is going to be his normal state and wants to be sure he is  not dehydrated.  Review of Systems Pertinent positive and negative review of systems were noted in the above HPI section.  All other review of systems was otherwise negative.  Outpatient Encounter Medications as of 02/19/2018  Medication Sig  . AMBULATORY NON FORMULARY MEDICATION Place 1 drop into both eyes at bedtime. Medication Name: genteal lubricant eye D 15 ML  . cholestyramine (QUESTRAN) 4 g packet Take 1 packet (4 g total) by mouth 2 (two) times daily.  . clonazePAM (KLONOPIN) 0.5 MG tablet Take 0.5 mg by mouth 2 (two) times daily as needed for anxiety.  . cloZAPine (CLOZARIL) 25 MG tablet Take 125 mg by mouth 2 (two) times daily.   . fluvoxaMINE (LUVOX) 100 MG tablet Take 300 mg by mouth at bedtime.  Marland Kitchen latanoprost (XALATAN) 0.005 % ophthalmic solution Place 1 drop into both eyes at bedtime.  Marland Kitchen loperamide (IMODIUM A-D) 2 MG tablet Take 2 tablets (4 mg total) by mouth as needed. Take 33m at bedtime and 227mafter each loose stool. Do not take more than 8 tablets in 24 hr  . Misc. Devices MISC Please provide patient with insurance approved incontinence garments/pads for incontinence of stool.  . Multiple Vitamin (MULTIVITAMIN) capsule Take 1 capsule by mouth daily.  . polyethylene glycol powder (GLYCOLAX/MIRALAX) powder TAKE 1/2 CAPFUL BY MOUTH TWICE DAILY  . pravastatin (PRAVACHOL) 40 MG tablet TAKE ONE TABLET EACH DAY   No facility-administered encounter  medications on file as of 02/19/2018.    No Known Allergies Patient Active Problem List   Diagnosis Date Noted  . S/P total colectomy 07/20/2017  . Dizziness 10/12/2016  . Anemia of chronic disease 10/05/2014  . Colonic inertia s/p abdominal colectomy 09/25/2014 09/25/2014  . Hyperlipidemia 09/17/2008  . Glaucoma 10/10/2006  . Sarcoidosis 06/11/2006  . Schizophrenia, unspecified type ([place]) 06/11/2006  . Mental retardation 06/11/2006  . Chronic systolic CHF (congestive heart failure) ([place]) 06/11/2006  . 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     Social History   Socioeconomic History  . Marital status: Single    Spouse name: Not on file  . Number of children: 0  . Years of education: Not on file  . Highest education level: Not on file  Occupational History    Employer: DISABLED  Social Needs  . Financial resource strain: Not on file  . Food insecurity:    Worry: Not on file    Inability: Not on file  . Transportation needs:    Medical: Not on file    Non-medical: Not on file  Tobacco Use  . Smoking status: Never Smoker  . Smokeless tobacco: Never Used  Substance and Sexual Activity  . Alcohol use: No    Alcohol/week: 0.0 oz  . Drug use: No  . Sexual activity: Not on file    Comment:    Lifestyle  . Physical activity:    Days per week: Not on file    Minutes per session: Not on file  . Stress: Not on file  Relationships  . Social connections:    Talks on phone: Not on file    Gets together: Not on file    Attends religious service: Not on file    Active member of club or organization: Not on file    Attends meetings of clubs or organizations: Not on file    Relationship status: Not on file  . Intimate partner violence:    Fear of current or ex partner: Not on file    Emotionally abused: Not on file    Physically abused: Not on file    Forced sexual activity: Not on file  Other Topics Concern  . Not on file  Social History Narrative   Has a caretaker    Mr. Square's family history includes Diabetes in his mother.      Objective:    Vitals:   02/19/18 1438  BP: 126/80  Pulse: 84    Physical Exam; well-developed African-American male in no acute distress.  He is mentally challenged, but very pleasant.  He is accompanied by his sister who is power of attorney and  A caregiver.  Blood pressure 126/80 pulse 84, height 6 foot 2, weight 217, BMI of 27.9.  HEENT; nontraumatic normocephalic EOMI PERRLA sclera anicteric oropharynx benign.  Cardiovascular; regular rate and rhythm with S1-S2,  Pulmonary; clear bilaterally, Abdomen ;soft, bowel sounds are hyperactive there is no palpable mass or hepatosplenomegaly is basically nontender.  Rectal; exam tender to digital exam good sphincter tone no palpable lesion, no palpable impaction stool heme-negative.  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 He had not been seen here for a few years and is brought back in now with  new caregivers concerned about ongoing diarrhea with up to 10 bowel movements per day and nocturnal incontinence. No evidence of fecal impaction on exam today nor on recent KUB. Some minimal improvement with Questran twice daily.  It is not clear to me whether this is all secondary to post colectomy state or whether he has developed another issue.  Consider small bowel intestinal bacterial overgrowth.  Plan; 2 view abdominal films today CBC with differential and C met Continue Questran 4 g twice daily.  They are advised to give this at least 2 hours away from his other medications. Resume Imodium, advised to give 2 p.o. each morning and 2 p.o. each evening an hour or so before bedtime We will give him an empiric course of Xifaxan 550 mg p.o. 3 times daily x12 days.  They were given samples today to cover the entire course. Patient will have office follow-up with Dr. Silverio Decamp in 1 month.  Kean Gautreau Genia Harold PA-C 02/19/2018   Cc: Ladell Pier, MD

## 2018-02-19 NOTE — Patient Instructions (Addendum)
If you are age 51 or younger, your body mass index should be between 19-25. Your Body mass index is 27.94 kg/m. If this is out of the aformentioned range listed, please consider follow up with your Primary Care Provider.   Your provider has requested that you go to the basement level for lab work before leaving today. Press "B" on the elevator. The lab is located at the first door on the left as you exit the elevator. Also go to the Radiology department basement level.   We have given you samples of Xifaxan 550 mg- take 1 tablet by mouth 3 times daily until gone.   Continue  Cholestyramine ( Questran).  Take (Questran)  4 grams twice daily. ( Take 2 hours away from other medications).  Take Imodium 2 tablets every morning, take 2 Imodium tablets before bedtime.

## 2018-02-28 ENCOUNTER — Telehealth: Payer: Self-pay | Admitting: Gastroenterology

## 2018-02-28 NOTE — Telephone Encounter (Signed)
Spoke with the guardian [name] Branch. Briefly discussed the imaging results and the plan of treatment. Also assisted her in gaining access for the My Chart patient portal. [name] had to end the call. She is at work and she had to go to lunch.

## 2018-03-06 NOTE — Progress Notes (Signed)
Reviewed and agree with documentation and assessment and plan. K. Veena Jeshua Ransford , MD   

## 2018-03-14 ENCOUNTER — Telehealth: Payer: Self-pay

## 2018-03-14 NOTE — Telephone Encounter (Signed)
Contacted pt sister and left a detailed vm informing pt sister that the paperwork she dropped off there is no place for Dr. Wynetta Emery to fill out that it is needing her to fill out and if she is needing help filling it out she can contact the social security department and if she has any questions or concerns to give me a call

## 2018-03-20 ENCOUNTER — Telehealth: Payer: Self-pay | Admitting: Gastroenterology

## 2018-03-21 NOTE — Telephone Encounter (Signed)
Guardian notified of this recommendation.

## 2018-03-21 NOTE — Telephone Encounter (Signed)
Continue Cholestyramine BID and imodium as needed. Unfortunately given patient does'nt communicate, may not be able to help with the incontinence and lack of awareness with BM

## 2018-03-21 NOTE — Telephone Encounter (Signed)
Spoke with the guardian, [name]. The patient is using the Questran and Imodium. The stools are less watery now. Described as "firmer." The continuing issue is the incontinence that occurs. The patient seems unaware of this. He is now wearing pull-ups constantly. Patient does has mental disability, but he is able to communicate. The guardian is asking if the medications should be continued and if there is anything else they can try until the appointment for follow up. He is scheduled to return 04/22/18.

## 2018-03-25 ENCOUNTER — Ambulatory Visit: Payer: Medicare Other | Attending: Internal Medicine | Admitting: Internal Medicine

## 2018-03-25 ENCOUNTER — Encounter: Payer: Self-pay | Admitting: Internal Medicine

## 2018-03-25 VITALS — BP 114/76 | HR 104 | Temp 98.1°F | Resp 16 | Ht 75.0 in | Wt 208.2 lb

## 2018-03-25 DIAGNOSIS — R159 Full incontinence of feces: Secondary | ICD-10-CM | POA: Diagnosis not present

## 2018-03-25 DIAGNOSIS — Z833 Family history of diabetes mellitus: Secondary | ICD-10-CM | POA: Insufficient documentation

## 2018-03-25 DIAGNOSIS — F79 Unspecified intellectual disabilities: Secondary | ICD-10-CM | POA: Insufficient documentation

## 2018-03-25 DIAGNOSIS — Z0001 Encounter for general adult medical examination with abnormal findings: Secondary | ICD-10-CM | POA: Insufficient documentation

## 2018-03-25 DIAGNOSIS — Z9049 Acquired absence of other specified parts of digestive tract: Secondary | ICD-10-CM | POA: Insufficient documentation

## 2018-03-25 DIAGNOSIS — I5022 Chronic systolic (congestive) heart failure: Secondary | ICD-10-CM | POA: Insufficient documentation

## 2018-03-25 DIAGNOSIS — F209 Schizophrenia, unspecified: Secondary | ICD-10-CM | POA: Insufficient documentation

## 2018-03-25 DIAGNOSIS — E785 Hyperlipidemia, unspecified: Secondary | ICD-10-CM | POA: Diagnosis not present

## 2018-03-25 DIAGNOSIS — H409 Unspecified glaucoma: Secondary | ICD-10-CM | POA: Insufficient documentation

## 2018-03-25 DIAGNOSIS — Z125 Encounter for screening for malignant neoplasm of prostate: Secondary | ICD-10-CM

## 2018-03-25 DIAGNOSIS — Z23 Encounter for immunization: Secondary | ICD-10-CM | POA: Insufficient documentation

## 2018-03-25 DIAGNOSIS — D869 Sarcoidosis, unspecified: Secondary | ICD-10-CM | POA: Insufficient documentation

## 2018-03-25 DIAGNOSIS — Z79899 Other long term (current) drug therapy: Secondary | ICD-10-CM | POA: Diagnosis not present

## 2018-03-25 DIAGNOSIS — R152 Fecal urgency: Secondary | ICD-10-CM | POA: Insufficient documentation

## 2018-03-25 DIAGNOSIS — Z0000 Encounter for general adult medical examination without abnormal findings: Secondary | ICD-10-CM

## 2018-03-25 MED ORDER — PRAVASTATIN SODIUM 40 MG PO TABS: ORAL_TABLET | ORAL | 3 refills | Status: DC

## 2018-03-25 MED ORDER — LOPERAMIDE HCL 2 MG PO TABS: ORAL_TABLET | ORAL | 6 refills | Status: DC

## 2018-03-25 MED ORDER — MULTIVITAMINS PO CAPS: 1.0000 | ORAL_CAPSULE | Freq: Every day | ORAL | 3 refills | Status: AC

## 2018-03-25 MED ORDER — CHOLESTYRAMINE 4 G PO PACK
4.0000 g | PACK | Freq: Two times a day (BID) | ORAL | 6 refills | Status: DC
Start: 2018-03-25 — End: 2018-04-22

## 2018-03-25 NOTE — Progress Notes (Signed)
Patient ID: [name] Branch, male    DOB: May 30, 1966  MRN: [id number]  CC: Annual Exam   Subjective: [name] Branch is a 52 y.o. male who presents for physical/annual exam.  His sister, Keane Police, a nurse from [facility], Freda Munro, and Jacquiline Doe, his care giver at the group home where he is staying, are with him.   Form to be completed for physical for the group home.  All 3 contributed to history as pt unable to give hx His concerns today include:  52 year old male with history of mental retardation, sarcoidosis, schizophrenia, chronic systolic CHF(EF 08-65% 7846), HL, colonic inertia colectomy 2016 (ileorectal anastomosis) and glaucoma  Fecal Incontinence/diarrhea:  Since he last saw me, he was seen by our NP and referred to [facility] for diarrhea and fecal incontinence.  See by them x 2.  Stool cx and check for C.diff negative.  X-rays and P.E negative for impaction.  Started on Questran and Imodium.  Tried with course of Xifaxan for possible SIBO.  Still having 6-8 BMs a day.  Has to wear adult diapers all the time.  Has to be changed at least once at nights.  No incontinence of urine.  Questran has thicken the stools.  They are not sure how the Imodium is suppose to be given.  Sister is concern that he may become dehydrated.  They are wanting to know if this is the new norm for him.    -nurse Sue Lush had several questions about his medications.  We did a med reconciliation.  She request rxn for Pravastatin, Questrin and clarification on how Imodium should be dosed  Glaucoma:  Saw his eye doctor recently.  His care giver reports pt's appetite is good.  Sleeps well "when he sleeps."  Care giver takes him out so that he is not sitting around a lot.  Still goes to [facility] [employer]  [facility] day program from 8 a.m - 2 p.m during the week.    Mental health:  Followed by Gaet[facility] at [facility].  Sister reports that Clonazepam was d/c. Patient Active Problem List   Diagnosis Date Noted  . S/P  total colectomy 07/20/2017  . Dizziness 10/12/2016  . Anemia of chronic disease 10/05/2014  . Colonic inertia s/p abdominal colectomy 09/25/2014 09/25/2014  . Hyperlipidemia 09/17/2008  . Glaucoma 10/10/2006  . Sarcoidosis 06/11/2006  . Schizophrenia, unspecified type ([place]) 06/11/2006  . Mental retardation 06/11/2006  . Chronic systolic CHF (congestive heart failure) ([place]) 06/11/2006  . 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     Current Outpatient Medications on File Prior to Visit  Medication Sig Dispense Refill  . AMBULATORY NON FORMULARY MEDICATION Place 1 drop into both eyes at bedtime. Medication Name: genteal lubricant eye D 15 ML    . cloZAPine (CLOZARIL) 100 MG tablet Take 100 mg by mouth 2 (two) times daily.    . cloZAPine (CLOZARIL) 25 MG tablet Take 125 mg by mouth 2 (two) times daily.     . fluvoxaMINE (LUVOX) 100 MG tablet Take 300 mg by mouth at bedtime.    Marland Kitchen latanoprost (XALATAN) 0.005 % ophthalmic solution Place 1 drop into both eyes at bedtime.    . Misc. Devices MISC Please provide patient with insurance approved incontinence garments/pads for incontinence of stool. 1 each 0   No current facility-administered medications on file prior to visit.     No Known Allergies  Social History   Socioeconomic History  . Marital status: Single    Spouse name: Not  on file  . Number of children: 0  . Years of education: Not on file  . Highest education level: Not on file  Occupational History    Employer: DISABLED  Social Needs  . Financial resource strain: Not on file  . Food insecurity:    Worry: Not on file    Inability: Not on file  . Transportation needs:    Medical: Not on file    Non-medical: Not on file  Tobacco Use  . Smoking status: Never Smoker  . Smokeless tobacco: Never Used  Substance and Sexual Activity  . Alcohol use: No    Alcohol/week: 0.0 standard drinks  . Drug use: No  . Sexual activity: Not on file    Comment:    Lifestyle  . Physical activity:     Days per week: Not on file    Minutes per session: Not on file  . Stress: Not on file  Relationships  . Social connections:    Talks on phone: Not on file    Gets together: Not on file    Attends religious service: Not on file    Active member of club or organization: Not on file    Attends meetings of clubs or organizations: Not on file    Relationship status: Not on file  . Intimate partner violence:    Fear of current or ex partner: Not on file    Emotionally abused: Not on file    Physically abused: Not on file    Forced sexual activity: Not on file  Other Topics Concern  . Not on file  Social History Narrative   Has a caretaker    Family History  Problem Relation Age of Onset  . Diabetes Mother   . Colon cancer Neg Hx     Past Surgical History:  Procedure Laterality Date  . COLECTOMY N/A 09/25/2014   Procedure: OPEN ABDOMINAL COLECTOMY;  Surgeon: Michael Boston, MD;  Location: [facility];  Service: General;  Laterality: N/A;  . groin surgery     boil drained  . PROCTOSCOPY N/A 09/25/2014   Procedure: RIGID PROCTOSCOPY;  Surgeon: Michael Boston, MD;  Location: [facility];  Service: General;  Laterality: N/A;    ROS: Review of Systems  Constitutional: Negative for fever.       See history above  HENT: Negative for hearing loss.   Eyes:       Followed by ophthalmology for glaucoma.  Had visit this mth  Respiratory: Negative for cough and shortness of breath.   Gastrointestinal: Negative for abdominal distention, abdominal pain and blood in stool.  Psychiatric/Behavioral: Negative for agitation.    PHYSICAL EXAM: BP 114/76   Pulse (!) 104   Temp 98.1 F (36.7 C) (Oral)   Resp 16   Ht 6\' 3"  (1.905 m)   Wt 208 lb 3.2 oz (94.4 kg)   SpO2 97%   BMI 26.02 kg/m   Wt Readings from Last 3 Encounters:  03/25/18 208 lb 3.2 oz (94.4 kg)  02/19/18 217 lb 9.6 oz (98.7 kg)  02/11/18 216 lb 6.4 oz (98.2 kg)    Physical Exam General appearance - pt in NAD Mental status - pt  shakes head yes/no but other than that, he does not communicate much Eyes - pupils equal and reactive, extraocular eye movements intact Ears - small amounts of wax at opening to each canal Nose - normal and patent, no erythema, discharge or polyps Mouth - mucous membranes moist, pharynx normal without lesions  Neck - supple, no significant adenopathy Lymphatics - no palpable lymphadenopathy, no hepatosplenomegaly Chest - clear to auscultation, no wheezes, rales or rhonchi, symmetric air entry Heart - normal rate, regular rhythm, normal S1, S2, no murmurs, rubs, clicks or gallops Abdomen - soft, nontender, nondistended, no masses or organomegaly Rectal - not done.  Done by GI last mth Musculoskeletal - no signs of muscle wasting in hands, arms and lower legs Extremities - no edema in LEs   Lab Results  Component Value Date   WBC 4.8 02/19/2018   HGB 13.9 02/19/2018   HCT 42.7 02/19/2018   MCV 92.3 02/19/2018   PLT 163.0 02/19/2018     Chemistry      Component Value Date/Time   NA 140 02/19/2018 1544   NA 138 11/23/2017 1108   K 4.2 02/19/2018 1544   CL 103 02/19/2018 1544   CO2 32 02/19/2018 1544   BUN 19 02/19/2018 1544   BUN 9 11/23/2017 1108   CREATININE 1.19 02/19/2018 1544   CREATININE 1.01 09/02/2012 1022      Component Value Date/Time   CALCIUM 9.6 02/19/2018 1544   ALKPHOS 89 02/19/2018 1544   AST 23 02/19/2018 1544   ALT 27 02/19/2018 1544   BILITOT 0.3 02/19/2018 1544   BILITOT 0.6 11/23/2017 1108      ASSESSMENT AND PLAN: 1. Annual physical exam -med reconciliation completed.  RFs given on rxn requested including MV.  -recommended that they have him drink several glasses water daily to keep hydrated.  Can also give Gatorade at times. -flu shot given -will leave it up to GI to decide about need for flex sig to look at rectum as mentioned in their first note -Ms. Wynetta Emery was requesting that form for her agency be completed today. I told her that due to time  restrains, I will have to complete later and have my CMA call her when ready to pick up.  She requested that it be left at front desk  2. Incontinence of feces, unspecified fecal incontinence type Per GI note, I recommended that Imodium be given as 2 tabs PO Q a.m and 2 at bedtime.   Message sent to GI inquiring whether small bowel bx need.  3. Need for influenza vaccination - Flu Vaccine QUAD 6+ mos PF IM (Fluarix Quad PF)  4. Schizophrenia, unspecified type ([place]) Receiving mental health services  5. Hyperlipidemia, unspecified hyperlipidemia type - pravastatin (PRAVACHOL) 40 MG tablet; TAKE ONE TABLET EACH DAY  Dispense: 90 tablet; Refill: 3 - Lipid panel - Multiple Vitamin (MULTIVITAMIN) capsule; Take 1 capsule by mouth daily.  Dispense: 90 capsule; Refill: 3  6. Incontinence of feces with fecal urgency - cholestyramine (QUESTRAN) 4 g packet; Take 1 packet (4 g total) by mouth 2 (two) times daily.  Dispense: 60 each; Refill: 6 - loperamide (IMODIUM A-D) 2 MG tablet; 2 tabs PO Q a.m and 2 tabs q p.m before bedtime  Dispense: 120 tablet; Refill: 6  7. Prostate cancer screening Sister asked about rectal exam and prostate cancer screen,  Advised of conflicting recommendations of screening vs not screening.  In any event, rectal exam not needed.  I told her we can check PSA but it was discovered that his insurance does not cover.   I gave her the waiver for to sign if she wants to have it done and pay out of pocket.  She decided to hold off -she also wanted to know why full blood test not being done.  I  told her that pt had some routine blood test over past 2 mths through GI and no need to repeat at this time  At end of all of this, sister also wanted to talk about her FMLA form that was completed a few months ago, faxed to her employer.  She never picked up form from front desk.    Patient was given the opportunity to ask questions.  Patient verbalized understanding of the plan and was able  to repeat key elements of the plan.   Orders Placed This Encounter  Procedures  . Flu Vaccine QUAD 6+ mos PF IM (Fluarix Quad PF)  . Lipid panel     Requested Prescriptions   Signed Prescriptions Disp Refills  . pravastatin (PRAVACHOL) 40 MG tablet 90 tablet 3    Sig: TAKE ONE TABLET EACH DAY  . cholestyramine (QUESTRAN) 4 g packet 60 each 6    Sig: Take 1 packet (4 g total) by mouth 2 (two) times daily.  Marland Kitchen loperamide (IMODIUM A-D) 2 MG tablet 120 tablet 6    Sig: 2 tabs PO Q a.m and 2 tabs q p.m before bedtime  . Multiple Vitamin (MULTIVITAMIN) capsule 90 capsule 3    Sig: Take 1 capsule by mouth daily.    Return in about 4 months (around 07/25/2018).  Karle Plumber, MD, FACP

## 2018-03-25 NOTE — Patient Instructions (Signed)

## 2018-03-26 ENCOUNTER — Telehealth: Payer: Self-pay

## 2018-03-26 LAB — LIPID PANEL
CHOL/HDL RATIO: 2.3 ratio (ref 0.0–5.0)
Cholesterol, Total: 133 mg/dL (ref 100–199)
HDL: 59 mg/dL (ref >39)
LDL Calculated: 61 mg/dL (ref 0–99)
TRIGLYCERIDES: 64 mg/dL (ref 0–149)
VLDL Cholesterol Cal: 13 mg/dL (ref 5–40)

## 2018-03-26 NOTE — Telephone Encounter (Signed)
Faxed form over to [employer] original is up front

## 2018-03-28 ENCOUNTER — Telehealth: Payer: Self-pay

## 2018-03-28 ENCOUNTER — Telehealth: Payer: Self-pay | Admitting: Internal Medicine

## 2018-03-28 NOTE — Telephone Encounter (Signed)
-----   Message from Mauri Pole, MD sent at 03/27/2018  4:27 PM EDT ----- Based on the phone conversation it appears he is have more episodes of fecal incontinence with semi formed to formed stool and stools are not liquid any more. We will bring him back to the office and reassess. Its difficult to get good history in this case. We can consider endoscopic evaluation to exclude enteropathy but usually is low yield .  Beth, can you please bring him in for office visit soon and will consider flex sig with biopsies.  Thanks VN ----- Message ----- From: Ladell Pier, MD Sent: 03/27/2018   1:12 PM EDT To: Alfredia Ferguson, PA-C, Mauri Pole, MD  I am the PCP for this pt.  I saw him in f/u this week.  He is still have incontinence/diarrhea issue.  Is there any indication for small bowel biopsy here?  Also, I see the first PA who saw him in June mentioned possible flex sig for rectal cancer screening.  Any thoughts?

## 2018-03-28 NOTE — Telephone Encounter (Signed)
Spoke with the patient's guardian, [name]. Agreed to Nurse visit tomorrow 03/29/18 2pm for instructions for flex sig on 04/08/18 at 11am. Keep follow up appointment as scheduled.

## 2018-03-29 ENCOUNTER — Ambulatory Visit (AMBULATORY_SURGERY_CENTER): Payer: Self-pay | Admitting: *Deleted

## 2018-03-29 VITALS — Ht 74.0 in | Wt 214.0 lb

## 2018-03-29 DIAGNOSIS — R197 Diarrhea, unspecified: Secondary | ICD-10-CM

## 2018-03-29 DIAGNOSIS — R159 Full incontinence of feces: Secondary | ICD-10-CM | POA: Insufficient documentation

## 2018-03-29 DIAGNOSIS — R6889 Other general symptoms and signs: Secondary | ICD-10-CM | POA: Insufficient documentation

## 2018-03-29 NOTE — Progress Notes (Signed)
No egg or soy allergy known to patient  No issues with past sedation with any surgeries  or procedures, no intubation problems  No diet pills per patient No home 02 use per patient  No blood thinners per patient  Pt denies issues with constipation  No A fib or A flutter  EMMI video sent to pt's e mail --  [place] giver says he cannot hold his bowels and has to use diapers cause cannot hold his bowels - he has to have total care now with bathing and dressing and cleaning himself  POA, sister [name] Branch not in [place] today- [place] and she will come by Tuesday 9-3 and sign the consent and the pre acknowledgement sheet - papers to [employer] per [place] -

## 2018-04-03 ENCOUNTER — Other Ambulatory Visit: Payer: Medicare Other | Admitting: Gastroenterology

## 2018-04-08 ENCOUNTER — Encounter: Payer: Self-pay | Admitting: Gastroenterology

## 2018-04-08 ENCOUNTER — Other Ambulatory Visit: Payer: Self-pay

## 2018-04-08 ENCOUNTER — Ambulatory Visit (AMBULATORY_SURGERY_CENTER): Payer: Medicare Other | Admitting: Gastroenterology

## 2018-04-08 VITALS — BP 116/74 | HR 85 | Temp 97.3°F | Resp 14 | Ht 74.0 in | Wt 214.0 lb

## 2018-04-08 DIAGNOSIS — D128 Benign neoplasm of rectum: Secondary | ICD-10-CM

## 2018-04-08 DIAGNOSIS — R197 Diarrhea, unspecified: Secondary | ICD-10-CM | POA: Diagnosis not present

## 2018-04-08 MED ORDER — SODIUM CHLORIDE 0.9 % IV SOLN: 500.0000 mL | Freq: Once | INTRAVENOUS | Status: DC

## 2018-04-08 MED ORDER — BENEFIBER PO POWD: 15.0000 mL | Freq: Three times a day (TID) | ORAL | 11 refills | Status: DC

## 2018-04-08 NOTE — Patient Instructions (Signed)
YOU HAD AN ENDOSCOPIC PROCEDURE TODAY AT THE [facility]:   Refer to the procedure report that was given to you for any specific questions about what was found during the examination.  If the procedure report does not answer your questions, please call your gastroenterologist to clarify.  If you requested that your care partner not be given the details of your procedure findings, then the procedure report has been included in a sealed envelope for you to review at your convenience later.  YOU SHOULD EXPECT: Some feelings of bloating in the abdomen. Passage of more gas than usual.  Walking can help get rid of the air that was put into your GI tract during the procedure and reduce the bloating. If you had a lower endoscopy (such as a colonoscopy or flexible sigmoidoscopy) you may notice spotting of blood in your stool or on the toilet paper. If you underwent a bowel prep for your procedure, you may not have a normal bowel movement for a few days.  Please Note:  You might notice some irritation and congestion in your nose or some drainage.  This is from the oxygen used during your procedure.  There is no need for concern and it should clear up in a day or so.  SYMPTOMS TO REPORT IMMEDIATELY:   Following lower endoscopy (colonoscopy or flexible sigmoidoscopy):  Excessive amounts of blood in the stool  Significant tenderness or worsening of abdominal pains  Swelling of the abdomen that is new, acute  Fever of 100F or higher  For urgent or emergent issues, a gastroenterologist can be reached at any hour by calling [phone].   DIET:  We do recommend a small meal at first, but then you may proceed to your regular diet.  Drink plenty of fluids but you should avoid alcoholic beverages for 24 hours.  ACTIVITY:  You should plan to take it easy for the rest of today and you should NOT DRIVE or use heavy machinery until tomorrow (because of the sedation medicines used during the test).     FOLLOW UP: Our staff will call the number listed on your records the next business day following your procedure to check on you and address any questions or concerns that you may have regarding the information given to you following your procedure. If we do not reach you, we will leave a message.  However, if you are feeling well and you are not experiencing any problems, there is no need to return our call.  We will assume that you have returned to your regular daily activities without incident.  If any biopsies were taken you will be contacted by phone or by letter within the next 1-3 weeks.  Please call us at [phone] if you have not heard about the biopsies in 3 weeks.    SIGNATURES/CONFIDENTIALITY: You and/or your care partner have signed paperwork which will be entered into your electronic medical record.  These signatures attest to the fact that that the information above on your After Visit Summary has been reviewed and is understood.  Full responsibility of the confidentiality of this discharge information lies with you and/or your care-partner. 

## 2018-04-08 NOTE — Progress Notes (Signed)
Report given to PACU, vss 

## 2018-04-08 NOTE — Op Note (Signed)
[facility] Patient Name: Nahun Kronberg Procedure Date: 04/08/2018 12:07 PM MRN: [id number] Endoscopist: Mauri Pole , MD Age: 52 Referring MD:  Date of Birth: November 03, 1965 Gender: Male Account #: [id number] Procedure:                Flexible Sigmoidoscopy Indications:              Clinically significant diarrhea of unexplained                            origin Medicines:                Monitored Anesthesia Care Procedure:                Pre-Anesthesia Assessment:                           - Prior to the procedure, a History and Physical                            was performed, and patient medications and                            allergies were reviewed. The patient's tolerance of                            previous anesthesia was also reviewed. The risks                            and benefits of the procedure and the sedation                            options and risks were discussed with the patient.                            All questions were answered, and informed consent                            was obtained. Prior Anticoagulants: The patient has                            taken no previous anticoagulant or antiplatelet                            agents. ASA Grade Assessment: III - A patient with                            severe systemic disease. After reviewing the risks                            and benefits, the patient was deemed in                            satisfactory condition to undergo the procedure.  After obtaining informed consent, the scope was                            passed under direct vision. The Colonoscope was                            introduced through the anus and advanced to the the                            rectum. The flexible sigmoidoscopy was performed                            with difficulty due to poor bowel prep with stool                            present. Successful completion of the  procedure was                            aided by lavage. The patient tolerated the                            procedure well. The quality of the bowel                            preparation was poor. Scope In: Scope Out: Findings:                 A large amount of stool was found in the rectum,                            interfering with visualization. Lavage of the area                            was performed using copious amounts of sterile                            water, resulting in incomplete clearance with                            continued poor visualization.                           Normal mucosa was found in the visualized rectum,                            Biopsies for histology were taken with a cold                            forceps from the rectum for evaluation of                            microscopic colitis.                           There  was evidence of a prior surgical anastomosis                            in the rectum. This was characterized by friable                            mucosa. The anastomosis could not be traversed due                            to stricture. Complications:            No immediate complications. Estimated Blood Loss:     Estimated blood loss was minimal. Impression:               - Preparation of the colon was poor.                           - No specimens collected. Recommendation:           - Patient has a contact number available for                            emergencies. The signs and symptoms of potential                            delayed complications were discussed with the                            patient. Return to normal activities tomorrow.                            Written discharge instructions were provided to the                            patient.                           - Mechanical soft diet.                           - Continue present medications.                           - Continue Choletyramine                            - Avoid Imodium                           - Benfiber 1 tablespoon TID with meals                           - Await pathology results.                           - No high dose aspirin, ibuprofen, naproxen, or  other non-steroidal anti-inflammatory drugs. Mauri Pole, MD 04/08/2018 12:33:29 PM This report has been signed electronically.

## 2018-04-08 NOTE — Progress Notes (Signed)
Called to room to assist during endoscopic procedure.  Patient ID and intended procedure confirmed with present staff. Received instructions for my participation in the procedure from the performing physician.  

## 2018-04-09 ENCOUNTER — Telehealth: Payer: Self-pay | Admitting: *Deleted

## 2018-04-09 NOTE — Telephone Encounter (Signed)
  Follow up Call-  Call back number 04/08/2018  Post procedure Call Back phone  # [phone]  Permission to leave phone message Yes  Some recent data might be hidden     Patient questions:  Do you have a fever, pain , or abdominal swelling? No. Pain Score  0 *  Have you tolerated food without any problems? Yes.    Have you been able to return to your normal activities? Yes.    Do you have any questions about your discharge instructions: Diet   No. Medications  No. Follow up visit  No.  Do you have questions or concerns about your Care? No.  Actions: * If pain score is 4 or above: No action needed, pain <4.

## 2018-04-11 ENCOUNTER — Other Ambulatory Visit: Payer: Self-pay

## 2018-04-11 DIAGNOSIS — R197 Diarrhea, unspecified: Secondary | ICD-10-CM

## 2018-04-11 MED ORDER — TINIDAZOLE 500 MG PO TABS: 500.0000 mg | ORAL_TABLET | Freq: Every day | ORAL | 0 refills | Status: AC

## 2018-04-17 ENCOUNTER — Telehealth: Payer: Self-pay | Admitting: Gastroenterology

## 2018-04-17 NOTE — Telephone Encounter (Signed)
No answer

## 2018-04-18 NOTE — Telephone Encounter (Signed)
Spoke with the guardian [name]. She reports the patient has had "some slowing down of his bowels." She feels there has been some response to the antibiotics. Appointment will be kept as scheduled.

## 2018-04-22 ENCOUNTER — Encounter: Payer: Self-pay | Admitting: Gastroenterology

## 2018-04-22 ENCOUNTER — Ambulatory Visit (INDEPENDENT_AMBULATORY_CARE_PROVIDER_SITE_OTHER): Payer: Medicare Other | Admitting: Gastroenterology

## 2018-04-22 VITALS — BP 118/74 | HR 92 | Ht 74.0 in | Wt 215.8 lb

## 2018-04-22 DIAGNOSIS — R197 Diarrhea, unspecified: Secondary | ICD-10-CM

## 2018-04-22 DIAGNOSIS — R159 Full incontinence of feces: Secondary | ICD-10-CM | POA: Diagnosis not present

## 2018-04-22 DIAGNOSIS — Z9049 Acquired absence of other specified parts of digestive tract: Secondary | ICD-10-CM

## 2018-04-22 DIAGNOSIS — R152 Fecal urgency: Secondary | ICD-10-CM

## 2018-04-22 MED ORDER — CHOLESTYRAMINE 4 G PO PACK: 4.0000 g | PACK | Freq: Three times a day (TID) | ORAL | 3 refills | Status: DC

## 2018-04-22 NOTE — Patient Instructions (Addendum)
Increase Questran to 4 mg three times a day with meals  Continue Benefiber 1 tablespoon three times a day with meals  Follow up in 6 months   If you are age 51 or older, your body mass index should be between 23-30. Your Body mass index is 27.71 kg/m. If this is out of the aforementioned range listed, please consider follow up with your Primary Care Provider.  If you are age 72 or younger, your body mass index should be between 19-25. Your Body mass index is 27.71 kg/m. If this is out of the aformentioned range listed, please consider follow up with your Primary Care Provider.    Thank you for choosing [facility]  Karleen Hampshire Nandigam,MD

## 2018-04-23 NOTE — Telephone Encounter (Signed)
Entered in error

## 2018-04-29 ENCOUNTER — Encounter: Payer: Self-pay | Admitting: Gastroenterology

## 2018-04-29 NOTE — Progress Notes (Signed)
[name] Branch    [id number]    04-19-1966  Primary Care Physician:Johnson, Dalbert Batman, MD  Referring Physician: Ladell Pier, MD [place], [place] [address]  Chief complaint: Diarrhea, fecal incontinence  HPI: 52 year old African-American male with history of schizophrenia, mental retardation, congestive heart failure and sarcoidosis is here for follow-up visit accompanied by his assigned caregiver. Flexible sigmoidoscopy April 08, 2018 was suboptimal due to poor prep, large amount of stool was noted in the rectum.  After extensive lavage and suction visualized some area of the rectal mucosa and noted erythema and friable mucosa at ileocolonic anastomosis.  Biopsies showed mild focal active colitis, nonspecific with no chronic changes.  Likely secondary to infection are chronic stool impaction.  He was likely having overflow diarrhea. Adding Benefiber has improved diarrhea and is currently having 4-5 semi-formed bowel movements daily.  No longer taking Imodium.  He is getting cholestyramine 4 g twice daily mixed in juice.  He drinks almond milk and is avoiding dairy products.  He does like drinks 2-3 bottles a day of soda also drinks fruit juice.  Any nausea, vomiting, abdominal pain, loss of appetite, weight loss or blood in stool.  Is completing the course of tinidazole   Outpatient Encounter Medications as of 04/22/2018  Medication Sig  . AMBULATORY NON FORMULARY MEDICATION Place 1 drop into both eyes at bedtime. Medication Name: genteal lubricant eye D 15 ML  . cholestyramine (QUESTRAN) 4 g packet Take 1 packet (4 g total) by mouth 3 (three) times daily. With meals  . cloZAPine (CLOZARIL) 100 MG tablet Take 100 mg by mouth 2 (two) times daily.  . cloZAPine (CLOZARIL) 25 MG tablet Take 125 mg by mouth 2 (two) times daily.   . fluvoxaMINE (LUVOX) 100 MG tablet Take 300 mg by mouth at bedtime.  Marland Kitchen latanoprost (XALATAN) 0.005 % ophthalmic solution Place  1 drop into both eyes at bedtime.  . Misc. Devices MISC Please provide patient with insurance approved incontinence garments/pads for incontinence of stool.  . Multiple Vitamin (MULTIVITAMIN) capsule Take 1 capsule by mouth daily.  . pravastatin (PRAVACHOL) 40 MG tablet TAKE ONE TABLET EACH DAY  . Wheat Dextrin (BENEFIBER) POWD Take 15 mLs by mouth 3 (three) times daily with meals. Benefiber 1 tablespoon tid with meals  . [DISCONTINUED] cholestyramine (QUESTRAN) 4 g packet Take 1 packet (4 g total) by mouth 2 (two) times daily.  . [DISCONTINUED] loperamide (IMODIUM A-D) 2 MG tablet 2 tabs PO Q a.m and 2 tabs q p.m before bedtime (Patient not taking: Reported on 04/22/2018)   No facility-administered encounter medications on file as of 04/22/2018.     Allergies as of 04/22/2018  . (No Known Allergies)    Past Medical History:  Diagnosis Date  . ABSCESS, ANAL/RECTAL REGIONS 08/18/2006   Annotation: Fournier gangrene Qualifier: History of  By: Garnette Scheuermann MD, Arlee Muslim    . Cardiomyopathy   . Colonic inertia   . Constipation   . Glaucoma   . Hyperlipidemia   . Hypovolemic shock ([place])   . Incontinent of feces   . Mental retardation   . 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   Annotation: chronic with stercoral ulcer  Qualifier: Diagnosis of  By: Garnette Scheuermann MD, Arlee Muslim    . Obstruction of colon ([facility])    recurrent  . Sarcoidosis   . Schizophrenia ([place])   . 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 Total self-care deficit    per  caregiver he needs to have help bathing and cleaning self and needs help with all daily needs now     Past Surgical History:  Procedure Laterality Date  . COLECTOMY N/A 09/25/2014   Procedure: OPEN ABDOMINAL COLECTOMY;  Surgeon: Michael Boston, MD;  Location: [facility];  Service: General;  Laterality: N/A;  . COLONOSCOPY  2007  . groin surgery     boil drained  . PROCTOSCOPY N/A 09/25/2014   Procedure: RIGID PROCTOSCOPY;  Surgeon: Michael Boston, MD;  Location: [facility];  Service: General;   Laterality: N/A;    Family History  Problem Relation Age of Onset  . Diabetes Mother   . Colon cancer Neg Hx   . Colon polyps Neg Hx   . Esophageal cancer Neg Hx   . Rectal cancer Neg Hx   . Stomach cancer Neg Hx     Social History   Socioeconomic History  . Marital status: Single    Spouse name: Not on file  . Number of children: 0  . Years of education: Not on file  . Highest education level: Not on file  Occupational History    Employer: DISABLED  Social Needs  . Financial resource strain: Not on file  . Food insecurity:    Worry: Not on file    Inability: Not on file  . Transportation needs:    Medical: Not on file    Non-medical: Not on file  Tobacco Use  . Smoking status: Never Smoker  . Smokeless tobacco: Never Used  Substance and Sexual Activity  . Alcohol use: No    Alcohol/week: 0.0 standard drinks  . Drug use: No  . Sexual activity: Not on file    Comment:    Lifestyle  . Physical activity:    Days per week: Not on file    Minutes per session: Not on file  . Stress: Not on file  Relationships  . Social connections:    Talks on phone: Not on file    Gets together: Not on file    Attends religious service: Not on file    Active member of club or organization: Not on file    Attends meetings of clubs or organizations: Not on file    Relationship status: Not on file  . Intimate partner violence:    Fear of current or ex partner: Not on file    Emotionally abused: Not on file    Physically abused: Not on file    Forced sexual activity: Not on file  Other Topics Concern  . Not on file  Social History Narrative   Has a caretaker      Review of systems: Review of Systems  Constitutional: Negative for fever and chills.  HENT: Negative.   Eyes: Negative for blurred vision.  Respiratory: Negative for cough, shortness of breath and wheezing.   Cardiovascular: Negative for chest pain and palpitations.  Gastrointestinal: as per HPI Genitourinary:  Negative for dysuria, urgency, frequency and hematuria.  Musculoskeletal: Negative for myalgias, back pain and joint pain.  Skin: Negative for itching and rash.  Neurological: Negative for dizziness, tremors, focal weakness, seizures and loss of consciousness.  Endo/Heme/Allergies: Positive for seasonal allergies.  Psychiatric/Behavioral: Negative for depression, suicidal ideas and hallucinations.  All other systems reviewed and are negative.   Physical Exam: Vitals:   04/22/18 1440  BP: 118/74  Pulse: 92   Body mass index is 27.71 kg/m. Gen:      No acute distress HEENT:  EOMI, sclera  anicteric Neck:     No masses; no thyromegaly Lungs:    Clear to auscultation bilaterally; normal respiratory effort CV:         Regular rate and rhythm; no murmurs Abd:      + bowel sounds; soft, non-tender; no palpable masses, no distension Ext:    No edema; adequate peripheral perfusion Skin:      Warm and dry; no rash Neuro: alert and oriented x 3 Psych: normal mood and affect  Data Reviewed:  Reviewed labs, radiology imaging, old records and pertinent past GI work up   Assessment and Plan/Recommendations: 52 year old male with history of sarcoidosis, schizophrenia, mental retardation, CHF, history of chronic constipation status post total colectomy with ileorectal anastomosis in 2016. Patient is cared by assigned caregivers  Is currently having 4-5 semi-formed bowel movements.  He does not have sensation of fecal urgency and sometimes has fecal incontinence and soiling of underpants.  He usually does not alert if he is going to have a bowel movement and very rarely a bowel movement. Reassured caregiver 4-5 semi-formed bowel movement after total colectomy is considered normal. Continue Benefiber 3 times daily Increase cholestyramine to 4 g 3 times daily advised to avoid it 2 hours after taking other medications and not to take any medications within 4 hours after taking cholestyramine Avoid  any laxatives or Imodium. Flexible sigmoidoscopy September 2019 suboptimal due to poor bowel prep with large amount of stool in the rectum.  Will need repeat exam for colorectal cancer screening.  Will discuss at next visit. Return in 6 months or sooner if needed   K. Denzil Magnuson , MD [phone]    CC: Ladell Pier, MD

## 2018-06-06 ENCOUNTER — Ambulatory Visit: Payer: Medicare Other | Attending: Internal Medicine | Admitting: Internal Medicine

## 2018-06-06 ENCOUNTER — Encounter: Payer: Self-pay | Admitting: Internal Medicine

## 2018-06-06 VITALS — BP 124/79 | HR 73 | Temp 98.1°F | Resp 16 | Wt 222.0 lb

## 2018-06-06 DIAGNOSIS — H409 Unspecified glaucoma: Secondary | ICD-10-CM | POA: Insufficient documentation

## 2018-06-06 DIAGNOSIS — Z79899 Other long term (current) drug therapy: Secondary | ICD-10-CM | POA: Diagnosis not present

## 2018-06-06 DIAGNOSIS — E785 Hyperlipidemia, unspecified: Secondary | ICD-10-CM | POA: Diagnosis not present

## 2018-06-06 DIAGNOSIS — K592 Neurogenic bowel, not elsewhere classified: Secondary | ICD-10-CM | POA: Diagnosis not present

## 2018-06-06 DIAGNOSIS — L602 Onychogryphosis: Secondary | ICD-10-CM | POA: Diagnosis not present

## 2018-06-06 DIAGNOSIS — R159 Full incontinence of feces: Secondary | ICD-10-CM | POA: Insufficient documentation

## 2018-06-06 DIAGNOSIS — F79 Unspecified intellectual disabilities: Secondary | ICD-10-CM | POA: Insufficient documentation

## 2018-06-06 DIAGNOSIS — D869 Sarcoidosis, unspecified: Secondary | ICD-10-CM | POA: Diagnosis not present

## 2018-06-06 DIAGNOSIS — I5022 Chronic systolic (congestive) heart failure: Secondary | ICD-10-CM | POA: Diagnosis not present

## 2018-06-06 DIAGNOSIS — L84 Corns and callosities: Secondary | ICD-10-CM | POA: Insufficient documentation

## 2018-06-06 DIAGNOSIS — F209 Schizophrenia, unspecified: Secondary | ICD-10-CM | POA: Insufficient documentation

## 2018-06-06 DIAGNOSIS — E663 Overweight: Secondary | ICD-10-CM | POA: Insufficient documentation

## 2018-06-06 DIAGNOSIS — Z6828 Body mass index (BMI) 28.0-28.9, adult: Secondary | ICD-10-CM | POA: Insufficient documentation

## 2018-06-06 NOTE — Progress Notes (Signed)
Patient ID: [name] Branch, male    DOB: Aug 24, 1965  MRN: [id number]  CC: Referral (podiatrist )   Subjective: [name] Branch is a 52 y.o. male who presents for chronic disease management. Jacquiline Doe from [facility],  Ms. Tamala Julian Control and instrumentation engineer for [employer]) and sister are with him.  His concerns today include:  52 year old male with history of mental retardation, sarcoidosis, schizophrenia, chronic systolic CHF(EF 78-29% 5621), HL, colonic inertia colectomy 2016 (ileorectal anastomosis) and glaucoma  Sister is requesting referral to podiatry to have calluses shaved.  He has calluses on both feet.  She takes him to the nail salon to get his nails clipped.    He has seen GI in follow-up since last visit with me.  He had sigmoidoscopy but prep was inadequate.  Patient placed on Benefiber and Questran increased to TID from BID.  Roby reports that stool consistency thicker but still incontinent.  However stool frequency has decreased. Heath Lark reports that patient has good appetite.  They had to make some changes in his diet to cut back on too much sugar.  She states that patient sometimes has behavioral changes because he does not get his way in terms of what he wants to eat.  I pointed out that in May of this year he was 196 pounds and today he is 222 pounds.  Saw eye doctor 2 mths ago.  No changes in eyedrop. Patient Active Problem List   Diagnosis Date Noted  . Incontinent of feces   . Total self-care deficit   . S/P total colectomy 07/20/2017  . Dizziness 10/12/2016  . Anemia of chronic disease 10/05/2014  . Colonic inertia s/p abdominal colectomy 09/25/2014 09/25/2014  . Hyperlipidemia 09/17/2008  . Glaucoma 10/10/2006  . Sarcoidosis 06/11/2006  . Schizophrenia, unspecified type ([place]) 06/11/2006  . Mental retardation 06/11/2006  . Chronic systolic CHF (congestive heart failure) ([place]) 06/11/2006  . 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     Current Outpatient Medications on File  Prior to Visit  Medication Sig Dispense Refill  . AMBULATORY NON FORMULARY MEDICATION Place 1 drop into both eyes at bedtime. Medication Name: genteal lubricant eye D 15 ML    . cholestyramine (QUESTRAN) 4 g packet Take 1 packet (4 g total) by mouth 3 (three) times daily. With meals 90 each 3  . cloZAPine (CLOZARIL) 100 MG tablet Take 100 mg by mouth 2 (two) times daily.    . cloZAPine (CLOZARIL) 25 MG tablet Take 125 mg by mouth 2 (two) times daily.     . fluvoxaMINE (LUVOX) 100 MG tablet Take 300 mg by mouth at bedtime.    Marland Kitchen latanoprost (XALATAN) 0.005 % ophthalmic solution Place 1 drop into both eyes at bedtime.    . Misc. Devices MISC Please provide patient with insurance approved incontinence garments/pads for incontinence of stool. 1 each 0  . Multiple Vitamin (MULTIVITAMIN) capsule Take 1 capsule by mouth daily. 90 capsule 3  . pravastatin (PRAVACHOL) 40 MG tablet TAKE ONE TABLET EACH DAY 90 tablet 3  . Wheat Dextrin (BENEFIBER) POWD Take 15 mLs by mouth 3 (three) times daily with meals. Benefiber 1 tablespoon tid with meals 1 Can 11   No current facility-administered medications on file prior to visit.     No Known Allergies  Social History   Socioeconomic History  . Marital status: Single    Spouse name: Not on file  . Number of children: 0  . Years of education: Not on file  . Highest education level: Not  on file  Occupational History    Employer: DISABLED  Social Needs  . Financial resource strain: Not on file  . Food insecurity:    Worry: Not on file    Inability: Not on file  . Transportation needs:    Medical: Not on file    Non-medical: Not on file  Tobacco Use  . Smoking status: Never Smoker  . Smokeless tobacco: Never Used  Substance and Sexual Activity  . Alcohol use: No    Alcohol/week: 0.0 standard drinks  . Drug use: No  . Sexual activity: Not on file    Comment:    Lifestyle  . Physical activity:    Days per week: Not on file    Minutes per  session: Not on file  . Stress: Not on file  Relationships  . Social connections:    Talks on phone: Not on file    Gets together: Not on file    Attends religious service: Not on file    Active member of club or organization: Not on file    Attends meetings of clubs or organizations: Not on file    Relationship status: Not on file  . Intimate partner violence:    Fear of current or ex partner: Not on file    Emotionally abused: Not on file    Physically abused: Not on file    Forced sexual activity: Not on file  Other Topics Concern  . Not on file  Social History Narrative   Has a caretaker    Family History  Problem Relation Age of Onset  . Diabetes Mother   . Colon cancer Neg Hx   . Colon polyps Neg Hx   . Esophageal cancer Neg Hx   . Rectal cancer Neg Hx   . Stomach cancer Neg Hx     Past Surgical History:  Procedure Laterality Date  . COLECTOMY N/A 09/25/2014   Procedure: OPEN ABDOMINAL COLECTOMY;  Surgeon: Michael Boston, MD;  Location: [facility];  Service: General;  Laterality: N/A;  . COLONOSCOPY  2007  . groin surgery     boil drained  . PROCTOSCOPY N/A 09/25/2014   Procedure: RIGID PROCTOSCOPY;  Surgeon: Michael Boston, MD;  Location: [facility];  Service: General;  Laterality: N/A;    ROS: Review of Systems Negative except as above PHYSICAL EXAM: BP 124/79   Pulse 73   Temp 98.1 F (36.7 C) (Oral)   Resp 16   Wt 222 lb (100.7 kg)   SpO2 96%   BMI 28.50 kg/m   Wt Readings from Last 3 Encounters:  06/06/18 222 lb (100.7 kg)  04/22/18 215 lb 12.8 oz (97.9 kg)  04/08/18 214 lb (97.1 kg)    Physical Exam  General appearance - alert, well appearing, and in no distress.  Clothing clean Mental status -patient follows commands appropriately  neck - supple, no significant adenopathy Chest - clear to auscultation, no wheezes, rales or rhonchi, symmetric air entry Heart - normal rate, regular rhythm, normal S1, S2, no murmurs, rubs, clicks or gallops Extremities  -no lower extremity edema. Feet: Patient has 2 small callus on the medial aspect of the right foot and one on the medial aspect of the left foot.  All toenails are overgrown  Depression screen [facility] 2/9 06/06/2018 03/25/2018 12/31/2017  Decreased Interest 0 0 3  Down, Depressed, Hopeless 0 0 2  PHQ - 2 Score 0 0 5  Altered sleeping - - 2  Tired, decreased energy - -  3  Change in appetite - - 0  Feeling bad or failure about yourself  - - 0  Trouble concentrating - - 2  Moving slowly or fidgety/restless - - 3  Suicidal thoughts - - 0  PHQ-9 Score - - 15    ASSESSMENT AND PLAN:  1. Pre-ulcerative corn or callous - Ambulatory referral to Podiatry  2. Overgrown toenails - Ambulatory referral to Podiatry  3. Over weight Encourage use caregiver to make sure that portion sizes are appropriate and not too large.  She states that patient loves sodas.  I recommend drinking more water instead but is a compromise she can give diet soda instead of regular soda.  4. Incontinence of feces, unspecified fecal incontinence type Decrease stool frequency reported.  Sister has left an FMLA form to be completed again.  She states that on average she misses about 1-2 days a month to go with him to various appointments.  Patient was given the opportunity to ask questions.  Patient verbalized understanding of the plan and was able to repeat key elements of the plan.   Orders Placed This Encounter  Procedures  . Ambulatory referral to Podiatry     Requested Prescriptions    No prescriptions requested or ordered in this encounter    No follow-ups on file.  Karle Plumber, MD, FACP

## 2018-06-13 ENCOUNTER — Telehealth: Payer: Self-pay | Admitting: Gastroenterology

## 2018-06-13 NOTE — Telephone Encounter (Signed)
This was faxed back and scanned in on 06/03/2018. Resent order form and faxed to the number provided along with the 11/4 copy as well today

## 2018-06-17 ENCOUNTER — Encounter: Payer: Self-pay | Admitting: Podiatry

## 2018-06-17 ENCOUNTER — Ambulatory Visit (INDEPENDENT_AMBULATORY_CARE_PROVIDER_SITE_OTHER): Payer: Medicare Other | Admitting: Podiatry

## 2018-06-17 VITALS — BP 151/91

## 2018-06-17 DIAGNOSIS — L84 Corns and callosities: Secondary | ICD-10-CM

## 2018-06-17 DIAGNOSIS — M79676 Pain in unspecified toe(s): Secondary | ICD-10-CM

## 2018-06-17 DIAGNOSIS — B351 Tinea unguium: Secondary | ICD-10-CM

## 2018-06-17 NOTE — Patient Instructions (Signed)
Corns and Calluses Corns are small areas of thickened skin that occur on the top, sides, or tip of a toe. They contain a cone-shaped core with a point that can press on a nerve below. This causes pain. Calluses are areas of thickened skin that can occur anywhere on the body including hands, fingers, palms, soles of the feet, and heels.Calluses are usually larger than corns. What are the causes? Corns and calluses are caused by rubbing (friction) or pressure, such as from shoes that are too tight or do not fit properly. What increases the risk? Corns are more likely to develop in people who have toe deformities, such as hammer toes. Since calluses can occur with friction to any area of the skin, calluses are more likely to develop in people who:  Work with their hands.  Wear shoes that fit poorly, shoes that are too tight, or shoes that are high-heeled.  Have toes deformities.  What are the signs or symptoms? Symptoms of a corn or callus include:  A hard growth on the skin.  Pain or tenderness under the skin.  Redness and swelling.  Increased discomfort while wearing tight-fitting shoes.  How is this diagnosed? Corns and calluses may be diagnosed with a medical history and physical exam. How is this treated? Corns and calluses may be treated with:  Removing the cause of the friction or pressure. This may include: ? Changing your shoes. ? Wearing shoe inserts (orthotics) or other protective layers in your shoes, such as a corn pad. ? Wearing gloves.  Medicines to help soften skin in the hardened, thickened areas.  Reducing the size of the corn or callus by removing the dead layers of skin.  Antibiotic medicines to treat infection.  Surgery, if a toe deformity is the cause.  Follow these instructions at home:  Take medicines only as directed by your health care provider.  If you were prescribed an antibiotic, finish all of it even if you start to feel better.  Wear  shoes that fit well. Avoid wearing high-heeled shoes and shoes that are too tight or too loose.  Wear any padding, protective layers, gloves, or orthotics as directed by your health care provider.  Soak your hands or feet and then use a file or pumice stone to soften your corn or callus. Do this as directed by your health care provider.  Check your corn or callus every day for signs of infection. Watch for: ? Redness, swelling, or pain. ? Fluid, blood, or pus. Contact a health care provider if:  Your symptoms do not improve with treatment.  You have increased redness, swelling, or pain at the site of your corn or callus.  You have fluid, blood, or pus coming from your corn or callus.  You have new symptoms. This information is not intended to replace advice given to you by your health care provider. Make sure you discuss any questions you have with your health care provider. Document Released: 04/22/2004 Document Revised: 02/04/2016 Document Reviewed: 07/13/2014 Elsevier Interactive Patient Education  2018 Elsevier Inc.  

## 2018-06-18 ENCOUNTER — Encounter: Payer: Self-pay | Admitting: Podiatry

## 2018-06-18 NOTE — Progress Notes (Signed)
Subjective: [name] Branch is a 52 yo [name] who presents to clinic today accompanied by his sister and his caregiver. He resides in an ALF.  The reason for today's visit is painful calluses of both feet. Duration is longstanding. Onset, gradual. Course is worsening with ambulation. Aggravating factor is weightbearing with or without shoe gear. He has tried nothing to treat it.  Also, complaint of elongated, thickenend toenails of both feet. Duration greater than two months. Has had prior treatment at nail salons.  Medical History   08/18/2006 ABSCESS, ANAL/RECTAL REGIONS   06/11/2006 Neurogenic bowel   Date Unknown Cardiomyopathy  Date Unknown Colonic inertia  Date Unknown Constipation  Date Unknown Glaucoma  Date Unknown Hyperlipidemia  Date Unknown Hypovolemic shock ([place])  Date Unknown Incontinent of feces  Date Unknown Mental retardation  Date Unknown Obstruction of colon ([place])   Date Unknown Sarcoidosis  Date Unknown Schizophrenia [facility])  Date Unknown Splenomegaly   Date Unknown Total self-care deficit    Medical History   08/18/2006 ABSCESS, ANAL/RECTAL REGIONS   06/11/2006 Neurogenic bowel   Date Unknown Cardiomyopathy  Date Unknown Colonic inertia  Date Unknown Constipation  Date Unknown Glaucoma  Date Unknown Hyperlipidemia  Date Unknown Hypovolemic shock ([place])  Date Unknown Incontinent of feces  Date Unknown Mental retardation  Date Unknown Obstruction of colon ([place])   Date Unknown Sarcoidosis  Date Unknown Schizophrenia ([place])  Date Unknown Splenomegaly   Date Unknown Total self-care deficit    Surgical History   09/25/2014 Colectomy (N/A)   09/25/2014 Proctoscopy (N/A)   2007 Colonoscopy  Date Unknown groin surgery [Other]    Medications    AMBULATORY NON FORMULARY MEDICATION    cholestyramine (QUESTRAN) 4 g packet    cloZAPine (CLOZARIL) 100 MG tablet    cloZAPine (CLOZARIL) 25 MG tablet    fluvoxaMINE (LUVOX) 100 MG tablet    latanoprost (XALATAN) 0.005 %  ophthalmic solution    Misc. Devices MISC    Multiple Vitamin (MULTIVITAMIN) capsule    pravastatin (PRAVACHOL) 40 MG tablet    Wheat Dextrin (BENEFIBER) POWD    Immunizations/Injections   Influenza Split2/09/2012, 09/04/2011   Influenza Whole1/07/2011, 10/01/2009, 05/27/2007   Influenza,inj,Quad PF,6+ Mos8/26/2019, 07/19/2016, 04/21/2015, . . .   Tdap4/26/2019, 11/23/2017    Allergies      No Known Allergies   Tobacco History    Smoking Status  Never Smoker  Smokeless Tobacco Status  Never Used   Family History   Mother (Deceased) Diabetes         Father (Deceased)            Neg Hx Colon cancer    Colon polyps    Esophageal cancer    Rectal cancer    Stomach cancer     Review of systems: Constitutional: Denies chills fatigue fever sweats +weight change Eyes: Denies diplopia glare, denies light sensitivity, +glaucoma Ears nose mouth throat: Denies vertigo denies bloody nose rhinitis denies cold sores and snoring Cardiovascular: Denies chest pain tightness Respiratory: Denies difficulty breathing, denies congestion Gastrointestinal: +fecal incontinence, denies abdominal pain, diarrhea, nausea, vomiting Genitourinary: Denies nocturia, pain on urination, blood in urine Musculoskeletal: Denies cramping Skin: +changes in toenails, denies color change dryness, itchy skin, mole changes, or rash  Neurological: Denies fainting, denies seizure, denies change in speech. Endocrine: Denies dry mouth, denies flushing, denies heat intolerance, denies cold intolerance, denies excessive thirst, denies polyuria, denies nocturia Hematological: Denies easy bleeding, excessive bleeding, easy bruising, denies enlarged lymph nodes Allergy/immunological: Denies hives denies frequent infections  Objective: Vitals:   06/17/18 1544  BP: (!) 151/91   Vascular Examination: Capillary refill time <3 seconds x 10 digits Dorsalis pedis pulses 2/4 b/l Posterior tibial pulses present 2/4 b/l Sparse  digital hair x 10 digits Skin temperature warm to warm b/l  Dermatological Examination: Skin with normal turgor, texture and tone  Toenails 1-5 elongated, thickened, dystrophic with subungual debris b/l  Hyperkeratotic lesions noted plantarmedial aspect right hallux, right medial 1st metatarsal head and medial aspect posterior right heel, left medial 1st met head  Musculoskeletal: Muscle strength 5/5 to all LE muscle groups  Neurological: Sensation intact with 10 gram monofilament. Vibratory sensation intact.  Assessment: 1. Painful onychomycosis 1-5 b/l 2. Painful calluses b/l feet   Plan: 1. Discussed diagnoses on today. Literature dispensed. 2. Toenails 1-5 b/l were debrided in length and girth without complication 3. Calluses pared b/l for a total of 4 lesions:plantarmedial aspect right hallux, right medial 1st metatarsal head and medial aspect posterior right heel, left medial 1st met head 3.  Recommended change in shoe gear to [employer]. 4. Patient to continue soft, supportive shoe gear 5. Patient to report any pedal injuries to medical professional immediately. 6. Follow up 3 months. Patient/POA to call should there be a concern in the interim.

## 2018-07-01 ENCOUNTER — Telehealth: Payer: Self-pay | Admitting: Internal Medicine

## 2018-07-01 NOTE — Telephone Encounter (Signed)
Patient's guardian called to check on the status of the FMLA documents she faxed over. Please follow up.

## 2018-07-02 NOTE — Telephone Encounter (Signed)
Could you please clarify?

## 2018-07-03 NOTE — Telephone Encounter (Signed)
Attempted to call guardian to get clarification on what FMLA documents she faxed however, I was unable to reach her and left message to call back.

## 2018-07-09 ENCOUNTER — Telehealth: Payer: Self-pay | Admitting: Internal Medicine

## 2018-07-09 NOTE — Telephone Encounter (Signed)
Patients  caregiver came and picked up FMLA paperwork, however she would like a call back from a Nurse about a second fax she sent over. please follow up [phone] p

## 2018-07-10 NOTE — Telephone Encounter (Signed)
Caregiver [name] states she faxed 2 additional pages for FMLA that need to be filled out signed. Please check for the forms and update caregiver [phone]. cand [phone].

## 2018-07-11 ENCOUNTER — Telehealth: Payer: Self-pay

## 2018-07-11 NOTE — Telephone Encounter (Signed)
Contacted [name] and went over FMLA papers with her and made the corrections. Will fax form over with corrections

## 2018-07-11 NOTE — Telephone Encounter (Signed)
Contacted [name] and spoke with her regarding her FMLA and made the corrections and Dr. Wynetta Emery initial them. Informed pt that I faxed FMLA and if she has any questions to give me a call

## 2018-09-16 ENCOUNTER — Encounter: Payer: Self-pay | Admitting: Podiatry

## 2018-09-16 ENCOUNTER — Ambulatory Visit (INDEPENDENT_AMBULATORY_CARE_PROVIDER_SITE_OTHER): Payer: Medicare Other | Admitting: Podiatry

## 2018-09-16 DIAGNOSIS — M79676 Pain in unspecified toe(s): Secondary | ICD-10-CM | POA: Diagnosis not present

## 2018-09-16 DIAGNOSIS — L84 Corns and callosities: Secondary | ICD-10-CM | POA: Diagnosis not present

## 2018-09-16 DIAGNOSIS — B351 Tinea unguium: Secondary | ICD-10-CM

## 2018-09-16 NOTE — Patient Instructions (Signed)
Onychomycosis/Fungal Toenails  WHAT IS IT? An infection that lies within the keratin of your nail plate that is caused by a fungus.  WHY ME? Fungal infections affect all ages, sexes, races, and creeds.  There may be many factors that predispose you to a fungal infection such as age, coexisting medical conditions such as diabetes, or an autoimmune disease; stress, medications, fatigue, genetics, etc.  Bottom line: fungus thrives in a warm, moist environment and your shoes offer such a location.  IS IT CONTAGIOUS? Theoretically, yes.  You do not want to share shoes, nail clippers or files with someone who has fungal toenails.  Walking around barefoot in the same room or sleeping in the same bed is unlikely to transfer the organism.  It is important to realize, however, that fungus can spread easily from one nail to the next on the same foot.  HOW DO WE TREAT THIS?  There are several ways to treat this condition.  Treatment may depend on many factors such as age, medications, pregnancy, liver and kidney conditions, etc.  It is best to ask your doctor which options are available to you.  1. No treatment.   Unlike many other medical concerns, you can live with this condition.  However for many people this can be a painful condition and may lead to ingrown toenails or a bacterial infection.  It is recommended that you keep the nails cut short to help reduce the amount of fungal nail. 2. Topical treatment.  These range from herbal remedies to prescription strength nail lacquers.  About 40-50% effective, topicals require twice daily application for approximately 9 to 12 months or until an entirely new nail has grown out.  The most effective topicals are medical grade medications available through physicians offices. 3. Oral antifungal medications.  With an 80-90% cure rate, the most common oral medication requires 3 to 4 months of therapy and stays in your system for a year as the new nail grows out.  Oral  antifungal medications do require blood work to make sure it is a safe drug for you.  A liver function panel will be performed prior to starting the medication and after the first month of treatment.  It is important to have the blood work performed to avoid any harmful side effects.  In general, this medication safe but blood work is required. 4. Laser Therapy.  This treatment is performed by applying a specialized laser to the affected nail plate.  This therapy is noninvasive, fast, and non-painful.  It is not covered by insurance and is therefore, out of pocket.  The results have been very good with a 80-95% cure rate.  [facility] is the only practice in the area to offer this therapy. Permanent Nail Avulsion.  Removing the entire nail so that a new nail will not grow back.Corns and Calluses Corns are small areas of thickened skin that occur on the top, sides, or tip of a toe. They contain a cone-shaped core with a point that can press on a nerve below. This causes pain.  Calluses are areas of thickened skin that can occur anywhere on the body, including the hands, fingers, palms, soles of the feet, and heels. Calluses are usually larger than corns. What are the causes? Corns and calluses are caused by rubbing (friction) or pressure, such as from shoes that are too tight or do not fit properly. What increases the risk? Corns are more likely to develop in people who have misshapen   toes (toe deformities), such as hammer toes. Calluses can occur with friction to any area of the skin. They are more likely to develop in people who:  Work with their hands.  Wear shoes that fit poorly, are too tight, or are high-heeled.  Have toe deformities. What are the signs or symptoms? Symptoms of a corn or callus include:  A hard growth on the skin.  Pain or tenderness under the skin.  Redness and swelling.  Increased discomfort while wearing tight-fitting shoes, if your feet are affected. If a  corn or callus becomes infected, symptoms may include:  Redness and swelling that gets worse.  Pain.  Fluid, blood, or pus draining from the corn or callus. How is this diagnosed? Corns and calluses may be diagnosed based on your symptoms, your medical history, and a physical exam. How is this treated? Treatment for corns and calluses may include:  Removing the cause of the friction or pressure. This may involve: ? Changing your shoes. ? Wearing shoe inserts (orthotics) or other protective layers in your shoes, such as a corn pad. ? Wearing gloves.  Applying medicine to the skin (topical medicine) to help soften skin in the hardened, thickened areas.  Removing layers of dead skin with a file to reduce the size of the corn or callus.  Removing the corn or callus with a scalpel or laser.  Taking antibiotic medicines, if your corn or callus is infected.  Having surgery, if a toe deformity is the cause. Follow these instructions at home:   Take over-the-counter and prescription medicines only as told by your health care provider.  If you were prescribed an antibiotic, take it as told by your health care provider. Do not stop taking it even if your condition starts to improve.  Wear shoes that fit well. Avoid wearing high-heeled shoes and shoes that are too tight or too loose.  Wear any padding, protective layers, gloves, or orthotics as told by your health care provider.  Soak your hands or feet and then use a file or pumice stone to soften your corn or callus. Do this as told by your health care provider.  Check your corn or callus every day for symptoms of infection. Contact a health care provider if you:  Notice that your symptoms do not improve with treatment.  Have redness or swelling that gets worse.  Notice that your corn or callus becomes painful.  Have fluid, blood, or pus coming from your corn or callus.  Have new symptoms. Summary  Corns are small areas of  thickened skin that occur on the top, sides, or tip of a toe.  Calluses are areas of thickened skin that can occur anywhere on the body, including the hands, fingers, palms, and soles of the feet. Calluses are usually larger than corns.  Corns and calluses are caused by rubbing (friction) or pressure, such as from shoes that are too tight or do not fit properly.  Treatment may include wearing any padding, protective layers, gloves, or orthotics as told by your health care provider. This information is not intended to replace advice given to you by your health care provider. Make sure you discuss any questions you have with your health care provider. Document Released: 04/22/2004 Document Revised: 05/30/2017 Document Reviewed: 05/30/2017 Elsevier Interactive Patient Education  2019 Elsevier Inc.  

## 2018-09-17 NOTE — Progress Notes (Signed)
Subjective: [name] Branch presents today with painful, thick toenails 1-5 b/l that he cannot cut and which interfere with daily activities.  Pain is aggravated when wearing enclosed shoe gear.  His caregiver is present during the visit. Caregiver states [name] Branch is applying his moisturizer daily now and taking better ownership of his pedal hygiene.  Ladell Pier, MD is his PCP. Last visit was 06/06/2018.   Current Outpatient Medications:  .  AMBULATORY NON FORMULARY MEDICATION, Place 1 drop into both eyes at bedtime. Medication Name: genteal lubricant eye D 15 ML, Disp: , Rfl:  .  cholestyramine (QUESTRAN) 4 g packet, Take 1 packet (4 g total) by mouth 3 (three) times daily. With meals, Disp: 90 each, Rfl: 3 .  cloZAPine (CLOZARIL) 100 MG tablet, Take 100 mg by mouth 2 (two) times daily., Disp: , Rfl:  .  cloZAPine (CLOZARIL) 25 MG tablet, Take 125 mg by mouth 2 (two) times daily. , Disp: , Rfl:  .  fluvoxaMINE (LUVOX) 100 MG tablet, Take 300 mg by mouth at bedtime., Disp: , Rfl:  .  latanoprost (XALATAN) 0.005 % ophthalmic solution, Place 1 drop into both eyes at bedtime., Disp: , Rfl:  .  Misc. Devices MISC, Please provide patient with insurance approved incontinence garments/pads for incontinence of stool., Disp: 1 each, Rfl: 0 .  Multiple Vitamin (MULTIVITAMIN) capsule, Take 1 capsule by mouth daily., Disp: 90 capsule, Rfl: 3 .  pravastatin (PRAVACHOL) 40 MG tablet, TAKE ONE TABLET EACH DAY, Disp: 90 tablet, Rfl: 3 .  Wheat Dextrin (BENEFIBER) POWD, Take 15 mLs by mouth 3 (three) times daily with meals. Benefiber 1 tablespoon tid with meals, Disp: 1 Can, Rfl: 11  No Known Allergies  Objective: Vascular Examination: Capillary refill time <3 seconds x 10 digits Dorsalis pedis pulses 2/4 b/l Posterior tibial pulses present 2/4 b/l Sparse digital hair x 10 digits Skin temperature warm to warm b/l  Dermatological Examination: Skin with normal turgor, texture and  tone  Toenails 1-5 elongated, thickened, dystrophic with subungual debris b/l  Hyperkeratotic lesions noted plantarmedial aspect right hallux, right medial 1st metatarsal head and left medial 1st met head  Musculoskeletal: Muscle strength 5/5 to all LE muscle groups  Neurological: Sensation intact with 10 gram monofilament. Vibratory sensation intact.  Assessment: 1. Painful onychomycosis 1-5 b/l 2. Painful calluses b/l feet   Plan: 1. Discussed diagnoses on today. Literature dispensed. 2. Toenails 1-5 b/l were debrided in length and girth without complication 3. Calluses pared b/l for a total of 4 lesions:plantarmedial aspect right hallux, right medial 1st metatarsal head and medial aspect, left medial 1st met head 3.  Recommended change in shoe gear to [employer]. 4. Patient to continue soft, supportive shoe gear 5. Patient to report any pedal injuries to medical professional immediately. 6. Follow up 3 months.  7. Patient/POA to call should there be a concern in the interim.

## 2018-10-07 ENCOUNTER — Ambulatory Visit: Payer: Medicare Other | Admitting: Internal Medicine

## 2018-10-08 ENCOUNTER — Ambulatory Visit: Payer: Medicare Other | Admitting: Internal Medicine

## 2018-10-30 ENCOUNTER — Other Ambulatory Visit: Payer: Self-pay | Admitting: Gastroenterology

## 2018-10-30 DIAGNOSIS — R159 Full incontinence of feces: Principal | ICD-10-CM

## 2018-10-30 DIAGNOSIS — R152 Fecal urgency: Secondary | ICD-10-CM

## 2018-11-29 ENCOUNTER — Other Ambulatory Visit: Payer: Self-pay | Admitting: Gastroenterology

## 2018-11-29 DIAGNOSIS — R152 Fecal urgency: Secondary | ICD-10-CM

## 2018-11-29 DIAGNOSIS — R159 Full incontinence of feces: Principal | ICD-10-CM

## 2018-12-16 ENCOUNTER — Ambulatory Visit: Payer: Medicare Other | Admitting: Podiatry

## 2019-03-03 ENCOUNTER — Other Ambulatory Visit: Payer: Self-pay | Admitting: Gastroenterology

## 2019-03-03 DIAGNOSIS — R152 Fecal urgency: Secondary | ICD-10-CM

## 2019-03-06 ENCOUNTER — Telehealth: Payer: Self-pay

## 2019-03-06 NOTE — Telephone Encounter (Signed)
Call placed to patient's caregiver # [phone] to inquire about order request for surgical masks. Message left with CM call back # [phone]

## 2019-03-11 ENCOUNTER — Telehealth: Payer: Self-pay

## 2019-03-11 NOTE — Telephone Encounter (Signed)
Call placed to patient's caregiver, Jolayne Haines # [phone] to inquire about order request for surgical masks.  Message left requesting call back to this CM # [phone]

## 2019-03-14 ENCOUNTER — Telehealth: Payer: Self-pay | Admitting: Internal Medicine

## 2019-03-14 NOTE — Telephone Encounter (Signed)
[facility] [facility] called to ask about Mask paperwork that was sent over July 24th and would like to know if ti was received. Please follow up.

## 2019-03-18 ENCOUNTER — Telehealth: Payer: Self-pay

## 2019-03-18 NOTE — Telephone Encounter (Signed)
Attempted again to contact patient's caregiver, Jolayne Haines # [phone] to inquire about the request for surgical masks.  Message left with call back requested to this CM.  Call placed to patient's guardian, [name] # [phone]  as this CM has not been able to reach patient's caregiver. Message left requesting a call back to this CM. Call placed to [name] at work #, she answered and then call was disconnected.  Call back and message left requesting a call back to this CM # [phone]

## 2019-06-24 ENCOUNTER — Telehealth: Payer: Self-pay

## 2019-06-24 NOTE — Telephone Encounter (Signed)
Attempted to contact patient's legal guardian , [name] regarding another fax that has been received regarding request for surgical masks from [facility].  Dr Wynetta Emery will need televisit scheduled to address this request.  Calls placed to # [phone] and [phone] and messages left for [name] at both numbers requesting a call back to this CM # [phone]

## 2019-07-01 ENCOUNTER — Other Ambulatory Visit: Payer: Self-pay | Admitting: Gastroenterology

## 2019-07-01 DIAGNOSIS — R152 Fecal urgency: Secondary | ICD-10-CM

## 2019-08-04 ENCOUNTER — Other Ambulatory Visit: Payer: Self-pay | Admitting: Gastroenterology

## 2019-08-04 DIAGNOSIS — R152 Fecal urgency: Secondary | ICD-10-CM

## 2019-08-04 DIAGNOSIS — R159 Full incontinence of feces: Secondary | ICD-10-CM

## 2019-09-01 ENCOUNTER — Other Ambulatory Visit: Payer: Self-pay | Admitting: Gastroenterology

## 2019-09-01 DIAGNOSIS — R159 Full incontinence of feces: Secondary | ICD-10-CM

## 2019-09-01 DIAGNOSIS — R152 Fecal urgency: Secondary | ICD-10-CM

## 2019-10-31 ENCOUNTER — Other Ambulatory Visit: Payer: Self-pay | Admitting: Family Medicine

## 2019-10-31 DIAGNOSIS — R7989 Other specified abnormal findings of blood chemistry: Secondary | ICD-10-CM

## 2019-11-04 ENCOUNTER — Other Ambulatory Visit: Payer: Self-pay

## 2019-11-04 ENCOUNTER — Ambulatory Visit (INDEPENDENT_AMBULATORY_CARE_PROVIDER_SITE_OTHER): Payer: Medicare Other | Admitting: Podiatry

## 2019-11-04 DIAGNOSIS — B353 Tinea pedis: Secondary | ICD-10-CM

## 2019-11-04 DIAGNOSIS — B351 Tinea unguium: Secondary | ICD-10-CM

## 2019-11-04 MED ORDER — KETOCONAZOLE 2 % EX CREA: TOPICAL_CREAM | CUTANEOUS | 0 refills | Status: DC

## 2019-11-04 NOTE — Progress Notes (Signed)
  Subjective:  Patient ID: Kierre Konigsberg, male    DOB: 1965/10/22,  MRN: [id number]  Chief Complaint  Patient presents with  . Nail Problem    Nail trim 1-5 bilateral  . Callouses    Bilateral callous trims  . Foot Problem    Bilateral 2nd/3rd digits dark discoloration and tenderness, no known injuries.    54 y.o. male presents with the above complaint. History confirmed with patient.   Objective:  Physical Exam: warm, good capillary refill, no trophic changes or ulcerative lesions, normal DP and PT pulses and normal sensory exam. Nails thickened dystrophic, feet xerosis with scaling bilat  Assessment:   1. Tinea pedis of both feet   2. Onychomycosis      Plan:  Patient was evaluated and treated and all questions answered.  Tinea Pedis, Onychomycosis -Rx ketoconazole -Educated on pedal hygiene -F/u in 6 weeks  No follow-ups on file.

## 2019-11-13 ENCOUNTER — Ambulatory Visit
Admission: RE | Admit: 2019-11-13 | Discharge: 2019-11-13 | Disposition: A | Discharge status: Home / Self Care | Payer: Medicare Other | Source: Ambulatory Visit | Attending: Family Medicine | Admitting: Family Medicine

## 2019-11-13 DIAGNOSIS — R7989 Other specified abnormal findings of blood chemistry: Secondary | ICD-10-CM

## 2019-12-01 ENCOUNTER — Other Ambulatory Visit: Payer: Self-pay | Admitting: Gastroenterology

## 2019-12-01 DIAGNOSIS — R152 Fecal urgency: Secondary | ICD-10-CM

## 2019-12-01 DIAGNOSIS — R159 Full incontinence of feces: Secondary | ICD-10-CM

## 2019-12-18 ENCOUNTER — Other Ambulatory Visit: Payer: Self-pay

## 2019-12-18 ENCOUNTER — Ambulatory Visit (INDEPENDENT_AMBULATORY_CARE_PROVIDER_SITE_OTHER): Payer: Medicare Other | Admitting: Podiatry

## 2019-12-18 DIAGNOSIS — B353 Tinea pedis: Secondary | ICD-10-CM | POA: Diagnosis not present

## 2019-12-18 DIAGNOSIS — B351 Tinea unguium: Secondary | ICD-10-CM

## 2019-12-30 ENCOUNTER — Other Ambulatory Visit: Payer: Self-pay | Admitting: Gastroenterology

## 2019-12-30 DIAGNOSIS — R152 Fecal urgency: Secondary | ICD-10-CM

## 2019-12-31 ENCOUNTER — Other Ambulatory Visit: Payer: Self-pay

## 2019-12-31 DIAGNOSIS — R159 Full incontinence of feces: Secondary | ICD-10-CM

## 2019-12-31 MED ORDER — CHOLESTYRAMINE 4 G PO PACK: PACK | ORAL | 0 refills | Status: DC

## 2020-01-30 ENCOUNTER — Telehealth: Payer: Self-pay | Admitting: *Deleted

## 2020-01-30 DIAGNOSIS — R159 Full incontinence of feces: Secondary | ICD-10-CM

## 2020-01-30 MED ORDER — CHOLESTYRAMINE 4 G PO PACK: PACK | ORAL | 1 refills | Status: DC

## 2020-02-01 NOTE — Progress Notes (Signed)
  Subjective:  Patient ID: [name] Branch, male    DOB: 07/07/1966,  MRN: [id number]  Chief Complaint  Patient presents with  . Nail Problem    pt is here for a f/u on nail fungus, pt states that he is doing alot better, but states that his nails grow too fast, pt is also looking for a refill on ketocanozole as well..    54 y.o. male presents with the above complaint. History confirmed with patient.   Objective:  Physical Exam: warm, good capillary refill, no trophic changes or ulcerative lesions, normal DP and PT pulses and normal sensory exam. Nails thickened dystrophic, feet xerosis with scaling bilat  Assessment:   1. Tinea pedis of both feet   2. Onychomycosis      Plan:  Patient was evaluated and treated and all questions answered.  Tinea Pedis, Onychomycosis -Refill ketoconazole -Educated on pedal hygiene -F/u PRN  No follow-ups on file.

## 2020-02-03 ENCOUNTER — Other Ambulatory Visit: Payer: Self-pay | Admitting: Family Medicine

## 2020-02-03 DIAGNOSIS — R2681 Unsteadiness on feet: Secondary | ICD-10-CM

## 2020-02-03 NOTE — Telephone Encounter (Signed)
This was [name] Branch) was  refilled on 7/2 to [facility] in [place]

## 2020-02-08 ENCOUNTER — Encounter (HOSPITAL_COMMUNITY): Payer: Self-pay

## 2020-02-08 ENCOUNTER — Other Ambulatory Visit: Payer: Self-pay

## 2020-02-08 ENCOUNTER — Ambulatory Visit (HOSPITAL_COMMUNITY)
Admission: EM | Admit: 2020-02-08 | Discharge: 2020-02-08 | Disposition: A | Discharge status: Home / Self Care | Payer: Medicare Other

## 2020-02-08 DIAGNOSIS — T50905A Adverse effect of unspecified drugs, medicaments and biological substances, initial encounter: Secondary | ICD-10-CM | POA: Diagnosis not present

## 2020-02-08 DIAGNOSIS — R400 Somnolence: Secondary | ICD-10-CM

## 2020-02-08 DIAGNOSIS — R159 Full incontinence of feces: Secondary | ICD-10-CM

## 2020-02-08 DIAGNOSIS — R5383 Other fatigue: Secondary | ICD-10-CM

## 2020-02-08 NOTE — ED Provider Notes (Signed)
[name] Branch    CSN: [id number] Arrival date & time: 02/08/20  1523      History   Chief Complaint Chief Complaint  Patient presents with  . Allergic Reaction    HPI [name] Branch is a 54 y.o. male.   Patient reports to the office today with concerns for adverse reaction to hydrocodone.  Reports to the office with his caregiver who is his sister.  Patient has history of neurogenic bowel, sarcoidosis, schizophrenia, global developmental delay.  Caregiver reports that he has been more somnolent at home since taking hydrocodone.  Also reports that he is having episodes of fecal incontinence during the day.  She reports that this is normal at night, but not during the day.  She reports that he was taking hydrocodone postop for biopsies that were performed in his mouth.  She reports that he chews on pencils when he gets anxious, and that some of the carbon or lead was embedded in his mouth.  There are no alleviating factors.  They have not made any attempts to treat this at home.  Denies headache, cough, shortness of breath, nausea, vomiting, diarrhea, rash, fever, other symptoms.  ROS per HPI  The history is provided by the patient and a caregiver.    Past Medical History:  Diagnosis Date  . ABSCESS, ANAL/RECTAL REGIONS 08/18/2006   Annotation: Fournier gangrene Qualifier: History of  By: Garnette Scheuermann MD, Arlee Muslim    . Cardiomyopathy   . Colonic inertia   . Constipation   . Glaucoma   . Hyperlipidemia   . Hypovolemic shock ([place])   . Incontinent of feces   . Mental retardation   . 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   Annotation: chronic with stercoral ulcer  Qualifier: Diagnosis of  By: Garnette Scheuermann MD, Arlee Muslim    . Obstruction of colon ([facility])    recurrent  . Sarcoidosis   . Schizophrenia ([place])   . 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 Total self-care deficit    per caregiver he needs to have help bathing and cleaning self and needs help with all daily needs now     Patient Active Problem  List   Diagnosis Date Noted  . Incontinent of feces   . Total self-care deficit   . S/P total colectomy 07/20/2017  . Dizziness 10/12/2016  . Anemia of chronic disease 10/05/2014  . Colonic inertia s/p abdominal colectomy 09/25/2014 09/25/2014  . Hyperlipidemia 09/17/2008  . Glaucoma 10/10/2006  . Sarcoidosis 06/11/2006  . Schizophrenia, unspecified type ([place]) 06/11/2006  . Mental retardation 06/11/2006  . Chronic systolic CHF (congestive heart failure) ([place]) 06/11/2006  . 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    Past Surgical History:  Procedure Laterality Date  . COLECTOMY N/A 09/25/2014   Procedure: OPEN ABDOMINAL COLECTOMY;  Surgeon: Michael Boston, MD;  Location: [facility];  Service: General;  Laterality: N/A;  . COLONOSCOPY  2007  . groin surgery     boil drained  . PROCTOSCOPY N/A 09/25/2014   Procedure: RIGID PROCTOSCOPY;  Surgeon: Michael Boston, MD;  Location: [facility];  Service: General;  Laterality: N/A;       Home Medications    Prior to Admission medications   Medication Sig Start Date End Date Taking? Authorizing Provider  AMBULATORY NON FORMULARY MEDICATION Place 1 drop into both eyes at bedtime. Medication Name: genteal lubricant eye D 15 ML    [provider]  ammonium lactate (LAC-HYDRIN) 12 % lotion  10/06/19   [provider]  cholestyramine (QUESTRAN) 4 g  packet USE 1 PACKET AS DIRECTED 3 TIMES DAILY WITH MEALS. 01/30/20   Mauri Pole, MD  cloZAPine (CLOZARIL) 100 MG tablet Take 100 mg by mouth 2 (two) times daily.    [provider]  cloZAPine (CLOZARIL) 25 MG tablet Take 125 mg by mouth 2 (two) times daily.     [provider]  fluvoxaMINE (LUVOX) 100 MG tablet Take 300 mg by mouth at bedtime.    [provider]  hydrOXYzine (ATARAX/VISTARIL) 25 MG tablet 1 tablet by mouth   for agitation 06/03/19   [provider]  ketoconazole (NIZORAL) 2 % cream Apply 1 fingertip amount to each foot daily. 11/04/19   Evelina Bucy, DPM  latanoprost (XALATAN) 0.005 % ophthalmic solution Place 1 drop into both eyes at bedtime.    [provider]  mineral oil-hydrophilic petrolatum (AQUAPHOR) ointment Apply      apply to hands four times a day 06/03/19   [provider]  [place]. Devices MISC Please provide patient with insurance approved incontinence garments/pads for incontinence of stool. 02/11/18   [name], NP  Multiple Vitamin (MULTIVITAMIN) capsule Take 1 capsule by mouth daily. 03/25/18   Ladell Pier, MD  pravastatin (PRAVACHOL) 40 MG tablet TAKE ONE TABLET EACH DAY 03/25/18   Ladell Pier, MD  Wheat Dextrin (BENEFIBER) POWD Take 15 mLs by mouth 3 (three) times daily with meals. Benefiber 1 tablespoon tid with meals 04/08/18   Mauri Pole, MD    Family History Family History  Problem Relation Age of Onset  . Diabetes Mother   . Colon cancer Neg Hx   . Colon polyps Neg Hx   . Esophageal cancer Neg Hx   . Rectal cancer Neg Hx   . Stomach cancer Neg Hx     Social History Social History   Tobacco Use  . Smoking status: Never Smoker  . Smokeless tobacco: Never Used  Vaping Use  . Vaping Use: Never used  Substance Use Topics  . Alcohol use: No    Alcohol/week: 0.0 standard drinks  . Drug use: No     Allergies   Patient has no known allergies.   Review of Systems Review of Systems   Physical Exam Triage Vital Signs ED Triage Vitals [02/08/20 1558]  Enc Vitals Group     BP 115/75     Pulse Rate 84     Resp 18     Temp 98.3 F (36.8 C)     Temp Source Oral     SpO2 100 %     Weight      Height      Head Circumference      Peak Flow      Pain Score 0     Pain Loc      Pain Edu?      Excl. in [place]?    No data found.  Updated Vital Signs BP 115/75 (BP Location: Right Arm)   Pulse 84   Temp 98.3 F (36.8 C) (Oral)   Resp 18   SpO2 100%   Visual Acuity Right Eye Distance:   Left Eye Distance:   Bilateral Distance:    Right Eye Near:     Left Eye Near:    Bilateral Near:     Physical Exam Vitals and nursing note reviewed.  Constitutional:      General: He is not in acute distress.    Appearance: Normal appearance. He is well-developed. He is  not ill-appearing.  HENT:     Head: Normocephalic and atraumatic.  Eyes:     Extraocular Movements: Extraocular movements intact.     Conjunctiva/sclera: Conjunctivae normal.     Pupils: Pupils are equal, round, and reactive to light.  Cardiovascular:     Rate and Rhythm: Normal rate and regular rhythm.     Heart sounds: Normal heart sounds. No murmur heard.   Pulmonary:     Effort: Pulmonary effort is normal. No respiratory distress.     Breath sounds: Normal breath sounds. No stridor. No wheezing, rhonchi or rales.  Chest:     Chest wall: No tenderness.  Abdominal:     General: Bowel sounds are normal.     Palpations: Abdomen is soft.     Tenderness: There is no abdominal tenderness.  Musculoskeletal:        General: Normal range of motion.     Cervical back: Normal range of motion and neck supple.  Skin:    General: Skin is warm and dry.     Capillary Refill: Capillary refill takes less than 2 seconds.  Neurological:     Mental Status: He is alert. Mental status is at baseline.  Psychiatric:        Mood and Affect: Mood normal.        Behavior: Behavior normal.        Thought Content: Thought content normal.      UC Treatments / Results  Labs (all labs ordered are listed, but only abnormal results are displayed) Labs Reviewed - No data to display  EKG   Radiology No results found.  Procedures Procedures (including critical care time)  Medications Ordered in UC Medications - No data to display  Initial Impression / Assessment and Plan / UC Course  I have reviewed the triage vital signs and the nursing notes.  Pertinent labs & imaging results that were available during my care of the patient were reviewed by me and considered in my medical  decision making (see chart for details).     Adverse effect of drug Fecal incontinence Daytime somnolence  Presents today for concern about adverse effect from hydrocodone postop mouth biopsies He is at higher risk for serotonin syndrome given that he is taking Luvox and hydrocodone together Is also at a higher risk of sedation and respiratory suppression by combining Clozaril and hydrocodone Discussed with caregiver and patient that it would be beneficial for him to take Tylenol instead of the hydrocodone Requesting a statement in writing for patients other caregiver who administers his medications Request complied If he is still having issues of daytime somnolence and fecal incontinence, may follow-up with primary care If he is having any trouble swallowing, trouble breathing, decreased responsiveness, instructed to follow-up with the ER Caregiver verbalizes understanding and agreement with treatment plan Final Clinical Impressions(s) / UC Diagnoses   Final diagnoses:  Adverse effect of drug, initial encounter  Incontinence of feces, unspecified fecal incontinence type  Other fatigue  Daytime somnolence     Discharge Instructions     STOP the hydrocodone  May use tylenol instead. May also use ice packs for pain relief.  There is a higher risk of serotonin syndrome with the use of luvox and hydrocodone  The somnolence is very likely coming from the combination of clozaril and hydrocodone.  Follow up with primary care or this office as needed     ED Prescriptions    None     PDMP not reviewed this  encounter.   [name] Congress, NP 02/10/20 [phone]

## 2020-02-08 NOTE — ED Triage Notes (Signed)
Pt present a allergic reaction to hydrocodone that he was prescribed for a biopsies with his oral surgeon.  Pt has been having bowel movement on himself and is not alert as he should be.

## 2020-02-08 NOTE — Discharge Instructions (Addendum)
STOP the hydrocodone  May use tylenol instead. May also use ice packs for pain relief.  There is a higher risk of serotonin syndrome with the use of luvox and hydrocodone  The somnolence is very likely coming from the combination of clozaril and hydrocodone.  Follow up with primary care or this office as needed

## 2020-02-13 ENCOUNTER — Telehealth: Payer: Self-pay

## 2020-02-13 NOTE — Telephone Encounter (Signed)
Called pt spoke to guardian [name], she states pt has new PCP & does not need Dr. Wynetta Emery to order / approve incontinence supplies for pt.

## 2020-02-16 ENCOUNTER — Ambulatory Visit: Payer: Medicare Other | Admitting: Internal Medicine

## 2020-02-18 ENCOUNTER — Telehealth: Payer: Self-pay | Admitting: Internal Medicine

## 2020-02-18 NOTE — Telephone Encounter (Signed)
We have received paperwork and I have tried to contact pt to schedule an appointment. Pt hasn't been seen since 06/06/2018

## 2020-02-18 NOTE — Telephone Encounter (Signed)
Please advise:  Copied from [place] [phone]. Topic: General - Inquiry >> Feb 17, 2020  9:47 AM Alanda Slim E wrote: Reason for CRM: [facility] from [facility] called checking for paperwork for incontinence supplies / it was faxed on July 6 and 13th/ confirming if received/ please advise

## 2020-03-08 ENCOUNTER — Other Ambulatory Visit: Payer: Self-pay

## 2020-03-08 ENCOUNTER — Ambulatory Visit
Admission: RE | Admit: 2020-03-08 | Discharge: 2020-03-08 | Disposition: A | Discharge status: Home / Self Care | Payer: Medicare Other | Source: Ambulatory Visit | Attending: Family Medicine | Admitting: Family Medicine

## 2020-03-08 DIAGNOSIS — R2681 Unsteadiness on feet: Secondary | ICD-10-CM

## 2020-03-08 MED ORDER — GADOBENATE DIMEGLUMINE 529 MG/ML IV SOLN
20.0000 mL | Freq: Once | INTRAVENOUS | Status: AC | PRN
  Administered 2020-03-08: 20 mL via INTRAVENOUS

## 2020-04-15 ENCOUNTER — Telehealth: Payer: Self-pay | Admitting: Gastroenterology

## 2020-04-15 NOTE — Telephone Encounter (Signed)
Please advise holding Questran until he has a bowel movement and titrate it based on response. If no BM with increased fluid intake, do a bowel purge with Miralax

## 2020-04-15 NOTE — Telephone Encounter (Signed)
Left information on her voicemail and sent her information using the My Chart.

## 2020-04-15 NOTE — Telephone Encounter (Signed)
Pt's guardian [name] is requesting a call back from a nurse to discuss the pt's lack of bowel movement.

## 2020-04-15 NOTE — Telephone Encounter (Signed)
Spoke with Ms Jone Baseman (legal guardian) and with Bailey Mech (care giver) regarding patient. Bailey Mech tells me the patient did not have a bowel movement today or yesterday. He normal has a bowel movement each day. He is on Benefiber and Questran. He drinks 3 bottles of water daily. No recent pain medications.  Patient has a history of neurogenic bowel. S/P colectomy in 2016. New diagnosis of cancer of the salivary gland.  Discussed supportive care. Care giver can offer an extra bottle of water, try a hot beverage such as tea or coffee, or leafy green vegetable.

## 2020-04-20 ENCOUNTER — Other Ambulatory Visit: Payer: Self-pay

## 2020-04-20 ENCOUNTER — Ambulatory Visit (HOSPITAL_COMMUNITY)
Admission: EM | Admit: 2020-04-20 | Discharge: 2020-04-20 | Disposition: A | Discharge status: Home / Self Care | Payer: Medicare Other

## 2020-04-20 ENCOUNTER — Encounter (HOSPITAL_COMMUNITY): Payer: Self-pay | Admitting: Emergency Medicine

## 2020-04-20 DIAGNOSIS — M7918 Myalgia, other site: Secondary | ICD-10-CM | POA: Diagnosis not present
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
## 2020-04-22 DIAGNOSIS — C069 Malignant neoplasm of mouth, unspecified: Secondary | ICD-10-CM | POA: Insufficient documentation

## 2020-04-23 ENCOUNTER — Ambulatory Visit
Admission: RE | Admit: 2020-04-23 | Discharge: 2020-04-23 | Disposition: A | Discharge status: Home / Self Care | Payer: Self-pay | Source: Ambulatory Visit | Attending: Radiation Oncology | Admitting: Radiation Oncology

## 2020-04-23 ENCOUNTER — Other Ambulatory Visit: Payer: Self-pay | Admitting: Radiation Oncology

## 2020-04-23 DIAGNOSIS — C059 Malignant neoplasm of palate, unspecified: Secondary | ICD-10-CM

## 2020-04-26 NOTE — Progress Notes (Signed)
Oncology Nurse Navigator Documentation  Placed introductory call to new referral patient's sister [name] who is his legal guardian. Mr. Si lives at an Alternate Living facility through an agency. He has a 1:1 caregiver names Shea Evans (Heath Lark is a male).  Introduced myself as the H&N oncology nurse navigator that works with [name] to whom he has been referred by Dr. Juluis Rainier (Radiation Oncologist at [facility]). She confirmed understanding of referral.  Briefly explained my role as his navigator, provided my contact information.   Confirmed understanding of upcoming telephone appts at [facility],   I explained the purpose of a dental evaluation prior to starting RT, indicated he would be contacted by [facility] to arrange an appt.    I encouraged her to call with questions/concerns as he moves forward with appts and procedures.    She verbalized understanding of information provided, expressed appreciation for my call.   Navigator Initial Assessment . Employment Status: He is unemployed . Currently on FMLA / STD: no . Living Situation: He lives in a care facility with a 1:1 caregiver names Shea Evans. . Support System: His sister [name] is his legal guardian . PCP: Dr. Darlina Sicilian . PCD: Dr. Iantha Fallen . Financial Concerns:no . Transportation Needs: no . Sensory Deficits:no . Language Barriers/Interpreter Needed:  no . Ambulation Needs: no . DME Used in Home: no . Psychosocial Needs: He has cognitive deficits . Concerns/Needs Understanding Cancer:  addressed/answered by navigator to best of ability . Self-Expressed Needs: no   Harlow Asa RN, BSN, OCN Head & Neck Oncology Nurse [place] at [facility] Phone # [phone]  Fax # [phone]

## 2020-04-27 NOTE — Progress Notes (Signed)
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 He was asymptomatic from the mass, and was referred to Dr. Glenford Peers from ENT in [place], who performed an incisional biopsy revealing hyalinizing clear cell carcinoma. He was then referred to [facility] for further evaluation.   Biopsies revealed:  03/12/2020   03/05/2020   Nutrition Status Yes No Comments  Weight changes? [x]  []  PCP put patient on a diet which resulted in a 25lb weight loss  Swallowing concerns? []  [x]    PEG? []  [x]     Referrals Yes No Comments  Social Work? [x]  []    Dentistry? [x]  []    Swallowing therapy? [x]  []    Nutrition? [x]  []    Med/Onc? []  [x]     Safety Issues Yes No Comments  Prior radiation? []  [x]    Pacemaker/ICD? []  [x]    Possible current pregnancy? []  [x]    Is the patient on methotrexate? []  [x]     Tobacco/Marijuana/Snuff/ETOH use:  Never used  Past/Anticipated interventions by otolaryngology, if any:  03/12/2020 [name]  possible MUSCLE, MYOCUTANEOUS, OR FASCIOCUTANEOUS FLAP; HEAD AND NECK WITH NAMED VASCULAR PEDICLE (IE, BUCCINATORS, GENIOGLOSSUS, TEMPORALIS, MASSETER, STERNOCLEIDOMASTOID, LEVATOR SCAPULAE) (Right) - right buccinator myomucosal flap RESECTION OF PALATE OR EXTENSIVE RESECTION OF LESION (Right) EXTRACTION TEETH (Right)   Past/Anticipated interventions by medical oncology, if any:  No referral ordered   Current Complaints / other details:  Patient's sister [name] is patient's legal guardian. Mr. Cohick lives at an ALF through an agency and has a 1:1 caregiver

## 2020-04-28 ENCOUNTER — Other Ambulatory Visit: Payer: Self-pay

## 2020-04-28 ENCOUNTER — Ambulatory Visit
Admission: RE | Admit: 2020-04-28 | Discharge: 2020-04-28 | Disposition: A | Discharge status: Home / Self Care | Payer: Medicare Other | Source: Ambulatory Visit | Attending: Radiation Oncology | Admitting: Radiation Oncology

## 2020-04-28 ENCOUNTER — Ambulatory Visit: Payer: Medicare Other

## 2020-04-28 ENCOUNTER — Ambulatory Visit: Payer: Medicare Other | Admitting: Radiation Oncology

## 2020-04-28 DIAGNOSIS — C058 Malignant neoplasm of overlapping sites of palate: Secondary | ICD-10-CM

## 2020-04-28 DIAGNOSIS — C050 Malignant neoplasm of hard palate: Secondary | ICD-10-CM

## 2020-04-28 NOTE — Progress Notes (Signed)
Oncology Nurse Navigator Documentation  Spoke with patient's sister and caregiver during initial telephone consult with [name].  . Further introduced myself as his/their Navigator, explained my role as a member of the Care Team. . Assisted with post-consult appt scheduling. . Discussed with them again that Dr. Ritta Slot office should be contacting them for an in person appointment. I have already contacted Dr. Ritta Slot office regarding the patient. . Referral placed for SW, SLP, and Nutrition.   . They verbalized understanding of information provided. . I encouraged them to call with questions/concerns moving forward.  Harlow Asa, RN, BSN, OCN Head & Neck Oncology Nurse [place] at [facility] [phone]

## 2020-04-28 NOTE — Progress Notes (Signed)
Radiation Oncology         [phone] ________________________________  Initial Outpatient Consultation by telephone.  The patient opted for telemedicine to maximize safety during the pandemic.  MyChart video was not obtainable.   Name: [name] Branch MRN: [id number]  Date: 04/28/2020  DOB: 06/30/1966  [id number], [name] Picket, MD  [name] Harvest, MD   REFERRING PHYSICIAN: Bess Harvest, MD  DIAGNOSIS:    ICD-10-CM   1. Cancer of overlapping sites of palate ([place])  C05.8   2. 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 male who presented to his dentist in July of 2021 and was noted to have an oral lesion.  Subsequently, the patient saw Dr. Glenford Branch, ENT in [place], who performed an incisional biopsy on 02/05/2020 that showed low-grade salivary gland adenocarcinoma; hyalinizing clear cell carcinoma.  Pertinent imaging thus far includes CT scan of neck performed on 03/05/2020 revealing a 0.8 x 1.2 x 1.5 cm lesion at the junction of the hard and soft palate. Increased size and prominence of the right cervical Ib nodes were also noted. CT scan of chest on that same day revealed a 4 mm pulmonary nodule in the right middle lobe. MRI of brain was performed on 03/08/2020 and showed a mass lesion on the roof of the right side of the mouth that was compatible with neoplasm. The brain appeared normal.  I have personally reviewed his imaging.   The patient was referred to Dr. Lennox Branch, oral surgeon at [facility], who performed a local resection of the hard palate with buccinator myomucosal flap reconstruction on 03/12/2020. 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  Depth of invasion 7 mm.  There was no lymphovascular invasion, but there was perineural invasion involving 0.2 mm of the largest nerve. The deep margin was positive for invasive tumor.  No nodes were removed.  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  Following surgery, the patient was seen by Dr. Leitha Branch and Dr. Charm Branch, radiation oncologist at [facility], on 04/21/2020. Given the high risk of recurrence in the setting of a positive deep margin and PNI, it was recommended that he proceed with treatment to the resection bed (including the V2 nerve branch to the foramen rotundum) to a total dose of 50 Gy at 2.5 Gy per fraction. Although the patient and his family agreed with treatment, they preferred to complete treatment closer to home and were thus referred here.    Nutrition Status Yes No Comments  Weight changes? [x]  []  PCP put patient on a diet which resulted in a 25lb weight loss  Swallowing concerns? []  [x]    PEG? []  [x]     Referrals Yes No Comments  Social Work? [x]  []    Dentistry? [x]  []    Swallowing therapy? [x]  []    Nutrition? [x]  []    Med/Onc? []  [x]     Safety Issues Yes No Comments  Prior radiation? []  [x]    Pacemaker/ICD? []  [x]    Possible current pregnancy? []  [x]    Is the patient on methotrexate? []  [x]     Tobacco/Marijuana/Snuff/ETOH use:  Never used  Current Complaints / other details:  Patient's sister [name] Branch is patient's legal guardian. [name] lives at an ALF through an agency and has a 1:1 caregiver. He has severe cognitive delay and trouble following basic instructions.  He is incontinent.    PREVIOUS RADIATION THERAPY: No  PAST MEDICAL HISTORY:  has a past medical history of ABSCESS, ANAL/RECTAL REGIONS (08/18/2006), Cardiomyopathy, Colonic inertia, Constipation, Glaucoma, Hyperlipidemia, Hypovolemic shock ([place]), Incontinent of feces, Mental retardation, Neurogenic bowel  (06/11/2006), Obstruction of colon ([place]), Sarcoidosis, Schizophrenia ([place]), Splenomegaly, and Total self-care deficit.    PAST SURGICAL HISTORY: Past Surgical History:  Procedure Laterality Date  . COLECTOMY N/A 09/25/2014   Procedure: OPEN ABDOMINAL COLECTOMY;  Surgeon: Michael Boston, MD;  Location: [facility];  Service: General;  Laterality: N/A;  . COLONOSCOPY  2007  . groin surgery     boil drained  . PROCTOSCOPY N/A 09/25/2014   Procedure: RIGID PROCTOSCOPY;  Surgeon: Michael Boston, MD;  Location: [facility];  Service: General;  Laterality: N/A;    FAMILY HISTORY: family history includes Diabetes in his mother.  SOCIAL HISTORY:  reports that he has never smoked. He has never used smokeless tobacco. He reports that he does not drink alcohol and does not use drugs.  ALLERGIES: Hydrocodone  MEDICATIONS:  Current Outpatient Medications  Medication Sig Dispense Refill  . AMBULATORY NON FORMULARY MEDICATION Place 1 drop into both eyes at bedtime. Medication Name: genteal lubricant eye D 15 ML    . ammonium lactate (LAC-HYDRIN) 12 % lotion     . cloZAPine (CLOZARIL) 100 MG tablet Take 100 mg by mouth 2 (two) times daily.    . cloZAPine (CLOZARIL) 25 MG tablet Take 125 mg by mouth 2 (two) times daily.     . dapagliflozin propanediol (FARXIGA) 10 MG TABS tablet Take 10 mg by mouth every morning.    . fluvoxaMINE (LUVOX) 100 MG tablet Take 300 mg by mouth at bedtime.    . hydrOXYzine (ATARAX/VISTARIL) 25 MG tablet 1 tablet by mouth   for agitation    . ketoconazole (NIZORAL) 2 % cream Apply 1 fingertip amount to each foot daily. 30 g 0  . latanoprost (XALATAN) 0.005 % ophthalmic solution Place 1 drop into both eyes at bedtime.    Marland Kitchen losartan (COZAAR) 25 MG tablet Take 25 mg by mouth every morning.    . metoprolol succinate (TOPROL-XL) 25 MG 24 hr tablet Take 25 mg by mouth every morning.    . mineral oil-hydrophilic petrolatum (AQUAPHOR) ointment Apply      apply to hands four times a day    . Misc.  Devices MISC Please provide patient with insurance approved incontinence garments/pads for incontinence of stool. 1 each 0  . Multiple Vitamin (MULTIVITAMIN) capsule Take 1 capsule by mouth daily. 90 capsule 3  . pravastatin (PRAVACHOL) 40 MG tablet TAKE ONE TABLET EACH DAY 90 tablet 3  . Travoprost, BAK Free, (TRAVATAN) 0.004 % SOLN ophthalmic solution Apply 1 drop to eye as needed.    . Wheat Dextrin (BENEFIBER) POWD Take 15 mLs by mouth 3 (three) times daily with meals. Benefiber 1 tablespoon tid with meals 1 Can 11  . cholestyramine (QUESTRAN) 4 g packet USE 1 PACKET AS DIRECTED 3 TIMES DAILY WITH MEALS. (Patient not taking: Reported on 04/28/2020) 90 each 1   No current facility-administered medications for this encounter.    REVIEW OF SYSTEMS:  Notable for that above.   PHYSICAL EXAM:  vitals were not taken for this visit.     LABORATORY DATA:  Lab Results  Component Value Date   WBC 4.8 02/19/2018   HGB 13.9 02/19/2018   HCT 42.7 02/19/2018   MCV 92.3 02/19/2018   PLT 163.0 02/19/2018   CMP     Component Value Date/Time   NA 140 02/19/2018 1544   NA 138 11/23/2017 1108   K 4.2 02/19/2018 1544   CL 103 02/19/2018 1544   CO2 32 02/19/2018 1544   GLUCOSE 96 02/19/2018 1544   BUN 19 02/19/2018 1544   BUN 9 11/23/2017 1108   CREATININE 1.19 02/19/2018 1544   CREATININE 1.01 09/02/2012 1022   CALCIUM 9.6 02/19/2018 1544   PROT 7.6 02/19/2018 1544   PROT 6.8 11/23/2017 1108   ALBUMIN 4.3 02/19/2018 1544   ALBUMIN 4.3 11/23/2017 1108   AST 23 02/19/2018 1544   ALT 27 02/19/2018 1544   ALKPHOS 89 02/19/2018 1544   BILITOT 0.3 02/19/2018 1544   BILITOT 0.6 11/23/2017 1108   GFRNONAA >60 12/30/2017 1252   GFRNONAA 89 09/02/2012 1022   GFRAA >60 12/30/2017 1252   GFRAA >89 09/02/2012 1022      Lab Results  Component Value Date   TSH 1.670 12/31/2017     RADIOGRAPHY: As above, I personally reviewed his imaging and discussed it with  radiology  IMPRESSION/PLAN:  This is a delightful patient with hard/soft palate cancer with positive margins and significant PNI. I recommend adjuvant radiotherapy for this patient.  I agree w/ Dr. Mechele Dawley recommendations for hypofractionation and treatment of the tumor bed to skull base (V2--> foramen rotundum).  I'm concerned that given the patient's medical issues and cognitive issues, he's at risk of failure to thrive if he loses the will to eat. I will verify if our dentist can create a tongue positioner to reduce risk of dysgeusia during/after RT.   He did not have any nodes removed, and his imaging showed nonspecific mild enlargement of right (ipsi) level IB nodes.  If I treat the ipsilateral neck, there will be more low dose to his tongue. His cancer was negative for LVI.  All of this being taken into consideration, I will reach out to Dr. Juluis Rainier to see if he has a strong opinion as to whether or not to treat the ipsilateral neck empirically.  I discussed the potential risks, benefits, and side effects of radiotherapy today.  I explained to the patient sister and his caregiver the risks of acute and late effects. We discussed that some of the most bothersome acute effects may be mucositis, dysgeusia, salivary changes, skin irritation, hair loss, dehydration, weight loss and fatigue. We talked about late effects which include but are not necessarily limited to dysphagia, hypothyroidism, vascular or brain or nerve injury, spinal cord injury, xerostomia. No guarantees of treatment were given.  The enthusiastic about proceeding with treatment.    Simulation (treatment planning) will take place after he is released by dentistry  We also discussed that the treatment of head and neck cancer is a multidisciplinary process to maximize treatment outcomes and quality of life. For this reason the following referrals have been or will be made:   Dentistry for dental evaluation, possible tongue positioner  fabrication (SPDs may be possible as well or alternatively), and /or advice on reducing risk of cavities, osteoradionecrosis, or other oral issues.   Nutritionist for nutrition support during and after treatment.   Social work for social support.     On date of service, in total, I spent 55 minutes on this encounter. This encounter was  provided by telemedicine platform by telephone.  The patient opted for telemedicine to maximize safety during the pandemic.  MyChart video was not obtainable. The patient has given verbal consent for this type of encounter and has been advised to only accept a meeting of this type in a secure network environment. The attendants for this meeting include Eppie Gibson  and [name].  During the encounter, Eppie Gibson was located at [facility] Radiation Oncology Department.  Hovanes Hymas was located at home.  __________________________________________   Eppie Gibson, MD  This document serves as a record of services personally performed by Eppie Gibson, MD. It was created on his behalf by Clerance Lav, a trained medical scribe. The creation of this record is based on the scribe's personal observations and the provider's statements to them. This document has been checked and approved by the attending provider.

## 2020-04-29 ENCOUNTER — Telehealth: Payer: Self-pay | Admitting: Nutrition

## 2020-04-29 ENCOUNTER — Encounter: Payer: Self-pay | Admitting: Licensed Clinical Social Worker

## 2020-04-29 NOTE — Telephone Encounter (Signed)
Called pt per 9/29 sch msg - left message with appt date and time

## 2020-04-29 NOTE — Progress Notes (Signed)
[name] Branch  Clinical Social Branch was referred by Education officer, community for assessment of psychosocial needs for new patient with head & neck cancer.   Clinical Social Worker contacted legal guardian, sister [name] Branch, by phone  to offer support and assess for needs.  Patient is unable to make decisions for himself due to cognitive abilities. He currently lives with a caregiver, [name]. [name] states no immediate needs, but is clear that if certain medical concerns arise (ex: feeding tube), that he will likely be unable to stay with current caregiver and need to go to a skilled facility. CSW gave website to look at [facility] facilities so guardian is aware of resources in the area.  CSW provided empathic listening around the stress of trying to figure out diagnosis. [name] is a strong advocate for her brother and prioritizes his needs.  CSW provided direct contact information to guardian in case further needs arise.   Harold Moncus, [place], [place] Worker [facility]

## 2020-05-03 ENCOUNTER — Encounter: Payer: Self-pay | Admitting: Radiation Oncology

## 2020-05-03 DIAGNOSIS — C050 Malignant neoplasm of hard palate: Secondary | ICD-10-CM

## 2020-05-03 NOTE — Progress Notes (Signed)
Dental Form with Estimates of Radiation Dose      Diagnosis:    ICD-10-CM   1. 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  Daily?: yes  # of weeks of radiotherapy: 4  Chemotherapy?: no  Anticipated xerostomia:  Mild permanent   Pre-simulation needs:  Tongue positioner/bite block - this would be more of a priority than scatter protection devices. I would love to displace the tongue caudally to avoid his taste buds when we treat his palate. And,would love to displace his mandibular teeth/roots.  Simulation: ASAP   Other Notes:  Please contact Eppie Gibson, MD, with patient's disposition after evaluation and/or dental treatment.

## 2020-05-05 ENCOUNTER — Encounter (HOSPITAL_COMMUNITY): Payer: Self-pay | Admitting: Dentistry

## 2020-05-05 ENCOUNTER — Ambulatory Visit (HOSPITAL_COMMUNITY): Payer: Self-pay | Admitting: Dentistry

## 2020-05-05 ENCOUNTER — Other Ambulatory Visit: Payer: Self-pay

## 2020-05-05 ENCOUNTER — Inpatient Hospital Stay: Payer: Medicare Other | Attending: Radiation Oncology | Admitting: Nutrition

## 2020-05-05 DIAGNOSIS — C060 Malignant neoplasm of cheek mucosa: Secondary | ICD-10-CM

## 2020-05-05 DIAGNOSIS — Z23 Encounter for immunization: Secondary | ICD-10-CM | POA: Insufficient documentation

## 2020-05-05 DIAGNOSIS — C050 Malignant neoplasm of hard palate: Secondary | ICD-10-CM | POA: Insufficient documentation

## 2020-05-05 DIAGNOSIS — Z01818 Encounter for other preprocedural examination: Secondary | ICD-10-CM

## 2020-05-05 NOTE — Consult Note (Signed)
DENTAL CONSULTATION  Date of Consultation:  05/05/2020 Referring Provider:                  Eppie Gibson, MD  Patient Name:   [name] Branch Date of Birth:   [birth year] Medical Record Number: [id number]   __________________________________________________  PLAN/RECOMMENDATIONS: . Patient is cleared for radiation therapy from a dental perspective at this time.  There are NO signs of severe dental decay, acute infection or abscess. . Impressions taken today to try and fabricate tongue positioner.  Will have patient return for completion/delivery prior to his simulation.   Recommend patient return to our clinic 49mo s/p radiation therapy for a follow-up appointment, and after that return to his regular dentist once he is optimized medically for routine dental care. . Discussed in detail all options with the patient, the patient's legal guardian and his caregiver and they are agreeable to the plan. _________________________________________________   COVID 19 SCREENING: The patient denies symptoms concerning for COVID-19 infection including fever, chills, cough, or newly developed shortness of breath.  VITAL SIGNS: BP 126/73 (BP Location: Right Arm)   Pulse 95   Temp 98.1 F (36.7 C)   CHIEF COMPLAINT: "Check to make sure his mouth is healthy enough for radiation treatments."  HPI: [name] Branch is a pleasant 54 y.o. 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   Dental History: [name] Branch does see a dentist every 57mos for routine cleaning and dental exams.  He last went in July of 2021 where they found the area on his palate as suspicious and further was biopsied at [facility].  Patient's sister reports that he is able to brush his teeth on his own, but she is  present to make sure he brushes okay.  She reports that he only brushes once a day and does not floss.  [name] Branch currently denies any dental/oral pain or sensitivity.   PROBLEM LIST: Patient Active Problem List   Diagnosis Date Noted  . 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[place]) 05/03/2020  . Incontinent of feces   . Total self-care deficit   . S/P total colectomy 07/20/2017  . Dizziness 10/12/2016  . Anemia of chronic disease 10/05/2014  . Colonic inertia s/p abdominal colectomy 09/25/2014 09/25/2014  . Hyperlipidemia 09/17/2008  . Glaucoma 10/10/2006  . Sarcoidosis 06/11/2006  . Schizophrenia, unspecified type ([place]) 06/11/2006  . Mental retardation 06/11/2006  . Chronic systolic CHF (congestive heart failure) ([place]) 06/11/2006  . 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   PMH: Past Medical History:  Diagnosis Date  . ABSCESS, ANAL/RECTAL REGIONS 08/18/2006   Annotation: Fournier gangrene Qualifier: History of  By: Garnette Scheuermann MD, Arlee Muslim    . Cardiomyopathy   . Colonic inertia   . Constipation   . Glaucoma   . Hyperlipidemia   . Hypovolemic shock ([place])   . Incontinent of feces   . Mental retardation   . 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   Annotation: chronic with stercoral ulcer  Qualifier: Diagnosis of  By: Garnette Scheuermann MD, Arlee Muslim    . Obstruction of colon ([facility])    recurrent  . Sarcoidosis   . Schizophrenia ([place])   . 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 Total self-care deficit    per caregiver he needs to have help bathing and cleaning self and needs help with all daily needs now  PSH: Past Surgical History:  Procedure Laterality Date  . COLECTOMY N/A 09/25/2014   Procedure: OPEN ABDOMINAL COLECTOMY;  Surgeon: Michael Boston, MD;  Location: [facility] ORS;  Service: General;  Laterality: N/A;  . COLONOSCOPY  2007  . groin surgery     boil drained  . PROCTOSCOPY N/A 09/25/2014   Procedure: RIGID PROCTOSCOPY;  Surgeon: Michael Boston, MD;  Location: [facility];  Service: General;  Laterality: N/A;   ALLERGIES: Allergies  Allergen  Reactions  . Hydrocodone Other (See Comments)    Exacerbation of urinary and fecal incontinence   MEDICATIONS: Current Outpatient Medications  Medication Sig Dispense Refill  . AMBULATORY NON FORMULARY MEDICATION Place 1 drop into both eyes at bedtime. Medication Name: genteal lubricant eye D 15 ML    . ammonium lactate (LAC-HYDRIN) 12 % lotion     . cholestyramine (QUESTRAN) 4 g packet USE 1 PACKET AS DIRECTED 3 TIMES DAILY WITH MEALS. (Patient not taking: Reported on 04/28/2020) 90 each 1  . cloZAPine (CLOZARIL) 100 MG tablet Take 100 mg by mouth 2 (two) times daily.    . cloZAPine (CLOZARIL) 25 MG tablet Take 125 mg by mouth 2 (two) times daily.     . dapagliflozin propanediol (FARXIGA) 10 MG TABS tablet Take 10 mg by mouth every morning.    . fluvoxaMINE (LUVOX) 100 MG tablet Take 300 mg by mouth at bedtime.    . hydrOXYzine (ATARAX/VISTARIL) 25 MG tablet 1 tablet by mouth   for agitation    . ketoconazole (NIZORAL) 2 % cream Apply 1 fingertip amount to each foot daily. 30 g 0  . latanoprost (XALATAN) 0.005 % ophthalmic solution Place 1 drop into both eyes at bedtime.    Marland Kitchen losartan (COZAAR) 25 MG tablet Take 25 mg by mouth every morning.    . metoprolol succinate (TOPROL-XL) 25 MG 24 hr tablet Take 25 mg by mouth every morning.    . mineral oil-hydrophilic petrolatum (AQUAPHOR) ointment Apply      apply to hands four times a day    . Misc. Devices MISC Please provide patient with insurance approved incontinence garments/pads for incontinence of stool. 1 each 0  . Multiple Vitamin (MULTIVITAMIN) capsule Take 1 capsule by mouth daily. 90 capsule 3  . pravastatin (PRAVACHOL) 40 MG tablet TAKE ONE TABLET EACH DAY 90 tablet 3  . Travoprost, BAK Free, (TRAVATAN) 0.004 % SOLN ophthalmic solution Apply 1 drop to eye as needed.    . Wheat Dextrin (BENEFIBER) POWD Take 15 mLs by mouth 3 (three) times daily with meals. Benefiber 1 tablespoon tid with meals 1 Can 11   No current  facility-administered medications for this visit.   LABS: Lab Results  Component Value Date   WBC 4.8 02/19/2018   HGB 13.9 02/19/2018   HCT 42.7 02/19/2018   MCV 92.3 02/19/2018   PLT 163.0 02/19/2018      Component Value Date/Time   NA 140 02/19/2018 1544   NA 138 11/23/2017 1108   K 4.2 02/19/2018 1544   CL 103 02/19/2018 1544   CO2 32 02/19/2018 1544   GLUCOSE 96 02/19/2018 1544   BUN 19 02/19/2018 1544   BUN 9 11/23/2017 1108   CREATININE 1.19 02/19/2018 1544   CREATININE 1.01 09/02/2012 1022   CALCIUM 9.6 02/19/2018 1544   GFRNONAA >60 12/30/2017 1252   GFRNONAA 89 09/02/2012 1022   GFRAA >60 12/30/2017 1252   GFRAA >89 09/02/2012 1022   Lab Results  Component Value Date   INR 0.96  12/30/2017   No results found for: PTT  SOCIAL HISTORY: Social History   Socioeconomic History  . Marital status: Single    Spouse name: Not on file  . Number of children: 0  . Years of education: Not on file  . Highest education level: Not on file  Occupational History    Employer: DISABLED  Tobacco Use  . Smoking status: Never Smoker  . Smokeless tobacco: Never Used  Vaping Use  . Vaping Use: Never used  Substance and Sexual Activity  . Alcohol use: No    Alcohol/week: 0.0 standard drinks  . Drug use: No  . Sexual activity: Not on file    Comment:    Other Topics Concern  . Not on file  Social History Narrative   Has a caretaker   Social Determinants of Health   Financial Resource Strain:   . Difficulty of Paying Living Expenses: Not on file  Food Insecurity:   . Worried About Charity fundraiser in the Last Year: Not on file  . Ran Out of Food in the Last Year: Not on file  Transportation Needs:   . Lack of Transportation (Medical): Not on file  . Lack of Transportation (Non-Medical): Not on file  Physical Activity:   . Days of Exercise per Week: Not on file  . Minutes of Exercise per Session: Not on file  Stress:   . Feeling of Stress : Not on file   Social Connections:   . Frequency of Communication with Friends and Family: Not on file  . Frequency of Social Gatherings with Friends and Family: Not on file  . Attends Religious Services: Not on file  . Active Member of Clubs or Organizations: Not on file  . Attends Archivist Meetings: Not on file  . Marital Status: Not on file  Intimate Partner Violence:   . Fear of Current or Ex-Partner: Not on file  . Emotionally Abused: Not on file  . Physically Abused: Not on file  . Sexually Abused: Not on file   FAMILY HISTORY: Family History  Problem Relation Age of Onset  . Diabetes Mother   . Colon cancer Neg Hx   . Colon polyps Neg Hx   . Esophageal cancer Neg Hx   . Rectal cancer Neg Hx   . Stomach cancer Neg Hx      REVIEW OF SYSTEMS: Reviewed with the patient as per HPI. PSYCH: Patient denies having dental phobia.   DENTAL EXAMINATION: GENERAL: The patient is well-appearing and in no apparent distress. HEAD AND NECK:  No lymphadenopathy, edema, erythema or trismus.  MIO = 15mm. INTRAORAL EXAM: Soft tissues appear well-perfused.  Good salivary flow noted upon examination.  Bilateral mandibular tori.  Large expanding lesion noted on the upper right posterior hard palate and junction of soft palate. DENTITION: Overall good remaining dentition.  Missing teeth, generalized attrition, generalized plaque accumulation, large diastema b/w both maxillary lateral anterior teeth and canines.  The patient is maintaining fair oral hygiene. PERIODONTAL: Pink, healthy gingival tissue with blunted papilla. DENTAL CARIES: #19 and #30 have small B pit caries. RADIOGRAPHIC INTERPRETATION (PAN): Condyles seated bilaterally in fossas.  All visualized osseous structures appear WNL. Generalized mild horizontal bone loss consistent with generalized mild periodontitis. Missing teeth, radiographic calculus accumulation evident.   Radiolucent region posterior to #3 in area of #2 (missing),  bone loss appearing in an irregular fashion posterior to #3 distal root with ill-defined borders (consistent with patient's recent cancer dx and  surgery with flap reconstruction). 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   ASSESSMENT: 1. Cancer of the hard palate 2. Pre-radiation dental consultation 3. Mental retardation 4. CHF 5. Missing teeth 6. Caries 7. 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 Mandibular tori  9. Diastemas   PLAN/RECOMMENDATIONS: 1. I discussed the risks, benefits, and complications of various treatment options with the patient in relationship to his medical and dental conditions. We discussed various treatment options to include no treatment, multiple extractions with alveoloplasty, pre-prosthetic surgery as indicated, periodontal therapy, dental restorations, root canal therapy, crown and bridge therapy, implant therapy, and replacement of missing teeth as indicated.  Explained that one of his upper right molars has bone loss distal to the back most root which is likely due to his cancer and recent surgery.  Per the oral surgeon's note who did the flap reconstruction, he also had a backmost molar removed so this area may still be healing and appear strange on the radiograph.  I do not believe there is any indication to take this tooth out prior to radiation as this could potentially cause more issues throughout treatment if he is not healed or there are complications.   The patient/patient's legal guardian verbalized understanding of all options, and currently wishes to proceed with follow-up after radiation therapy and return to his regular dentist for routine dental care.  2.  Discussed in detail the effects of radiation and the mouth including xerostomia, radiation caries, dysgeusia, trismus and osteoradionecrosis.  Discussed the importance of beginning to brush 2x daily with a soft toothbrush and flossing.   Explained the importance of drinking only water once beginning radiation since there is no sugar and will help with dry mouth.  We will schedule a follow-up visit after he has completed XRT to see how he is doing and if he is having any significant side effects of therapy.  We will also prescribe fluoride for him to begin using fluoride trays.  3.  We took U/L impressions today to fabricate a tongue positioner to limit the exposure of his tongue to radiation.  We will schedule him to return prior to him starting his simulation.  4. Discussion of findings with medical team and coordination of future medical and dental care as needed.   I spent in excess of 120 minutes during the conduct of this consultation and >50% of this time involved direct face-to-face encounter for counseling and/or coordination of the patient's care.    Provider: Juliane Poot. Benson Norway, DMD

## 2020-05-05 NOTE — Patient Instructions (Signed)
[facility] Department of Dental Medicine Dr. Verlyn Lambert B. Dencil Cayson Phone: [phone] Fax: [phone]   It was a pleasure seeing you today!  Please refer to the information below regarding your dental visit with us, and call us should you have any questions or concerns that may come up after you leave.   Thank you for giving us the opportunity to provide care for you.  If there is anything we can do for you, please let us know.     RADIATION THERAPY AND INFORMATION REGARDING YOUR TEETH   [x]Xerostomia (Dry Mouth) Your salivary glands may be in the field of radiation.  Radiation may include all or only part of your salivary glands.  This will cause your saliva to dry up, and you will have a dry mouth.  The dry mouth will be for the rest of your life unless your radiation oncologist tells you otherwise.  Your saliva has many functions:  It wets your tongue for speaking.  It coats your teeth and the inside of your mouth for easier movement.  It helps with chewing and swallowing food.  It helps clean away harmful acid and toxic products made by the germs in your mouth, therefore it helps prevent cavities.  It kills some germs in your mouth and helps to prevent gum disease.  It helps to carry flavor to your taste buds.  Once you have lost your saliva you will be at higher risk for tooth decay and gum disease.    What can be done to help improve your mouth when there's not enough saliva: . Your dentist may give a prescription for Salagen.  It will not bring back all of your saliva but may bring back some of it.  Also, your saliva may be thick and ropy or white and foamy. It will not feel like it use to feel. . You will need to swish with water every time your mouth feels dry.  YOU CANNOT suck on any cough drops, mints, lemon drops, candy, vitamin C or any other products.  You cannot use anything other than water to make your mouth feel less dry.  If you want to drink anything else,  you have to drink it all at once and brush afterwards.  Be sure to discuss the details of your diet habits with your dentist or hygienist.  [x]Radiation caries: . This is decay (cavities) that happens very quickly once your mouth is very dry due to radiation therapy.  Normally, cavities take six months to two years to become a problem.  When you have dry mouth, cavities may take as little as eight weeks to cause you a problem.   . Dental check-ups every two months are necessary as long as you have a dry mouth. Radiation caries typically, but not always, start at your gum line where it is hard to see the cavity.  It is therefore also hard to fill these cavities adequately.  This high rate of cavities happens because your mouth no longer has saliva and therefore the acid made by the germs starts the decay process.  Whenever you eat anything the germs in your mouth change the food into acid.  The acid then burns a small hole in your tooth.  This small hole is the beginning of a cavity.  If this is not treated then it will grow bigger and become a cavity.  The way to avoid this hole getting bigger is to use fluoride every evening as prescribed by your   dentist following your radiation.   NOTE:  You have to make sure that your teeth are very clean before you use the fluoride.  This fluoride in turn will strengthen your teeth and prepare them for another day of fighting acid.  If you develop radiation caries many times, the damage is so large that you will have to have all your teeth removed.  This could be a big problem if some of these teeth are in the field of radiation.  Further details of why this could be a big problem will follow.  (See Osteoradionecrosis).  [x]Dysgeusia (Loss of Taste) This happens to varying degrees once you've had radiation therapy to your jaw region.  Many times taste is not completely lost, but becomes limited.  The loss of taste is mostly due to radiation affecting your taste buds.   However, if you have no saliva in your mouth to carry the flavor to your taste buds, it would be difficult for your taste buds to taste anything.  That is why using water or a prescription for Salagen prior to meals and during meal times may help with some of the taste.  Keep in mind that taste generally returns very slowly over the course of several months or several years after radiation therapy.  Don't give up hope.  [x]Trismus (Limited Jaw Opening) According to your Radiation Oncologist, your TMJ or jaw joints are going to be partially or fully in the field of radiation.  This means that over time the muscles that help you open and close your mouth may get stiff.  This will potentially result in your not being able to open your mouth wide enough or as wide as you can open it now.    Let me give you an example of how slowly this happens and how unaware people are of it:   A gentlemen that had radiation therapy two years ago came back to me complaining that bananas are just too large for him to be able to fit them in between his teeth.  He was not able to open wide enough to bite into a banana.  This happens slowly and over a period of time.  What we do to try and prevent this:   . Your dentist will probably give you a stack of sticks called a trismus exercise device.  This stack will help remind your muscles and your jaw joints to open up to the same distance every day.  Use these sticks every morning when you wake up, or according to the instructions given by your dentist.    . You must use these sticks for at least one to two years after radiation therapy.  The reason for that is because it happens so slowly and keeps going on for about two years after radiation therapy.  Your hospital dentist will help you monitor your mouth opening and make sure that it's not getting smaller after radiation.  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 This is a condition where your jaw bone after radiation therapy becomes  very dry.  It has very little blood supply to keep it alive.  If you develop a cavity that turns into an abscess or an infection, then the jaw bone does not have enough blood supply to help fight the infection.  At this point it is very likely that the infection could cause the death of your jaw bone.  When you have dead bone it has to be removed.  Therefore, you might end up having   to have surgery to remove part of your jaw bone, the part of the jaw bone that has been affected.    . Healing is also a problem if you are to have surgery (like a tooth extraction) in the areas where the bone has had radiation therapy.  If you have surgery, you need more blood supply to heal which is not available.  When blood supply and oxygen are not available, there is a chance for the bone to die. . Occasionally, ORN happens on its own with no obvious reason, but this is quite rare.  We believe that patients who continue to smoke and/or drink alcohol have a higher chance of having this problem. . Once your jaw bone has had radiation therapy, if there are any remaining teeth in that area, it is not recommended to have them pulled unless your dentist or oral surgeon is aware of your history of radiation and believes it is safe.  . The risks for ORN either from infection or spontaneously occurring (with no reason) are life long. 

## 2020-05-05 NOTE — Progress Notes (Signed)
54 year old male diagnosed with hard palate cancer. He will receive radiation therapy and is followed by [name].  Past medical history includes schizophrenia, recurrent colon obstruction, mental retardation, fecal incontinence, hyperlipidemia, constipation, and anal abscess.  Medications include multivitamin and Benefiber.  Labs were reviewed.  Height: 62 inches. Weight: 219.8 pounds. Usual body weight: 220 pounds. BMI: 28.22.  Patient presents to nutrition consult with his legal legal guardian/sister [name] and his caregiver. Patient has a very good appetite. He has no difficulties chewing and swallowing. Denies nausea, vomiting, constipation, and diarrhea. Patient has not been using a lot of dairy products but does enjoy yogurt. No weight loss noted.  Nutrition diagnosis:  Food and nutrition related knowledge deficit related to hard palate cancer and associated treatments as evidenced by no prior need for nutrition related information.  Intervention: Educated on strategies for increasing calories and protein in small frequent meals and snacks. Encouraged oral nutrition supplements twice a day between meals once radiation therapy is started and oral intake decreases. Provided 1 complementary case of Ensure Enlive. Provided fact sheets. Questions were answered. Teach back method used. Contact information provided.  Monitoring, evaluation, goals: Patient will tolerate adequate calories and protein for minimal weight loss throughout treatment.  Next visit: To be scheduled weekly with upcoming treatment.  **Disclaimer: This note was dictated with voice recognition software. Similar sounding words can inadvertently be transcribed and this note may contain transcription errors which may not have been corrected upon publication of note.**

## 2020-05-05 NOTE — Progress Notes (Signed)
See Consult Note from 10/6.

## 2020-05-07 ENCOUNTER — Ambulatory Visit: Payer: Medicare Other | Admitting: Radiation Oncology

## 2020-05-07 ENCOUNTER — Other Ambulatory Visit: Payer: Self-pay

## 2020-05-07 ENCOUNTER — Ambulatory Visit (HOSPITAL_COMMUNITY): Payer: Medicare Other | Admitting: Dentistry

## 2020-05-07 DIAGNOSIS — C050 Malignant neoplasm of hard palate: Secondary | ICD-10-CM

## 2020-05-07 NOTE — Progress Notes (Signed)
DENTAL VISIT    Patient Name: [name] Branch DOB:                        1965-10-10   MRN:     [id number]    CHIEF COMPLAINT:  Patient with no complaints.  Routine dental visit.  [name] presents today with his sister (legal guardian) for an appointment to continue with his bite block fabrication for anticipated XRT.  Vitals: BP 119/65 (BP Location: Right Arm)   Pulse 91   Temp 98.5 F (36.9 C)    Reviewed medical/dental history.  No changes reported.   Patient Active Problem List   Diagnosis Date Noted  . 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 Incontinent of feces   . Total self-care deficit   . S/P total colectomy 07/20/2017  . Dizziness 10/12/2016  . Anemia of chronic disease 10/05/2014  . Colonic inertia s/p abdominal colectomy 09/25/2014 09/25/2014  . Hyperlipidemia 09/17/2008  . Glaucoma 10/10/2006  . Sarcoidosis 06/11/2006  . Schizophrenia, unspecified type ([place]) 06/11/2006  . Mental retardation 06/11/2006  . Chronic systolic CHF (congestive heart failure) ([place]) 06/11/2006  . 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   Past Medical History:  Diagnosis Date  . ABSCESS, ANAL/RECTAL REGIONS 08/18/2006   Annotation: Fournier gangrene Qualifier: History of  By: Garnette Scheuermann MD, Arlee Muslim    . Cardiomyopathy   . Colonic inertia   . Constipation   . Glaucoma   . Hyperlipidemia   . Hypovolemic shock ([place])   . Incontinent of feces   . Mental retardation   . 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   Annotation: chronic with stercoral ulcer  Qualifier: Diagnosis of  By: Garnette Scheuermann MD, Arlee Muslim    . Obstruction of colon (HCC)    recurrent  . Sarcoidosis   . Schizophrenia ([place])   . 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 Total self-care deficit    per caregiver he needs to have help bathing and cleaning self and needs help with all daily needs now    Current Outpatient Medications  Medication Sig Dispense Refill  . AMBULATORY NON FORMULARY MEDICATION Place 1 drop into both eyes at bedtime.  Medication Name: genteal lubricant eye D 15 ML    . ammonium lactate (LAC-HYDRIN) 12 % lotion     . cholestyramine (QUESTRAN) 4 g packet USE 1 PACKET AS DIRECTED 3 TIMES DAILY WITH MEALS. (Patient not taking: Reported on 04/28/2020) 90 each 1  . cloZAPine (CLOZARIL) 100 MG tablet Take 100 mg by mouth 2 (two) times daily.    . cloZAPine (CLOZARIL) 25 MG tablet Take 125 mg by mouth 2 (two) times daily.     . dapagliflozin propanediol (FARXIGA) 10 MG TABS tablet Take 10 mg by mouth every morning.    . fluvoxaMINE (LUVOX) 100 MG tablet Take 300 mg by mouth at bedtime.    . hydrOXYzine (ATARAX/VISTARIL) 25 MG tablet 1 tablet by mouth   for agitation    . ketoconazole (NIZORAL) 2 % cream Apply 1 fingertip amount to each foot daily. 30 g 0  . latanoprost (XALATAN) 0.005 % ophthalmic solution Place 1 drop into both eyes at bedtime.    Marland Kitchen losartan (COZAAR) 25 MG tablet Take 25 mg by mouth every morning.    . metoprolol succinate (TOPROL-XL) 25 MG 24 hr tablet Take 25 mg by mouth every morning.    . mineral oil-hydrophilic petrolatum (AQUAPHOR) ointment Apply      apply to hands four times a day    .  Misc. Devices MISC Please provide patient with insurance approved incontinence garments/pads for incontinence of stool. 1 each 0  . Multiple Vitamin (MULTIVITAMIN) capsule Take 1 capsule by mouth daily. 90 capsule 3  . pravastatin (PRAVACHOL) 40 MG tablet TAKE ONE TABLET EACH DAY 90 tablet 3  . Travoprost, BAK Free, (TRAVATAN) 0.004 % SOLN ophthalmic solution Apply 1 drop to eye as needed.    . Wheat Dextrin (BENEFIBER) POWD Take 15 mLs by mouth 3 (three) times daily with meals. Benefiber 1 tablespoon tid with meals 1 Can 11   No current facility-administered medications for this visit.   Allergies  Allergen Reactions  . Hydrocodone Other (See Comments)    Exacerbation of urinary and fecal incontinence     PROCEDURE:  Appliances were tried in and adjusted as needed.  Lower occlusal plane fit nicely  into patient's mandibular arch with good retention.  Attempted to complete bite block of maxillary occlusal surfaces by having patient replicate his bite and stay open/close to a certain width without closing completely onto material, however pt had difficulty with this.   Able to get somewhat of an accurate bite, but did not have time to finish as well as show patient and guardian how to take the appliance in/out, and to let him practice/get comfortable with wearing it.   PLAN:  Recommend patient returning early next week to attempt to complete the appliance prior to his simulation appointment.    Radiation oncology team and patient's sister are aware that despite our best efforts, it may be that Renn will not be able to insert/remove the device on his own, or keep it steady in his mouth throughout his radiation treatment.    All questions/concerned were addressed, and the patient departed in stable condition.   Provider: Juliane Poot. Benson [place], DMD

## 2020-05-12 ENCOUNTER — Ambulatory Visit (HOSPITAL_COMMUNITY): Payer: Medicare Other | Admitting: Dentistry

## 2020-05-12 ENCOUNTER — Inpatient Hospital Stay: Payer: Medicare Other

## 2020-05-12 ENCOUNTER — Ambulatory Visit
Admission: RE | Admit: 2020-05-12 | Discharge: 2020-05-12 | Disposition: A | Discharge status: Home / Self Care | Payer: Medicare Other | Source: Ambulatory Visit | Attending: Radiation Oncology | Admitting: Radiation Oncology

## 2020-05-12 ENCOUNTER — Telehealth: Payer: Self-pay | Admitting: *Deleted

## 2020-05-12 ENCOUNTER — Other Ambulatory Visit: Payer: Self-pay

## 2020-05-12 DIAGNOSIS — Z23 Encounter for immunization: Secondary | ICD-10-CM | POA: Diagnosis not present

## 2020-05-12 DIAGNOSIS — C050 Malignant neoplasm of hard palate: Secondary | ICD-10-CM | POA: Diagnosis not present

## 2020-05-12 DIAGNOSIS — C058 Malignant neoplasm of overlapping sites of palate: Secondary | ICD-10-CM | POA: Insufficient documentation

## 2020-05-12 DIAGNOSIS — Z510 Encounter for antineoplastic radiation therapy: Secondary | ICD-10-CM | POA: Diagnosis not present

## 2020-05-12 DIAGNOSIS — Z01818 Encounter for other preprocedural examination: Secondary | ICD-10-CM | POA: Diagnosis not present

## 2020-05-12 NOTE — Progress Notes (Signed)
   Covid-19 Vaccination Clinic  Name:  Leigh Kaeding    MRN: [id number] DOB: 1966/03/21  05/12/2020  Mr. Keltz was observed post Covid-19 immunization for 15 minutes without incident. He was provided with Vaccine Information Sheet and instruction to access the V-Safe system.   Mr. Klarich was instructed to call 911 with any severe reactions post vaccine: Marland Kitchen Difficulty breathing  . Swelling of face and throat  . A fast heartbeat  . A bad rash all over body  . Dizziness and weakness

## 2020-05-12 NOTE — Progress Notes (Signed)
Oncology Nurse Navigator Documentation  To provide support, encouragement and care continuity, met with Mr. Sohn during his CT SIM. He was accompanied by his sister [name] and caregiver [name].  He tolerated procedure without difficulty, denied questions/concerns.   I toured him to [facility] 3 treatment area, explained procedures for lobby registration, arrival to Radiation Waiting, arrival to tmt area and preparation for tmt.  He, [name], and [place] voiced their understanding.   I spoke with them about seeing [name] SLP on 10/21 at 2:45pm for swallowing therapy evaluation before his first radiation the same day and they agreed.  I encouraged him to call me prior to 10/21/21New Start with any new concerns or questions.  Harlow Asa RN, BSN, OCN Head & Neck Oncology Nurse [place] at [facility] Phone # [phone]  Fax # [phone]

## 2020-05-12 NOTE — Progress Notes (Signed)
DENTAL VISIT   Patient Name:  [name] Branch DOB:                Feb 03, 1966 MRN:                            [id number]   CHIEF COMPLAINT: Patient with no complaints.  Routine dental visit.  [name] now presents for insertion of bite block/tongue positioner fabricated for radiation therapy.  Simulation is scheduled for this afternoon.  Vitals: BP 112/68 (BP Location: Right Arm)   Pulse 97   Temp 98.6 F (37 C)    Reviewed medical/dental history.  No changes reported.  Patient Active Problem List   Diagnosis Date Noted  . 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 Incontinent of feces   . Total self-care deficit   . S/P total colectomy 07/20/2017  . Dizziness 10/12/2016  . Anemia of chronic disease 10/05/2014  . Colonic inertia s/p abdominal colectomy 09/25/2014 09/25/2014  . Hyperlipidemia 09/17/2008  . Glaucoma 10/10/2006  . Sarcoidosis 06/11/2006  . Schizophrenia, unspecified type ([place]) 06/11/2006  . Mental retardation 06/11/2006  . Chronic systolic CHF (congestive heart failure) ([place]) 06/11/2006  . 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   Past Medical History:  Diagnosis Date  . ABSCESS, ANAL/RECTAL REGIONS 08/18/2006   Annotation: Fournier gangrene Qualifier: History of  By: Garnette Scheuermann MD, Arlee Muslim    . Cardiomyopathy   . Colonic inertia   . Constipation   . Glaucoma   . Hyperlipidemia   . Hypovolemic shock ([place])   . Incontinent of feces   . Mental retardation   . 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   Annotation: chronic with stercoral ulcer  Qualifier: Diagnosis of  By: Garnette Scheuermann MD, Arlee Muslim    . Obstruction of colon (HCC)    recurrent  . Sarcoidosis   . Schizophrenia ([place])   . 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 Total self-care deficit    per caregiver he needs to have help bathing and cleaning self and needs help with all daily needs now    Current Outpatient Medications  Medication Sig Dispense Refill  . AMBULATORY NON FORMULARY MEDICATION Place 1 drop into both eyes at  bedtime. Medication Name: genteal lubricant eye D 15 ML    . ammonium lactate (LAC-HYDRIN) 12 % lotion     . cholestyramine (QUESTRAN) 4 g packet USE 1 PACKET AS DIRECTED 3 TIMES DAILY WITH MEALS. (Patient not taking: Reported on 04/28/2020) 90 each 1  . cloZAPine (CLOZARIL) 100 MG tablet Take 100 mg by mouth 2 (two) times daily.    . cloZAPine (CLOZARIL) 25 MG tablet Take 125 mg by mouth 2 (two) times daily.     . dapagliflozin propanediol (FARXIGA) 10 MG TABS tablet Take 10 mg by mouth every morning.    . fluvoxaMINE (LUVOX) 100 MG tablet Take 300 mg by mouth at bedtime.    . hydrOXYzine (ATARAX/VISTARIL) 25 MG tablet 1 tablet by mouth   for agitation    . ketoconazole (NIZORAL) 2 % cream Apply 1 fingertip amount to each foot daily. 30 g 0  . latanoprost (XALATAN) 0.005 % ophthalmic solution Place 1 drop into both eyes at bedtime.    Marland Kitchen losartan (COZAAR) 25 MG tablet Take 25 mg by mouth every morning.    . metoprolol succinate (TOPROL-XL) 25 MG 24 hr tablet Take 25 mg by mouth every morning.    . mineral oil-hydrophilic petrolatum (AQUAPHOR) ointment Apply  apply to hands four times a day    . Misc. Devices MISC Please provide patient with insurance approved incontinence garments/pads for incontinence of stool. 1 each 0  . Multiple Vitamin (MULTIVITAMIN) capsule Take 1 capsule by mouth daily. 90 capsule 3  . pravastatin (PRAVACHOL) 40 MG tablet TAKE ONE TABLET EACH DAY 90 tablet 3  . Travoprost, BAK Free, (TRAVATAN) 0.004 % SOLN ophthalmic solution Apply 1 drop to eye as needed.    . Wheat Dextrin (BENEFIBER) POWD Take 15 mLs by mouth 3 (three) times daily with meals. Benefiber 1 tablespoon tid with meals 1 Can 11   No current facility-administered medications for this visit.   Allergies  Allergen Reactions  . Hydrocodone Other (See Comments)    Exacerbation of urinary and fecal incontinence    PROCEDURE: Appliances were tried in and adjusted as needed and polished. Instructions and  demonstrations were provided to the patient, caregiver and legal guardian on insertion and removal.  Legal guardian and caregiver verbalized understanding.  Appliance delivered to simulation by Leta Speller following today's visit.   Radiation oncology team and patient's sister are aware that despite our best efforts, it may be that Jahzir will not be able to insert/remove the device on his own, or keep it steady in his mouth throughout his radiation treatment.   PLAN: Patient to return 109mo following radiation therapy for a follow-up visit in our clinic, and then will return to his regular dentist for routine dental care.  Patient to call if questions or problems arise before then.   All questions/concerns were addressed and answered.  Patient tolerated procedure well.   Provider: Juliane Poot. Benson [place], DMD

## 2020-05-12 NOTE — Telephone Encounter (Signed)
Called patient to see if he would be willing to see the nutritionist weekly, patient agreed to do so.

## 2020-05-17 ENCOUNTER — Ambulatory Visit: Payer: Medicare Other | Admitting: Radiation Oncology

## 2020-05-18 ENCOUNTER — Ambulatory Visit: Payer: Medicare Other

## 2020-05-19 ENCOUNTER — Ambulatory Visit: Payer: Medicare Other

## 2020-05-19 DIAGNOSIS — C058 Malignant neoplasm of overlapping sites of palate: Secondary | ICD-10-CM | POA: Diagnosis not present

## 2020-05-20 ENCOUNTER — Ambulatory Visit: Payer: Medicare Other

## 2020-05-20 ENCOUNTER — Ambulatory Visit
Admission: RE | Admit: 2020-05-20 | Discharge: 2020-05-20 | Disposition: A | Discharge status: Home / Self Care | Payer: Medicare Other | Source: Ambulatory Visit | Attending: Radiation Oncology | Admitting: Radiation Oncology

## 2020-05-20 ENCOUNTER — Ambulatory Visit: Payer: Medicare Other | Attending: Radiation Oncology

## 2020-05-20 ENCOUNTER — Other Ambulatory Visit: Payer: Self-pay

## 2020-05-20 DIAGNOSIS — C058 Malignant neoplasm of overlapping sites of palate: Secondary | ICD-10-CM | POA: Diagnosis not present

## 2020-05-20 DIAGNOSIS — R1310 Dysphagia, unspecified: Secondary | ICD-10-CM | POA: Diagnosis present

## 2020-05-20 NOTE — Progress Notes (Signed)
Oncology Nurse Navigator Documentation  To provide support, encouragement and care continuity, met with [name] Branch and his caregiver, [name] Branch for his Speech Therapy appointment with [name] SLP and initial RT.    I reviewed the 2-step treatment process, answered questions.   [name] Branch completed treatment without difficulty, denied questions/concerns.  I reviewed the registration/arrival procedure for subsequent treatments.  I encouraged them to call me with questions/concerns as tmts proceed.   Harlow Asa RN, BSN, OCN Head & Neck Oncology Nurse [place] at [facility] Phone # [phone]  Fax # [phone]

## 2020-05-20 NOTE — Patient Instructions (Signed)
SWALLOWING EXERCISES Do these until 6 months after your last day of radiation, then 2-3 times per week afterwards  1. Effortful Swallows -Squeeze hard with the muscles in your neck while you swallow your saliva or a sip of water - Repeat 10-15 times, 2-3 times a day, and use whenever you eat or drink        2. Blowing exercise  - pretend like you are blowing out your birthday candles  - repeat 20 times, twice a day

## 2020-05-20 NOTE — Therapy (Signed)
[facility] [address] [place], [place], [address] Phone: [phone]   Fax:  [phone]  Speech Language Pathology Evaluation  Patient Details  Name: [name] Branch MRN: [id number] Date of Birth: September 29, 1965 Referring Provider (SLP): Eppie Gibson, MD   Encounter Date: 05/20/2020   End of Session - 05/20/20 1644    Visit Number 1    Number of Visits 7    Date for SLP Re-Evaluation 08/18/20    SLP Start Time 82    SLP Stop Time  1510    SLP Time Calculation (min) 40 min    Activity Tolerance Patient tolerated treatment well           Past Medical History:  Diagnosis Date  . ABSCESS, ANAL/RECTAL REGIONS 08/18/2006   Annotation: Fournier gangrene Qualifier: History of  By: Garnette Scheuermann MD, Arlee Muslim    . Cardiomyopathy   . Colonic inertia   . Constipation   . Glaucoma   . Hyperlipidemia   . Hypovolemic shock ([place])   . Incontinent of feces   . Mental retardation   . 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   Annotation: chronic with stercoral ulcer  Qualifier: Diagnosis of  By: Garnette Scheuermann MD, Arlee Muslim    . Obstruction of colon (HCC)    recurrent  . Sarcoidosis   . Schizophrenia ([place])   . 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 Total self-care deficit    per caregiver he needs to have help bathing and cleaning self and needs help with all daily needs now     Past Surgical History:  Procedure Laterality Date  . COLECTOMY N/A 09/25/2014   Procedure: OPEN ABDOMINAL COLECTOMY;  Surgeon: Michael Boston, MD;  Location: [facility] ORS;  Service: General;  Laterality: N/A;  . COLONOSCOPY  2007  . groin surgery     boil drained  . PROCTOSCOPY N/A 09/25/2014   Procedure: RIGID PROCTOSCOPY;  Surgeon: Michael Boston, MD;  Location: [facility];  Service: General;  Laterality: N/A;    There were no vitals filed for this visit.   Subjective Assessment - 05/20/20 1454    Subjective Pt accompanied by personal aide, [name] Branch.    Currently in Pain? No/denies               SLP Evaluation [facility] - 05/20/20 1633      SLP Visit Information   SLP Received On 05/20/20    Referring Provider (SLP) Eppie Gibson, MD    Onset Date July 2021    Medical Diagnosis cancer of hard/soft palate      Subjective   Patient/Family Stated Goal Pt affirmed he wanted to keep his swallowing as safe as possible, when SLP asked him yes/no question about this.      General Information   HPI Pt's DDS noted oral lesion on oral exam. [facility]NT at [facility] performed biopsy 02-05-20 showed low grade salivary gland ademocarcinomoa, hyalinizing clear cell carcinoma. 03-05-20 CT scan revelaed lesion at junction of hard and soft palate. Local resection 03-12-20 with buccinator myomucosal flap reconstruction. Post-sx it was recommended from [facility] that pt receive rad tx to tumor bed but family wanted to have tx closer to home so pt receiving rad tx at [facility]. Pt to begin rad tx today to rt palate and end 06-16-20.       Balance Screen   Has the patient fallen in the past 6 months No      Prior Functional Status   Cognitive/Linguistic Baseline Baseline deficits  Baseline deficit details memory, problem solving, safety awareness - pt requires 24/7 supervision      Cognition   Overall Cognitive Status History of cognitive impairments - at baseline      Auditory Comprehension   Overall Auditory Comprehension Appears within functional limits for tasks assessed   SLP simplified oral mech exam- pt completed with imitation     Verbal Expression   Overall Verbal Expression Appears within functional limits for tasks assessed      Oral Motor/Sensory Function   Overall Oral Motor/Sensory Function Appears within functional limits for tasks assessed      Motor Speech   Overall Motor Speech Impaired    Articulation Impaired   mild   Level of Impairment Phrase    Intelligibility Intelligible                           SLP Education - 05/20/20 1643    Education Details HEP  procedure, late effects head/neck radiaiton on swallwo ability, how to track HEP completion (spreadsheet with boxes)    Person(s) Educated Patient;Caregiver(s)    Methods Explanation;Demonstration;Verbal cues;Handout    Comprehension Verbalized understanding;Returned demonstration;Verbal cues required;Need further instruction            SLP Short Term Goals - 05/20/20 1648      SLP SHORT TERM GOAL #1   Title pt will complete HEP with imitation x2 sessions    Time 2    Period --   sessions, for all STGs   Status New      SLP SHORT TERM GOAL #2   Title pt or caregiver will describe 3 overt s/s aspiration PNA with modified independence    Time 2    Status New            SLP Long Term Goals - 05/20/20 1649      SLP LONG TERM GOAL #1   Title pt will complete HEP with imitation over three total visits    Time 4    Period --   visits, for all LTGs   Status New      SLP LONG TERM GOAL #2   Title to maximize pt's pulmonary safety, caregiver will describe how to modify HEP over time, and the timeline associated with reduction in HEP frequency with modified independence over two sessions    Time 6    Status New            Plan - 05/20/20 1644    Clinical Impression Statement At this time pt swallowing is deemed WNL/WFL with regular diet/thin liquids (ham sandwich and water tested today). SLP designed an individualized HEP for dysphagia and pt completed each exercise on their own with mod cues faded to modified indpendent. There are no overt s/s aspiration reported by pt or [name] Branch (caregiver) at this time. Data indicate that pt's swallow ability will likely decrease over the course of radiation therapy and could very well decline over time following conclusion of their radiation therapy due to muscle disuse atrophy and/or muscle fibrosis. Pt will cont to need to be seen by SLP in order to assess safety of PO intake, assess the need for recommending any objective swallow assessment,  and ensuring pt correctly completes the individualized HEP.    Speech Therapy Frequency --   once approx every four weeks   Duration --   7 visits   Treatment/Interventions Aspiration precaution training;Pharyngeal strengthening exercises;Compensatory techniques;Trials of upgraded texture/liquids;Diet toleration  management by SLP;Internal/external aids;Patient/family education;SLP instruction and feedback;Multimodal communcation approach    Potential to Achieve Goals Fair    Potential Considerations Cooperation/participation level;Previous level of function    SLP Home Exercise Plan provided    Consulted and Agree with Plan of Care Patient;Family member/caregiver   [name] Branch          Patient will benefit from skilled therapeutic intervention in order to improve the following deficits and impairments:   Dysphagia, unspecified type - Plan: SLP plan of care cert/re-cert    Problem List Patient Active Problem List   Diagnosis Date Noted  . 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 Incontinent of feces   . Total self-care deficit   . S/P total colectomy 07/20/2017  . Dizziness 10/12/2016  . Anemia of chronic disease 10/05/2014  . Colonic inertia s/p abdominal colectomy 09/25/2014 09/25/2014  . Hyperlipidemia 09/17/2008  . Glaucoma 10/10/2006  . Sarcoidosis 06/11/2006  . Schizophrenia, unspecified type ([place]) 06/11/2006  . Mental retardation 06/11/2006  . Chronic systolic CHF (congestive heart failure) ([place]) 06/11/2006  . 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    [facility] ,MS, CCC-SLP  05/20/2020, 4:52 PM  [facility] [address] [place], [place], [address] Phone: [phone]   Fax:  [phone]  Name: [name] Branch MRN: [id number] Date of Birth: 05/14/1966

## 2020-05-21 ENCOUNTER — Other Ambulatory Visit: Payer: Self-pay

## 2020-05-21 ENCOUNTER — Ambulatory Visit
Admission: RE | Admit: 2020-05-21 | Discharge: 2020-05-21 | Disposition: A | Discharge status: Home / Self Care | Payer: Medicare Other | Source: Ambulatory Visit | Attending: Radiation Oncology | Admitting: Radiation Oncology

## 2020-05-21 DIAGNOSIS — C058 Malignant neoplasm of overlapping sites of palate: Secondary | ICD-10-CM | POA: Diagnosis not present

## 2020-05-24 ENCOUNTER — Other Ambulatory Visit: Payer: Self-pay | Admitting: Radiation Oncology

## 2020-05-24 ENCOUNTER — Ambulatory Visit
Admission: RE | Admit: 2020-05-24 | Discharge: 2020-05-24 | Disposition: A | Discharge status: Home / Self Care | Payer: Medicare Other | Source: Ambulatory Visit | Attending: Radiation Oncology | Admitting: Radiation Oncology

## 2020-05-24 DIAGNOSIS — C058 Malignant neoplasm of overlapping sites of palate: Secondary | ICD-10-CM | POA: Diagnosis not present

## 2020-05-24 DIAGNOSIS — C050 Malignant neoplasm of hard palate: Secondary | ICD-10-CM

## 2020-05-24 MED ORDER — LIDOCAINE VISCOUS HCL 2 % MT SOLN: OROMUCOSAL | 3 refills | Status: DC

## 2020-05-24 MED ORDER — SONAFINE EX EMUL
1.0000 "application " | Freq: Two times a day (BID) | CUTANEOUS | Status: DC
  Administered 2020-05-24: 1 via TOPICAL

## 2020-05-24 NOTE — Progress Notes (Signed)
Pt here for patient teaching.  Due to his cognitive limitations, education reviewed with patient's sister and his caretaker.  Pt, pt's sister/caretaker given Radiation and You booklet, Managing Acute Radiation Side Effects for Head and Neck Cancer handout, skin care instructions and Sonafine.    Reviewed areas of pertinence such as fatigue, hair loss, mouth changes, skin changes, throat changes, earaches and taste changes .   Pt's sister/caretaker able to give teach back of to pat skin, use unscented/gentle soap and drink plenty of water,apply Sonafine bid, avoid applying anything to skin within 4 hours of treatment and to use an electric razor if they must shave.   Pt's sister/caretaker demonstrated understanding and verbalizes understanding of information given and will contact nursing with any questions or concerns.    [email or web address]

## 2020-05-25 ENCOUNTER — Ambulatory Visit
Admission: RE | Admit: 2020-05-25 | Discharge: 2020-05-25 | Disposition: A | Discharge status: Home / Self Care | Payer: Medicare Other | Source: Ambulatory Visit | Attending: Radiation Oncology | Admitting: Radiation Oncology

## 2020-05-25 ENCOUNTER — Inpatient Hospital Stay: Payer: Medicare Other

## 2020-05-25 DIAGNOSIS — C058 Malignant neoplasm of overlapping sites of palate: Secondary | ICD-10-CM | POA: Diagnosis not present

## 2020-05-25 NOTE — Progress Notes (Signed)
Nutrition Follow-up:  Patient with hard palate cancer. Patient is receiving radiation therapy and is followed by [name].    Met with patient, sister/guardian [name] and caregiver Heath Lark.  Heath Lark reports that appetite is the same.  Has slowed down how fast he eats.  Drinking 1 ensure enlive a day.  No problems chewing or swallowing.  Eats 3 meals per day.   Medications: reviewed  Labs: reviewed  Anthropometrics:   Weight 218 lb 6 oz today in RD office.    219 lb 8 oz on 10/6 UBW 220 lb   NUTRITION DIAGNOSIS: Food and nutrition related knowledge deficit improving   INTERVENTION:  Provided complimentary case of ensure enlive to patient today. Encouraged at least daily and up to 2 times per day once intake declines.   Continue high calorie, high protein foods.      MONITORING, EVALUATION, GOAL: weight trends, intake   NEXT VISIT: Wednesday, Nov 3rd with Jerrell Belfast B. Zenia Resides, [place], [place] Registered Dietitian [phone] (mobile)

## 2020-05-26 ENCOUNTER — Ambulatory Visit (INDEPENDENT_AMBULATORY_CARE_PROVIDER_SITE_OTHER): Payer: Medicare Other | Admitting: Nurse Practitioner

## 2020-05-26 ENCOUNTER — Encounter: Payer: Self-pay | Admitting: Nurse Practitioner

## 2020-05-26 ENCOUNTER — Ambulatory Visit
Admission: RE | Admit: 2020-05-26 | Discharge: 2020-05-26 | Disposition: A | Discharge status: Home / Self Care | Payer: Medicare Other | Source: Ambulatory Visit | Attending: Radiation Oncology | Admitting: Radiation Oncology

## 2020-05-26 VITALS — BP 130/70 | HR 100 | Ht 73.5 in | Wt 221.4 lb

## 2020-05-26 DIAGNOSIS — R159 Full incontinence of feces: Secondary | ICD-10-CM

## 2020-05-26 DIAGNOSIS — R152 Fecal urgency: Secondary | ICD-10-CM | POA: Diagnosis not present

## 2020-05-26 DIAGNOSIS — K5909 Other constipation: Secondary | ICD-10-CM

## 2020-05-26 DIAGNOSIS — C058 Malignant neoplasm of overlapping sites of palate: Secondary | ICD-10-CM | POA: Diagnosis not present

## 2020-05-26 MED ORDER — POLYETHYLENE GLYCOL 3350 17 GM/SCOOP PO POWD: ORAL | 3 refills | Status: DC

## 2020-05-26 MED ORDER — CHOLESTYRAMINE 4 G PO PACK: PACK | ORAL | 1 refills | Status: DC

## 2020-05-26 NOTE — Patient Instructions (Signed)
Continue using Miralax as needed.   Continue using Questran 4g packet as needed.   We have sent the following medications to your pharmacy for you to pick up at your convenience: Miralax and Lucrezia Starch   If you are age 53 or older, your body mass index should be between 23-30. Your Body mass index is 28.81 kg/m. If this is out of the aforementioned range listed, please consider follow up with your Primary Care Provider.  If you are age 58 or younger, your body mass index should be between 19-25. Your Body mass index is 28.81 kg/m. If this is out of the aformentioned range listed, please consider follow up with your Primary Care Provider.    Thank you for choosing me and [facility].  Tye Savoy NP

## 2020-05-26 NOTE — Progress Notes (Signed)
ASSESSMENT AND PLAN    # 54 yo male with history of chronic colonic inertia, status post total colectomy with ileorectal anastomosis in 2016. Caregiver managing bowel movements with questran as needed.  Caregiver here to get a plan in place in case patient becomes constipated.  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 Right now pain managed with Tylenol --Averaging 5 or so loose bowel movements a day which is acceptable given his history of colectomy.  Caregiver gives patient Lucrezia Starch on an as needed basis --In the future if patient becomes constipated on pain medication caregiver should hold  Questran.  She can give MiraLAX 1 capful in 8 ounces of water daily as needed.   --Caregiver to call us as needed for help with bowel management --Poor prep on 2019 Flexible sigmoidoscopy, ? Repeat at some point.   # New diagnosis of right salivary gland cancer, s/p resection. Undergoing XRT.   HISTORY OF PRESENT ILLNESS     Primary Gastroenterologist : Harl Bowie, MD  Chief Complaint :none.   Cortney Mckinney is a 54 y.o. male with PMH / [place] significant for,  but not necessarily limited to: Sarcoidosis, schizophrenia, mental retardation, CHF, chronic colonic inertia, status post total colectomy with ileorectal anastomosis in 2016, glaucoma, salivary gland cancer  Patient is here with his caregiver.Over the summer his dentist found a right palate lesion. Biopsy positive for clear cell carcinoma. Imaging negative for metastatic disease.  Lesion was excised in August at [facility]. He has been undergoing radiation treatments.   We have followed patient for diarrhea / fecal incontinence treated with Questran and Benefiber.  Caregiver called the office mid September as patient wasn't moving his bowels.  We advised to hold Questran and if still no BM then recommended MiraLAX bowel purge. Advised  to resume Questran when he started having bowel movements again.  Interval History:  Per caregiver patient started eating more fruits and vegetables.and bowel movements returned to normal.  He is averaging about 5 loose BMs a day.. Currently only taking Questran as needed. Since stools are always loose caregiver bases need for Questran on the frequency of bowel movements.  Caregiver told by Oncologist that with radiation treatments patient may get mouth pain at some point and may require narcotics. Right now any pain is being managed with tylenol. Caregiver worried about constipation and wants to have a plan in place.     Previous Endoscopic Evaluations / Pertinent Studies:  September 2019 flexible sigmoidoscopy --Suboptimal due to poor prep --Large amount of stool in the rectum --Erythema and friability of the mucosa at the ileocolonic anastomosis.  Biopsy showed mild focal active colitis, nonspecific.  No chronic changes.   Past Medical History:  Diagnosis Date  . ABSCESS, ANAL/RECTAL REGIONS 08/18/2006   Annotation: Fournier gangrene Qualifier: History of  By: Garnette Scheuermann MD, Arlee Muslim    . Cardiomyopathy   . Colonic inertia   . Constipation   . Glaucoma   . Hyperlipidemia   . Hypovolemic shock ([place])   . Incontinent of feces   . Mental retardation   . 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   Annotation: chronic with stercoral ulcer  Qualifier: Diagnosis of  By: Garnette Scheuermann MD, Arlee Muslim    . Obstruction of colon (HCC)    recurrent  . Sarcoidosis   . Schizophrenia ([place])   . 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 Total self-care deficit  per caregiver he needs to have help bathing and cleaning self and needs help with all daily needs now     Current Medications, Allergies, Past Surgical History, Family History and Social History were reviewed in [employer] record.   Current Outpatient Medications  Medication Sig Dispense Refill  . AMBULATORY NON FORMULARY MEDICATION Place 1 drop into  both eyes at bedtime. Medication Name: genteal lubricant eye D 15 ML    . ammonium lactate (LAC-HYDRIN) 12 % lotion     . cholestyramine (QUESTRAN) 4 g packet USE 1 PACKET AS DIRECTED 3 TIMES DAILY WITH MEALS. 90 each 1  . cloZAPine (CLOZARIL) 100 MG tablet Take 100 mg by mouth 2 (two) times daily.    . cloZAPine (CLOZARIL) 25 MG tablet Take 125 mg by mouth 2 (two) times daily.     . dapagliflozin propanediol (FARXIGA) 10 MG TABS tablet Take 10 mg by mouth every morning.    . fluvoxaMINE (LUVOX) 100 MG tablet Take 300 mg by mouth at bedtime.    . hydrOXYzine (ATARAX/VISTARIL) 25 MG tablet 1 tablet by mouth   for agitation    . ketoconazole (NIZORAL) 2 % cream Apply 1 fingertip amount to each foot daily. 30 g 0  . latanoprost (XALATAN) 0.005 % ophthalmic solution Place 1 drop into both eyes at bedtime.    . lidocaine (XYLOCAINE) 2 % solution Patient: Mix 1part 2% viscous lidocaine, 1part H20. Swish and swallow 90mL of diluted mixture, 77min before meals and at bedtime, up to QID 200 mL 3  . losartan (COZAAR) 25 MG tablet Take 25 mg by mouth every morning.    . metoprolol succinate (TOPROL-XL) 25 MG 24 hr tablet Take 25 mg by mouth every morning.    . mineral oil-hydrophilic petrolatum (AQUAPHOR) ointment Apply      apply to hands four times a day    . Misc. Devices MISC Please provide patient with insurance approved incontinence garments/pads for incontinence of stool. 1 each 0  . Multiple Vitamin (MULTIVITAMIN) capsule Take 1 capsule by mouth daily. 90 capsule 3  . pravastatin (PRAVACHOL) 40 MG tablet TAKE ONE TABLET EACH DAY 90 tablet 3  . Travoprost, BAK Free, (TRAVATAN) 0.004 % SOLN ophthalmic solution Apply 1 drop to eye as needed.    . Wheat Dextrin (BENEFIBER) POWD Take 15 mLs by mouth 3 (three) times daily with meals. Benefiber 1 tablespoon tid with meals 1 Can 11   No current facility-administered medications for this visit.    Review of Systems: No chest pain. No shortness of breath.  No urinary complaints.   PHYSICAL EXAM :    Wt Readings from Last 3 Encounters:  05/26/20 221 lb 6 oz (100.4 kg)  05/05/20 219 lb 12.8 oz (99.7 kg)  06/06/18 222 lb (100.7 kg)    BP 130/70 (BP Location: Left Arm, Patient Position: Sitting, Cuff Size: Normal)   Pulse 100   Ht 6' 1.5" (1.867 m) Comment: height measured without shoes  Wt 221 lb 6 oz (100.4 kg)   BMI 28.81 kg/m  Constitutional:  Pleasant male in no acute distress. Psychiatric: Normal mood and affect. Behavior is normal. EENT: Pupils normal.  Conjunctivae are normal. No scleral icterus. Neck supple.  Cardiovascular: Normal rate, regular rhythm. No edema Pulmonary/chest: Effort normal and breath sounds normal. No wheezing, rales or rhonchi. Abdominal: Soft, nondistended, nontender. Bowel sounds active throughout. There are no masses palpable. No hepatomegaly. Skin: Skin is warm and dry. No rashes noted.  I  spent 30 minutes total reviewing records, obtaining history, performing exam, counseling patient and documenting visit / findings.    Tye Savoy, NP  05/26/2020, 11:20 AM

## 2020-05-27 ENCOUNTER — Ambulatory Visit
Admission: RE | Admit: 2020-05-27 | Discharge: 2020-05-27 | Disposition: A | Discharge status: Home / Self Care | Payer: Medicare Other | Source: Ambulatory Visit | Attending: Radiation Oncology | Admitting: Radiation Oncology

## 2020-05-27 ENCOUNTER — Other Ambulatory Visit: Payer: Self-pay

## 2020-05-27 DIAGNOSIS — C058 Malignant neoplasm of overlapping sites of palate: Secondary | ICD-10-CM | POA: Diagnosis not present

## 2020-05-28 ENCOUNTER — Ambulatory Visit
Admission: RE | Admit: 2020-05-28 | Discharge: 2020-05-28 | Disposition: A | Discharge status: Home / Self Care | Payer: Medicare Other | Source: Ambulatory Visit | Attending: Radiation Oncology | Admitting: Radiation Oncology

## 2020-05-28 DIAGNOSIS — C058 Malignant neoplasm of overlapping sites of palate: Secondary | ICD-10-CM | POA: Diagnosis not present

## 2020-05-31 ENCOUNTER — Other Ambulatory Visit: Payer: Self-pay

## 2020-05-31 ENCOUNTER — Ambulatory Visit
Admission: RE | Admit: 2020-05-31 | Discharge: 2020-05-31 | Disposition: A | Discharge status: Home / Self Care | Payer: Medicare Other | Source: Ambulatory Visit | Attending: Radiation Oncology | Admitting: Radiation Oncology

## 2020-05-31 DIAGNOSIS — C058 Malignant neoplasm of overlapping sites of palate: Secondary | ICD-10-CM | POA: Insufficient documentation

## 2020-05-31 DIAGNOSIS — Z510 Encounter for antineoplastic radiation therapy: Secondary | ICD-10-CM | POA: Insufficient documentation

## 2020-06-01 ENCOUNTER — Ambulatory Visit
Admission: RE | Admit: 2020-06-01 | Discharge: 2020-06-01 | Disposition: A | Discharge status: Home / Self Care | Payer: Medicare Other | Source: Ambulatory Visit | Attending: Radiation Oncology | Admitting: Radiation Oncology

## 2020-06-01 ENCOUNTER — Other Ambulatory Visit: Payer: Self-pay

## 2020-06-01 DIAGNOSIS — C058 Malignant neoplasm of overlapping sites of palate: Secondary | ICD-10-CM | POA: Diagnosis not present

## 2020-06-02 ENCOUNTER — Ambulatory Visit
Admission: RE | Admit: 2020-06-02 | Discharge: 2020-06-02 | Disposition: A | Discharge status: Home / Self Care | Payer: Medicare Other | Source: Ambulatory Visit | Attending: Radiation Oncology | Admitting: Radiation Oncology

## 2020-06-02 ENCOUNTER — Telehealth: Payer: Self-pay | Admitting: Nutrition

## 2020-06-02 ENCOUNTER — Inpatient Hospital Stay: Payer: Medicare Other | Attending: Radiation Oncology | Admitting: Nutrition

## 2020-06-02 DIAGNOSIS — C058 Malignant neoplasm of overlapping sites of palate: Secondary | ICD-10-CM | POA: Diagnosis not present

## 2020-06-02 NOTE — Telephone Encounter (Signed)
Plan family/caregiver requested phone follow-ups until end of treatment.  I contacted [name] at given number.  Weight improved and was documented as 226 pounds on November 1 increased from 219 pounds 8 ounces on October 6.  [name] denies patient is having any difficulties with oral intake.  She states she has him on the schedule and he is eating well.  He rinses his mouth out several times during the day.  He drinks 2 oral nutrition supplements.  She has no questions or concerns.  Nutrition diagnosis: Food and nutrition related knowledge deficit improved.  Intervention: Encourage continued oral intake to meet needs for weight stabilization.  I encouraged caregiver to contact me with questions or concerns.   Monitoring, evaluation, goals: Adequate oral intake for weight maintenance.   Next visit: Thursday, November 11 by telephone.  **Disclaimer: This note was dictated with voice recognition software. Similar sounding words can inadvertently be transcribed and this note may contain transcription errors which may not have been corrected upon publication of note.**

## 2020-06-02 NOTE — Progress Notes (Signed)
Reviewed and agree with documentation and assessment and plan. K. Veena Glynn Yepes , MD   

## 2020-06-03 ENCOUNTER — Ambulatory Visit
Admission: RE | Admit: 2020-06-03 | Discharge: 2020-06-03 | Disposition: A | Discharge status: Home / Self Care | Payer: Medicare Other | Source: Ambulatory Visit | Attending: Radiation Oncology | Admitting: Radiation Oncology

## 2020-06-03 DIAGNOSIS — C058 Malignant neoplasm of overlapping sites of palate: Secondary | ICD-10-CM | POA: Diagnosis not present

## 2020-06-04 ENCOUNTER — Other Ambulatory Visit: Payer: Self-pay

## 2020-06-04 ENCOUNTER — Ambulatory Visit
Admission: RE | Admit: 2020-06-04 | Discharge: 2020-06-04 | Disposition: A | Discharge status: Home / Self Care | Payer: Medicare Other | Source: Ambulatory Visit | Attending: Radiation Oncology | Admitting: Radiation Oncology

## 2020-06-04 DIAGNOSIS — C058 Malignant neoplasm of overlapping sites of palate: Secondary | ICD-10-CM | POA: Diagnosis not present

## 2020-06-07 ENCOUNTER — Ambulatory Visit
Admission: RE | Admit: 2020-06-07 | Discharge: 2020-06-07 | Disposition: A | Discharge status: Home / Self Care | Payer: Medicare Other | Source: Ambulatory Visit | Attending: Radiation Oncology | Admitting: Radiation Oncology

## 2020-06-07 DIAGNOSIS — C058 Malignant neoplasm of overlapping sites of palate: Secondary | ICD-10-CM | POA: Diagnosis not present

## 2020-06-08 ENCOUNTER — Other Ambulatory Visit: Payer: Self-pay

## 2020-06-08 ENCOUNTER — Ambulatory Visit
Admission: RE | Admit: 2020-06-08 | Discharge: 2020-06-08 | Disposition: A | Discharge status: Home / Self Care | Payer: Medicare Other | Source: Ambulatory Visit | Attending: Radiation Oncology | Admitting: Radiation Oncology

## 2020-06-08 DIAGNOSIS — C058 Malignant neoplasm of overlapping sites of palate: Secondary | ICD-10-CM | POA: Diagnosis not present

## 2020-06-09 ENCOUNTER — Ambulatory Visit
Admission: RE | Admit: 2020-06-09 | Discharge: 2020-06-09 | Disposition: A | Discharge status: Home / Self Care | Payer: Medicare Other | Source: Ambulatory Visit | Attending: Radiation Oncology | Admitting: Radiation Oncology

## 2020-06-09 DIAGNOSIS — C058 Malignant neoplasm of overlapping sites of palate: Secondary | ICD-10-CM | POA: Diagnosis not present

## 2020-06-10 ENCOUNTER — Telehealth: Payer: Self-pay | Admitting: Nutrition

## 2020-06-10 ENCOUNTER — Other Ambulatory Visit: Payer: Self-pay

## 2020-06-10 ENCOUNTER — Ambulatory Visit
Admission: RE | Admit: 2020-06-10 | Discharge: 2020-06-10 | Disposition: A | Discharge status: Home / Self Care | Payer: Medicare Other | Source: Ambulatory Visit | Attending: Radiation Oncology | Admitting: Radiation Oncology

## 2020-06-10 ENCOUNTER — Inpatient Hospital Stay: Payer: Medicare Other | Admitting: Nutrition

## 2020-06-10 DIAGNOSIS — C058 Malignant neoplasm of overlapping sites of palate: Secondary | ICD-10-CM | POA: Diagnosis not present

## 2020-06-10 NOTE — Progress Notes (Signed)
See telephone note.

## 2020-06-10 NOTE — Telephone Encounter (Signed)
Nutrition follow-up completed with patient's caregiver at provided number. She reports patient continues to be consistent with oral intake and mouth care.  He is rinsing his mouth out several times a day.  He drinks his boost as provided.  Weight is documented as 219.2 pounds November 8 which is stable from 219 pounds October 6. Patient is consistent with practicing swallowing exercises she has no questions or concerns.  Nutrition diagnosis: Food and nutrition related knowledge deficit resolved.  Intervention: Continue strategies for adequate calorie and protein intake for weight maintenance. Caregiver to contact me with questions or concerns.  Monitoring, evaluation, goals: Will monitor for adequate oral intake and weight maintenance.  Next visit: Wednesday, November 17.  **Disclaimer: This note was dictated with voice recognition software. Similar sounding words can inadvertently be transcribed and this note may contain transcription errors which may not have been corrected upon publication of note.**

## 2020-06-11 ENCOUNTER — Ambulatory Visit: Payer: Medicare Other

## 2020-06-11 ENCOUNTER — Ambulatory Visit
Admission: RE | Admit: 2020-06-11 | Discharge: 2020-06-11 | Disposition: A | Discharge status: Home / Self Care | Payer: Medicare Other | Source: Ambulatory Visit | Attending: Radiation Oncology | Admitting: Radiation Oncology

## 2020-06-11 DIAGNOSIS — C058 Malignant neoplasm of overlapping sites of palate: Secondary | ICD-10-CM | POA: Diagnosis not present

## 2020-06-14 ENCOUNTER — Ambulatory Visit
Admission: RE | Admit: 2020-06-14 | Discharge: 2020-06-14 | Disposition: A | Discharge status: Home / Self Care | Payer: Medicare Other | Source: Ambulatory Visit | Attending: Radiation Oncology | Admitting: Radiation Oncology

## 2020-06-14 ENCOUNTER — Other Ambulatory Visit: Payer: Self-pay

## 2020-06-14 DIAGNOSIS — C058 Malignant neoplasm of overlapping sites of palate: Secondary | ICD-10-CM | POA: Diagnosis not present

## 2020-06-15 ENCOUNTER — Ambulatory Visit
Admission: RE | Admit: 2020-06-15 | Discharge: 2020-06-15 | Disposition: A | Discharge status: Home / Self Care | Payer: Medicare Other | Source: Ambulatory Visit | Attending: Radiation Oncology | Admitting: Radiation Oncology

## 2020-06-15 DIAGNOSIS — C058 Malignant neoplasm of overlapping sites of palate: Secondary | ICD-10-CM | POA: Diagnosis not present

## 2020-06-16 ENCOUNTER — Inpatient Hospital Stay: Payer: Medicare Other | Admitting: Nutrition

## 2020-06-16 ENCOUNTER — Ambulatory Visit
Admission: RE | Admit: 2020-06-16 | Discharge: 2020-06-16 | Disposition: A | Discharge status: Home / Self Care | Payer: Medicare Other | Source: Ambulatory Visit | Attending: Radiation Oncology | Admitting: Radiation Oncology

## 2020-06-16 ENCOUNTER — Ambulatory Visit: Payer: Medicare Other

## 2020-06-16 ENCOUNTER — Telehealth: Payer: Self-pay | Admitting: Nutrition

## 2020-06-16 ENCOUNTER — Encounter: Payer: Self-pay | Admitting: Radiation Oncology

## 2020-06-16 DIAGNOSIS — C058 Malignant neoplasm of overlapping sites of palate: Secondary | ICD-10-CM | POA: Diagnosis not present

## 2020-06-16 NOTE — Progress Notes (Signed)
Nutrition Follow-up:   Nutrition follow-up completed with patient's caregiver and sister and patient.  Patient last day of radiation is today.    Patient has had no difficulty eating.  Appetite is good and denies any nutrition impact symptoms.  He is drinking boost/ensure shakes 2 times per day.     Anthropometrics:   Weight is 221 lb increased from 219 lb on Oct 6.    UBW 220 lb prior to starting treatment   NUTRITION DIAGNOSIS: Food and nutrition related knowledge deficit resolved   INTERVENTION:  Provided another case of ensure plus to patient today.  If appetite is good and patient is maintaining weight can cut back on shakes.   Caregiver concerned about patient has been consuming more high fat foods and does not want him to be consuming them over the long term but more healthy options.  We discussed gradual transition back to more heart healthy eating pattern.   Caregiver has RD contact information and will contact with questions or concerns in the future    NEXT VISIT: no follow-up  [name] Branch, [place], [place] Registered Dietitian [phone] (mobile)

## 2020-06-16 NOTE — Progress Notes (Signed)
Oncology Nurse Navigator Documentation  Met with [name] Branch and his caregiver, [name] after final RT to offer support and to celebrate end of radiation treatment.   Provided verbal/written post-RT guidance:  Importance of keeping all follow-up appts, especially those with Nutrition and SLP.  Importance of protecting treatment area from sun.  Continuation of Sonafine application 2-3 times daily, application of antibiotic ointment to areas of raw skin; when supply of Sonafine exhausted transition to OTC lotion with vitamin E. Provided/reviewed Epic calendar of upcoming appts. Explained my role as navigator will continue for several more months, encouraged him to call me with needs/concerns.    Harlow Asa RN, BSN, OCN Head & Neck Oncology Nurse [place] [facility] Phone # [phone]  Fax # [phone]

## 2020-06-16 NOTE — Telephone Encounter (Signed)
Received note to contact [name] Branch on mobile number. I called several times. There was no answer and the voice mail box was full. Patient was suppose to have a nutrition follow up at 2:30 today. He has his final radiation at 3:30. At present time (3:07 pm) patient has not shown up for appointment.

## 2020-06-17 ENCOUNTER — Ambulatory Visit: Payer: Medicare Other

## 2020-06-17 ENCOUNTER — Ambulatory Visit: Payer: Medicare Other | Attending: Radiation Oncology

## 2020-06-17 ENCOUNTER — Other Ambulatory Visit: Payer: Self-pay

## 2020-06-17 DIAGNOSIS — R1310 Dysphagia, unspecified: Secondary | ICD-10-CM | POA: Diagnosis not present

## 2020-06-17 NOTE — Therapy (Signed)
[facility] [address] [place], [place], [address] Phone: [phone]   Fax:  [phone]  Speech Language Pathology Treatment  Patient Details  Name: [name] Branch MRN: [id number] Date of Birth: 1966/01/27 Referring Provider (SLP): Eppie Gibson, MD   Encounter Date: 06/17/2020   End of Session - 06/17/20 1644    Visit Number 2    Number of Visits 7    Date for SLP Re-Evaluation 08/18/20    SLP Start Time 1404    SLP Stop Time  1430    SLP Time Calculation (min) 26 min    Activity Tolerance Patient tolerated treatment well           Past Medical History:  Diagnosis Date   ABSCESS, ANAL/RECTAL REGIONS 08/18/2006   Annotation: Fournier gangrene Qualifier: History of  By: Garnette Scheuermann MD, Yogesh     Cardiomyopathy    Colonic inertia    Constipation    Glaucoma    Hyperlipidemia    Hypovolemic shock ([place])    Incontinent of feces    Mental retardation    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   Annotation: chronic with stercoral ulcer  Qualifier: Diagnosis of  By: Garnette Scheuermann MD, Yogesh     Obstruction of colon ([place])    recurrent   Sarcoidosis    Schizophrenia ([place])    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   Total self-care deficit    per caregiver he needs to have help bathing and cleaning self and needs help with all daily needs now     Past Surgical History:  Procedure Laterality Date   COLECTOMY N/A 09/25/2014   Procedure: OPEN ABDOMINAL COLECTOMY;  Surgeon: Michael Boston, MD;  Location: [facility];  Service: General;  Laterality: N/A;   COLONOSCOPY  2007   groin surgery     boil drained   PROCTOSCOPY N/A 09/25/2014   Procedure: RIGID PROCTOSCOPY;  Surgeon: Michael [place], MD;  Location: [facility];  Service: General;  Laterality: N/A;    There were no vitals filed for this visit.   Subjective Assessment - 06/17/20 1640    Subjective Pt accompanied by personal aide, [name] and guardian.    Currently in Pain?  No/denies                 ADULT SLP TREATMENT - 06/17/20 1640      General Information   Behavior/Cognition Alert;Cooperative;Pleasant mood;Confused;Requires cueing      Treatment Provided   Treatment provided Dysphagia      Dysphagia Treatment   Temperature Spikes Noted No    Respiratory Status Room air    Treatment Methods Skilled observation;Therapeutic exercise;Patient/caregiver education    Patient observed directly with PO's Yes    Type of PO's observed Regular;Thin liquids    Feeding Able to feed self    Liquids provided via Cup    Oral Phase Signs & Symptoms --   none noted   Pharyngeal Phase Signs & Symptoms --   none noted   Other treatment/comments Pt indepenently performed both exercises with excellent success. [name] states pt eats everything that is provided to him.  SLP educated pt/[name] on incr'ing reps if pt can perform HEP without difficulty whatsoever. Aid voiced understanding.       Assessment / Recommendations / Plan   Plan Continue with current plan of care      Progression Toward Goals   Progression toward goals Progressing toward goals  SLP Education - 06/17/20 1643    Education Details incr'ing difiiculty of HEP    Person(s) Educated Patient;Caregiver(s)    Methods Explanation    Comprehension Verbalized understanding;Need further instruction   pt needs more instruction           SLP Short Term Goals - 06/17/20 1646      SLP SHORT TERM GOAL #1   Title pt will complete HEP with imitation x2 sessions    Baseline 06-17-20    Time 1    Period --   sessions, for all STGs   Status On-going      SLP SHORT TERM GOAL #2   Title pt or caregiver will describe 3 overt s/s aspiration PNA with modified independence    Time 1    Status On-going            SLP Long Term Goals - 06/17/20 1646      SLP LONG TERM GOAL #1   Title pt will complete HEP with imitation over three total visits    Baseline 06-17-20    Time 3     Period --   visits, for all LTGs   Status Revised      SLP LONG TERM GOAL #2   Title to maximize pt's pulmonary safety, caregiver will describe how to modify HEP over time, and the timeline associated with reduction in HEP frequency with modified independence over two sessions    Time 5    Status On-going            Plan - 06/17/20 1645    Clinical Impression Statement At this time pt swallowing continues WNL/WFL with regular diet/thin liquids (ham sandwich and water tested today). SLP designed an individualized HEP for dysphagia and pt completed each exercise with indpendence. There are no overt s/s aspiration reported by pt or [name] (caregiver) at this time. Data indicate that pt's swallow ability will likely decrease over the course of radiation therapy and could very well decline over time following conclusion of their radiation therapy due to muscle disuse atrophy and/or muscle fibrosis. Pt will cont to need to be seen by SLP in order to assess safety of PO intake, assess the need for recommending any objective swallow assessment, and ensuring pt correctly completes the individualized HEP.    Speech Therapy Frequency --   once approx every four weeks   Duration --   7 visits   Treatment/Interventions Aspiration precaution training;Pharyngeal strengthening exercises;Compensatory techniques;Trials of upgraded texture/liquids;Diet toleration management by SLP;Internal/external aids;Patient/family education;SLP instruction and feedback;Multimodal communcation approach    Potential to Achieve Goals Fair    Potential Considerations Cooperation/participation level;Previous level of function    SLP Home Exercise Plan provided    Consulted and Agree with Plan of Care Patient;Family member/caregiver   [name]          Patient will benefit from skilled therapeutic intervention in order to improve the following deficits and impairments:   Dysphagia, unspecified type    Problem List Patient  Active Problem List   Diagnosis Date Noted   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/03/2020   Incontinent of feces    Total self-care deficit    S/P total colectomy 07/20/2017   Dizziness 10/12/2016   Anemia of chronic disease 10/05/2014   Colonic inertia s/p abdominal colectomy 09/25/2014 09/25/2014   Hyperlipidemia 09/17/2008   Glaucoma 10/10/2006   Sarcoidosis 06/11/2006   Schizophrenia, unspecified type ([place]) 06/11/2006   Mental retardation 41/32/4401   Chronic systolic  CHF (congestive heart failure) ([place]) 06/11/2006   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    [facility] ,[place], CCC-SLP  06/17/2020, 4:47 PM  [facility] [address] [place], [place], [address] Phone: [phone]   Fax:  [phone]   Name: Budd Freiermuth MRN: [id number] Date of Birth: 1966/01/05

## 2020-07-06 ENCOUNTER — Telehealth: Payer: Self-pay | Admitting: Nurse Practitioner

## 2020-07-06 DIAGNOSIS — R152 Fecal urgency: Secondary | ICD-10-CM

## 2020-07-06 DIAGNOSIS — R159 Full incontinence of feces: Secondary | ICD-10-CM

## 2020-07-06 NOTE — Telephone Encounter (Signed)
Please advise 

## 2020-07-06 NOTE — Telephone Encounter (Signed)
Inbound call from patient's care taker, [name] stating Questran rx needs to be changed to PRN; patient does not use it daily and pharmacy still keeps refilling it.

## 2020-07-07 NOTE — Telephone Encounter (Signed)
Estill Bamberg, please change prescription to 1-2 times daily as needed thanks

## 2020-07-08 ENCOUNTER — Telehealth: Payer: Self-pay | Admitting: Nurse Practitioner

## 2020-07-08 NOTE — Telephone Encounter (Signed)
Pharmacy called requesting order to discontinue Polyethylene glycol powder

## 2020-07-08 NOTE — Progress Notes (Signed)
[name] presents today for 2 week follow-up after completing radiation to his palate on 06/16/2020  Pain issues, if any: Patient denies (Caregiver also affirms that patient doesn't mention pain while at home) Using a feeding tube?: N/A Weight changes, if any:  Wt Readings from Last 3 Encounters:  07/09/20 213 lb (96.6 kg)  05/26/20 221 lb 6 oz (100.4 kg)  05/05/20 219 lb 12.8 oz (99.7 kg)   Swallowing issues, if any: Caregiver denies any issues. Reports he is able to eat a wide variety of foods. Saw Glendell Docker Schinke-SLP on 06/17/2020: "At this time pt swallowing continues WNL/WFL with regular diet/thin liquids (ham sandwich and water tested today). SLP designed an individualized HEP for dysphagia and pt completed each exercise with indpendence. There are no overt s/s aspiration reported by pt or [name] (caregiver) at this time." Smoking or chewing tobacco? None Using fluoride trays daily? N/A Last ENT visit was on: Not since initial biopsy for diagnosis Other notable issues, if any: Caregiver reports minor skin changes the last week of radiation, but reports area is well healed now. Patient denies any symptoms of dry mouth, taste changes, thick saliva, or lymphedema to his neck/jaw. Caregiver reports he did well all through treatment, and continues to do well since completing radiation  Vitals:   07/09/20 1139  BP: 127/77  Pulse: 99  Resp: 18  Temp: (!) 97.3 F (36.3 C)  SpO2: 99%

## 2020-07-08 NOTE — Telephone Encounter (Signed)
Estill Bamberg I believe you have already taken care of this.

## 2020-07-08 NOTE — Telephone Encounter (Signed)
This has been addressed with the patient pharmacy.

## 2020-07-08 NOTE — Telephone Encounter (Signed)
A fax has been received from the patient's pharmacy, which as been sent back.

## 2020-07-09 ENCOUNTER — Other Ambulatory Visit: Payer: Self-pay

## 2020-07-09 ENCOUNTER — Ambulatory Visit
Admission: RE | Admit: 2020-07-09 | Discharge: 2020-07-09 | Disposition: A | Discharge status: Home / Self Care | Payer: Medicare Other | Source: Ambulatory Visit | Attending: Radiation Oncology | Admitting: Radiation Oncology

## 2020-07-09 VITALS — BP 127/77 | HR 99 | Temp 97.3°F | Resp 18 | Ht 73.5 in | Wt 213.0 lb

## 2020-07-09 DIAGNOSIS — Z923 Personal history of irradiation: Secondary | ICD-10-CM | POA: Diagnosis not present

## 2020-07-09 DIAGNOSIS — R1310 Dysphagia, unspecified: Secondary | ICD-10-CM | POA: Diagnosis not present

## 2020-07-09 DIAGNOSIS — Z79899 Other long term (current) drug therapy: Secondary | ICD-10-CM | POA: Diagnosis not present

## 2020-07-09 DIAGNOSIS — C058 Malignant neoplasm of overlapping sites of palate: Secondary | ICD-10-CM | POA: Insufficient documentation

## 2020-07-09 DIAGNOSIS — C050 Malignant neoplasm of hard palate: Secondary | ICD-10-CM

## 2020-07-09 NOTE — Progress Notes (Signed)
Radiation Oncology         [phone] ________________________________  Name: [name] Branch MRN: [id number]  Date: 07/09/2020  DOB: 05-19-1966  Follow-Up Visit Note  CC: [name], MD  Ladell Pier, MD  Diagnosis and Prior Radiotherapy:       ICD-10-CM   1. Cancer of overlapping sites of palate [facility])  C05.8     Radiation Treatment Dates: 05/20/2020 through 06/16/2020 Site Technique Total Dose (Gy) Dose per Fx (Gy) Completed Fx Beam Energies  Neck: HN_palate_necknodes IMRT 51/51 2.55 20/20 6X    CHIEF COMPLAINT:  Here for follow-up and surveillance of palate cancer  Narrative:  The patient returns today for routine follow-up.   [name] Branch presents today for 2 week follow-up after completing radiation to his palate/neck on 06/16/2020  Pain issues, if any: Patient denies (Caregiver also affirms that patient doesn't mention pain while at home) Using a feeding tube?: N/A Weight changes, if any:  Wt Readings from Last 3 Encounters:  07/09/20 213 lb (96.6 kg)  05/26/20 221 lb 6 oz (100.4 kg)  05/05/20 219 lb 12.8 oz (99.7 kg)   Swallowing issues, if any: Caregiver denies any issues. Reports he is able to eat a wide variety of foods. Saw Glendell Docker Schinke-SLP on 06/17/2020: "At this time pt swallowing continues WNL/WFL with regular diet/thin liquids (ham sandwich and water tested today). SLP designed an individualized HEP for dysphagia and pt completed each exercise with indpendence. There are no overt s/s aspiration reported by pt or [name] (caregiver) at this time." Smoking or chewing tobacco? None Using fluoride trays daily? N/A Last ENT visit was on: Not since initial biopsy for diagnosis Other notable issues, if any: Caregiver reports minor skin changes the last week of radiation, but reports area is well healed now. Patient denies any symptoms of dry mouth, taste changes, thick saliva, or lymphedema to his neck/jaw. Caregiver reports he did well all through  treatment, and continues to do well since completing radiation  Vitals:   07/09/20 1139  BP: 127/77  Pulse: 99  Resp: 18  Temp: (!) 97.3 F (36.3 C)  SpO2: 99%                       ALLERGIES:  is allergic to hydrocodone.  Meds: Current Outpatient Medications  Medication Sig Dispense Refill   cloZAPine (CLOZARIL) 100 MG tablet Take 250 mg by mouth at bedtime.     dapagliflozin propanediol (FARXIGA) 10 MG TABS tablet Take 10 mg by mouth every morning.     fluvoxaMINE (LUVOX) 100 MG tablet Take 300 mg by mouth at bedtime.     losartan (COZAAR) 25 MG tablet Take 25 mg by mouth every morning.     metoprolol succinate (TOPROL-XL) 25 MG 24 hr tablet Take 25 mg by mouth every morning.     Misc. Devices MISC Please provide patient with insurance approved incontinence garments/pads for incontinence of stool. 1 each 0   Multiple Vitamin (MULTIVITAMIN) capsule Take 1 capsule by mouth daily. 90 capsule 3   pravastatin (PRAVACHOL) 40 MG tablet TAKE ONE TABLET EACH DAY 90 tablet 3   AMBULATORY NON FORMULARY MEDICATION Place 1 drop into both eyes at bedtime. Medication Name: genteal lubricant eye D 15 ML (Patient not taking: Reported on 07/09/2020)     ammonium lactate (LAC-HYDRIN) 12 % lotion  (Patient not taking: Reported on 07/09/2020)     cholestyramine (QUESTRAN) 4 g packet USE 1 PACKET AS NEEDED (Patient not taking:  Reported on 07/09/2020) 90 each 1   hydrOXYzine (ATARAX/VISTARIL) 25 MG tablet 1 tablet by mouth   for agitation (Patient not taking: Reported on 07/09/2020)     ketoconazole (NIZORAL) 2 % cream Apply 1 fingertip amount to each foot daily. (Patient not taking: Reported on 07/09/2020) 30 g 0   latanoprost (XALATAN) 0.005 % ophthalmic solution Place 1 drop into both eyes at bedtime. (Patient not taking: Reported on 07/09/2020)     lidocaine (XYLOCAINE) 2 % solution Patient: Mix 1part 2% viscous lidocaine, 1part H20. Swish and swallow 31mL of diluted mixture, 27min  before meals and at bedtime, up to QID (Patient not taking: Reported on 07/09/2020) 200 mL 3   mineral oil-hydrophilic petrolatum (AQUAPHOR) ointment Apply      apply to hands four times a day (Patient not taking: Reported on 07/09/2020)     polyethylene glycol powder (GLYCOLAX/MIRALAX) 17 GM/SCOOP powder Dissolve 1 capful in at least 8 ounces of water as needed. (Patient not taking: Reported on 07/09/2020) 255 g 3   Travoprost, BAK Free, (TRAVATAN) 0.004 % SOLN ophthalmic solution Apply 1 drop to eye as needed. (Patient not taking: Reported on 07/09/2020)     Wheat Dextrin (BENEFIBER) POWD Take 15 mLs by mouth 3 (three) times daily with meals. Benefiber 1 tablespoon tid with meals (Patient not taking: Reported on 07/09/2020) 1 Can 11   No current facility-administered medications for this encounter.    Physical Findings: The patient is in no acute distress. Patient is alert and oriented. Wt Readings from Last 3 Encounters:  07/09/20 213 lb (96.6 kg)  05/26/20 221 lb 6 oz (100.4 kg)  05/05/20 219 lb 12.8 oz (99.7 kg)    height is 6' 1.5" (1.867 m) and weight is 213 lb (96.6 kg). His temporal temperature is 97.3 F (36.3 C) (abnormal). His blood pressure is 127/77 and his pulse is 99. His respiration is 18 and oxygen saturation is 99%. .  General: Alert, in no acute distress HEENT: Head is normocephalic Right oropharynx is notable for mild residual mucositis, resolving.  No thrush.  No trismus. Neck: Neck is notable for no palpable masses. Skin: Skin in treatment fields shows satisfactory healing    MSK: Ambulatory independently   Lab Findings: Lab Results  Component Value Date   WBC 4.8 02/19/2018   HGB 13.9 02/19/2018   HCT 42.7 02/19/2018   MCV 92.3 02/19/2018   PLT 163.0 02/19/2018    Lab Results  Component Value Date   TSH 1.670 12/31/2017   CMP     Component Value Date/Time   NA 140 02/19/2018 1544   NA 138 11/23/2017 1108   K 4.2 02/19/2018 1544   CL 103  02/19/2018 1544   CO2 32 02/19/2018 1544   GLUCOSE 96 02/19/2018 1544   BUN 19 02/19/2018 1544   BUN 9 11/23/2017 1108   CREATININE 1.19 02/19/2018 1544   CREATININE 1.01 09/02/2012 1022   CALCIUM 9.6 02/19/2018 1544   PROT 7.6 02/19/2018 1544   PROT 6.8 11/23/2017 1108   ALBUMIN 4.3 02/19/2018 1544   ALBUMIN 4.3 11/23/2017 1108   AST 23 02/19/2018 1544   ALT 27 02/19/2018 1544   ALKPHOS 89 02/19/2018 1544   BILITOT 0.3 02/19/2018 1544   BILITOT 0.6 11/23/2017 1108   GFRNONAA >60 12/30/2017 1252   GFRNONAA 89 09/02/2012 1022   GFRAA >60 12/30/2017 1252   GFRAA >89 09/02/2012 1022    Radiographic Findings: No results found.  Impression/Plan:    He is  healing well after radiation therapy.  He will follow-up with me in early March with a CT of the neck and chest with contrast for restaging at that time.  The patient and his caregivers are pleased with this plan.  I look forward to seeing him back next year.  Our navigator will contact the patient's surgeon to inform them of the plan above and coordinate care.   It would be reasonable for him to follow-up with his surgeon about 1 month for surveillance in the interim.  On date of service, in total, I spent 20 minutes on this encounter. Patient was seen in person. _____________________________________   Eppie Gibson, MD

## 2020-07-12 ENCOUNTER — Other Ambulatory Visit: Payer: Self-pay

## 2020-07-12 ENCOUNTER — Encounter: Payer: Self-pay | Admitting: Cardiology

## 2020-07-12 ENCOUNTER — Encounter: Payer: Self-pay | Admitting: Radiation Oncology

## 2020-07-12 ENCOUNTER — Ambulatory Visit: Payer: Medicare Other | Admitting: Cardiology

## 2020-07-12 VITALS — BP 111/63 | HR 77 | Resp 17 | Ht 74.0 in | Wt 219.0 lb

## 2020-07-12 DIAGNOSIS — I4510 Unspecified right bundle-branch block: Secondary | ICD-10-CM

## 2020-07-12 DIAGNOSIS — D869 Sarcoidosis, unspecified: Secondary | ICD-10-CM

## 2020-07-12 DIAGNOSIS — I5042 Chronic combined systolic (congestive) and diastolic (congestive) heart failure: Secondary | ICD-10-CM

## 2020-07-12 DIAGNOSIS — C058 Malignant neoplasm of overlapping sites of palate: Secondary | ICD-10-CM

## 2020-07-12 DIAGNOSIS — E782 Mixed hyperlipidemia: Secondary | ICD-10-CM

## 2020-07-12 DIAGNOSIS — I429 Cardiomyopathy, unspecified: Secondary | ICD-10-CM

## 2020-07-12 DIAGNOSIS — F209 Schizophrenia, unspecified: Secondary | ICD-10-CM

## 2020-07-12 MED ORDER — ENTRESTO 49-51 MG PO TABS: 1.0000 | ORAL_TABLET | Freq: Two times a day (BID) | ORAL | 0 refills | Status: DC

## 2020-07-12 NOTE — Progress Notes (Signed)
Date:  07/12/2020   ID:  [name] Branch, DOB 1965/11/18, MRN [id number]  PCP:  [name], MD  Cardiologist:  Rex Kras, DO, [facility] (established care 07/12/2020) Former Cardiology Providers: Dr. Cloretta Ned and Vilinda Flake PA.  REASON FOR CONSULT: Cardiomyopathy and chronic congestive heart failure  REQUESTING PHYSICIAN:  [name], MD [place],  [place] [address]  Chief Complaint  Patient presents with  . Congestive Heart Failure  . Cardiomyopathy  . New Patient (Initial Visit)    HPI  [name] Branch is a 54 y.o. 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  He is referred to the office at the request of [name]* for evaluation of cardiomyopathy and chronic congestive heart.  Given his intellectual disability and schizophrenia history of present illness is obtained by the guardian caregiver. Patient is accompanied by his guardian who is also his Sister [name] Branch and caregiver [name] Branch [phone] (guardian) and [name] Branch (caregiver).  Patient and guardian provided verbal consent in regards to discussing his care in the presence of the patient's caregiver.  Patient is new to the practice and is transferring his care from Dr. Tula Nakayama office from [facility].  Patient carries a history of cardiomyopathy presumed to be nonischemic and chronic combined systolic and diastolic heart failure.  According to  the patient's sister he has not been hospitalized for congestive heart failure exacerbation.  Based on EMR patient is noted to have cardiomyopathy since 2016 at the time of colon surgery.  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  Since then he has been under the care of Dr. Edwin Dada and his medical therapy has been uptitrated.  And he wanted to transfer his care to [facility] due to geographical preference.  Patient's sister states that he subsequently underwent surgery and completed radiation treatment as of 06/16/2020.  FUNCTIONAL STATUS: Able to ambulate 72minutes five day a week, depending on the weather.   ALLERGIES: Allergies  Allergen Reactions  . Hydrocodone Other (See Comments)    Exacerbation of urinary and fecal incontinence    MEDICATION LIST PRIOR TO VISIT: Current Meds  Medication Sig  . cloZAPine (CLOZARIL) 100 MG tablet Take 250 mg by mouth at bedtime.  . dapagliflozin propanediol (FARXIGA) 10 MG TABS tablet Take 10 mg by mouth every morning.  . fluvoxaMINE (LUVOX) 100 MG tablet Take 300 mg by mouth at bedtime.  . metoprolol succinate (TOPROL-XL) 25 MG 24 hr tablet Take 25 mg by mouth every morning.  . Multiple Vitamin (MULTIVITAMIN) capsule Take 1 capsule by mouth daily.  . pravastatin (PRAVACHOL) 40 MG tablet TAKE ONE TABLET EACH DAY  . [DISCONTINUED] losartan (COZAAR) 25 MG tablet Take 25 mg by mouth every morning.     PAST MEDICAL HISTORY: Past Medical History:  Diagnosis Date  . ABSCESS, ANAL/RECTAL REGIONS 08/18/2006   Annotation: Fournier gangrene Qualifier: History of  By: Garnette Scheuermann MD, Arlee Muslim    . Cardiomyopathy   . CHF (congestive heart failure) ([place])   . Colonic inertia   . Constipation   . Glaucoma   .  Hyperlipidemia   . Hypovolemic shock ([place])   . Incontinent of feces   . Mental retardation   . 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   Annotation: chronic with stercoral ulcer  Qualifier:  Diagnosis of  By: Garnette Scheuermann MD, Arlee Muslim    . Obstruction of colon (HCC)    recurrent  . Sarcoidosis   . Schizophrenia ([place])   . 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 Total self-care deficit    per caregiver he needs to have help bathing and cleaning self and needs help with all daily needs now     PAST SURGICAL HISTORY: Past Surgical History:  Procedure Laterality Date  . COLECTOMY N/A 09/25/2014   Procedure: OPEN ABDOMINAL COLECTOMY;  Surgeon: Michael Boston, MD;  Location: [facility];  Service: General;  Laterality: N/A;  . COLONOSCOPY  2007  . groin surgery     boil drained  . MOUTH SURGERY    . PROCTOSCOPY N/A 09/25/2014   Procedure: RIGID PROCTOSCOPY;  Surgeon: Michael Boston, MD;  Location: [facility];  Service: General;  Laterality: N/A;    FAMILY HISTORY: The patient family history includes Diabetes in his mother.  SOCIAL HISTORY:  The patient  reports that he has never smoked. He has never used smokeless tobacco. He reports that he does not drink alcohol and does not use drugs.  REVIEW OF SYSTEMS: Review of Systems  Constitutional: Negative for chills and fever.  HENT: Negative for hoarse voice and nosebleeds.   Eyes: Negative for discharge, double vision and pain.  Cardiovascular: Negative for chest pain, claudication, dyspnea on exertion, leg swelling, near-syncope, orthopnea, palpitations, paroxysmal nocturnal dyspnea and syncope.  Respiratory: Negative for hemoptysis and shortness of breath.   Musculoskeletal: Negative for muscle cramps and myalgias.  Gastrointestinal: Negative for abdominal pain, constipation, diarrhea, hematemesis, hematochezia, melena, nausea and vomiting.  Neurological: Negative for dizziness and light-headedness.   PHYSICAL EXAM: Vitals with BMI 07/12/2020 07/09/2020 05/26/2020  Height 6\' 2"  6' 1.5" 6' 1.5"  Weight 219 lbs 213 lbs 221 lbs 6 oz  BMI 28.11 39.76 73.41  Systolic 937 902 409  Diastolic 63 77 70  Pulse 77 99 100   CONSTITUTIONAL: Appears older  than stated age, hemodynamically stable, no acute distress.  SKIN: Skin is warm and dry. No rash noted. No cyanosis. No pallor. No jaundice HEAD: Normocephalic and atraumatic.  EYES: No scleral icterus MOUTH/THROAT: Moist oral membranes.  NECK: No JVD present. No thyromegaly noted. No carotid bruits  LYMPHATIC: No visible cervical adenopathy.  CHEST Normal respiratory effort. No intercostal retractions  LUNGS: Clear to auscultation bilaterally. No stridor. No wheezes. No rales.  CARDIOVASCULAR: Regular rate and rhythm, positive S1-S2, no murmurs rubs or gallops appreciated. ABDOMINAL: No apparent ascites.  EXTREMITIES: No peripheral edema, 2+ dorsalis pedis and posterior tibial pulses. HEMATOLOGIC: No significant bruising NEUROLOGIC: Oriented to person, place, and time. Nonfocal. Normal muscle tone.  PSYCHIATRIC: Normal mood and affect. Normal behavior. Cooperative  CARDIAC DATABASE: EKG: 07/12/2020: Normal sinus rhythm, 76bpm, right bundle branch block, left axis deviation, left anterior fascicular, without underlying injury pattern.   Echocardiogram: Echo 03/10/2020 and [facility] ([facility] Everywhere[facility] MODERATE LV DYSFUNCTION EF 35%, LV normal size NORMAL RIGHT VENTRICULAR SYSTOLIC FUNCTION VALVULAR REGURGITATION: MILD AR, TRIVIAL MR, TRIVIAL PR, TRIVIAL TR NO VALVULAR STENOSIS  Stress Testing: No results found for this or any previous visit from the past 1095 days.  Heart Catheterization: None  LABORATORY DATA: CBC Latest Ref Rng & Units 02/19/2018 12/30/2017 11/23/2017  WBC 4.0 - 10.5 K/uL 4.8  6.9 4.9  Hemoglobin 13.0 - 17.0 g/dL 13.9 13.0 13.6  Hematocrit 39.0 - 52.0 % 42.7 42.0 42.7  Platelets 150.0 - 400.0 K/uL 163.0 169 171    CMP Latest Ref Rng & Units 02/19/2018 12/30/2017 11/23/2017  Glucose 70 - 99 mg/dL 96 85 89  BUN 6 - 23 mg/dL 19 8 9   Creatinine 0.40 - 1.50 mg/dL 1.19 1.00 1.08  Sodium 135 - 145 mEq/L 140 139 138  Potassium 3.5 - 5.1 mEq/L 4.2 4.1 4.3  Chloride 96 - 112  mEq/L 103 106 101  CO2 19 - 32 mEq/L 32 28 24  Calcium 8.4 - 10.5 mg/dL 9.6 9.0 9.4  Total Protein 6.0 - 8.3 g/dL 7.6 6.8 6.8  Total Bilirubin 0.2 - 1.2 mg/dL 0.3 0.5 0.6  Alkaline Phos 39 - 117 U/L 89 85 83  AST 0 - 37 U/L 23 24 20   ALT 0 - 53 U/L 27 43 17    Lipid Panel     Component Value Date/Time   CHOL 133 03/25/2018 1155   TRIG 64 03/25/2018 1155   HDL 59 03/25/2018 1155   CHOLHDL 2.3 03/25/2018 1155   CHOLHDL 3.4 02/25/2014 1034   VLDL 11 02/25/2014 1034   LDLCALC 61 03/25/2018 1155   LABVLDL 13 03/25/2018 1155    No components found for: NTPROBNP No results for input(s): PROBNP in the last 8760 hours. No results for input(s): TSH in the last 8760 hours.  BMP No results for input(s): NA, K, CL, CO2, GLUCOSE, BUN, CREATININE, CALCIUM, GFRNONAA, GFRAA in the last 8760 hours.  HEMOGLOBIN A1C Lab Results  Component Value Date   HGBA1C 6.0 (H) 09/18/2014   MPG 126 09/18/2014    IMPRESSION:    ICD-10-CM   1. Chronic combined systolic and diastolic CHF, NYHA class 2 (HCC)  I50.42 EKG 12-Lead    sacubitril-valsartan (ENTRESTO) 49-51 MG    Basic metabolic panel    Magnesium    Pro b natriuretic peptide (BNP)    Basic metabolic panel    Pro b natriuretic peptide (BNP)    Magnesium  2. Cardiomyopathy, unspecified type ([place])  I42.9   3. Mixed hyperlipidemia  E78.2   4. RBBB  I45.10   5. Sarcoidosis  D86.9   6. Schizophrenia, unspecified type ([place])  F20.9      RECOMMENDATIONS: Gracyn Santillanes is a 54 y.o. 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  Chronic combined systolic and  diastolic heart failure, NYHA class II, stage B: Transition ARB to Entresto.  Baseline labs: BMP, NTproBNP, Mg.  Repeat blood in 1 week after starting Entresto.  If Delene Loll is cost prohibitive patient's guardian is asked to call the office and will try to help her out with other resources and see if patient assistance is an option.  Continue Toprol XL and [place]. May consider Aldactone if BP and labs allow in follow up.  Once he is on GDMT he would benefit from stress test to rule out reversible ischemia.  Recommend daily weight check, strict I/O's Fluid restriction to <2L per day, Na restriction < 2g per day Once on maximal GDMT would recommended echo in 90 days and based on the echo results will discuss the need to  see EP for AICD evaluation for primary prevention of SCD. Patient's caregiver and guardian verbalize understanding.   Cardiomyopathy, presumed NICMP: See above.   Hyperlipidemia, mixed: Continue statin.  Currently managed by primary care provider.  RBBB: Continue monitor.   Total encounter time 45 minutes. *Total Encounter Time as defined by the Centers for Medicare and Medicaid Services includes, in addition to the face-to-face time of a patient visit (documented in the note above) non-face-to-face time: obtaining and reviewing outside history, ordering and reviewing medications, tests or procedures, care coordination (communications with other health care professionals or caregivers) and documentation in the medical record.  FINAL MEDICATION LIST END OF ENCOUNTER: Meds ordered this encounter  Medications  . sacubitril-valsartan (ENTRESTO) 49-51 MG    Sig: Take 1 tablet by mouth 2 (two) times daily.    Dispense:  180 tablet    Refill:  0     Current Outpatient Medications:  .  cloZAPine (CLOZARIL) 100 MG tablet, Take 250 mg by mouth at bedtime., Disp: , Rfl:  .  dapagliflozin propanediol (FARXIGA) 10 MG TABS tablet, Take 10 mg by mouth every morning., Disp: , Rfl:  .   fluvoxaMINE (LUVOX) 100 MG tablet, Take 300 mg by mouth at bedtime., Disp: , Rfl:  .  metoprolol succinate (TOPROL-XL) 25 MG 24 hr tablet, Take 25 mg by mouth every morning., Disp: , Rfl:  .  Multiple Vitamin (MULTIVITAMIN) capsule, Take 1 capsule by mouth daily., Disp: 90 capsule, Rfl: 3 .  pravastatin (PRAVACHOL) 40 MG tablet, TAKE ONE TABLET EACH DAY, Disp: 90 tablet, Rfl: 3 .  sacubitril-valsartan (ENTRESTO) 49-51 MG, Take 1 tablet by mouth 2 (two) times daily., Disp: 180 tablet, Rfl: 0  Orders Placed This Encounter  Procedures  . Basic metabolic panel  . Magnesium  . Pro b natriuretic peptide (BNP)  . Basic metabolic panel  . Pro b natriuretic peptide (BNP)  . Magnesium  . EKG 12-Lead    There are no Patient Instructions on file for this visit.   --Continue cardiac medications as reconciled in final medication list. --Return in about 2 weeks (around 07/26/2020) for heart failure management.. Or sooner if needed. --Continue follow-up with your primary care physician regarding the management of your other chronic comorbid conditions.  Patient's questions and concerns were addressed to his satisfaction. He voices understanding of the instructions provided during this encounter.   This note was created using a voice recognition software as a result there may be grammatical errors inadvertently enclosed that do not reflect the nature of this encounter. Every attempt is made to correct such errors.  Rex Kras, [place], [facility]  Pager: [phone] Office: [phone]

## 2020-07-15 LAB — BASIC METABOLIC PANEL
BUN/Creatinine Ratio: 12 (ref 9–20)
BUN: 13 mg/dL (ref 6–24)
CO2: 26 mmol/L (ref 20–29)
Calcium: 9.3 mg/dL (ref 8.7–10.2)
Chloride: 103 mmol/L (ref 96–106)
Creatinine, Ser: 1.11 mg/dL (ref 0.76–1.27)
GFR calc Af Amer: 87 mL/min/{1.73_m2} (ref >59)
GFR calc non Af Amer: 75 mL/min/{1.73_m2} (ref >59)
Glucose: 87 mg/dL (ref 65–99)
Potassium: 4.4 mmol/L (ref 3.5–5.2)
Sodium: 140 mmol/L (ref 134–144)

## 2020-07-15 LAB — PRO B NATRIURETIC PEPTIDE: NT-Pro BNP: 102 pg/mL (ref 0–121)

## 2020-07-15 LAB — MAGNESIUM: Magnesium: 2.1 mg/dL (ref 1.6–2.3)

## 2020-07-21 ENCOUNTER — Other Ambulatory Visit: Payer: Self-pay

## 2020-07-21 DIAGNOSIS — I5042 Chronic combined systolic (congestive) and diastolic (congestive) heart failure: Secondary | ICD-10-CM

## 2020-07-21 NOTE — Progress Notes (Signed)
Oncology Nurse Navigator Documentation  At Dr. Pearlie Oyster request I faxed a copy of Mr. Lafontaine End of treat note for his Radiation Therapy to [name] at [facility]. Confirmation of successful fax transmission received.   Harlow Asa RN, BSN, OCN Head & Neck Oncology Nurse [place] at [facility] Phone # [phone]  Fax # [phone]

## 2020-07-21 NOTE — Progress Notes (Signed)
  Patient Name: [name] Branch MRN: [id number] DOB: 05/23/1966 Referring Physician: Karle Plumber Date of Service: 06/16/2020 [facility], [place]                                                        End Of Treatment Note  Diagnoses: C05.8-Malignant neoplasm of overlapping sites of palate  Cancer Staging: Cancer Staging Cancer of hard palate [facility]) Staging form: Oral Cavity, AJCC 8th Edition - Pathologic stage from 04/28/2020: Stage Unknown (pT2, pNX, cM0) - Signed by Eppie Gibson, MD on 05/03/2020  Intent: Curative  Radiation Treatment Dates: 05/20/2020 through 06/16/2020  Site Technique Total Dose (Gy) Dose per Fx (Gy) Completed Fx Beam Energies  Neck: HN_palate IMRT 51/51 2.55 20/20 6X   Narrative: The patient tolerated radiation therapy relatively well with excellent appetite and PO intake.   He had minimal complaints. The surgical bed and ipsilateral neck were treated.  Plan: The patient will follow-up with radiation oncology in 2-3 wks.  -----------------------------------  Eppie Gibson, MD

## 2020-07-22 ENCOUNTER — Other Ambulatory Visit: Payer: Self-pay | Admitting: Cardiology

## 2020-07-22 NOTE — Progress Notes (Signed)
Spoke to patient guardian

## 2020-07-28 ENCOUNTER — Emergency Department (HOSPITAL_COMMUNITY): Payer: Medicare Other

## 2020-07-28 ENCOUNTER — Encounter (HOSPITAL_COMMUNITY): Payer: Self-pay

## 2020-07-28 ENCOUNTER — Ambulatory Visit: Payer: Medicare Other

## 2020-07-28 ENCOUNTER — Telehealth: Payer: Self-pay | Admitting: Student

## 2020-07-28 ENCOUNTER — Emergency Department (HOSPITAL_COMMUNITY)
Admission: EM | Admit: 2020-07-28 | Discharge: 2020-07-29 | Disposition: A | Discharge status: Home / Self Care | Payer: Medicare Other | Attending: Emergency Medicine | Admitting: Emergency Medicine

## 2020-07-28 ENCOUNTER — Other Ambulatory Visit: Payer: Self-pay

## 2020-07-28 DIAGNOSIS — I509 Heart failure, unspecified: Secondary | ICD-10-CM | POA: Insufficient documentation

## 2020-07-28 DIAGNOSIS — Z85818 Personal history of malignant neoplasm of other sites of lip, oral cavity, and pharynx: Secondary | ICD-10-CM | POA: Insufficient documentation

## 2020-07-28 DIAGNOSIS — R0602 Shortness of breath: Secondary | ICD-10-CM | POA: Diagnosis present

## 2020-07-28 LAB — CBC WITH DIFFERENTIAL/PLATELET
Abs Immature Granulocytes: 0.01 10*3/uL (ref 0.00–0.07)
Basophils Absolute: 0 10*3/uL (ref 0.0–0.1)
Basophils Relative: 1 %
Eosinophils Absolute: 0.1 10*3/uL (ref 0.0–0.5)
Eosinophils Relative: 3 %
HCT: 48.7 % (ref 39.0–52.0)
Hemoglobin: 14.7 g/dL (ref 13.0–17.0)
Immature Granulocytes: 0 %
Lymphocytes Relative: 19 %
Lymphs Abs: 0.8 10*3/uL (ref 0.7–4.0)
MCH: 30.7 pg (ref 26.0–34.0)
MCHC: 30.2 g/dL (ref 30.0–36.0)
MCV: 101.7 fL — ABNORMAL HIGH (ref 80.0–100.0)
Monocytes Absolute: 0.5 10*3/uL (ref 0.1–1.0)
Monocytes Relative: 13 %
Neutro Abs: 2.6 10*3/uL (ref 1.7–7.7)
Neutrophils Relative %: 64 %
Platelets: 161 10*3/uL (ref 150–400)
RBC: 4.79 MIL/uL (ref 4.22–5.81)
RDW: 14.1 % (ref 11.5–15.5)
WBC: 4.1 10*3/uL (ref 4.0–10.5)
nRBC: 0 % (ref 0.0–0.2)

## 2020-07-28 LAB — BASIC METABOLIC PANEL
Anion gap: 8 (ref 5–15)
BUN: 18 mg/dL (ref 6–20)
CO2: 25 mmol/L (ref 22–32)
Calcium: 9.1 mg/dL (ref 8.9–10.3)
Chloride: 105 mmol/L (ref 98–111)
Creatinine, Ser: 1.22 mg/dL (ref 0.61–1.24)
GFR, Estimated: >60 mL/min (ref >60)
Glucose, Bld: 111 mg/dL — ABNORMAL HIGH (ref 70–99)
Potassium: 4.6 mmol/L (ref 3.5–5.1)
Sodium: 138 mmol/L (ref 135–145)

## 2020-07-28 MED ORDER — FUROSEMIDE 20 MG PO TABS: 20.0000 mg | ORAL_TABLET | Freq: Every day | ORAL | 0 refills | Status: DC

## 2020-07-28 MED ORDER — CLOZAPINE 100 MG PO TABS
100.0000 mg | ORAL_TABLET | Freq: Once | ORAL | Status: AC
  Administered 2020-07-28: 23:00:00 100 mg via ORAL
  Filled 2020-07-28: qty 1

## 2020-07-28 MED ORDER — FLUVOXAMINE MALEATE 50 MG PO TABS
100.0000 mg | ORAL_TABLET | Freq: Once | ORAL | Status: AC
  Administered 2020-07-28: 23:00:00 100 mg via ORAL
  Filled 2020-07-28: qty 2

## 2020-07-28 NOTE — Discharge Instructions (Addendum)
Call your primary care doctor or specialist as discussed in the next 2-3 days.   Return immediately back to the ER if:  Your symptoms worsen within the next 12-24 hours. You develop new symptoms such as new fevers, persistent vomiting, new pain, shortness of breath, or new weakness or numbness, or if you have any other concerns.  

## 2020-07-28 NOTE — ED Triage Notes (Addendum)
Pt BIB EMS from home. 6 days ago pt was having some difficulty breathing due to CHF. Pt had X-rays done 2 days ago and was diagnosed with pneumonia and was given antibiotics. POA wants pt reevaluated.  97% RA BP 110/60 HR 90

## 2020-07-28 NOTE — Progress Notes (Signed)
Pharmacy - Clozapine     This patient's order has been reviewed for prescribing contraindications.  Labs:  NEED ANC  The medication is being dispensed pursuant to the FDA REMS suspension order of 06/18/20 that allows for dispensing without a patient REMS dispense authorization (RDA)   Arley Phenix RPh 07/28/2020, 10:20 PM

## 2020-07-28 NOTE — ED Provider Notes (Signed)
[facility]-EMERGENCY DEPT Provider Note   CSN: [id number] Arrival date & time: 07/28/20  1516     History Chief Complaint  Patient presents with  . Pneumonia    [name] Branch is a 54 y.o. male.  Patient brought in by his caregiver, concern of shortness of breath.  He states that he was recently diagnosed with pneumonia couple of days ago at urgent care, they are concerned that he is not getting any better.  Denies any fevers, denies any cough.  He does have shortness of breath with exertional activities and he does have a runny nose.  Denies any pain at this time.        Past Medical History:  Diagnosis Date  . ABSCESS, ANAL/RECTAL REGIONS 08/18/2006   Annotation: Fournier gangrene Qualifier: History of  By: Silvestre Mesi MD, Garnette Gunner    . Cardiomyopathy   . CHF (congestive heart failure) (HCC)   . Colonic inertia   . Constipation   . Glaucoma   . Hyperlipidemia   . Hypovolemic shock (HCC)   . Incontinent of feces   . Mental retardation   . 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   Annotation: chronic with stercoral ulcer  Qualifier: Diagnosis of  By: Silvestre Mesi MD, Garnette Gunner    . Obstruction of colon (HCC)    recurrent  . Sarcoidosis   . Schizophrenia (HCC)   . 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 Total self-care deficit    per caregiver he needs to have help bathing and cleaning self and needs help with all daily needs now     Patient Active Problem List   Diagnosis Date Noted  . 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 Incontinent of feces   . Total self-care deficit   . S/P total colectomy 07/20/2017  . Dizziness 10/12/2016  . Anemia of chronic disease 10/05/2014  . Colonic inertia s/p abdominal colectomy 09/25/2014 09/25/2014  . Hyperlipidemia 09/17/2008  . Glaucoma 10/10/2006  . Sarcoidosis 06/11/2006  . Schizophrenia, unspecified type (HCC) 06/11/2006  . Mental retardation 06/11/2006  . Chronic systolic CHF (congestive heart failure) (HCC) 06/11/2006  .  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    Past Surgical History:  Procedure Laterality Date  . COLECTOMY N/A 09/25/2014   Procedure: OPEN ABDOMINAL COLECTOMY;  Surgeon: Karie Soda, MD;  Location: [facility];  Service: General;  Laterality: N/A;  . COLONOSCOPY  2007  . groin surgery     boil drained  . MOUTH SURGERY    . PROCTOSCOPY N/A 09/25/2014   Procedure: RIGID PROCTOSCOPY;  Surgeon: Karie Soda, MD;  Location: [facility];  Service: General;  Laterality: N/A;       Family History  Problem Relation Age of Onset  . Diabetes Mother   . Colon cancer Neg Hx   . Colon polyps Neg Hx   . Esophageal cancer Neg Hx   . Rectal cancer Neg Hx   . Stomach cancer Neg Hx     Social History   Tobacco Use  . Smoking status: Never Smoker  . Smokeless tobacco: Never Used  Vaping Use  . Vaping Use: Never used  Substance Use Topics  . Alcohol use: No    Alcohol/week: 0.0 standard drinks  . Drug use: No    Home Medications Prior to Admission medications   Medication Sig Start Date End Date Taking? Authorizing Provider  cloZAPine (CLOZARIL) 100 MG tablet Take 250 mg by mouth at bedtime.    [provider]  dapagliflozin propanediol (FARXIGA) 10 MG  TABS tablet Take 10 mg by mouth every morning. 04/19/20 04/19/21  [provider]  fluvoxaMINE (LUVOX) 100 MG tablet Take 300 mg by mouth at bedtime.    [provider]  metoprolol succinate (TOPROL-XL) 25 MG 24 hr tablet Take 25 mg by mouth every morning. 03/11/20 03/11/21  [provider]  Multiple Vitamin (MULTIVITAMIN) capsule Take 1 capsule by mouth daily. 03/25/18   Ladell Pier, MD  pravastatin (PRAVACHOL) 40 MG tablet TAKE ONE TABLET EACH DAY 03/25/18   Ladell Pier, MD  sacubitril-valsartan (ENTRESTO) 49-51 MG Take 1 tablet by mouth 2 (two) times daily. 07/12/20 10/10/20  Tolia, Sunit, DO    Allergies    Hydrocodone  Review of Systems   Review of Systems  Constitutional: Negative for fever.  HENT:  Negative for ear pain and sore throat.   Eyes: Negative for pain.  Respiratory: Negative for cough.   Cardiovascular: Negative for chest pain.  Gastrointestinal: Negative for abdominal pain.  Genitourinary: Negative for flank pain.  Musculoskeletal: Negative for back pain.  Skin: Negative for color change and rash.  Neurological: Negative for syncope.  All other systems reviewed and are negative.   Physical Exam Updated Vital Signs BP 99/72 (BP Location: Right Arm)   Pulse 92   Temp 98.4 F (36.9 C) (Oral)   Resp 18   SpO2 97%   Physical Exam Constitutional:      General: He is not in acute distress.    Appearance: He is well-developed.  HENT:     Head: Normocephalic.     Mouth/Throat:     Mouth: Mucous membranes are moist.  Cardiovascular:     Rate and Rhythm: Normal rate.  Pulmonary:     Effort: Pulmonary effort is normal.  Abdominal:     Palpations: Abdomen is soft.  Musculoskeletal:     Right lower leg: No edema.     Left lower leg: No edema.  Skin:    General: Skin is warm.     Capillary Refill: Capillary refill takes less than 2 seconds.  Neurological:     General: No focal deficit present.     Mental Status: He is alert.     ED Results / Procedures / Treatments   Labs (all labs ordered are listed, but only abnormal results are displayed) Labs Reviewed  BASIC METABOLIC PANEL  CBC WITH DIFFERENTIAL/PLATELET  BRAIN NATRIURETIC PEPTIDE    EKG None  Radiology DG Chest 1 View  Result Date: 07/28/2020 CLINICAL DATA:  Shortness of breath EXAM: CHEST  1 VIEW COMPARISON:  04/05/2014 FINDINGS: Heart size and pulmonary vascularity are normal. Shallow inspiration with linear atelectasis or infiltration in both lung bases. No pleural effusions. No pneumothorax. Mediastinal contours appear intact. IMPRESSION: Shallow inspiration with linear atelectasis or infiltration in both lung bases. Electronically Signed   By: Lucienne Capers M.D.   On: 07/28/2020 19:31     Procedures Procedures (including critical care time)  Medications Ordered in ED Medications - No data to display  ED Course  I have reviewed the triage vital signs and the nursing notes.  Pertinent labs & imaging results that were available during my care of the patient were reviewed by me and considered in my medical decision making (see chart for details).    MDM Rules/Calculators/A&P                          Appears comfortable at rest at this time.  Vital  signs within normal limits.  His history shows a medical history of sarcoidosis and congestive heart failure.   Final Clinical Impression(s) / ED Diagnoses Final diagnoses:  None    Rx / DC Orders ED Discharge Orders    None       Cheryll Cockayne, MD 07/29/20 0001

## 2020-07-28 NOTE — Telephone Encounter (Signed)
Thanks for the update

## 2020-07-28 NOTE — Telephone Encounter (Signed)
ON-CALL CARDIOLOGY 07/28/20  Patient's name: [name] Branch.   MRN: [id number].    DOB: Apr 23, 1966 Primary care provider: Margot Ables, MD. Primary cardiologist: Dr. Ihor Austin regarding this patient's care today: Patient's sister called to notify our team that patient is currently in the ED. He has been having worsening shortness of breath and congestion. His sister reports patient was recently diagnosed with pneumonia and has not gotten better so she took him to the ED today.   She also wanted to verify that patient was supposed to stop losartan and switch to [facility]. Reviewed Dr. Emelda Brothers last note (07/12/2020) and confirmed patient was to transition from losartan to [facility]. Advised patient's sister that we would see patient in the ED if needed, we will wait for ED providers to notify our team accordingly.    Telephone encounter total time: 7 minutes     Rayford Halsted, PA-C 07/28/2020, 10:45 PM Office: [phone]

## 2020-08-03 ENCOUNTER — Encounter: Payer: Self-pay | Admitting: Cardiology

## 2020-08-03 ENCOUNTER — Ambulatory Visit: Payer: Medicare Other | Admitting: Cardiology

## 2020-08-03 ENCOUNTER — Other Ambulatory Visit: Payer: Self-pay

## 2020-08-03 VITALS — BP 117/72 | HR 81 | Resp 16 | Ht 74.0 in | Wt 223.0 lb

## 2020-08-03 DIAGNOSIS — I429 Cardiomyopathy, unspecified: Secondary | ICD-10-CM

## 2020-08-03 DIAGNOSIS — I5042 Chronic combined systolic (congestive) and diastolic (congestive) heart failure: Secondary | ICD-10-CM

## 2020-08-03 DIAGNOSIS — E782 Mixed hyperlipidemia: Secondary | ICD-10-CM

## 2020-08-03 DIAGNOSIS — F209 Schizophrenia, unspecified: Secondary | ICD-10-CM

## 2020-08-03 DIAGNOSIS — I4510 Unspecified right bundle-branch block: Secondary | ICD-10-CM

## 2020-08-03 MED ORDER — ENTRESTO 97-103 MG PO TABS: 1.0000 | ORAL_TABLET | Freq: Two times a day (BID) | ORAL | 0 refills | Status: AC

## 2020-08-03 NOTE — Progress Notes (Addendum)
ID:  [name] Branch, DOB [birth year], MRN [id number]  PCP:  [name], MD  Cardiologist:  Rex Kras, DO, [facility] (established care 07/12/2020) Former Cardiology Providers: Dr. Cloretta Ned and Vilinda Flake PA.  Date: 08/03/20 Last Office Visit: 07/12/2020  Chief Complaint  Patient presents with  . Heart Failure Management  . Follow-up    2 weeks    HPI  [name] Branch is a 55 y.o. 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  He is referred to the office at the request of [name]* for evaluation of cardiomyopathy and chronic congestive heart.  Given his intellectual disability and schizophrenia his is accompanied by his guardian [name] (sister / guardian) [phone] and Shea Evans (caregiver).  Patient and guardian provided verbal consent in regards to discussing his care in the presence of the patient's caregiver.  He was seeing Dr. Edwin Dada at [facility] and re-established care with our practice back in December 2021 for management of cardiomyopathy presumed to be nonischemic and chronic combined systolic and diastolic heart failure.  Based on EMR patient is noted to have cardiomyopathy since 2016 at the time of colon surgery.  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 Patient's sister states that he subsequently  underwent surgery and completed radiation treatment as of 06/16/2020.  At last office visit we discontinued losartan and started him on Entresto 49/51 mg p.o. twice daily.  He had blood work thereafter there was independently reviewed and kidney function and electrolytes remain within normal limits.  However, since then he had gone to [facility] emergency room department on July 28, 2020 due to shortness of breath and was diagnosed with pneumonia.  He is currently on antibiotics.  Both the caregiver and guardian state that he does not check his blood pressures or weights on a daily basis.  I do not have a medication list or medication bottles to perform an accurate med reconciliation during this encounter, it was based off the caregiver's recollection. Recommended to bring an accurate med list of medication bottles at next visit.   Patient denies chest pain at rest or with effort related activities.  He denies orthopnea, paroxysmal nocturnal dyspnea or lower extremity swelling.  FUNCTIONAL STATUS: Able to ambulate 53minutes five day a week, depending on the weather.   ALLERGIES: Allergies  Allergen Reactions  . Hydrocodone Other (See Comments)    Exacerbation of urinary and fecal incontinence    MEDICATION LIST PRIOR TO VISIT: Current Meds  Medication Sig  . cloZAPine (CLOZARIL) 100 MG tablet Take 250 mg by mouth at bedtime.  . dapagliflozin propanediol (FARXIGA) 10 MG TABS tablet Take 10 mg by mouth every morning.  . fluvoxaMINE (LUVOX) 100 MG tablet Take 300 mg by mouth at bedtime.  . metoprolol succinate (TOPROL-XL) 25 MG 24 hr tablet Take 25 mg by mouth every morning.  . Multiple Vitamin (MULTIVITAMIN) capsule Take 1 capsule by mouth daily.  . pravastatin (PRAVACHOL) 40 MG tablet TAKE ONE TABLET EACH DAY  . sacubitril-valsartan (ENTRESTO)  97-103 MG Take 1 tablet by mouth 2 (two) times daily.  . [DISCONTINUED] furosemide (LASIX) 20 MG tablet Take 1 tablet (20 mg total) by mouth  daily for 10 days.  . [DISCONTINUED] sacubitril-valsartan (ENTRESTO) 49-51 MG Take 1 tablet by mouth 2 (two) times daily.     PAST MEDICAL HISTORY: Past Medical History:  Diagnosis Date  . ABSCESS, ANAL/RECTAL REGIONS 08/18/2006   Annotation: Fournier gangrene Qualifier: History of  By: Garnette Scheuermann MD, Arlee Muslim    . Cardiomyopathy   . CHF (congestive heart failure) ([place])   . Colonic inertia   . Constipation   . Glaucoma   . Hyperlipidemia   . Hypovolemic shock ([place])   . Incontinent of feces   . Mental retardation   . 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   Annotation: chronic with stercoral ulcer  Qualifier: Diagnosis of  By: Garnette Scheuermann MD, Arlee Muslim    . Obstruction of colon (HCC)    recurrent  . Sarcoidosis   . Schizophrenia ([place])   . 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 Total self-care deficit    per caregiver he needs to have help bathing and cleaning self and needs help with all daily needs now     PAST SURGICAL HISTORY: Past Surgical History:  Procedure Laterality Date  . COLECTOMY N/A 09/25/2014   Procedure: OPEN ABDOMINAL COLECTOMY;  Surgeon: Michael Boston, MD;  Location: [facility];  Service: General;  Laterality: N/A;  . COLONOSCOPY  2007  . groin surgery     boil drained  . MOUTH SURGERY    . PROCTOSCOPY N/A 09/25/2014   Procedure: RIGID PROCTOSCOPY;  Surgeon: Michael Boston, MD;  Location: [facility];  Service: General;  Laterality: N/A;    FAMILY HISTORY: The patient family history includes Diabetes in his mother.  SOCIAL HISTORY:  The patient  reports that he has never smoked. He has never used smokeless tobacco. He reports that he does not drink alcohol and does not use drugs.  REVIEW OF SYSTEMS: Review of Systems  Constitutional: Negative for chills and fever.  HENT: Negative for hoarse voice and nosebleeds.   Eyes: Negative for discharge, double vision and pain.  Cardiovascular: Negative for chest pain, claudication, dyspnea on exertion, leg swelling, near-syncope, orthopnea, palpitations,  paroxysmal nocturnal dyspnea and syncope.  Respiratory: Negative for hemoptysis and shortness of breath.   Musculoskeletal: Negative for muscle cramps and myalgias.  Gastrointestinal: Negative for abdominal pain, constipation, diarrhea, hematemesis, hematochezia, melena, nausea and vomiting.  Neurological: Negative for dizziness and light-headedness.   PHYSICAL EXAM: Vitals with BMI 08/03/2020 07/29/2020 07/28/2020  Height 6\' 2"  - -  Weight 223 lbs - -  BMI 0000000 - -  Systolic [id number]  Diastolic 72 75 81  Pulse 81 80 81   CONSTITUTIONAL: Appears older than stated age, hemodynamically stable, no acute distress.  SKIN: Skin is warm and dry. No rash noted. No cyanosis. No pallor. No jaundice HEAD: Normocephalic and atraumatic.  EYES: No scleral icterus MOUTH/THROAT: Moist oral membranes.  NECK: No JVD present. No thyromegaly noted. No carotid bruits  LYMPHATIC: No visible cervical adenopathy.  CHEST Normal respiratory effort. No intercostal retractions  LUNGS: Clear to auscultation bilaterally. No stridor. No wheezes. No rales.  CARDIOVASCULAR: Regular rate and rhythm, positive S1-S2, no murmurs rubs or gallops appreciated. ABDOMINAL: No apparent ascites.  EXTREMITIES: No peripheral edema, 2+ dorsalis pedis and posterior tibial pulses. HEMATOLOGIC: No significant bruising NEUROLOGIC: Oriented to person, place, and time. Nonfocal. Normal muscle tone.  PSYCHIATRIC: Normal mood and  affect. Normal behavior. Cooperative  CARDIAC DATABASE: EKG: 07/12/2020: Normal sinus rhythm, 76bpm, right bundle branch block, left axis deviation, left anterior fascicular, without underlying injury pattern.   Echocardiogram: Echo 03/10/2020 and [facility] MODERATE LV DYSFUNCTION EF 35%, LV normal size NORMAL RIGHT VENTRICULAR SYSTOLIC FUNCTION VALVULAR REGURGITATION: MILD AR, TRIVIAL MR, TRIVIAL PR, TRIVIAL TR NO VALVULAR STENOSIS  Stress Testing: No results found for this or any  previous visit from the past 1095 days.  Heart Catheterization: None  LABORATORY DATA: CBC Latest Ref Rng & Units 07/28/2020 02/19/2018 12/30/2017  WBC 4.0 - 10.5 K/uL 4.1 4.8 6.9  Hemoglobin 13.0 - 17.0 g/dL 14.7 13.9 13.0  Hematocrit 39.0 - 52.0 % 48.7 42.7 42.0  Platelets 150 - 400 K/uL 161 163.0 169    CMP Latest Ref Rng & Units 07/28/2020 07/14/2020 02/19/2018  Glucose 70 - 99 mg/dL 111(H) 87 96  BUN 6 - 20 mg/dL 18 13 19   Creatinine 0.61 - 1.24 mg/dL 1.22 1.11 1.19  Sodium 135 - 145 mmol/L 138 140 140  Potassium 3.5 - 5.1 mmol/L 4.6 4.4 4.2  Chloride 98 - 111 mmol/L 105 103 103  CO2 22 - 32 mmol/L 25 26 32  Calcium 8.9 - 10.3 mg/dL 9.1 9.3 9.6  Total Protein 6.0 - 8.3 g/dL - - 7.6  Total Bilirubin 0.2 - 1.2 mg/dL - - 0.3  Alkaline Phos 39 - 117 U/L - - 89  AST 0 - 37 U/L - - 23  ALT 0 - 53 U/L - - 27    Lipid Panel     Component Value Date/Time   CHOL 133 03/25/2018 1155   TRIG 64 03/25/2018 1155   HDL 59 03/25/2018 1155   CHOLHDL 2.3 03/25/2018 1155   CHOLHDL 3.4 02/25/2014 1034   VLDL 11 02/25/2014 1034   LDLCALC 61 03/25/2018 1155   LABVLDL 13 03/25/2018 1155    No components found for: NTPROBNP Recent Labs    07/14/20 1559  PROBNP 102   No results for input(s): TSH in the last 8760 hours.  BMP Recent Labs    07/14/20 1559 07/28/20 2234  NA 140 138  K 4.4 4.6  CL 103 105  CO2 26 25  GLUCOSE 87 111*  BUN 13 18  CREATININE 1.11 1.22  CALCIUM 9.3 9.1  GFRNONAA 75 >60  GFRAA 87  --     HEMOGLOBIN A1C Lab Results  Component Value Date   HGBA1C 6.0 (H) 09/18/2014   MPG 126 09/18/2014    IMPRESSION:    ICD-10-CM   1. Chronic combined systolic and diastolic CHF, NYHA class 2 (HCC)  I50.42 sacubitril-valsartan (ENTRESTO) 97-103 MG  2. Cardiomyopathy, unspecified type ([place])  I42.9   3. Mixed hyperlipidemia  E78.2   4. RBBB  I45.10   5. Schizophrenia, unspecified type ([place])  F20.9      RECOMMENDATIONS: [name] Branch is a 55 y.o. 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  Chronic combined systolic and diastolic heart failure, NYHA class II, stage B: Discontinue Entresto 49/51 mg p.o. twice daily. Uptitrate Entresto to 97/103 mg p.o. twice daily. Blood work in 1 week to reevaluate kidney function, NT  proBNP, and electrolytes. Since were uptitrating Entresto would recommend holding Lasix for now to help prevent renal injury.  Stop Lasix 20 mg p.o. daily. Continue Toprol XL and [place]. May consider Aldactone if BP and labs allow in follow up.  Once he is on GDMT he would benefit from stress test to rule out reversible ischemia.  Recommend daily weight check, strict I/O's Fluid restriction to <2L per day, Na restriction < 2g per day Once on maximal GDMT would recommended echo in 90 days and based on the echo results will discuss the need to see EP for AICD evaluation for primary prevention of SCD. Patient's caregiver and guardian verbalize understanding.   Cardiomyopathy, presumed NICMP: See above.   Hyperlipidemia, mixed: Continue statin.  Currently managed by primary care provider.  RBBB: Continue monitor.   Continue follow-up with PCP regarding the management of his other chronic comorbid conditions.  Plan of care discussed with both the patient, caregiver, and patient's guardian.  Total encounter time 34 minutes.   FINAL MEDICATION LIST END OF ENCOUNTER:  Medications Discontinued During This Encounter  Medication Reason  . sacubitril-valsartan (ENTRESTO) 49-51 MG Dose change  .  furosemide (LASIX) 20 MG tablet Discontinued by provider   Meds ordered this encounter  Medications  . sacubitril-valsartan (ENTRESTO) 97-103 MG    Sig: Take 1 tablet by mouth 2 (two) times daily.    Dispense:  180 tablet    Refill:  0     Current Outpatient Medications:  .  cloZAPine (CLOZARIL) 100 MG tablet, Take 250 mg by mouth at bedtime., Disp: , Rfl:  .  dapagliflozin propanediol (FARXIGA) 10 MG TABS tablet, Take 10 mg by mouth every morning., Disp: , Rfl:  .  fluvoxaMINE (LUVOX) 100 MG tablet, Take 300 mg by mouth at bedtime., Disp: , Rfl:  .  metoprolol succinate (TOPROL-XL) 25 MG 24 hr tablet, Take 25 mg by mouth every morning., Disp: , Rfl:  .  Multiple Vitamin (MULTIVITAMIN) capsule, Take 1 capsule by mouth daily., Disp: 90 capsule, Rfl: 3 .  pravastatin (PRAVACHOL) 40 MG tablet, TAKE ONE TABLET EACH DAY, Disp: 90 tablet, Rfl: 3 .  sacubitril-valsartan (ENTRESTO) 97-103 MG, Take 1 tablet by mouth 2 (two) times daily., Disp: 180 tablet, Rfl: 0  No orders of the defined types were placed in this encounter.  There are no Patient Instructions on file for this visit.   --Continue cardiac medications as reconciled in final medication list. --Return in about 2 weeks (around 08/17/2020) for heart failure management.. Or sooner if needed. --Continue follow-up with your primary care physician regarding the management of your other chronic comorbid conditions.  Patient's questions and concerns were addressed to his satisfaction. He voices understanding of the instructions provided during this encounter.   This note was created using a voice recognition software as a result there may be grammatical errors inadvertently enclosed that do not reflect the nature of this encounter. Every attempt is made to correct such errors.  Tessa Lerner, [place], [facility]  Pager: [phone] Office: [phone]

## 2020-08-11 ENCOUNTER — Ambulatory Visit (HOSPITAL_COMMUNITY): Payer: Medicare Other | Admitting: Dentistry

## 2020-08-11 ENCOUNTER — Encounter (HOSPITAL_COMMUNITY): Payer: Self-pay | Admitting: Dentistry

## 2020-08-11 ENCOUNTER — Other Ambulatory Visit: Payer: Self-pay

## 2020-08-11 DIAGNOSIS — C050 Malignant neoplasm of hard palate: Secondary | ICD-10-CM | POA: Diagnosis not present

## 2020-08-11 DIAGNOSIS — Z923 Personal history of irradiation: Secondary | ICD-10-CM | POA: Diagnosis not present

## 2020-08-11 MED ORDER — SODIUM FLUORIDE 1.1 % DT CREA: TOPICAL_CREAM | DENTAL | 12 refills | Status: AC

## 2020-08-11 NOTE — Patient Instructions (Signed)
[place] Department of Dental Medicine Dr. Nic Lampe B. Izzie Geers, DMD Phone: [phone] Fax: [phone]   It was a pleasure seeing you today!  Please refer to the information below regarding your dental visit with us, and call us should you have any questions or concerns that may come up after you leave.   Thank you for giving us the opportunity to provide care for you.  If there is anything we can do for you, please let us know.    RECOMMENDATIONS: 1. Brush after meals and at bedtime.  Floss once a day, and use fluoride at bedtime. 2. Use trismus exercises as directed below. 3. Use CLOSYs for dry mouth, and salt water/baking soda rinses to help with any mouth sores. 4. Take multiple sips of water as needed.  Stay hydrated. 5. Return to your regular dentist for routine dental care including cleanings and periodic exams.  If you do not have a regular dentist, it is important to establish care at an outside dental office of your choice.  6. Call us if any problems or concerns arise.  TRISMUS Trismus is a condition where the jaw does not allow the mouth to open as wide as it usually does.  This can happen almost suddenly, or in other cases the process is so slow, it is hard to notice it-until it is too far along.  When the jaw joints and/or muscles have been exposed to radiation treatments, the onset of Trismus is very slow.  This is because the muscles are losing their stretching ability over a long period of time, as long as 2 YEARS after the end of radiation.  It is therefore important to exercise these muscles and joints.  TRISMUS EXERCISES:  Stack of tongue depressors measuring the same or a little less than the last documented MIO (Maximum Interincisal Opening).  Secure them with a rubber band on both ends.  Place the stack in the patient's mouth, supporting the other end.  Allow 30 seconds for muscle stretching.  Rest for a few seconds.  Repeat 3-5 times.  For all  radiation patients, this exercise is recommended in the mornings and evenings unless otherwise instructed.  The exercise should be done for a period of 2 YEARS after the end of radiation.  Maximum jaw opening should be checked routinely on recall dental visits by your general dentist.  The patient is advised to report any changes, soreness, or difficulties encountered when doing the exercises.  

## 2020-08-11 NOTE — Progress Notes (Signed)
DENTAL VISIT PROGRESS NOTE   Date of Appointment:  08/11/2020 Patient Name:   [name] Branch Date of Birth:   [birth year] Medical Record Number: [id number]   COVID 19 SCREENING: The patient does not symptoms concerning for COVID-19 infection (Including fever, chills, cough, or new SHORTNESS OF BREATH).     . 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 Patient has completed 20 of 20 radiation treatments from 05/20/20 to 06/16/20. . Reviewed medical history with the patient.  No changes reported.  VITALS: BP 116/66 (BP Location: Right Arm)   Pulse 94   Temp 98.5 F (36.9 C)   Wt 220 lb (99.8 kg)   BMI 28.25 kg/m    REVIEW OF CHIEF COMPLAINTS: DRY MOUTH: No HARD TO SWALLOW: No  HURT TO SWALLOW: No TASTE CHANGES: No SORES IN MOUTH: No TRISMUS: Patient denies symptoms. WEIGHT: 220 lbs.  Maintained weight throughout radiation.  HOME ORAL HYGIENE REGIMEN: BRUSHING: Twice a day with difficulty.  Caregiver reports having to be with him and help him brush, otherwise patient does not brush thoroughly. FLOSSING: No RINSING: Not currently using any mouthrinses FLUORIDE: No.  Rx for Prevident 5000 was sent in today for pt to start using daily. TRISMUS EXERCISES: Maximum interincisal opening: 25 mm down from 35 mm   Patient Active Problem List   Diagnosis Date Noted  . 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 Incontinent of feces   . Total self-care deficit   . S/P total colectomy 07/20/2017  . Dizziness 10/12/2016  . Anemia of chronic disease 10/05/2014  . Colonic inertia s/p abdominal colectomy 09/25/2014 09/25/2014  . Hyperlipidemia 09/17/2008  . Glaucoma 10/10/2006  . Sarcoidosis 06/11/2006  . Schizophrenia, unspecified type ([place]) 06/11/2006  . Mental retardation 06/11/2006  . Chronic systolic CHF (congestive heart failure) ([place]) 06/11/2006  . 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   Past  Medical History:  Diagnosis Date  . ABSCESS, ANAL/RECTAL REGIONS 08/18/2006   Annotation: Fournier gangrene Qualifier: History of  By: Garnette Scheuermann MD, Arlee Muslim    . Cardiomyopathy   . CHF (congestive heart failure) ([place])   . Colonic inertia   . Constipation   . Glaucoma   . Hyperlipidemia   . Hypovolemic shock ([place])   . Incontinent of feces   . Mental retardation   . 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   Annotation: chronic with stercoral ulcer  Qualifier: Diagnosis of  By: Garnette Scheuermann MD, Arlee Muslim    . Obstruction of colon (HCC)    recurrent  . Sarcoidosis   . Schizophrenia ([place])   . 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 Total self-care deficit    per caregiver he needs to have help bathing and cleaning self and needs help with all daily needs now    Past Surgical History:  Procedure Laterality Date  . COLECTOMY N/A 09/25/2014   Procedure: OPEN ABDOMINAL COLECTOMY;  Surgeon: Michael Boston, MD;  Location: [facility];  Service: General;  Laterality: N/A;  . COLONOSCOPY  2007  . groin surgery     boil drained  . MOUTH SURGERY    . PROCTOSCOPY N/A 09/25/2014   Procedure: RIGID PROCTOSCOPY;  Surgeon: Michael Boston, MD;  Location: [facility];  Service: General;  Laterality: N/A;   Current Outpatient Medications  Medication Sig Dispense Refill  . cloZAPine (CLOZARIL) 100 MG tablet Take 250 mg by mouth at bedtime.    . dapagliflozin propanediol (FARXIGA) 10 MG TABS tablet  Take 10 mg by mouth every morning.    . fluvoxaMINE (LUVOX) 100 MG tablet Take 300 mg by mouth at bedtime.    . metoprolol succinate (TOPROL-XL) 25 MG 24 hr tablet Take 25 mg by mouth every morning.    . Multiple Vitamin (MULTIVITAMIN) capsule Take 1 capsule by mouth daily. 90 capsule 3  . pravastatin (PRAVACHOL) 40 MG tablet TAKE ONE TABLET EACH DAY 90 tablet 3  . sacubitril-valsartan (ENTRESTO) 97-103 MG Take 1 tablet by mouth 2 (two) times daily. 180 tablet 0   No current facility-administered medications for this visit.   Allergies  Allergen  Reactions  . Hydrocodone Other (See Comments)    Exacerbation of urinary and fecal incontinence     DENTAL EXAM: Oral Hygiene (PLAQUE): Generalized plaque accumulation LOCATION OF MUCOSITIS: None DESCRIPTION OF SALIVA: Normal saliva consistency and quantity ANY EXPOSED BONE: No OTHER WATCHED AREAS: None   DIAGNOSES: 1. Accretions: He does have generalized plaque accumulation that is significant.  He has difficulty brushing his own teeth, so his caregiver tries to help him.  Recommend he schedule an appointment with his regular dentist as soon as possible for a periodic exam and a cleaning.  Strongly encouraged patient's sister and caregiver to make sure he goes every 6 mos or more to monitor his oral health and to prevent radiation caries.  I also prescribed 12 refills of Prevident 5000 to give him some fluoride treatment every day. 2. Trismus: Down from 35 mm to 25 mm.  He currently denies any symptoms related to limited opening, and if this changes this will be something his regular dentist will be able to monitor.   RECOMMENDATIONS: 1. Brush after meals and at bedtime.  Use fluoride at bedtime. 2. Use trismus exercises as directed. 3. Use CLOSYs OTC Mouthrinse for dry mouth if needed.  Use salt water/baking soda rinses for any mouth sores. 4. Take multiple sips of water as needed. 5. Return to regular dentist for routine dental care including cleanings and periodic exams. 6. Call if any problems or concerns arise.   . All questions and concerns were addressed, and patient, caregiver and patient's sister verbalized understanding of discussion, findings and recommendations.   . Patient tolerated today's visit well and departed in stable condition.   [place] Benson Norway, DMD

## 2020-08-18 LAB — BASIC METABOLIC PANEL
BUN/Creatinine Ratio: 13 (ref 9–20)
BUN: 16 mg/dL (ref 6–24)
CO2: 26 mmol/L (ref 20–29)
Calcium: 9.6 mg/dL (ref 8.7–10.2)
Chloride: 100 mmol/L (ref 96–106)
Creatinine, Ser: 1.22 mg/dL (ref 0.76–1.27)
GFR calc Af Amer: 78 mL/min/{1.73_m2} (ref >59)
GFR calc non Af Amer: 67 mL/min/{1.73_m2} (ref >59)
Glucose: 127 mg/dL — ABNORMAL HIGH (ref 65–99)
Potassium: 4.8 mmol/L (ref 3.5–5.2)
Sodium: 139 mmol/L (ref 134–144)

## 2020-08-18 LAB — MAGNESIUM: Magnesium: 2.2 mg/dL (ref 1.6–2.3)

## 2020-08-18 LAB — PRO B NATRIURETIC PEPTIDE: NT-Pro BNP: 61 pg/mL (ref 0–121)

## 2020-08-24 ENCOUNTER — Ambulatory Visit: Payer: Medicare Other | Admitting: Cardiology

## 2020-09-02 ENCOUNTER — Ambulatory Visit: Payer: Medicare Other | Admitting: Cardiology

## 2020-09-02 ENCOUNTER — Encounter: Payer: Self-pay | Admitting: Cardiology

## 2020-09-02 ENCOUNTER — Other Ambulatory Visit: Payer: Self-pay

## 2020-09-02 VITALS — BP 105/70 | HR 53 | Temp 98.1°F | Resp 16 | Ht 74.0 in | Wt 219.4 lb

## 2020-09-02 DIAGNOSIS — I429 Cardiomyopathy, unspecified: Secondary | ICD-10-CM

## 2020-09-02 DIAGNOSIS — E782 Mixed hyperlipidemia: Secondary | ICD-10-CM

## 2020-09-02 DIAGNOSIS — I5042 Chronic combined systolic (congestive) and diastolic (congestive) heart failure: Secondary | ICD-10-CM

## 2020-09-02 DIAGNOSIS — I4510 Unspecified right bundle-branch block: Secondary | ICD-10-CM

## 2020-09-02 DIAGNOSIS — F209 Schizophrenia, unspecified: Secondary | ICD-10-CM

## 2020-09-02 MED ORDER — SPIRONOLACTONE 25 MG PO TABS
12.5000 mg | ORAL_TABLET | Freq: Every day | ORAL | 0 refills | Status: DC
Start: 2020-09-02 — End: 2020-09-02

## 2020-09-02 MED ORDER — SPIRONOLACTONE 25 MG PO TABS: 12.5000 mg | ORAL_TABLET | Freq: Every morning | ORAL | 0 refills | Status: DC

## 2020-09-02 NOTE — Progress Notes (Signed)
ID:  [name] Branch, DOB [birth year], MRN [id number]  PCP:  [name], MD  Cardiologist:  Rex Kras, DO, [facility] (established care 07/12/2020) Former Cardiology Providers: Dr. Cloretta Ned and Vilinda Flake PA.  Date: 09/02/20 Last Office Visit: 08/03/2020  Chief Complaint  Patient presents with  . Congestive Heart Failure    HPI  [name] Branch is a 55 y.o. 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  He is referred to the office at the request of [name]* for evaluation of cardiomyopathy and chronic congestive heart.  Given his intellectual disability and schizophrenia his is accompanied by his guardian [name] (sister / guardian) [phone] and Shea Evans (caregiver).  Patient and guardian provided verbal consent in regards to discussing his care in the presence of the patient's caregiver.  He was seeing Dr. Edwin Dada at [facility] and re-established care with our practice back in December 2021 for management of cardiomyopathy presumed to be nonischemic and chronic combined systolic and diastolic heart failure.  Based on EMR patient is noted to have cardiomyopathy since 2016 at the time of colon surgery.  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 Patient's sister states that he subsequently underwent surgery and completed  radiation treatment as of 06/16/2020.  At last office visit his Delene Loll was uptitrated to 97/103 mg p.o. twice daily.  Follow-up blood work from August 17, 2020 notes stable kidney function and electrolytes.  His NT proBNP is improved.  He tolerated the medication well without any side effects or intolerances.  He now presents for 1 month follow-up to further uptitrate his guideline directed medical therapy.  Clinically patient states that he does not have any chest pain or heart failure symptoms.  Review of his blood pressure and weight log notes that his systolic blood pressure ranges between 123456 mmHg and diastolic blood pressures are around 70-80 mmHg.  Patient has lost 5 pounds since last office visit.  No urgent care or ER visits since last office visit.  FUNCTIONAL STATUS: Able to ambulate 46minutes five day a week, depending on the weather.   ALLERGIES: Allergies  Allergen Reactions  . Hydrocodone Other (See Comments)    Exacerbation of urinary and fecal incontinence    MEDICATION LIST PRIOR TO VISIT: Current Meds  Medication Sig  . amoxicillin (AMOXIL) 500 MG capsule Take 500 mg by mouth 3 (three) times daily.  . carbamide peroxide (DEBROX) 6.5 % OTIC solution 5 drops 2 (two) times daily.  . cetirizine (ZYRTEC) 10 MG tablet Take 10 mg by mouth daily.  . cloZAPine (CLOZARIL) 100 MG tablet Take 250 mg by mouth at bedtime.  . dapagliflozin propanediol (FARXIGA) 10 MG TABS tablet Take 10 mg by mouth every morning.  Marland Kitchen Dextromethorphan-guaiFENesin (MUCINEX DM) 30-600 MG TB12 Take 1 tablet by mouth 2 (two) times daily.  . metoprolol succinate (TOPROL-XL) 25 MG 24 hr tablet Take 25 mg by mouth every evening.  Marland Kitchen MUCUS RELIEF DM COUGH 20-400 MG TABS Take 10 mg by mouth in the morning  and at bedtime.  . Multiple Vitamin (MULTIVITAMIN) capsule Take 1 capsule by mouth daily.  . ondansetron (ZOFRAN-ODT) 4 MG disintegrating tablet Take by mouth.  . pravastatin (PRAVACHOL) 40 MG tablet TAKE  ONE TABLET EACH DAY  . sacubitril-valsartan (ENTRESTO) 97-103 MG Take 1 tablet by mouth 2 (two) times daily.  . sodium fluoride (PREVIDENT 5000 PLUS) 1.1 % CREA dental cream Use a "grape-size" amount on a soft toothbrush every night.  Do not rinse with water or eat for at least 5 minutes after brushing teeth.  . spironolactone (ALDACTONE) 25 MG tablet Take 0.5 tablets (12.5 mg total) by mouth in the morning.  . [DISCONTINUED] fluvoxaMINE (LUVOX) 100 MG tablet Take 300 mg by mouth at bedtime.  . [DISCONTINUED] spironolactone (ALDACTONE) 25 MG tablet Take 0.5 tablets (12.5 mg total) by mouth daily.     PAST MEDICAL HISTORY: Past Medical History:  Diagnosis Date  . ABSCESS, ANAL/RECTAL REGIONS 08/18/2006   Annotation: Fournier gangrene Qualifier: History of  By: Garnette Scheuermann MD, Arlee Muslim    . Cardiomyopathy   . CHF (congestive heart failure) ([place])   . Colonic inertia   . Constipation   . Glaucoma   . Hyperlipidemia   . Hypovolemic shock ([place])   . Incontinent of feces   . Mental retardation   . 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   Annotation: chronic with stercoral ulcer  Qualifier: Diagnosis of  By: Garnette Scheuermann MD, Arlee Muslim    . Obstruction of colon (HCC)    recurrent  . Sarcoidosis   . Schizophrenia ([place])   . 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 Total self-care deficit    per caregiver he needs to have help bathing and cleaning self and needs help with all daily needs now     PAST SURGICAL HISTORY: Past Surgical History:  Procedure Laterality Date  . COLECTOMY N/A 09/25/2014   Procedure: OPEN ABDOMINAL COLECTOMY;  Surgeon: Michael Boston, MD;  Location: [facility];  Service: General;  Laterality: N/A;  . COLONOSCOPY  2007  . groin surgery     boil drained  . MOUTH SURGERY    . PROCTOSCOPY N/A 09/25/2014   Procedure: RIGID PROCTOSCOPY;  Surgeon: Michael Boston, MD;  Location: [facility];  Service: General;  Laterality: N/A;    FAMILY HISTORY: The patient family history includes Diabetes in his mother.  SOCIAL  HISTORY:  The patient  reports that he has never smoked. He has never used smokeless tobacco. He reports that he does not drink alcohol and does not use drugs.  REVIEW OF SYSTEMS: Review of Systems  Constitutional: Negative for chills and fever.  HENT: Negative for hoarse voice and nosebleeds.   Eyes: Negative for discharge, double vision and pain.  Cardiovascular: Negative for chest pain, claudication, dyspnea on exertion, leg swelling, near-syncope, orthopnea, palpitations, paroxysmal nocturnal dyspnea and syncope.  Respiratory: Negative for hemoptysis and shortness of breath.   Musculoskeletal: Negative for muscle cramps and myalgias.  Gastrointestinal: Negative for abdominal pain, constipation, diarrhea, hematemesis, hematochezia, melena, nausea and vomiting.  Neurological: Negative for dizziness and light-headedness.   PHYSICAL EXAM: Vitals with BMI 09/02/2020 08/11/2020 08/03/2020  Height 6\' 2"  - 6\' 2"   Weight 219 lbs 6 oz 220 lbs 223 lbs  BMI 28.16 97.67 34.19  Systolic 379 024 097  Diastolic 70 66 72  Pulse 53 94 81   CONSTITUTIONAL: Appears older than stated age, hemodynamically stable, no acute distress.  SKIN: Skin is warm and dry. No rash noted. No cyanosis. No pallor. No jaundice HEAD:  Normocephalic and atraumatic.  EYES: No scleral icterus MOUTH/THROAT: Moist oral membranes.  NECK: No JVD present. No thyromegaly noted. No carotid bruits  LYMPHATIC: No visible cervical adenopathy.  CHEST Normal respiratory effort. No intercostal retractions  LUNGS: Clear to auscultation bilaterally. No stridor. No wheezes. No rales.  CARDIOVASCULAR: Regular rate and rhythm, positive S1-S2, no murmurs rubs or gallops appreciated. ABDOMINAL: No apparent ascites.  EXTREMITIES: No peripheral edema, 2+ dorsalis pedis and posterior tibial pulses. HEMATOLOGIC: No significant bruising NEUROLOGIC: Oriented to person, place, and time. Nonfocal. Normal muscle tone.  PSYCHIATRIC: Normal mood and  affect. Normal behavior. Cooperative  CARDIAC DATABASE: EKG: 07/12/2020: Normal sinus rhythm, 76bpm, right bundle branch block, left axis deviation, left anterior fascicular, without underlying injury pattern.   Echocardiogram: Echo 03/10/2020 and [facility] ([facility] MODERATE LV DYSFUNCTION EF 35%, LV normal size NORMAL RIGHT VENTRICULAR SYSTOLIC FUNCTION VALVULAR REGURGITATION: MILD AR, TRIVIAL MR, TRIVIAL PR, TRIVIAL TR NO VALVULAR STENOSIS  Stress Testing: No results found for this or any previous visit from the past 1095 days.  Heart Catheterization: None  LABORATORY DATA: CBC Latest Ref Rng & Units 07/28/2020 02/19/2018 12/30/2017  WBC 4.0 - 10.5 K/uL 4.1 4.8 6.9  Hemoglobin 13.0 - 17.0 g/dL 14.7 13.9 13.0  Hematocrit 39.0 - 52.0 % 48.7 42.7 42.0  Platelets 150 - 400 K/uL 161 163.0 169    CMP Latest Ref Rng & Units 08/17/2020 07/28/2020 07/14/2020  Glucose 65 - 99 mg/dL 127(H) 111(H) 87  BUN 6 - 24 mg/dL 16 18 13   Creatinine 0.76 - 1.27 mg/dL 1.22 1.22 1.11  Sodium 134 - 144 mmol/L 139 138 140  Potassium 3.5 - 5.2 mmol/L 4.8 4.6 4.4  Chloride 96 - 106 mmol/L 100 105 103  CO2 20 - 29 mmol/L 26 25 26   Calcium 8.7 - 10.2 mg/dL 9.6 9.1 9.3  Total Protein 6.0 - 8.3 g/dL - - -  Total Bilirubin 0.2 - 1.2 mg/dL - - -  Alkaline Phos 39 - 117 U/L - - -  AST 0 - 37 U/L - - -  ALT 0 - 53 U/L - - -    Lipid Panel     Component Value Date/Time   CHOL 133 03/25/2018 1155   TRIG 64 03/25/2018 1155   HDL 59 03/25/2018 1155   CHOLHDL 2.3 03/25/2018 1155   CHOLHDL 3.4 02/25/2014 1034   VLDL 11 02/25/2014 1034   LDLCALC 61 03/25/2018 1155   LABVLDL 13 03/25/2018 1155    No components found for: NTPROBNP Recent Labs    07/14/20 1559 08/17/20 1230  PROBNP 102 61   No results for input(s): TSH in the last 8760 hours.  BMP Recent Labs    07/14/20 1559 07/28/20 2234 08/17/20 1230  NA 140 138 139  K 4.4 4.6 4.8  CL 103 105 100  CO2 26 25 26   GLUCOSE 87 111* 127*   BUN 13 18 16   CREATININE 1.11 1.22 1.22  CALCIUM 9.3 9.1 9.6  GFRNONAA 75 >60 67  GFRAA 87  --  78    HEMOGLOBIN A1C Lab Results  Component Value Date   HGBA1C 6.0 (H) 09/18/2014   MPG 126 09/18/2014    IMPRESSION:    ICD-10-CM   1. Chronic combined systolic and diastolic CHF, NYHA class 2 (HCC)  O96.29 Basic metabolic panel    Pro b natriuretic peptide (BNP)    Magnesium    spironolactone (ALDACTONE) 25 MG tablet    Magnesium    Pro b natriuretic  peptide (BNP)    Basic metabolic panel    DISCONTINUED: spironolactone (ALDACTONE) 25 MG tablet  2. Cardiomyopathy, unspecified type ([place])  I42.9   3. Mixed hyperlipidemia  E78.2   4. RBBB  I45.10   5. Schizophrenia, unspecified type ([place])  F20.9      RECOMMENDATIONS: Macklan Guagliardo is a 55 y.o. 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  Chronic combined systolic and diastolic heart failure, NYHA class II, stage B: Heart failure medications reconciled. Independently reviewed labs from August 17, 2020 with the patient and his sister.   Initiate Aldactone 12.5 mg p.o. every morning. Patient is advised to take Toprol XL in the evening.   We will check blood work in 1 week to evaluate kidney function and electrolytes.   If the lab work is within normal limits the plan will be to continue on the current guideline directed medical therapy for 90 days and a follow-up echo will be ordered.  I will see the patient after the echo is performed around June 2022.    Cardiomyopathy, presumed NICMP: See  above.   Hyperlipidemia, mixed: Continue statin.  Currently managed by primary care provider.  RBBB: Continue monitor.   Continue follow-up with PCP regarding the management of his other chronic comorbid conditions.  Plan of care discussed with both the patient, caregiver, and patient's guardian.   FINAL MEDICATION LIST END OF ENCOUNTER:   Current Outpatient Medications:  .  amoxicillin (AMOXIL) 500 MG capsule, Take 500 mg by mouth 3 (three) times daily., Disp: , Rfl:  .  carbamide peroxide (DEBROX) 6.5 % OTIC solution, 5 drops 2 (two) times daily., Disp: , Rfl:  .  cetirizine (ZYRTEC) 10 MG tablet, Take 10 mg by mouth daily., Disp: , Rfl:  .  cloZAPine (CLOZARIL) 100 MG tablet, Take 250 mg by mouth at bedtime., Disp: , Rfl:  .  dapagliflozin propanediol (FARXIGA) 10 MG TABS tablet, Take 10 mg by mouth every morning., Disp: , Rfl:  .  Dextromethorphan-guaiFENesin (MUCINEX DM) 30-600 MG TB12, Take 1 tablet by mouth 2 (two) times daily., Disp: , Rfl:  .  metoprolol succinate (TOPROL-XL) 25 MG 24 hr tablet, Take 25 mg by mouth every evening., Disp: , Rfl:  .  MUCUS RELIEF DM COUGH 20-400 MG TABS, Take 10 mg by mouth in the morning and at bedtime., Disp: , Rfl:  .  Multiple Vitamin (MULTIVITAMIN) capsule, Take 1 capsule by mouth daily., Disp: 90 capsule, Rfl: 3 .  ondansetron (ZOFRAN-ODT) 4 MG disintegrating tablet, Take by mouth., Disp: , Rfl:  .  pravastatin (PRAVACHOL) 40 MG tablet, TAKE ONE TABLET EACH DAY, Disp: 90 tablet, Rfl: 3 .  sacubitril-valsartan (ENTRESTO) 97-103 MG, Take 1 tablet by mouth 2 (two) times daily., Disp: 180 tablet, Rfl: 0 .  sodium fluoride (PREVIDENT 5000 PLUS) 1.1 % CREA dental cream, Use a "grape-size" amount on a soft toothbrush every night.  Do not rinse with water or eat for at least 5 minutes after brushing teeth., Disp: 51 g, Rfl: 12 .  spironolactone (ALDACTONE) 25 MG tablet, Take 0.5 tablets (12.5 mg total) by mouth in the morning., Disp: 45 tablet, Rfl:  0  Orders  Placed This Encounter  Procedures  . Basic metabolic panel  . Pro b natriuretic peptide (BNP)  . Magnesium   There are no Patient Instructions on file for this visit.   --Continue cardiac medications as reconciled in final medication list. --Return in about 4 months (around 12/29/2020) for Follow up, heart failure management.. Or sooner if needed. --Continue follow-up with your primary care physician regarding the management of your other chronic comorbid conditions.  Patient's questions and concerns were addressed to his satisfaction. He voices understanding of the instructions provided during this encounter.   This note was created using a voice recognition software as a result there may be grammatical errors inadvertently enclosed that do not reflect the nature of this encounter. Every attempt is made to correct such errors.  Rex Kras, [place], [facility]  Pager: [phone] Office: [phone]

## 2020-09-14 LAB — BASIC METABOLIC PANEL
BUN/Creatinine Ratio: 14 (ref 9–20)
BUN: 17 mg/dL (ref 6–24)
CO2: 20 mmol/L (ref 20–29)
Calcium: 9.3 mg/dL (ref 8.7–10.2)
Chloride: 103 mmol/L (ref 96–106)
Creatinine, Ser: 1.21 mg/dL (ref 0.76–1.27)
GFR calc Af Amer: 78 mL/min/{1.73_m2} (ref >59)
GFR calc non Af Amer: 67 mL/min/{1.73_m2} (ref >59)
Glucose: 90 mg/dL (ref 65–99)
Potassium: 4.7 mmol/L (ref 3.5–5.2)
Sodium: 138 mmol/L (ref 134–144)

## 2020-09-14 LAB — PRO B NATRIURETIC PEPTIDE: NT-Pro BNP: 30 pg/mL (ref 0–121)

## 2020-09-14 LAB — MAGNESIUM: Magnesium: 2.4 mg/dL — ABNORMAL HIGH (ref 1.6–2.3)

## 2020-09-20 ENCOUNTER — Telehealth: Payer: Self-pay | Admitting: *Deleted

## 2020-09-20 NOTE — Progress Notes (Signed)
Called and spoke with pts legal guardian [name] Branch regarding test results. She voiced understanding.

## 2020-09-20 NOTE — Telephone Encounter (Signed)
Called patient to inform of CT for 09-28-20- arrival time- 2:15 pm @ [facility], patient to have water only - 4 hrs. prior to test, patient to receive results from [name] on 09-29-20 @ 11 am, spoke with patient's legal guardian - [name] and she is aware of these appts.

## 2020-09-25 ENCOUNTER — Emergency Department (HOSPITAL_COMMUNITY): Payer: Medicare Other

## 2020-09-25 ENCOUNTER — Emergency Department (HOSPITAL_COMMUNITY)
Admission: EM | Admit: 2020-09-25 | Discharge: 2020-09-25 | Disposition: A | Discharge status: Home / Self Care | Payer: Medicare Other | Attending: Emergency Medicine | Admitting: Emergency Medicine

## 2020-09-25 ENCOUNTER — Encounter (HOSPITAL_COMMUNITY): Payer: Self-pay

## 2020-09-25 ENCOUNTER — Other Ambulatory Visit: Payer: Self-pay

## 2020-09-25 DIAGNOSIS — E860 Dehydration: Secondary | ICD-10-CM | POA: Insufficient documentation

## 2020-09-25 DIAGNOSIS — R634 Abnormal weight loss: Secondary | ICD-10-CM | POA: Insufficient documentation

## 2020-09-25 DIAGNOSIS — Z79899 Other long term (current) drug therapy: Secondary | ICD-10-CM | POA: Diagnosis not present

## 2020-09-25 DIAGNOSIS — Z85818 Personal history of malignant neoplasm of other sites of lip, oral cavity, and pharynx: Secondary | ICD-10-CM | POA: Diagnosis not present

## 2020-09-25 DIAGNOSIS — Y92009 Unspecified place in unspecified non-institutional (private) residence as the place of occurrence of the external cause: Secondary | ICD-10-CM | POA: Diagnosis not present

## 2020-09-25 DIAGNOSIS — W19XXXA Unspecified fall, initial encounter: Secondary | ICD-10-CM | POA: Diagnosis not present

## 2020-09-25 DIAGNOSIS — R42 Dizziness and giddiness: Secondary | ICD-10-CM | POA: Insufficient documentation

## 2020-09-25 DIAGNOSIS — Z043 Encounter for examination and observation following other accident: Secondary | ICD-10-CM | POA: Diagnosis present

## 2020-09-25 DIAGNOSIS — I5022 Chronic systolic (congestive) heart failure: Secondary | ICD-10-CM | POA: Insufficient documentation

## 2020-09-25 LAB — CBC WITH DIFFERENTIAL/PLATELET
Abs Immature Granulocytes: 0.01 10*3/uL (ref 0.00–0.07)
Basophils Absolute: 0 10*3/uL (ref 0.0–0.1)
Basophils Relative: 0 %
Eosinophils Absolute: 0.1 10*3/uL (ref 0.0–0.5)
Eosinophils Relative: 2 %
HCT: 43.3 % (ref 39.0–52.0)
Hemoglobin: 13.8 g/dL (ref 13.0–17.0)
Immature Granulocytes: 0 %
Lymphocytes Relative: 20 %
Lymphs Abs: 0.9 10*3/uL (ref 0.7–4.0)
MCH: 30.5 pg (ref 26.0–34.0)
MCHC: 31.9 g/dL (ref 30.0–36.0)
MCV: 95.8 fL (ref 80.0–100.0)
Monocytes Absolute: 0.5 10*3/uL (ref 0.1–1.0)
Monocytes Relative: 12 %
Neutro Abs: 2.9 10*3/uL (ref 1.7–7.7)
Neutrophils Relative %: 66 %
Platelets: 145 10*3/uL — ABNORMAL LOW (ref 150–400)
RBC: 4.52 MIL/uL (ref 4.22–5.81)
RDW: 14.1 % (ref 11.5–15.5)
WBC: 4.5 10*3/uL (ref 4.0–10.5)
nRBC: 0 % (ref 0.0–0.2)

## 2020-09-25 LAB — COMPREHENSIVE METABOLIC PANEL
ALT: 63 U/L — ABNORMAL HIGH (ref 0–44)
AST: 42 U/L — ABNORMAL HIGH (ref 15–41)
Albumin: 4.2 g/dL (ref 3.5–5.0)
Alkaline Phosphatase: 109 U/L (ref 38–126)
Anion gap: 9 (ref 5–15)
BUN: 16 mg/dL (ref 6–20)
CO2: 30 mmol/L (ref 22–32)
Calcium: 9.5 mg/dL (ref 8.9–10.3)
Chloride: 97 mmol/L — ABNORMAL LOW (ref 98–111)
Creatinine, Ser: 1.24 mg/dL (ref 0.61–1.24)
GFR, Estimated: >60 mL/min (ref >60)
Glucose, Bld: 90 mg/dL (ref 70–99)
Potassium: 4.1 mmol/L (ref 3.5–5.1)
Sodium: 136 mmol/L (ref 135–145)
Total Bilirubin: 0.7 mg/dL (ref 0.3–1.2)
Total Protein: 7.8 g/dL (ref 6.5–8.1)

## 2020-09-25 LAB — URINALYSIS, ROUTINE W REFLEX MICROSCOPIC
Bilirubin Urine: NEGATIVE
Glucose, UA: 150 mg/dL — AB
Hgb urine dipstick: NEGATIVE
Ketones, ur: NEGATIVE mg/dL
Leukocytes,Ua: NEGATIVE
Nitrite: NEGATIVE
Protein, ur: NEGATIVE mg/dL
Specific Gravity, Urine: 1.02 (ref 1.005–1.030)
pH: 7 (ref 5.0–8.0)

## 2020-09-25 MED ORDER — SODIUM CHLORIDE 0.9 % IV BOLUS
1000.0000 mL | Freq: Once | INTRAVENOUS | Status: AC
  Administered 2020-09-25: 1000 mL via INTRAVENOUS

## 2020-09-25 NOTE — ED Provider Notes (Signed)
[facility] DEPT Provider Note   CSN: [id number] Arrival date & time: 09/25/20  1556     History Chief Complaint  Patient presents with  . Fall    [name] Branch is a 55 y.o. male.  HPI Patient presents for evaluation of fall.  He was with his caregiver at her home, when the patient fell, unwitnessed.  The caregiver heard him fall and came to his aid finding him on the floor, not near anything.  The patient was able to get up with assistance and sit on bed.  About 5 minutes later he stood up and was dizzy, so the caregiver brought him here for evaluation.  His behavior has been unchanged since the fall.  According to the caregiver who gives history, because the patient cannot, the patient has been otherwise well.  There has been no fever, chills, cough, shortness of breath, nausea, vomiting or change in ability to ambulate.  Patient has not been sick recently.  There are no other known modifying factors.    Past Medical History:  Diagnosis Date  . ABSCESS, ANAL/RECTAL REGIONS 08/18/2006   Annotation: Fournier gangrene Qualifier: History of  By: Garnette Scheuermann MD, Arlee Muslim    . Cardiomyopathy   . CHF (congestive heart failure) ([place])   . Colonic inertia   . Constipation   . Glaucoma   . Hyperlipidemia   . Hypovolemic shock ([place])   . Incontinent of feces   . Mental retardation   . 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   Annotation: chronic with stercoral ulcer  Qualifier: Diagnosis of  By: Garnette Scheuermann MD, Arlee Muslim    . Obstruction of colon (HCC)    recurrent  . Sarcoidosis   . Schizophrenia ([place])   . 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 Total self-care deficit    per caregiver he needs to have help bathing and cleaning self and needs help with all daily needs now     Patient Active Problem List   Diagnosis Date Noted  . 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 Incontinent of feces   . Total self-care deficit   . S/P total colectomy 07/20/2017  . Dizziness 10/12/2016  .  Anemia of chronic disease 10/05/2014  . Colonic inertia s/p abdominal colectomy 09/25/2014 09/25/2014  . Hyperlipidemia 09/17/2008  . Glaucoma 10/10/2006  . Sarcoidosis 06/11/2006  . Schizophrenia, unspecified type ([place]) 06/11/2006  . Mental retardation 06/11/2006  . Chronic systolic CHF (congestive heart failure) ([place]) 06/11/2006  . 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    Past Surgical History:  Procedure Laterality Date  . COLECTOMY N/A 09/25/2014   Procedure: OPEN ABDOMINAL COLECTOMY;  Surgeon: Michael Boston, MD;  Location: [facility] ORS;  Service: General;  Laterality: N/A;  . COLONOSCOPY  2007  . groin surgery     boil drained  . MOUTH SURGERY    . PROCTOSCOPY N/A 09/25/2014   Procedure: RIGID PROCTOSCOPY;  Surgeon: Michael Boston, MD;  Location: [facility];  Service: General;  Laterality: N/A;       Family History  Problem Relation Age of Onset  . Diabetes Mother   . Colon cancer Neg Hx   . Colon polyps Neg Hx   . Esophageal cancer Neg Hx   . Rectal cancer Neg Hx   . Stomach cancer Neg Hx     Social History   Tobacco Use  . Smoking status: Never Smoker  . Smokeless tobacco: Never Used  Vaping Use  . Vaping Use: Never used  Substance Use Topics  .  Alcohol use: No    Alcohol/week: 0.0 standard drinks  . Drug use: No    Home Medications Prior to Admission medications   Medication Sig Start Date End Date Taking? Authorizing Provider  amoxicillin (AMOXIL) 500 MG capsule Take 500 mg by mouth 3 (three) times daily. 08/30/20   [provider]  carbamide peroxide (DEBROX) 6.5 % OTIC solution 5 drops 2 (two) times daily.    [provider]  cetirizine (ZYRTEC) 10 MG tablet Take 10 mg by mouth daily. 08/30/20   [provider]  cloZAPine (CLOZARIL) 100 MG tablet Take 250 mg by mouth at bedtime.    [provider]  dapagliflozin propanediol (FARXIGA) 10 MG TABS tablet Take 10 mg by mouth every morning. 04/19/20 04/19/21  [provider]   Dextromethorphan-guaiFENesin (MUCINEX DM) 30-600 MG TB12 Take 1 tablet by mouth 2 (two) times daily. 08/31/20   [provider]  metoprolol succinate (TOPROL-XL) 25 MG 24 hr tablet Take 25 mg by mouth every evening. 03/11/20 03/11/21  [provider]  MUCUS RELIEF DM COUGH 20-400 MG TABS Take 10 mg by mouth in the morning and at bedtime. 08/31/20   [provider]  Multiple Vitamin (MULTIVITAMIN) capsule Take 1 capsule by mouth daily. 03/25/18   Ladell Pier, MD  ondansetron (ZOFRAN-ODT) 4 MG disintegrating tablet Take by mouth. 08/30/20   [provider]  pravastatin (PRAVACHOL) 40 MG tablet TAKE ONE TABLET EACH DAY 03/25/18   Ladell Pier, MD  sacubitril-valsartan (ENTRESTO) 97-103 MG Take 1 tablet by mouth 2 (two) times daily. 08/03/20 11/01/20  Tolia, Sunit, DO  sodium fluoride (PREVIDENT 5000 PLUS) 1.1 % CREA dental cream Use a "grape-size" amount on a soft toothbrush every night.  Do not rinse with water or eat for at least 5 minutes after brushing teeth. 08/11/20 08/11/21  Sandi Mariscal B, DMD  spironolactone (ALDACTONE) 25 MG tablet Take 0.5 tablets (12.5 mg total) by mouth in the morning. 09/02/20 09/25/20  Rex Kras, DO    Allergies    Hydrocodone  Review of Systems   Review of Systems  All other systems reviewed and are negative.   Physical Exam Updated Vital Signs BP 116/75   Pulse 76   Temp 97.7 F (36.5 C) (Oral)   Resp (!) 21   Ht 6\' 2"  (1.88 m)   Wt (!) 217.3 kg   SpO2 99%   BMI 61.51 kg/m   Physical Exam Vitals and nursing note reviewed.  Constitutional:      General: He is not in acute distress.    Appearance: He is well-developed and well-nourished. He is not ill-appearing, toxic-appearing or diaphoretic.  HENT:     Head: Normocephalic and atraumatic.     Right Ear: External ear normal.     Left Ear: External ear normal.  Eyes:     Extraocular Movements: EOM normal.     Conjunctiva/sclera: Conjunctivae normal.      Pupils: Pupils are equal, round, and reactive to light.  Neck:     Trachea: Phonation normal.  Cardiovascular:     Rate and Rhythm: Normal rate and regular rhythm.     Heart sounds: Normal heart sounds.  Pulmonary:     Effort: Pulmonary effort is normal.     Breath sounds: Normal breath sounds.  Chest:     Chest wall: No bony tenderness.  Abdominal:     Palpations: Abdomen is soft.     Tenderness: There is no abdominal tenderness.  Musculoskeletal:  General: Normal range of motion.     Cervical back: Normal range of motion and neck supple.  Skin:    General: Skin is warm, dry and intact.  Neurological:     Mental Status: He is alert.     Cranial Nerves: No cranial nerve deficit.     Sensory: No sensory deficit.     Motor: No abnormal muscle tone.     Coordination: Coordination normal.     Comments: No dysarthria or aphasia, confused.  Psychiatric:        Mood and Affect: Mood and affect and mood normal.        Behavior: Behavior normal.     ED Results / Procedures / Treatments   Labs (all labs ordered are listed, but only abnormal results are displayed) Labs Reviewed  URINALYSIS, ROUTINE W REFLEX MICROSCOPIC - Abnormal; Notable for the following components:      Result Value   Glucose, UA 150 (*)    All other components within normal limits  COMPREHENSIVE METABOLIC PANEL - Abnormal; Notable for the following components:   Chloride 97 (*)    AST 42 (*)    ALT 63 (*)    All other components within normal limits  CBC WITH DIFFERENTIAL/PLATELET - Abnormal; Notable for the following components:   Platelets 145 (*)    All other components within normal limits    EKG None  Radiology DG Chest 2 View  Result Date: 09/25/2020 CLINICAL DATA:  Status post fall. History of congestive heart failure. EXAM: CHEST - 2 VIEW COMPARISON:  CT chest 03/05/2020, chest x-ray 07/28/2020 FINDINGS: The heart size and mediastinal contours are unchanged. Right base linear atelectasis  versus scarring. No focal consolidation. No pulmonary edema. No pleural effusion. No pneumothorax. No acute osseous abnormality. IMPRESSION: No active cardiopulmonary disease. Electronically Signed   By: Iven Finn M.D.   On: 09/25/2020 17:11   CT Head Wo Contrast  Result Date: 09/25/2020 CLINICAL DATA:  Fall. EXAM: CT HEAD WITHOUT CONTRAST CT CERVICAL SPINE WITHOUT CONTRAST TECHNIQUE: Multidetector CT imaging of the head and cervical spine was performed following the standard protocol without intravenous contrast. Multiplanar CT image reconstructions of the cervical spine were also generated. COMPARISON:  December 30, 2017. FINDINGS: CT HEAD FINDINGS Brain: Mild chronic ischemic white matter disease is noted. No mass effect or midline shift is noted. Ventricular size is within normal limits. There is no evidence of mass lesion, hemorrhage or acute infarction. Vascular: No hyperdense vessel or unexpected calcification. Skull: Normal. Negative for fracture or focal lesion. Sinuses/Orbits: Right maxillary and ethmoid sinusitis is noted. Other: None. CT CERVICAL SPINE FINDINGS Alignment: Mild reversal of normal lordosis is noted which most likely is positional in origin. No spondylolisthesis is noted. Skull base and vertebrae: No acute fracture. No primary bone lesion or focal pathologic process. Soft tissues and spinal canal: No prevertebral fluid or swelling. No visible canal hematoma. Disc levels: Mild to moderate degenerative disc disease is noted at C2-3, C3-4, C4-5, C5-6 and C6-7 with anterior and posterior osteophyte formation. Upper chest: Negative. Other: None. IMPRESSION: 1. Mild chronic ischemic white matter disease. Right maxillary and ethmoid sinusitis. No acute intracranial abnormality seen. 2. Multilevel degenerative disc disease. No acute abnormality seen in the cervical spine. Electronically Signed   By: Marijo Conception M.D.   On: 09/25/2020 17:03   CT Cervical Spine Wo Contrast  Result Date:  09/25/2020 CLINICAL DATA:  Fall. EXAM: CT HEAD WITHOUT CONTRAST CT CERVICAL SPINE WITHOUT CONTRAST TECHNIQUE:  Multidetector CT imaging of the head and cervical spine was performed following the standard protocol without intravenous contrast. Multiplanar CT image reconstructions of the cervical spine were also generated. COMPARISON:  December 30, 2017. FINDINGS: CT HEAD FINDINGS Brain: Mild chronic ischemic white matter disease is noted. No mass effect or midline shift is noted. Ventricular size is within normal limits. There is no evidence of mass lesion, hemorrhage or acute infarction. Vascular: No hyperdense vessel or unexpected calcification. Skull: Normal. Negative for fracture or focal lesion. Sinuses/Orbits: Right maxillary and ethmoid sinusitis is noted. Other: None. CT CERVICAL SPINE FINDINGS Alignment: Mild reversal of normal lordosis is noted which most likely is positional in origin. No spondylolisthesis is noted. Skull base and vertebrae: No acute fracture. No primary bone lesion or focal pathologic process. Soft tissues and spinal canal: No prevertebral fluid or swelling. No visible canal hematoma. Disc levels: Mild to moderate degenerative disc disease is noted at C2-3, C3-4, C4-5, C5-6 and C6-7 with anterior and posterior osteophyte formation. Upper chest: Negative. Other: None. IMPRESSION: 1. Mild chronic ischemic white matter disease. Right maxillary and ethmoid sinusitis. No acute intracranial abnormality seen. 2. Multilevel degenerative disc disease. No acute abnormality seen in the cervical spine. Electronically Signed   By: Marijo Conception M.D.   On: 09/25/2020 17:03    Procedures Procedures   Medications Ordered in ED Medications  sodium chloride 0.9 % bolus 1,000 mL (0 mLs Intravenous Stopped 09/25/20 2032)    ED Course  I have reviewed the triage vital signs and the nursing notes.  Pertinent labs & imaging results that were available during my care of the patient were reviewed by me  and considered in my medical decision making (see chart for details).    MDM Rules/Calculators/A&P                           Patient Vitals for the past 24 hrs:  BP Temp Temp src Pulse Resp SpO2 Height Weight  09/25/20 2000 116/75 -- -- 76 (!) 21 99 % -- --  09/25/20 1945 123/65 -- -- 82 19 100 % -- --  09/25/20 1930 116/81 -- -- 75 19 100 % -- --  09/25/20 1915 -- -- -- 72 18 100 % -- --  09/25/20 1900 121/80 -- -- 64 18 100 % -- --  09/25/20 1845 -- -- -- 71 15 100 % -- --  09/25/20 1831 120/76 -- -- 72 17 100 % -- --  09/25/20 1830 120/76 -- -- 72 17 100 % -- --  09/25/20 1815 -- -- -- 74 16 100 % -- --  09/25/20 1800 112/72 -- -- 71 18 100 % -- --  09/25/20 1749 -- -- -- -- -- -- 6\' 2"  (1.88 m) (!) 217.3 kg  09/25/20 1645 -- -- -- 81 16 98 % -- --  09/25/20 1630 -- -- -- 83 18 95 % -- --  09/25/20 1615 93/68 -- -- 92 17 99 % -- --  09/25/20 1602 108/69 97.7 F (36.5 C) Oral 88 18 100 % -- --    8:29 PM Reevaluation with update and discussion. After initial assessment and treatment, an updated evaluation reveals patient has been able to eat and drink here, he appears to be at his baseline according to the patient's sister who is with him and plans on taking him home.  Findings discussed and questions answered. Daleen Bo   Medical Decision Making:  This patient is  presenting for evaluation of fall, which does require a range of treatment options, and is a complaint that involves a moderate risk of morbidity and mortality. The differential diagnoses include acute injury, acute illness, metabolic disorder, hemodynamic instability. I decided to review old records, and in summary middle-aged male, who requires a caregiver because of mental disability, and difficulty communicating, presenting for evaluation of unwitnessed fall.  I obtained additional historical information from caregiver at bedside, sister at bedside.  Clinical Laboratory Tests Ordered, included CBC, Metabolic  panel, Urinalysis and Covid and flu tests. Review indicates reassuring without significant acute abnormalities. Radiologic Tests Ordered, included CT head and cervical spine, chest x-ray.  I independently Visualized: Radiographic images, which show no acute abnormalities, arthritis Lorna    Critical Interventions-clinical evaluation, after testing, radiography, orthostatic blood pressure and pulse, IV fluid bolus, observation, oral nutrition and fluids, discussion with patient's sister who is his legal guardian, and in the room.  After These Interventions, the Patient was reevaluated and was found lessers injury from fall.  Patient with weight loss, 5.7 pounds since 09/02/2020.  Patient takes small dose of spironolactone in the morning.  No serious injuries from fall.  Patient can be maintained at home with increased fluid offerings, and cessation of diuretic medication.  I recommend follow-up with his cardiologist since he has heart failure and is being managed by them closely.  Doubt serious bacterial illness, metabolic instability or impending vascular collapse.  Patient with mild baseline lab abnormalities, not requiring intervention at this time.  CRITICAL CARE-no Performed by: Daleen Bo  Nursing Notes Reviewed/ Care Coordinated Applicable Imaging Reviewed Interpretation of Laboratory Data incorporated into ED treatment  The patient appears reasonably screened and/or stabilized for discharge and I doubt any other medical condition or other [facility] requiring further screening, evaluation, or treatment in the ED at this time prior to discharge.  Plan: Home Medications-continue usual except stop spironolactone for now; Home Treatments-push oral fluids, eat 3 meals a day; return here if the recommended treatment, does not improve the symptoms; Recommended follow up-cardiology follow-up 3 to 4 days.  Return here if needed.     Final Clinical Impression(s) / ED Diagnoses Final diagnoses:   Fall, initial encounter  Dehydration  Weight loss    Rx / DC Orders ED Discharge Orders    None       Daleen Bo, MD 09/25/20 2033

## 2020-09-25 NOTE — ED Notes (Signed)
Patient ambulated in the hall. Patient was able to ambulate but had to hold RN hand. When asked to see how he could do walking on his own, the patient walked a few feet before reaching again for the RN's hand. Patient reported feeling dizzy while walking.

## 2020-09-25 NOTE — ED Triage Notes (Addendum)
Pt arrived via walk in, mechanical fall at home, no LOC, no head strike. Caretaker states pt has been very unsteady on his feet recently.

## 2020-09-25 NOTE — Discharge Instructions (Signed)
Testing indicates his dizziness and fall were likely related to weight loss of 5.7 pounds.  This means that he is probably dehydrated.  Stop taking the spironolactone, also known as Aldactone, until following up with his cardiologist, to discuss further care.  Call the cardiologist for an appointment to be seen in 3 to 4 days.  Encouraged him to get plenty of rest, drink a lot of fluids and eat 3 meals each day.

## 2020-09-27 ENCOUNTER — Telehealth: Payer: Self-pay

## 2020-09-27 NOTE — Telephone Encounter (Signed)
Patient's guardian [name] left a VM asking if patient still needed to have CT scans done tomorrow for his F/U with [name] on Wednesday, because he had scans done over the weekend after he went to ED post fall.   Relayed question to [name] who stated that the scans she ordered will show different anatomy than those done over the weekend, and therefore are still needed.   Returned [name]'s call and left a VM relaying above information. Provided direct call back number should she have any other questions/concerns on patient's behalf.

## 2020-09-28 ENCOUNTER — Other Ambulatory Visit: Payer: Self-pay

## 2020-09-28 ENCOUNTER — Ambulatory Visit (HOSPITAL_COMMUNITY)
Admission: RE | Admit: 2020-09-28 | Discharge: 2020-09-28 | Disposition: A | Discharge status: Home / Self Care | Payer: Medicare Other | Source: Ambulatory Visit | Attending: Radiation Oncology | Admitting: Radiation Oncology

## 2020-09-28 DIAGNOSIS — C058 Malignant neoplasm of overlapping sites of palate: Secondary | ICD-10-CM | POA: Insufficient documentation

## 2020-09-28 MED ORDER — IOHEXOL 300 MG/ML  SOLN
100.0000 mL | Freq: Once | INTRAMUSCULAR | Status: AC | PRN
  Administered 2020-09-28: 100 mL via INTRAVENOUS

## 2020-09-29 ENCOUNTER — Encounter: Payer: Self-pay | Admitting: Licensed Clinical Social Worker

## 2020-09-29 ENCOUNTER — Ambulatory Visit
Admission: RE | Admit: 2020-09-29 | Discharge: 2020-09-29 | Disposition: A | Discharge status: Home / Self Care | Payer: Medicare Other | Source: Ambulatory Visit | Attending: Radiation Oncology | Admitting: Radiation Oncology

## 2020-09-29 ENCOUNTER — Other Ambulatory Visit: Payer: Self-pay

## 2020-09-29 VITALS — BP 90/54 | HR 97 | Temp 97.0°F | Resp 18 | Ht 74.0 in | Wt 219.4 lb

## 2020-09-29 DIAGNOSIS — N200 Calculus of kidney: Secondary | ICD-10-CM | POA: Diagnosis not present

## 2020-09-29 DIAGNOSIS — E860 Dehydration: Secondary | ICD-10-CM | POA: Insufficient documentation

## 2020-09-29 DIAGNOSIS — M47812 Spondylosis without myelopathy or radiculopathy, cervical region: Secondary | ICD-10-CM | POA: Diagnosis not present

## 2020-09-29 DIAGNOSIS — M50321 Other cervical disc degeneration at C4-C5 level: Secondary | ICD-10-CM | POA: Diagnosis not present

## 2020-09-29 DIAGNOSIS — C050 Malignant neoplasm of hard palate: Secondary | ICD-10-CM

## 2020-09-29 DIAGNOSIS — I509 Heart failure, unspecified: Secondary | ICD-10-CM | POA: Diagnosis not present

## 2020-09-29 DIAGNOSIS — J322 Chronic ethmoidal sinusitis: Secondary | ICD-10-CM | POA: Diagnosis not present

## 2020-09-29 DIAGNOSIS — Z79899 Other long term (current) drug therapy: Secondary | ICD-10-CM | POA: Insufficient documentation

## 2020-09-29 DIAGNOSIS — J320 Chronic maxillary sinusitis: Secondary | ICD-10-CM | POA: Diagnosis not present

## 2020-09-29 DIAGNOSIS — I6782 Cerebral ischemia: Secondary | ICD-10-CM | POA: Diagnosis not present

## 2020-09-29 DIAGNOSIS — R9082 White matter disease, unspecified: Secondary | ICD-10-CM | POA: Insufficient documentation

## 2020-09-29 DIAGNOSIS — R6889 Other general symptoms and signs: Secondary | ICD-10-CM

## 2020-09-29 DIAGNOSIS — C058 Malignant neoplasm of overlapping sites of palate: Secondary | ICD-10-CM | POA: Insufficient documentation

## 2020-09-29 DIAGNOSIS — R911 Solitary pulmonary nodule: Secondary | ICD-10-CM | POA: Diagnosis not present

## 2020-09-29 NOTE — Progress Notes (Signed)
Mr. Cudmore presents today for follow-up after completing radiation to his palate on 06/16/2020, and to review CT scan results from 09/29/2020  Pain issues, if any: Patient denies (sister/guardian confirms that patient doesn't mention pain while at home) Using a feeding tube?: N/A Weight changes, if any:  Wt Readings from Last 3 Encounters:  09/29/20 219 lb 6 oz (99.5 kg)  09/25/20 (!) 479 lb 1 oz (217.3 kg)  09/02/20 219 lb 6.4 oz (99.5 kg)   Swallowing issues, if any: Patient and sister deny. Sister states patient is able to eat a wide variety of foods without difficulty. She is interested in patient seeing SLP again, so address and contact information provided Smoking or chewing tobacco? None Using fluoride trays daily? N/A Last ENT visit was on: Not since diagnosis, but did see his oral surgeon ([name]) at [facility] on 09/20/2020:  Plan:   Continue oral hygiene and routine dental care  Will follow-up restaging CT scheduled for 09/28/20 at [facility]. Will see radiation oncologist on 09/29/20.  Will trial Mucinex D for persistent post nasal drip and congestion  Follow up in 3 months for continued cancer surveillance at which time I will likely plan to perform a nasopharyngoscopy.  Other notable issues, if any: Patient sustained a fall last week due to dehydration. Stable now and sister has assumed full responsibility/management of patient's care. Patient denies any symptoms of dry mouth, taste changes, thick saliva, or lymphedema   Vitals:   09/29/20 1112  BP: (!) 90/54  Pulse: 97  Resp: 18  Temp: (!) 97 F (36.1 C)  SpO2: 100%

## 2020-09-29 NOTE — Progress Notes (Signed)
[facility] Work  Clinical Social Work was referred by [name] for assistance with resources for caregiver.  Clinical Social Worker contacted caregiver by phone (caregiver is sisterEstanislado Branch)  to offer support and assess for needs.  Per [name], she has been pt's guardian but also had to urgently take him into her home due to issues with last caregiving environment. She is being trained and on-boarded through [facility] but wanted to know if there are respite services available.  CSW recommended contacting pt's insurance providers to determine if case workers there have recommendations that would be covered by his insurance. Also provided information on [facility] and [facility] as well as general caregiver support groups through [employer] and [employer].  No other questions or needs at this time. Caregiver may contact this CSW directly with further resource needs.    [place], [place] Worker [employer]

## 2020-09-29 NOTE — Progress Notes (Signed)
Radiation Oncology         [phone] ________________________________  Name: [name] Branch MRN: [id number]  Date: 09/29/2020  DOB: 06-28-1966  Follow-Up Visit Note  CC: [name] Han, MD  Ladell Pier, MD  Diagnosis and Prior Radiotherapy:       ICD-10-CM   1. 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     Radiation Treatment Dates: 05/20/2020 through 06/16/2020 Site Technique Total Dose (Gy) Dose per Fx (Gy) Completed Fx Beam Energies  Neck: HN_palate_necknodes IMRT 51/51 2.55 20/20 6X    CHIEF COMPLAINT:  Here for follow-up and surveillance of palate cancer  Narrative:    [name] Branch presents today for follow-up after completing radiation to his palate on 06/16/2020, and to review CT scan results from 09/29/2020  Pain issues, if any: Patient denies (sister/guardian confirms that patient doesn't mention pain while at home) Using a feeding tube?: N/A Weight changes, if any: Patient's sister denies any issues with eating Wt Readings from Last 3 Encounters:  09/29/20 219 lb 6 oz (99.5 kg)  09/25/20 (!) 479 lb 1 oz (217.3 kg)  09/02/20 219 lb 6.4 oz (99.5 kg)   Swallowing issues, if any: Patient and sister deny. Sister states patient is able to eat a wide variety of foods without difficulty. She is interested in patient seeing SLP again, so address and contact information provided Smoking or chewing tobacco? None Using fluoride trays daily? N/A Last ENT visit was on: Not since diagnosis, but did see his oral surgeon ([name]) at [facility] on 09/20/2020:  Plan:   Continue oral hygiene and routine dental care  Will follow-up restaging CT scheduled for 09/28/20 at [facility]. Will see radiation oncologist on 09/29/20.  Will trial Mucinex D for persistent post nasal drip and congestion  Follow up in 3 months for continued cancer surveillance at which time I will likely plan to perform a nasopharyngoscopy.  Other notable issues, if any: Patient sustained a  fall last week due to dehydration -Per patient's sister, this is perceived to be due to suboptimal supervision and support from his prior caregiver. Stable now and sister has assumed full responsibility/management of patient's care. Patient denies any symptoms of dry mouth, taste changes, thick saliva, or lymphedema   His congestion and postnasal drip are improved on Sudafed and Mucinex.  Vitals:   09/29/20 1112  BP: (!) 90/54  Pulse: 97  Resp: 18  Temp: (!) 97 F (36.1 C)  SpO2: 100%     ALLERGIES:  is allergic to hydrocodone.  Meds: Current Outpatient Medications  Medication Sig Dispense Refill  . amoxicillin (AMOXIL) 500 MG capsule Take 500 mg by mouth 3 (three) times daily.    . carbamide peroxide (DEBROX) 6.5 % OTIC solution 5 drops 2 (two) times daily.    . cetirizine (ZYRTEC) 10 MG tablet Take 10 mg by mouth daily.    . cloZAPine (CLOZARIL) 100 MG tablet Take 250 mg by mouth at bedtime.    . dapagliflozin propanediol (FARXIGA) 10 MG TABS tablet Take 10 mg by mouth every morning.    Marland Kitchen Dextromethorphan-guaiFENesin (MUCINEX DM) 30-600 MG TB12 Take 1 tablet by mouth 2 (two) times daily.    . metoprolol succinate (TOPROL-XL) 25 MG 24 hr tablet Take 25 mg by mouth every evening.    Marland Kitchen MUCUS RELIEF DM COUGH 20-400 MG TABS Take 10 mg by mouth in the morning and at bedtime.    . Multiple Vitamin (MULTIVITAMIN) capsule Take 1 capsule by mouth daily.  90 capsule 3  . ondansetron (ZOFRAN-ODT) 4 MG disintegrating tablet Take by mouth.    . pravastatin (PRAVACHOL) 40 MG tablet TAKE ONE TABLET EACH DAY 90 tablet 3  . sacubitril-valsartan (ENTRESTO) 97-103 MG Take 1 tablet by mouth 2 (two) times daily. 180 tablet 0  . sodium fluoride (PREVIDENT 5000 PLUS) 1.1 % CREA dental cream Use a "grape-size" amount on a soft toothbrush every night.  Do not rinse with water or eat for at least 5 minutes after brushing teeth. 51 g 12   No current facility-administered medications for this encounter.     Physical Findings: The patient is in no acute distress. Patient is alert and oriented. Wt Readings from Last 3 Encounters:  09/29/20 219 lb 6 oz (99.5 kg)  09/25/20 (!) 479 lb 1 oz (217.3 kg)  09/02/20 219 lb 6.4 oz (99.5 kg)    height is 6\' 2"  (1.88 m) and weight is 219 lb 6 oz (99.5 kg). His temporal temperature is 97 F (36.1 C) (abnormal). His blood pressure is 90/54 (abnormal) and his pulse is 97. His respiration is 18 and oxygen saturation is 100%. .  General: Alert, in no acute distress HEENT: Mild exophthalmos and moderate erythema of the eyes bilaterally  Head is normocephalic Right oral cavity /oropharynx notable for no tumor. No thrush.  No trismus. Neck: Neck is notable for no palpable masses. Skin: Skin in treatment fields shows satisfactory healing    MSK: Ambulatory independently Heart: Regular rate and rhythm with no murmurs Chest clear to auscultation bilaterally   Lab Findings: Lab Results  Component Value Date   WBC 4.5 09/25/2020   HGB 13.8 09/25/2020   HCT 43.3 09/25/2020   MCV 95.8 09/25/2020   PLT 145 (L) 09/25/2020    Lab Results  Component Value Date   TSH 1.670 12/31/2017   CMP     Component Value Date/Time   NA 136 09/25/2020 1746   NA 138 09/13/2020 1524   K 4.1 09/25/2020 1746   CL 97 (L) 09/25/2020 1746   CO2 30 09/25/2020 1746   GLUCOSE 90 09/25/2020 1746   BUN 16 09/25/2020 1746   BUN 17 09/13/2020 1524   CREATININE 1.24 09/25/2020 1746   CREATININE 1.01 09/02/2012 1022   CALCIUM 9.5 09/25/2020 1746   PROT 7.8 09/25/2020 1746   PROT 6.8 11/23/2017 1108   ALBUMIN 4.2 09/25/2020 1746   ALBUMIN 4.3 11/23/2017 1108   AST 42 (H) 09/25/2020 1746   ALT 63 (H) 09/25/2020 1746   ALKPHOS 109 09/25/2020 1746   BILITOT 0.7 09/25/2020 1746   BILITOT 0.6 11/23/2017 1108   GFRNONAA >60 09/25/2020 1746   GFRNONAA 89 09/02/2012 1022   GFRAA 78 09/13/2020 1524   GFRAA >89 09/02/2012 1022    Radiographic Findings: DG Chest 2  View  Result Date: 09/25/2020 CLINICAL DATA:  Status post fall. History of congestive heart failure. EXAM: CHEST - 2 VIEW COMPARISON:  CT chest 03/05/2020, chest x-ray 07/28/2020 FINDINGS: The heart size and mediastinal contours are unchanged. Right base linear atelectasis versus scarring. No focal consolidation. No pulmonary edema. No pleural effusion. No pneumothorax. No acute osseous abnormality. IMPRESSION: No active cardiopulmonary disease. Electronically Signed   By: Iven Finn M.D.   On: 09/25/2020 17:11   CT Head Wo Contrast  Result Date: 09/25/2020 CLINICAL DATA:  Fall. EXAM: CT HEAD WITHOUT CONTRAST CT CERVICAL SPINE WITHOUT CONTRAST TECHNIQUE: Multidetector CT imaging of the head and cervical spine was performed following the standard protocol without  intravenous contrast. Multiplanar CT image reconstructions of the cervical spine were also generated. COMPARISON:  December 30, 2017. FINDINGS: CT HEAD FINDINGS Brain: Mild chronic ischemic white matter disease is noted. No mass effect or midline shift is noted. Ventricular size is within normal limits. There is no evidence of mass lesion, hemorrhage or acute infarction. Vascular: No hyperdense vessel or unexpected calcification. Skull: Normal. Negative for fracture or focal lesion. Sinuses/Orbits: Right maxillary and ethmoid sinusitis is noted. Other: None. CT CERVICAL SPINE FINDINGS Alignment: Mild reversal of normal lordosis is noted which most likely is positional in origin. No spondylolisthesis is noted. Skull base and vertebrae: No acute fracture. No primary bone lesion or focal pathologic process. Soft tissues and spinal canal: No prevertebral fluid or swelling. No visible canal hematoma. Disc levels: Mild to moderate degenerative disc disease is noted at C2-3, C3-4, C4-5, C5-6 and C6-7 with anterior and posterior osteophyte formation. Upper chest: Negative. Other: None. IMPRESSION: 1. Mild chronic ischemic white matter disease. Right maxillary  and ethmoid sinusitis. No acute intracranial abnormality seen. 2. Multilevel degenerative disc disease. No acute abnormality seen in the cervical spine. Electronically Signed   By: Marijo Conception M.D.   On: 09/25/2020 17:03   CT Soft Tissue Neck W Contrast  Result Date: 09/29/2020 CLINICAL DATA:  Minor salivary gland cancer of palate, completed radiation in November EXAM: CT NECK WITH CONTRAST TECHNIQUE: Multidetector CT imaging of the neck was performed using the standard protocol following the bolus administration of intravenous contrast. CONTRAST:  176mL OMNIPAQUE IOHEXOL 300 MG/ML  SOLN COMPARISON:  Outside study 03/05/2020 FINDINGS: Pharynx and larynx: Interval postoperative changes at the right aspect of the anterior right soft palate with irregularity of the underlying maxilla adjacent to the greater palatine foramen and dehiscence of the adjacent maxillary sinus wall. Recurrent soft tissue lesion is not identified within limitation of streak artifact. No significant abnormality of the pharynx and larynx. Salivary glands: Unremarkable. Thyroid: Unremarkable. Lymph nodes: No enlarged or abnormal density nodes. Vascular: Major neck vessels are patent. Limited intracranial: No abnormal enhancement. Visualized orbits: Unremarkable. Mastoids and visualized paranasal sinuses: New near total opacification of the right maxillary sinus and moderate opacification of the right anterior ethmoids and hypoplastic right frontal sinus with occluded ostiomeatal unit. Mastoid air cells are clear. Skeleton: Atlantooccipital assimilation. Degenerative changes of the cervical spine. Ossification of the posterior longitudinal ligament. Appearance is similar to prior study. Upper chest: Dictated separately. Other: None. IMPRESSION: Baseline postoperative study. No evidence of local recurrence or new adenopathy. Right paranasal sinus inflammatory changes as described. Acute sinusitis is possible in the appropriate clinical  setting. There is dehiscence of the right maxillary sinus wall at the operative site. Electronically Signed   By: Macy Mis M.D.   On: 09/29/2020 10:23   CT Chest W Contrast  Result Date: 09/29/2020 CLINICAL DATA:  Head and neck cancer post radiation therapy completed 4 months ago. Surveillance. EXAM: CT CHEST WITH CONTRAST TECHNIQUE: Multidetector CT imaging of the chest was performed during intravenous contrast administration. CONTRAST:  159mL OMNIPAQUE IOHEXOL 300 MG/ML  SOLN COMPARISON:  CT chest 03/05/2020 [facility]. Chest CT 08/03/2014. FINDINGS: Cardiovascular: There are no significant vascular findings. The heart size is normal. There is no pericardial effusion. Mediastinum/Nodes: There are no enlarged mediastinal, hilar or axillary lymph nodes. The thyroid gland, trachea and esophagus demonstrate no significant findings. Lungs/Pleura: No pleural effusion. Mild centrilobular emphysema and scattered pulmonary parenchymal scarring. A 4 mm right middle lobe nodule on image 97/8  is stable from 08/03/2014, consistent with a benign finding. Stable chronic bulla posteriorly at the right lung base. Upper abdomen: The visualized upper abdomen appears stable, without acute findings. There is a nonobstructing calculus in the upper pole of the left kidney. Musculoskeletal/Chest wall: There is no chest wall mass or suspicious osseous finding. IMPRESSION: 1. Stable chest CT. No evidence of metastatic disease. 2. Stable small right middle lobe pulmonary nodule from 2016, consistent with a benign finding. Electronically Signed   By: Richardean Sale M.D.   On: 09/29/2020 13:15   CT Cervical Spine Wo Contrast  Result Date: 09/25/2020 CLINICAL DATA:  Fall. EXAM: CT HEAD WITHOUT CONTRAST CT CERVICAL SPINE WITHOUT CONTRAST TECHNIQUE: Multidetector CT imaging of the head and cervical spine was performed following the standard protocol without intravenous contrast. Multiplanar CT image  reconstructions of the cervical spine were also generated. COMPARISON:  December 30, 2017. FINDINGS: CT HEAD FINDINGS Brain: Mild chronic ischemic white matter disease is noted. No mass effect or midline shift is noted. Ventricular size is within normal limits. There is no evidence of mass lesion, hemorrhage or acute infarction. Vascular: No hyperdense vessel or unexpected calcification. Skull: Normal. Negative for fracture or focal lesion. Sinuses/Orbits: Right maxillary and ethmoid sinusitis is noted. Other: None. CT CERVICAL SPINE FINDINGS Alignment: Mild reversal of normal lordosis is noted which most likely is positional in origin. No spondylolisthesis is noted. Skull base and vertebrae: No acute fracture. No primary bone lesion or focal pathologic process. Soft tissues and spinal canal: No prevertebral fluid or swelling. No visible canal hematoma. Disc levels: Mild to moderate degenerative disc disease is noted at C2-3, C3-4, C4-5, C5-6 and C6-7 with anterior and posterior osteophyte formation. Upper chest: Negative. Other: None. IMPRESSION: 1. Mild chronic ischemic white matter disease. Right maxillary and ethmoid sinusitis. No acute intracranial abnormality seen. 2. Multilevel degenerative disc disease. No acute abnormality seen in the cervical spine. Electronically Signed   By: Marijo Conception M.D.   On: 09/25/2020 17:03    Impression/Plan:   NED per physical exam and CT imaging  1) I sent an email to the patient's surgeon, [name], about the CT images.  Patient's postnasal drip and congestion are better with over-the-counter medication.  CT demonstrated possible acute sinusitis, presumed to be due to compromise of the maxillary wall postoperatively.  We will get his images power shared to [facility]  2) The patient's sister and I talked about her caregiving responsibilities.  I offered a referral to social work to see if there is additional support that can be made available to her.  She is enthusiastic  about this referral.  3) We will set up a follow-up in our clinic in 6 months.  We will not order any imaging unless otolaryngology requested to be done here.  The patient and his sister are pleased with this plan.  I look forward to seeing him back in September   On date of service, in total, I spent 30 minutes on this encounter. Patient was seen in person. _____________________________________   Eppie Gibson, MD

## 2020-09-30 ENCOUNTER — Encounter: Payer: Self-pay | Admitting: Cardiology

## 2020-09-30 ENCOUNTER — Ambulatory Visit: Payer: Medicare Other | Admitting: Cardiology

## 2020-09-30 VITALS — BP 98/60 | HR 53 | Temp 97.6°F | Resp 16 | Ht 74.0 in | Wt 215.0 lb

## 2020-09-30 DIAGNOSIS — I429 Cardiomyopathy, unspecified: Secondary | ICD-10-CM

## 2020-09-30 DIAGNOSIS — I4510 Unspecified right bundle-branch block: Secondary | ICD-10-CM

## 2020-09-30 DIAGNOSIS — E782 Mixed hyperlipidemia: Secondary | ICD-10-CM

## 2020-09-30 DIAGNOSIS — I5042 Chronic combined systolic (congestive) and diastolic (congestive) heart failure: Secondary | ICD-10-CM

## 2020-09-30 NOTE — Progress Notes (Signed)
ID:  [name] Branch, DOB 1966-07-28, MRN [id number]  PCP:  [name], MD  Cardiologist:  Rex Kras, DO, [facility] (established care 07/12/2020) Former Cardiology Providers: Dr. Cloretta Ned and Vilinda Flake PA.  Date: 09/30/20 Last Office Visit: 09/02/2020  Chief Complaint  Patient presents with  . Chronic combined systolic and diastolic CHF  . Hospitalization Follow-up    HPI  [name] Branch is a 55 y.o. 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  He is referred to the office at the request of [name]* for evaluation of cardiomyopathy and chronic congestive heart.  Given his intellectual disability and schizophrenia his is accompanied by his guardian [name] (sister / guardian) [phone]. Patient and guardian provided verbal consent in regards to discussing his care in the presence of the patient's caregiver.  He was seeing Dr. Edwin Dada at [facility] and re-established care with our practice back in December 2021 for management of cardiomyopathy presumed to be nonischemic and chronic combined systolic and diastolic heart failure.  Based on EMR patient is noted to have cardiomyopathy since 2016 at the time of colon surgery.  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 Patient's sister states that he subsequently underwent  surgery and completed radiation treatment as of 06/16/2020.  His GDMT has been uptitrated slowly over the last several office visits.  He is maxed out on [place].  At the last office visit we did start spironolactone 12.5 mg p.o. daily.  He tolerated the medication well without any significant side effects or intolerances.  And repeat blood work noted stable kidney function and electrolytes.  However, since last office visit he went to the hospital on 09/25/2020 status post fall.  Underlying cause was attributed to dehydration.  They recommended to stop the spironolactone for now until he follows up in the office.  Patient's blood pressure today remains soft and his orthostatic vital signs are negative.  However he is asymptomatic with regards to lightheadedness, dizziness, near syncope or syncopal events.    FUNCTIONAL STATUS: Able to ambulate 24minutes five day a week, depending on the weather.   ALLERGIES: Allergies  Allergen Reactions  . Hydrocodone Other (See Comments)    Exacerbation of urinary and fecal incontinence    MEDICATION LIST PRIOR TO VISIT: Current Meds  Medication Sig  . carbamide peroxide (DEBROX) 6.5 % OTIC solution 5 drops 2 (two) times daily.  . cetirizine (ZYRTEC) 10 MG tablet Take 10 mg by mouth daily.  . cloZAPine (CLOZARIL) 100 MG tablet Take 250 mg by mouth at bedtime.  . dapagliflozin propanediol (FARXIGA) 10 MG TABS tablet Take 10 mg by mouth every morning.  Marland Kitchen Dextromethorphan-guaiFENesin (MUCINEX DM) 30-600 MG TB12 Take 1 tablet by mouth 2 (two) times daily.  . metoprolol succinate (TOPROL-XL) 25 MG 24 hr tablet Take 25 mg by mouth every evening.  Marland Kitchen MUCUS RELIEF DM COUGH 20-400 MG TABS Take 10 mg by mouth in the morning and at bedtime.  . Multiple Vitamin (MULTIVITAMIN) capsule Take  1 capsule by mouth daily.  . ondansetron (ZOFRAN-ODT) 4 MG disintegrating tablet Take by mouth.  . pravastatin (PRAVACHOL) 40 MG tablet TAKE ONE TABLET EACH DAY  .  sacubitril-valsartan (ENTRESTO) 97-103 MG Take 1 tablet by mouth 2 (two) times daily.  . sodium fluoride (PREVIDENT 5000 PLUS) 1.1 % CREA dental cream Use a "grape-size" amount on a soft toothbrush every night.  Do not rinse with water or eat for at least 5 minutes after brushing teeth.     PAST MEDICAL HISTORY: Past Medical History:  Diagnosis Date  . ABSCESS, ANAL/RECTAL REGIONS 08/18/2006   Annotation: Fournier gangrene Qualifier: History of  By: Garnette Scheuermann MD, Arlee Muslim    . Cardiomyopathy   . CHF (congestive heart failure) ([place])   . Colonic inertia   . Constipation   . Glaucoma   . Hyperlipidemia   . Hypovolemic shock ([place])   . Incontinent of feces   . Mental retardation   . 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   Annotation: chronic with stercoral ulcer  Qualifier: Diagnosis of  By: Garnette Scheuermann MD, Arlee Muslim    . Obstruction of colon (HCC)    recurrent  . Sarcoidosis   . Schizophrenia ([place])   . 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 Total self-care deficit    per caregiver he needs to have help bathing and cleaning self and needs help with all daily needs now     PAST SURGICAL HISTORY: Past Surgical History:  Procedure Laterality Date  . COLECTOMY N/A 09/25/2014   Procedure: OPEN ABDOMINAL COLECTOMY;  Surgeon: Michael Boston, MD;  Location: [facility];  Service: General;  Laterality: N/A;  . COLONOSCOPY  2007  . groin surgery     boil drained  . MOUTH SURGERY    . PROCTOSCOPY N/A 09/25/2014   Procedure: RIGID PROCTOSCOPY;  Surgeon: Michael Boston, MD;  Location: [facility];  Service: General;  Laterality: N/A;    FAMILY HISTORY: The patient family history includes Diabetes in his mother.  SOCIAL HISTORY:  The patient  reports that he has never smoked. He has never used smokeless tobacco. He reports that he does not drink alcohol and does not use drugs.  REVIEW OF SYSTEMS: Review of Systems  Constitutional: Negative for chills and fever.  HENT: Negative for hoarse voice and nosebleeds.   Eyes: Negative for  discharge, double vision and pain.  Cardiovascular: Negative for chest pain, claudication, dyspnea on exertion, leg swelling, near-syncope, orthopnea, palpitations, paroxysmal nocturnal dyspnea and syncope.  Respiratory: Negative for hemoptysis and shortness of breath.   Musculoskeletal: Negative for muscle cramps and myalgias.  Gastrointestinal: Negative for abdominal pain, constipation, diarrhea, hematemesis, hematochezia, melena, nausea and vomiting.  Neurological: Negative for dizziness and light-headedness.   PHYSICAL EXAM: Vitals with BMI 09/30/2020 09/29/2020 09/25/2020  Height 6\' 2"  6\' 2"  -  Weight 215 lbs 219 lbs 6 oz -  BMI 19.50 93.26 -  Systolic 98 90 712  Diastolic 60 54 76  Pulse 53 97 77   Orthostatic VS for the past 72 hrs (Last 3 readings):  Orthostatic BP Patient Position BP Location Cuff Size Orthostatic Pulse  09/30/20 1148 (!) 84/44 Standing Left Arm Large 93  09/30/20 1145 (!) 73/53 Sitting Left Arm Large 90  09/30/20 1143 (!) 82/50 Supine Left Arm Large 87   CONSTITUTIONAL: Appears older than stated age, hemodynamically stable, no acute distress.  SKIN: Skin is warm and dry. No rash noted. No cyanosis. No pallor. No jaundice HEAD: Normocephalic and atraumatic.  EYES: No scleral icterus  MOUTH/THROAT: Moist oral membranes.  NECK: No JVD present. No thyromegaly noted. No carotid bruits  LYMPHATIC: No visible cervical adenopathy.  CHEST Normal respiratory effort. No intercostal retractions  LUNGS: Clear to auscultation bilaterally. No stridor. No wheezes. No rales.  CARDIOVASCULAR: Regular rate and rhythm, positive S1-S2, no murmurs rubs or gallops appreciated. ABDOMINAL: No apparent ascites.  EXTREMITIES: No peripheral edema, 2+ dorsalis pedis and posterior tibial pulses. HEMATOLOGIC: No significant bruising NEUROLOGIC: Oriented to person, place, and time. Nonfocal. Normal muscle tone.  PSYCHIATRIC: Normal mood and affect. Normal behavior. Cooperative  CARDIAC  DATABASE: EKG: 09/30/2020: Normal sinus rhythm, 88 bpm, left axis deviation, right bundle branch block, without underlying injury pattern.    Echocardiogram: Echo 03/10/2020 and [facility] MODERATE LV DYSFUNCTION EF 35%, LV normal size NORMAL RIGHT VENTRICULAR SYSTOLIC FUNCTION VALVULAR REGURGITATION: MILD AR, TRIVIAL MR, TRIVIAL PR, TRIVIAL TR NO VALVULAR STENOSIS  Stress Testing: No results found for this or any previous visit from the past 1095 days.  Heart Catheterization: None  LABORATORY DATA: CBC Latest Ref Rng & Units 09/25/2020 07/28/2020 02/19/2018  WBC 4.0 - 10.5 K/uL 4.5 4.1 4.8  Hemoglobin 13.0 - 17.0 g/dL 13.8 14.7 13.9  Hematocrit 39.0 - 52.0 % 43.3 48.7 42.7  Platelets 150 - 400 K/uL 145(L) 161 163.0    CMP Latest Ref Rng & Units 09/25/2020 09/13/2020 08/17/2020  Glucose 70 - 99 mg/dL 90 90 127(H)  BUN 6 - 20 mg/dL 16 17 16   Creatinine 0.61 - 1.24 mg/dL 1.24 1.21 1.22  Sodium 135 - 145 mmol/L 136 138 139  Potassium 3.5 - 5.1 mmol/L 4.1 4.7 4.8  Chloride 98 - 111 mmol/L 97(L) 103 100  CO2 22 - 32 mmol/L 30 20 26   Calcium 8.9 - 10.3 mg/dL 9.5 9.3 9.6  Total Protein 6.5 - 8.1 g/dL 7.8 - -  Total Bilirubin 0.3 - 1.2 mg/dL 0.7 - -  Alkaline Phos 38 - 126 U/L 109 - -  AST 15 - 41 U/L 42(H) - -  ALT 0 - 44 U/L 63(H) - -    Lipid Panel     Component Value Date/Time   CHOL 133 03/25/2018 1155   TRIG 64 03/25/2018 1155   HDL 59 03/25/2018 1155   CHOLHDL 2.3 03/25/2018 1155   CHOLHDL 3.4 02/25/2014 1034   VLDL 11 02/25/2014 1034   LDLCALC 61 03/25/2018 1155   LABVLDL 13 03/25/2018 1155    No components found for: NTPROBNP Recent Labs    07/14/20 1559 08/17/20 1230 09/13/20 1524  PROBNP 102 61 30   No results for input(s): TSH in the last 8760 hours.  BMP Recent Labs    07/14/20 1559 07/28/20 2234 08/17/20 1230 09/13/20 1524 09/25/20 1746  NA 140   < > 139 138 136  K 4.4   < > 4.8 4.7 4.1  CL 103   < > 100 103 97*  CO2 26   < > 26 20  30   GLUCOSE 87   < > 127* 90 90  BUN 13   < > 16 17 16   CREATININE 1.11   < > 1.22 1.21 1.24  CALCIUM 9.3   < > 9.6 9.3 9.5  GFRNONAA 75   < > 67 67 >60  GFRAA 87  --  78 78  --    < > = values in this interval not displayed.    HEMOGLOBIN A1C Lab Results  Component Value Date   HGBA1C 6.0 (H) 09/18/2014   MPG  126 09/18/2014    IMPRESSION:    ICD-10-CM   1. Chronic combined systolic and diastolic CHF, NYHA class 2 (HCC)  I50.42 EKG 12-Lead    PCV ECHOCARDIOGRAM COMPLETE  2. Cardiomyopathy, unspecified type ([place])  I42.9   3. Mixed hyperlipidemia  E78.2   4. RBBB  I45.10      RECOMMENDATIONS: [name] Branch is a 55 y.o. 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  Chronic combined systolic and diastolic heart failure, NYHA class II, stage B: Heart failure medications reconciled. Independently reviewed labs from September 25, 2020 with the patient and his sister.   Discontinue Aldactone 12.5 mg p.o. every morning. Patient's blood pressure is also soft at today's office visit.  Orthostatic vital signs negative. I have advised the patient is sister to hold off Entresto for 1 week to help improve his systolic blood pressure. He may restart Entresto next week as long as his systolic blood pressures are greater than 110 mmHg. Patient is educated on the importance of keeping himself well-hydrated and eating 3 balanced heart healthy meals. Of note, patient is no longer under the care of Ms. Shea Evans and his sister who also has guardianship is in the  process of learning to take care of him.  To help facilitate this transition I will see the patient back in 2 weeks. We will plan for a follow-up echocardiogram in May 2022.  To reevaluate LVEF  Cardiomyopathy, presumed NICMP: See above.   Hyperlipidemia, mixed: Continue statin.  Currently managed by primary care provider.  RBBB: Continue monitor.   Continue follow-up with PCP regarding the management of his other chronic comorbid conditions.  Plan of care discussed with both the patient, and his guardian.  FINAL MEDICATION LIST END OF ENCOUNTER:   Current Outpatient Medications:  .  carbamide peroxide (DEBROX) 6.5 % OTIC solution, 5 drops 2 (two) times daily., Disp: , Rfl:  .  cetirizine (ZYRTEC) 10 MG tablet, Take 10 mg by mouth daily., Disp: , Rfl:  .  cloZAPine (CLOZARIL) 100 MG tablet, Take 250 mg by mouth at bedtime., Disp: , Rfl:  .  dapagliflozin propanediol (FARXIGA) 10 MG TABS tablet, Take 10 mg by mouth every morning., Disp: , Rfl:  .  Dextromethorphan-guaiFENesin (MUCINEX DM) 30-600 MG TB12, Take 1 tablet by mouth 2 (two) times daily., Disp: , Rfl:  .  metoprolol succinate (TOPROL-XL) 25 MG 24 hr tablet, Take 25 mg by mouth every evening., Disp: , Rfl:  .  MUCUS RELIEF DM COUGH 20-400 MG TABS, Take 10 mg by mouth in the morning and at bedtime., Disp: , Rfl:  .  Multiple Vitamin (MULTIVITAMIN) capsule, Take 1 capsule by mouth daily., Disp: 90 capsule, Rfl: 3 .  ondansetron (ZOFRAN-ODT) 4 MG disintegrating tablet, Take by mouth., Disp: , Rfl:  .  pravastatin (PRAVACHOL) 40 MG tablet, TAKE ONE TABLET EACH DAY, Disp: 90 tablet, Rfl: 3 .  sacubitril-valsartan (ENTRESTO) 97-103 MG, Take 1 tablet by mouth 2 (two) times daily., Disp: 180 tablet, Rfl: 0 .  sodium fluoride (PREVIDENT 5000 PLUS) 1.1 % CREA dental cream, Use a "grape-size" amount on a soft toothbrush every night.  Do not rinse with water  or eat for at least 5 minutes after brushing teeth., Disp: 51 g, Rfl: 12  Orders  Placed This Encounter  Procedures  . EKG 12-Lead  . PCV ECHOCARDIOGRAM COMPLETE   There are no Patient Instructions on file for this visit.   --Continue cardiac medications as reconciled in final medication list. --Return in about 2 weeks (around 10/14/2020) for Follow up, heart failure management.. Or sooner if needed. --Continue follow-up with your primary care physician regarding the management of your other chronic comorbid conditions.  Patient's questions and concerns were addressed to his satisfaction. He voices understanding of the instructions provided during this encounter.   This note was created using a voice recognition software as a result there may be grammatical errors inadvertently enclosed that do not reflect the nature of this encounter. Every attempt is made to correct such errors.  Rex Kras, [place], [facility]  Pager: [phone] Office: [phone]

## 2020-10-05 ENCOUNTER — Telehealth: Payer: Self-pay | Admitting: *Deleted

## 2020-10-05 NOTE — Telephone Encounter (Signed)
CALLED PATIENT TO INFORM OF FU WITH [name] ON 04-01-21 @ 11 AM, SPOKE WITH PATIENT'S DAUGHTER- LORI AND SHE IS AWARE OF THIS APPT.

## 2020-10-13 ENCOUNTER — Telehealth: Payer: Self-pay

## 2020-10-13 NOTE — Telephone Encounter (Signed)
done

## 2020-10-18 ENCOUNTER — Other Ambulatory Visit: Payer: Self-pay

## 2020-10-18 ENCOUNTER — Ambulatory Visit: Payer: Medicare Other | Admitting: Podiatry

## 2020-10-18 ENCOUNTER — Encounter: Payer: Self-pay | Admitting: Podiatry

## 2020-10-18 ENCOUNTER — Ambulatory Visit (INDEPENDENT_AMBULATORY_CARE_PROVIDER_SITE_OTHER): Payer: Medicare Other | Admitting: Podiatry

## 2020-10-18 ENCOUNTER — Other Ambulatory Visit: Payer: Self-pay | Admitting: Cardiology

## 2020-10-18 DIAGNOSIS — M79675 Pain in left toe(s): Secondary | ICD-10-CM

## 2020-10-18 DIAGNOSIS — B351 Tinea unguium: Secondary | ICD-10-CM

## 2020-10-18 DIAGNOSIS — L84 Corns and callosities: Secondary | ICD-10-CM

## 2020-10-18 DIAGNOSIS — M79674 Pain in right toe(s): Secondary | ICD-10-CM

## 2020-10-18 DIAGNOSIS — I5042 Chronic combined systolic (congestive) and diastolic (congestive) heart failure: Secondary | ICD-10-CM

## 2020-10-19 NOTE — Progress Notes (Signed)
ID:  [name] Branch, DOB [birth year], MRN [id number]  PCP:  [name] Han, MD  Cardiologist: [name] Kras, DO, [facility] (established care 07/12/2020) Former Cardiology Providers: Dr. Cloretta Branch and [name] Flake PA.  Date: 10/20/20 Last Office Visit: 09/30/2020  Chief Complaint  Patient presents with  . Chronic systolic heart failure   . Dizziness    HPI  [name] Branch is a 55 y.o. 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  He is referred to the office at the request of [name] Branch* for evaluation of cardiomyopathy and chronic congestive heart.  Given his intellectual disability and schizophrenia his is accompanied by his guardian [name] Branch (sister / guardian) [phone].   He was formally under the care of Dr. Edwin Branch at [facility] and re-established care with our practice in December 2021 for management of cardiomyopathy presumed to be nonischemic and chronic combined systolic and diastolic heart failure.  Based on EMR patient is noted to have cardiomyopathy since 2016 at the time of colon surgery.  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 Patient's sister states that he subsequently underwent surgery and completed radiation treatment as of 06/16/2020.  His GDMT has been uptitrated slowly over the last several office visits.  And the  plan was to perform an echocardiogram in 90 days to reevaluate LVEF.  However, in the recent past patient has been hospitalized at [facility] due to lightheaded, dizziness, and was noted to be dehydrated.  Patient was last seen in the office 2 weeks ago at that time recommended discontinuation of spironolactone and holding Entresto for 1 week to restabilize his blood pressures.  However, patient is Sister [name] Branch forgot to hold the [place].  Patient remains to have low blood pressures and at times lightheaded and dizzy.  However, recently patient's sister notes that his morning blood pressures are elevated.  The highest a.m. blood pressure she is noted is 208/130 but is usually around 146/96.  As the day progresses patient states that his blood pressure improves within acceptable range.  He has a hx of sleep apnea but after losing his weight no longer using his CPAP. He denies any near-syncope or syncopal events.  He also denies any focal neurological deficits.  FUNCTIONAL STATUS: Able to ambulate 23minutes five day a week, depending on the weather.   ALLERGIES: Allergies  Allergen Reactions  . Hydrocodone Other (See Comments)    Exacerbation of urinary and fecal incontinence    MEDICATION LIST PRIOR TO VISIT: Current Meds  Medication Sig  . carbamide peroxide (DEBROX) 6.5 % OTIC solution 5 drops 2 (two) times daily.  . cetirizine (ZYRTEC) 10 MG tablet Take 10 mg by mouth daily.  . cloZAPine (CLOZARIL) 100 MG tablet Take 250 mg by mouth at bedtime.  . dapagliflozin propanediol (FARXIGA) 10 MG TABS tablet Take 10 mg by mouth every morning.  Marland Kitchen Dextromethorphan-guaiFENesin (MUCINEX DM) 30-600 MG TB12 Take 1 tablet by mouth 2 (two) times daily.  . metoprolol succinate (TOPROL-XL) 25 MG 24 hr tablet Take  25 mg by mouth every evening.  Marland Kitchen MUCUS RELIEF DM COUGH 20-400 MG TABS Take 10 mg by mouth in the morning and at bedtime.  . Multiple Vitamin (MULTIVITAMIN) capsule Take 1 capsule by mouth daily.   . pravastatin (PRAVACHOL) 40 MG tablet TAKE ONE TABLET EACH DAY  . sacubitril-valsartan (ENTRESTO) 97-103 MG Take 1 tablet by mouth 2 (two) times daily.  . sodium fluoride (PREVIDENT 5000 PLUS) 1.1 % CREA dental cream Use a "grape-size" amount on a soft toothbrush every night.  Do not rinse with water or eat for at least 5 minutes after brushing teeth.     PAST MEDICAL HISTORY: Past Medical History:  Diagnosis Date  . ABSCESS, ANAL/RECTAL REGIONS 08/18/2006   Annotation: Fournier gangrene Qualifier: History of  By: Garnette Scheuermann MD, Arlee Muslim    . Cardiomyopathy   . CHF (congestive heart failure) ([place])   . Colonic inertia   . Constipation   . Glaucoma   . Hyperlipidemia   . Hypovolemic shock ([name])   . Incontinent of feces   . Mental retardation   . 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   Annotation: chronic with stercoral ulcer  Qualifier: Diagnosis of  By: Garnette Scheuermann MD, Arlee Muslim    . Obstruction of colon (HCC)    recurrent  . Sarcoidosis   . Schizophrenia ([place])   . 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 Total self-care deficit    per caregiver he needs to have help bathing and cleaning self and needs help with all daily needs now     PAST SURGICAL HISTORY: Past Surgical History:  Procedure Laterality Date  . COLECTOMY N/A 09/25/2014   Procedure: OPEN ABDOMINAL COLECTOMY;  Surgeon: Michael Boston, MD;  Location: [facility]S;  Service: General;  Laterality: N/A;  . COLONOSCOPY  2007  . groin surgery     boil drained  . MOUTH SURGERY    . PROCTOSCOPY N/A 09/25/2014   Procedure: RIGID PROCTOSCOPY;  Surgeon: Michael Boston, MD;  Location: [facility];  Service: General;  Laterality: N/A;    FAMILY HISTORY: The patient family history includes Diabetes in his mother.  SOCIAL HISTORY:  The patient  reports that he has never smoked. He has never used smokeless tobacco. He reports that he does not drink alcohol and does not use drugs.  REVIEW OF SYSTEMS: Review of Systems  Constitutional: Negative for chills and fever.   HENT: Negative for hoarse voice and nosebleeds.   Eyes: Negative for discharge, double vision and pain.  Cardiovascular: Negative for chest pain, claudication, dyspnea on exertion, leg swelling, near-syncope, orthopnea, palpitations, paroxysmal nocturnal dyspnea and syncope.  Respiratory: Negative for hemoptysis and shortness of breath.   Musculoskeletal: Negative for muscle cramps and myalgias.  Gastrointestinal: Negative for abdominal pain, constipation, diarrhea, hematemesis, hematochezia, melena, nausea and vomiting.  Neurological: Positive for dizziness. Negative for light-headedness.   PHYSICAL EXAM: Vitals with BMI 10/20/2020 10/20/2020 09/30/2020  Height 6\' 2"  6\' 2"  6\' 2"   Weight 232 lbs 232 lbs 215 lbs  BMI 29.77 93.81 82.99  Systolic - 371 98  Diastolic - 70 60  Pulse - 109 53   Orthostatic VS for the past 72 hrs (Last 3 readings):  Orthostatic BP Patient Position BP Location Cuff Size Orthostatic Pulse  10/20/20 1042 100/60 Standing Left Arm Large 111  10/20/20 1041 94/62 Sitting Left Arm Large 106  10/20/20 1040 104/54 Supine Left Arm Large 105   CONSTITUTIONAL: Appears older than stated age, hemodynamically stable, no acute distress.  SKIN: Skin is warm  and dry. No rash noted. No cyanosis. No pallor. No jaundice HEAD: Normocephalic and atraumatic.  EYES: No scleral icterus MOUTH/THROAT: Moist oral membranes.  NECK: No JVD present. No thyromegaly noted. No carotid bruits  LYMPHATIC: No visible cervical adenopathy.  CHEST Normal respiratory effort. No intercostal retractions  LUNGS: Clear to auscultation bilaterally. No stridor. No wheezes. No rales.  CARDIOVASCULAR: Regular rate and rhythm, positive S1-S2, no murmurs rubs or gallops appreciated. ABDOMINAL: No apparent ascites.  EXTREMITIES: No peripheral edema, 2+ dorsalis pedis and posterior tibial pulses. HEMATOLOGIC: No significant bruising NEUROLOGIC: Oriented to person, place, and time. Nonfocal. Normal muscle tone.   PSYCHIATRIC: Normal mood and affect. Normal behavior. Cooperative  CARDIAC DATABASE: EKG: 09/30/2020: Normal sinus rhythm, 88 bpm, left axis deviation, right bundle branch block, without underlying injury pattern.    Echocardiogram: Echo 03/10/2020 and [facility] MODERATE LV DYSFUNCTION EF 35%, LV normal size NORMAL RIGHT VENTRICULAR SYSTOLIC FUNCTION VALVULAR REGURGITATION: MILD AR, TRIVIAL MR, TRIVIAL PR, TRIVIAL TR NO VALVULAR STENOSIS  Stress Testing: No results found for this or any previous visit from the past 1095 days.  Heart Catheterization: None  LABORATORY DATA: CBC Latest Ref Rng & Units 09/25/2020 07/28/2020 02/19/2018  WBC 4.0 - 10.5 K/uL 4.5 4.1 4.8  Hemoglobin 13.0 - 17.0 g/dL 13.8 14.7 13.9  Hematocrit 39.0 - 52.0 % 43.3 48.7 42.7  Platelets 150 - 400 K/uL 145(L) 161 163.0    CMP Latest Ref Rng & Units 10/19/2020 09/25/2020 09/13/2020  Glucose 65 - 99 mg/dL 97 90 90  BUN 6 - 24 mg/dL 10 16 17   Creatinine 0.76 - 1.27 mg/dL 1.06 1.24 1.21  Sodium 134 - 144 mmol/L 143 136 138  Potassium 3.5 - 5.2 mmol/L 4.5 4.1 4.7  Chloride 96 - 106 mmol/L 102 97(L) 103  CO2 20 - 29 mmol/L 24 30 20   Calcium 8.7 - 10.2 mg/dL 9.1 9.5 9.3  Total Protein 6.5 - 8.1 g/dL - 7.8 -  Total Bilirubin 0.3 - 1.2 mg/dL - 0.7 -  Alkaline Phos 38 - 126 U/L - 109 -  AST 15 - 41 U/L - 42(H) -  ALT 0 - 44 U/L - 63(H) -    Lipid Panel     Component Value Date/Time   CHOL 133 03/25/2018 1155   TRIG 64 03/25/2018 1155   HDL 59 03/25/2018 1155   CHOLHDL 2.3 03/25/2018 1155   CHOLHDL 3.4 02/25/2014 1034   VLDL 11 02/25/2014 1034   LDLCALC 61 03/25/2018 1155   LABVLDL 13 03/25/2018 1155    No components found for: NTPROBNP Recent Labs    07/14/20 1559 08/17/20 1230 09/13/20 1524 10/19/20 1510  PROBNP 102 61 30 36   No results for input(s): TSH in the last 8760 hours.  BMP Recent Labs    07/14/20 1559 07/28/20 2234 08/17/20 1230 09/13/20 1524 09/25/20 1746  10/19/20 1509  NA 140   < > 139 138 136 143  K 4.4   < > 4.8 4.7 4.1 4.5  CL 103   < > 100 103 97* 102  CO2 26   < > 26 20 30 24   GLUCOSE 87   < > 127* 90 90 97  BUN 13   < > 16 17 16 10   CREATININE 1.11   < > 1.22 1.21 1.24 1.06  CALCIUM 9.3   < > 9.6 9.3 9.5 9.1  GFRNONAA 75   < > 67 67 >60  --   GFRAA 87  --  78 78  --   --    < > = values in this interval not displayed.    HEMOGLOBIN A1C Lab Results  Component Value Date   HGBA1C 6.0 (H) 09/18/2014   MPG 126 09/18/2014    IMPRESSION:    ICD-10-CM   1. Chronic combined systolic and diastolic CHF, NYHA class 2 (HCC)  I50.42   2. Cardiomyopathy, unspecified type ([place])  I42.9   3. Mixed hyperlipidemia  E78.2   4. RBBB  I45.10   5. Schizophrenia, unspecified type ([place])  F20.9      RECOMMENDATIONS: [name] Branch is a 55 y.o. 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  Chronic combined systolic and diastolic heart failure, NYHA class II, stage B: Heart failure medications reconciled. Independently reviewed labs from March 22nd 2022 with the patient and his sister.   Discontinue Aldactone 12.5 mg p.o. every morning- due hypotension. We will also reduce his Entresto to 49/51mg  po bid.  Orthostatic vital signs negative. Continue the current GDMT, will hold off uptitration for now due to soft BP.   We will plan for a follow-up echocardiogram in May 2022.  To reevaluate LVEF  Cardiomyopathy, presumed NICMP: See above.   Hyperlipidemia, mixed: Continue statin.  Currently managed by primary care  provider.  RBBB: Continue monitor.   Continue follow-up with PCP regarding the management of his other chronic comorbid conditions.  Patient's Sister [name] Branch is informed that if he develops any focal neurological deficits, syncope, near syncope he should be evaluated sooner either by going to the ER or outpatient neurology consultation.  I have asked her to discuss this further with his PCP.  FINAL MEDICATION LIST END OF ENCOUNTER:   Current Outpatient Medications:  .  carbamide peroxide (DEBROX) 6.5 % OTIC solution, 5 drops 2 (two) times daily., Disp: , Rfl:  .  cetirizine (ZYRTEC) 10 MG tablet, Take 10 mg by mouth daily., Disp: , Rfl:  .  cloZAPine (CLOZARIL) 100 MG tablet, Take 250 mg by mouth at bedtime., Disp: , Rfl:  .  dapagliflozin propanediol (FARXIGA) 10 MG TABS tablet, Take 10 mg by mouth every morning., Disp: , Rfl:  .  Dextromethorphan-guaiFENesin (MUCINEX DM) 30-600 MG TB12, Take 1 tablet by mouth 2 (two) times daily., Disp: , Rfl:  .  metoprolol succinate (TOPROL-XL) 25 MG 24 hr tablet, Take 25 mg by mouth every evening., Disp: , Rfl:  .  MUCUS RELIEF DM COUGH 20-400 MG TABS, Take 10 mg by mouth in the morning and at bedtime., Disp: , Rfl:  .  Multiple Vitamin (MULTIVITAMIN) capsule, Take 1 capsule by mouth daily., Disp: 90 capsule, Rfl: 3 .  pravastatin (PRAVACHOL) 40 MG tablet, TAKE ONE TABLET EACH DAY, Disp: 90 tablet, Rfl: 3 .  sacubitril-valsartan (ENTRESTO) 97-103 MG, Take 1 tablet by mouth 2 (two) times daily., Disp: 180 tablet, Rfl: 0 .  sodium fluoride (PREVIDENT 5000 PLUS) 1.1 % CREA dental cream, Use a "grape-size" amount on a soft toothbrush every night.  Do not rinse with water or eat for at least 5 minutes after brushing teeth., Disp: 51 g, Rfl: 12  No orders of the defined types were placed in this  encounter.  There are no Patient Instructions on file for this visit.   --Continue cardiac medications as reconciled in final medication list. --Return in about 2  months (around 12/20/2020) for Follow up, heart failure management.. Or sooner if needed. --Continue follow-up with your primary care physician regarding the management of your other chronic comorbid conditions.  Patient's questions and concerns were addressed to his satisfaction. He voices understanding of the instructions provided during this encounter.   This note was created using a voice recognition software as a result there may be grammatical errors inadvertently enclosed that do not reflect the nature of this encounter. Every attempt is made to correct such errors.  [name] Branch, [place], [facility]  Pager: [phone] Office: [phone]

## 2020-10-20 ENCOUNTER — Other Ambulatory Visit: Payer: Self-pay

## 2020-10-20 ENCOUNTER — Encounter: Payer: Self-pay | Admitting: Cardiology

## 2020-10-20 ENCOUNTER — Ambulatory Visit: Payer: Medicare Other | Admitting: Cardiology

## 2020-10-20 VITALS — BP 100/70 | HR 109 | Ht 74.0 in | Wt 232.0 lb

## 2020-10-20 DIAGNOSIS — I5042 Chronic combined systolic (congestive) and diastolic (congestive) heart failure: Secondary | ICD-10-CM

## 2020-10-20 DIAGNOSIS — I4510 Unspecified right bundle-branch block: Secondary | ICD-10-CM

## 2020-10-20 DIAGNOSIS — F209 Schizophrenia, unspecified: Secondary | ICD-10-CM

## 2020-10-20 DIAGNOSIS — I429 Cardiomyopathy, unspecified: Secondary | ICD-10-CM

## 2020-10-20 DIAGNOSIS — E782 Mixed hyperlipidemia: Secondary | ICD-10-CM

## 2020-10-20 LAB — BASIC METABOLIC PANEL
BUN/Creatinine Ratio: 9 (ref 9–20)
BUN: 10 mg/dL (ref 6–24)
CO2: 24 mmol/L (ref 20–29)
Calcium: 9.1 mg/dL (ref 8.7–10.2)
Chloride: 102 mmol/L (ref 96–106)
Creatinine, Ser: 1.06 mg/dL (ref 0.76–1.27)
Glucose: 97 mg/dL (ref 65–99)
Potassium: 4.5 mmol/L (ref 3.5–5.2)
Sodium: 143 mmol/L (ref 134–144)
eGFR: 83 mL/min/{1.73_m2} (ref >59)

## 2020-10-20 LAB — MAGNESIUM: Magnesium: 2.2 mg/dL (ref 1.6–2.3)

## 2020-10-20 LAB — PRO B NATRIURETIC PEPTIDE: NT-Pro BNP: 36 pg/mL (ref 0–121)

## 2020-10-22 NOTE — Progress Notes (Signed)
Subjective:   Patient ID: [name] Branch, male   DOB: 55 y.o.   MRN: [id number]   HPI Patient presents with significant thick toenails of both feet that not been cut in almost a year and lesions that are painful   ROS      Objective:  Physical Exam  Neurovascular status unchanged thick yellow brittle toenails 1-5 both feet painful with lesion formation that is painful also     Assessment:  Mycotic nail infection 1-5 both feet lesion formation painful      Plan:  H&P reviewed condition debrided nailbeds 1-5 both feet lesions no iatrogenic bleeding reappoint routine care

## 2020-10-26 ENCOUNTER — Other Ambulatory Visit: Payer: Medicare Other

## 2020-11-01 ENCOUNTER — Telehealth: Payer: Self-pay

## 2020-11-01 NOTE — Telephone Encounter (Signed)
Tried calling patient's caregiver to verify dose no answer left vm

## 2020-11-01 NOTE — Telephone Encounter (Signed)
Can you verify the Entresto dose?

## 2020-11-01 NOTE — Telephone Encounter (Signed)
Patient's caregiver called to give [place] patient's most recent bp readings all the reading she gave me were for the morning only on 4/01 he was at the doctor around 3:25pm it was 117/69 w/ hr of 85   3/24 151/82 hr 110  3/25 157/101 hr 108  3/28 112/66 hr 106  3/30 156/97 hr 98  3/31 153/75 hr 105 3/26 146/94  3/26 143/93 hr 95  4/01 150/91 4/4 155/94 hr 104

## 2020-11-03 NOTE — Telephone Encounter (Signed)
Entresto 97-103, and per caregiver, patient is currently taking a 1/2 tablet in the morning and a 1/2 tablet at bedtime.

## 2020-11-09 NOTE — Telephone Encounter (Signed)
He can take a full tablet in am and pm as his BP has improved. Call if any questions.

## 2020-11-18 ENCOUNTER — Telehealth: Payer: Self-pay | Admitting: Nurse Practitioner

## 2020-11-18 NOTE — Telephone Encounter (Signed)
It will be difficult for patient to undergo bowel prep for colorectal cancer screening. He doesn't have a recall.

## 2020-11-18 NOTE — Telephone Encounter (Addendum)
Inbound call from patient's guardian requesting a call back please.  She is wanting to know if patient is due to have colonoscopy.  Please advise.

## 2020-11-22 NOTE — Telephone Encounter (Signed)
Mailbox is full and cannot accept messages. 

## 2020-11-24 ENCOUNTER — Other Ambulatory Visit: Payer: Self-pay

## 2020-11-24 ENCOUNTER — Telehealth: Payer: Self-pay

## 2020-11-24 MED ORDER — ENTRESTO 49-51 MG PO TABS: 1.0000 | ORAL_TABLET | Freq: Two times a day (BID) | ORAL | 1 refills | Status: DC

## 2020-11-24 MED ORDER — ENTRESTO 97-103 MG PO TABS: 1.0000 | ORAL_TABLET | Freq: Two times a day (BID) | ORAL | 1 refills | Status: DC

## 2020-11-24 NOTE — Telephone Encounter (Signed)
Patient's sister [name] fmla forms have been completed per ST and patient is aware to come pick them up either today 4/27 or tomorrow 2/28. Copies have been made as well have been scanned into patient's chart.

## 2020-12-06 ENCOUNTER — Other Ambulatory Visit: Payer: Self-pay

## 2020-12-06 ENCOUNTER — Ambulatory Visit: Payer: Medicare Other

## 2020-12-06 DIAGNOSIS — I5042 Chronic combined systolic (congestive) and diastolic (congestive) heart failure: Secondary | ICD-10-CM

## 2020-12-07 ENCOUNTER — Other Ambulatory Visit (HOSPITAL_BASED_OUTPATIENT_CLINIC_OR_DEPARTMENT_OTHER): Payer: Self-pay

## 2020-12-07 DIAGNOSIS — R5383 Other fatigue: Secondary | ICD-10-CM

## 2020-12-07 DIAGNOSIS — R0683 Snoring: Secondary | ICD-10-CM

## 2020-12-31 ENCOUNTER — Other Ambulatory Visit: Payer: Self-pay

## 2020-12-31 ENCOUNTER — Ambulatory Visit: Payer: Medicare Other | Admitting: Cardiology

## 2020-12-31 ENCOUNTER — Encounter: Payer: Self-pay | Admitting: Cardiology

## 2020-12-31 VITALS — BP 129/76 | HR 102 | Temp 97.2°F | Resp 16 | Ht 74.0 in | Wt 247.0 lb

## 2020-12-31 DIAGNOSIS — I4510 Unspecified right bundle-branch block: Secondary | ICD-10-CM

## 2020-12-31 DIAGNOSIS — E782 Mixed hyperlipidemia: Secondary | ICD-10-CM

## 2020-12-31 DIAGNOSIS — I5042 Chronic combined systolic (congestive) and diastolic (congestive) heart failure: Secondary | ICD-10-CM

## 2020-12-31 DIAGNOSIS — I429 Cardiomyopathy, unspecified: Secondary | ICD-10-CM

## 2020-12-31 DIAGNOSIS — F209 Schizophrenia, unspecified: Secondary | ICD-10-CM

## 2020-12-31 MED ORDER — METOPROLOL SUCCINATE ER 50 MG PO TB24: 50.0000 mg | ORAL_TABLET | Freq: Every morning | ORAL | 0 refills | Status: DC

## 2020-12-31 NOTE — Progress Notes (Signed)
ID:  [name] Branch, DOB 08-Jan-1966, MRN [id number]  PCP:  [name], MD  Cardiologist: Rex Kras, DO, [facility] (established care 07/12/2020) Former Cardiology Providers: Dr. Cloretta Ned and Vilinda Flake PA.  Date: 12/31/20 Last Office Visit: 10/20/2020  Chief Complaint  Patient presents with  . Chronic combined systolic and diastolic   . Follow-up    3 month    HPI  [name] Branch is a 55 y.o. male who presents to the office with a chief complaint of " 81-month follow-up for congestive heart failure management." Patient's past medical history and cardiovascular risk factors include: Continue He is referred to the office at the request of [name] Branch, [name] Branch* for evaluation of cardiomyopathy and chronic congestive heart.  Given his intellectual disability and schizophrenia his is accompanied by his guardian [name] Branch (sister / guardian) [phone].   He was formally under the care of Dr. 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  Based on EMR patient is noted to have cardiomyopathy since 2016 at the time of colon surgery.  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 Patient's sister states that he subsequently underwent surgery and completed radiation treatment as of 06/16/2020.  Since establishing care with myself his GDMT has been uptitrated in a stepwise fashion and he underwent a repeat echocardiogram which notes preserved LVEF of 50-55% and grade 1 diastolic impairment.  He presents today for 70-month follow-up.  Clinically he denies any chest pain or angina pectoris.  He is overall euvolemic and not in congestive heart failure.  Patient's sister states that his blood pressures have been ranging around 140-150 mmHg and his heart  rate is usually greater than 100 bpm.  No recent hospitalizations since last office encounter.  FUNCTIONAL STATUS: Able to ambulate 24minutes five day a week, depending on the weather.   ALLERGIES: Allergies  Allergen Reactions  . Hydrocodone Other (See Comments)    Exacerbation of urinary and fecal incontinence    MEDICATION LIST PRIOR TO VISIT: Current Meds  Medication Sig  . benzonatate (TESSALON) 100 MG capsule Take 100 mg by mouth 3 (three) times daily.  . carbamide peroxide (DEBROX) 6.5 % OTIC solution 5 drops 2 (two) times daily.  . cetirizine (ZYRTEC) 10 MG tablet Take 10 mg by mouth daily.  . clozapine (CLOZARIL) 50 MG tablet Take 50 mg by mouth at bedtime.  . dapagliflozin propanediol (FARXIGA) 10 MG TABS tablet Take 10 mg by mouth every morning.  Marland Kitchen Dextromethorphan-guaiFENesin (MUCINEX DM) 30-600 MG TB12 Take 1 tablet by mouth 2 (two) times daily.  . fluticasone (FLONASE) 50 MCG/ACT nasal spray Place into both nostrils.  . fluvoxaMINE (LUVOX) 100 MG tablet Take by mouth.  Marland Kitchen ketoconazole (NIZORAL) 2 % cream Apply topically daily.  Marland Kitchen latanoprost (XALATAN) 0.005 % ophthalmic solution Place 1 drop into both eyes nightly.  . metoprolol succinate (TOPROL-XL) 50 MG 24 hr tablet Take 1 tablet (50 mg total) by mouth in the morning. Hold if top blood pressure number less than 100 mmHg or heart rate less than 60 bpm (pulse).  . MUCUS RELIEF DM COUGH 20-400 MG TABS Take 10 mg by mouth in the morning and at bedtime.  . Multiple Vitamin (MULTIVITAMIN) capsule Take 1 capsule by mouth daily.  . pravastatin (PRAVACHOL) 40 MG tablet TAKE ONE TABLET EACH DAY  .  sacubitril-valsartan (ENTRESTO) 49-51 MG Take 1 tablet by mouth 2 (two) times daily.  . sodium fluoride (PREVIDENT 5000 PLUS) 1.1 % CREA dental cream Use a "grape-size" amount on a soft toothbrush every night.  Do not rinse with water or eat for at least 5 minutes after brushing teeth.  . [DISCONTINUED] metoprolol succinate (TOPROL-XL)  25 MG 24 hr tablet Take 25 mg by mouth every evening.     PAST MEDICAL HISTORY: Past Medical History:  Diagnosis Date  . ABSCESS, ANAL/RECTAL REGIONS 08/18/2006   Annotation: Fournier gangrene Qualifier: History of  By: Garnette Scheuermann MD, Arlee Muslim    . Cardiomyopathy   . CHF (congestive heart failure) ([place])   . Colonic inertia   . Constipation   . Glaucoma   . Hyperlipidemia   . Hypovolemic shock ([place])   . Incontinent of feces   . Mental retardation   . 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   Annotation: chronic with stercoral ulcer  Qualifier: Diagnosis of  By: Garnette Scheuermann MD, Arlee Muslim    . Obstruction of colon (HCC)    recurrent  . Sarcoidosis   . Schizophrenia ([place])   . 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 Total self-care deficit    per caregiver he needs to have help bathing and cleaning self and needs help with all daily needs now     PAST SURGICAL HISTORY: Past Surgical History:  Procedure Laterality Date  . COLECTOMY N/A 09/25/2014   Procedure: OPEN ABDOMINAL COLECTOMY;  Surgeon: Michael Boston, MD;  Location: [facility];  Service: General;  Laterality: N/A;  . COLONOSCOPY  2007  . groin surgery     boil drained  . MOUTH SURGERY    . PROCTOSCOPY N/A 09/25/2014   Procedure: RIGID PROCTOSCOPY;  Surgeon: Michael Boston, MD;  Location: [facility];  Service: General;  Laterality: N/A;    FAMILY HISTORY: The patient family history includes Diabetes in his mother.  SOCIAL HISTORY:  The patient  reports that he has never smoked. He has never used smokeless tobacco. He reports that he does not drink alcohol and does not use drugs.  REVIEW OF SYSTEMS: Review of Systems  Constitutional: Negative for chills and fever.  HENT: Negative for hoarse voice and nosebleeds.   Eyes: Negative for discharge, double vision and pain.  Cardiovascular: Negative for chest pain, claudication, dyspnea on exertion, leg swelling, near-syncope, orthopnea, palpitations, paroxysmal nocturnal dyspnea and syncope.  Respiratory: Negative  for hemoptysis and shortness of breath.   Musculoskeletal: Negative for muscle cramps and myalgias.  Gastrointestinal: Negative for abdominal pain, constipation, diarrhea, hematemesis, hematochezia, melena, nausea and vomiting.  Neurological: Negative for dizziness and light-headedness.   PHYSICAL EXAM: Vitals with BMI 12/31/2020 10/20/2020 10/20/2020  Height 6\' 2"  6\' 2"  6\' 2"   Weight 247 lbs 232 lbs 232 lbs  BMI 31.7 09.32 35.57  Systolic 322 - 025  Diastolic 76 - 70  Pulse 427 - 109    CONSTITUTIONAL: Appears older than stated age, hemodynamically stable, no acute distress.  SKIN: Skin is warm and dry. No rash noted. No cyanosis. No pallor. No jaundice HEAD: Normocephalic and atraumatic.  EYES: No scleral icterus MOUTH/THROAT: Moist oral membranes.  NECK: No JVD present. No thyromegaly noted. No carotid bruits  LYMPHATIC: No visible cervical adenopathy.  CHEST Normal respiratory effort. No intercostal retractions  LUNGS: Clear to auscultation bilaterally. No stridor. No wheezes. No rales.  CARDIOVASCULAR: Regular, tachycardia, positive S1-S2, no murmurs rubs or gallops appreciated. ABDOMINAL: No apparent ascites.  EXTREMITIES: No peripheral edema, 2+ dorsalis  pedis and posterior tibial pulses. HEMATOLOGIC: No significant bruising NEUROLOGIC: Oriented to person, place, and time. Nonfocal. Normal muscle tone.  PSYCHIATRIC: Normal mood and affect. Normal behavior. Cooperative  CARDIAC DATABASE: EKG: 09/30/2020: Normal sinus rhythm, 88 bpm, left axis deviation, right bundle branch block, without underlying injury pattern.    Echocardiogram: Echo 03/10/2020 and [facility] MODERATE LV DYSFUNCTION EF 35%, LV normal size NORMAL RIGHT VENTRICULAR SYSTOLIC FUNCTION VALVULAR REGURGITATION: MILD AR, TRIVIAL MR, TRIVIAL PR, TRIVIAL TR NO VALVULAR STENOSIS  12/06/2020: Left ventricle cavity is normal in size. Mild concentric hypertrophy of the left ventricle. Normal global wall  motion. Normal LV systolic function with EF 50-55%. Doppler evidence of grade I (impaired) diastolic dysfunction, normal LAP.  Structurally normal trileaflet aortic valve. Mild (Grade I) aortic regurgitation. Mild tricuspid regurgitation.  No evidence of pulmonary hypertension.  Stress Testing: No results found for this or any previous visit from the past 1095 days.  Heart Catheterization: None  LABORATORY DATA: CBC Latest Ref Rng & Units 09/25/2020 07/28/2020 02/19/2018  WBC 4.0 - 10.5 K/uL 4.5 4.1 4.8  Hemoglobin 13.0 - 17.0 g/dL 13.8 14.7 13.9  Hematocrit 39.0 - 52.0 % 43.3 48.7 42.7  Platelets 150 - 400 K/uL 145(L) 161 163.0    CMP Latest Ref Rng & Units 10/19/2020 09/25/2020 09/13/2020  Glucose 65 - 99 mg/dL 97 90 90  BUN 6 - 24 mg/dL 10 16 17   Creatinine 0.76 - 1.27 mg/dL 1.06 1.24 1.21  Sodium 134 - 144 mmol/L 143 136 138  Potassium 3.5 - 5.2 mmol/L 4.5 4.1 4.7  Chloride 96 - 106 mmol/L 102 97(L) 103  CO2 20 - 29 mmol/L 24 30 20   Calcium 8.7 - 10.2 mg/dL 9.1 9.5 9.3  Total Protein 6.5 - 8.1 g/dL - 7.8 -  Total Bilirubin 0.3 - 1.2 mg/dL - 0.7 -  Alkaline Phos 38 - 126 U/L - 109 -  AST 15 - 41 U/L - 42(H) -  ALT 0 - 44 U/L - 63(H) -    Lipid Panel     Component Value Date/Time   CHOL 133 03/25/2018 1155   TRIG 64 03/25/2018 1155   HDL 59 03/25/2018 1155   CHOLHDL 2.3 03/25/2018 1155   CHOLHDL 3.4 02/25/2014 1034   VLDL 11 02/25/2014 1034   LDLCALC 61 03/25/2018 1155   LABVLDL 13 03/25/2018 1155    No components found for: NTPROBNP Recent Labs    07/14/20 1559 08/17/20 1230 09/13/20 1524 10/19/20 1510  PROBNP 102 61 30 36   No results for input(s): TSH in the last 8760 hours.  BMP Recent Labs    07/14/20 1559 07/28/20 2234 08/17/20 1230 09/13/20 1524 09/25/20 1746 10/19/20 1509  NA 140   < > 139 138 136 143  K 4.4   < > 4.8 4.7 4.1 4.5  CL 103   < > 100 103 97* 102  CO2 26   < > 26 20 30 24   GLUCOSE 87   < > 127* 90 90 97  BUN 13   < > 16 17 16  10   CREATININE 1.11   < > 1.22 1.21 1.24 1.06  CALCIUM 9.3   < > 9.6 9.3 9.5 9.1  GFRNONAA 75   < > 67 67 >60  --   GFRAA 87  --  78 78  --   --    < > = values in this interval not displayed.    HEMOGLOBIN A1C Lab Results  Component Value  Date   HGBA1C 6.0 (H) 09/18/2014   MPG 126 09/18/2014    IMPRESSION:    ICD-10-CM   1. Chronic combined systolic and diastolic CHF, NYHA class 2 (HCC)  I50.42 metoprolol succinate (TOPROL-XL) 50 MG 24 hr tablet  2. Mixed hyperlipidemia  E78.2   3. Cardiomyopathy, unspecified type ([place])  I42.9   4. RBBB  I45.10   5. Schizophrenia, unspecified type ([place])  F20.9      RECOMMENDATIONS: [name] Branch is a 55 y.o. 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  Chronic heart failure with preserved EF, NYHA class II, stage B: Heart failure medications reconciled. We will increase Toprol-XL to 50 mg p.o. every morning. Recommended uptitrating Entresto to 97/103 mg p.o. twice daily however, patient's sister is concerned that he may become hypotensive requiring hospitalization like in the past.  They would like to continue the current guideline directed medical therapy without up titration. Aldactone has been discontinued due to hypotension in the past.  They do not want to retrial the medication. Echocardiogram notes preserved LVEF with 50-55% and grade 1 diastolic impairment. She is encouraged to call the office back if his heart rate continues to be greater than 90 bpm.  Also recommend reaching out if his systolic blood pressures are consistently greater than 130 mmHg.  Recovered cardiomyopathy: See above.   Hyperlipidemia, mixed:  Continue statin.  Currently managed by primary care provider.  RBBB: Continue monitor.   [name] Branch's FMLA paperwork were refilled and given to the patient at today's visit  FINAL MEDICATION LIST END OF ENCOUNTER:   Current Outpatient Medications:  .  benzonatate (TESSALON) 100 MG capsule, Take 100 mg by mouth 3 (three) times daily., Disp: , Rfl:  .  carbamide peroxide (DEBROX) 6.5 % OTIC solution, 5 drops 2 (two) times daily., Disp: , Rfl:  .  cetirizine (ZYRTEC) 10 MG tablet, Take 10 mg by mouth daily., Disp: , Rfl:  .  clozapine (CLOZARIL) 50 MG tablet, Take 50 mg by mouth at bedtime., Disp: , Rfl:  .  dapagliflozin propanediol (FARXIGA) 10 MG TABS tablet, Take 10 mg by mouth every morning., Disp: , Rfl:  .  Dextromethorphan-guaiFENesin (MUCINEX DM) 30-600 MG TB12, Take 1 tablet by mouth 2 (two) times daily., Disp: , Rfl:  .  fluticasone (FLONASE) 50 MCG/ACT nasal spray, Place into both nostrils., Disp: , Rfl:  .  fluvoxaMINE (LUVOX) 100 MG tablet, Take by mouth., Disp: , Rfl:  .  ketoconazole (NIZORAL) 2 % cream, Apply topically daily., Disp: , Rfl:  .  latanoprost (XALATAN) 0.005 % ophthalmic solution, Place 1 drop into both eyes nightly., Disp: , Rfl:  .  metoprolol succinate (TOPROL-XL) 50 MG 24 hr tablet, Take 1 tablet (50 mg total) by mouth in the morning. Hold if top blood pressure number less than 100 mmHg or heart rate less than 60 bpm (pulse)., Disp: 90 tablet, Rfl: 0 .  MUCUS RELIEF DM COUGH 20-400 MG TABS, Take 10 mg by mouth in the morning and at bedtime., Disp: , Rfl:  .  Multiple Vitamin (MULTIVITAMIN) capsule, Take 1 capsule by mouth daily., Disp: 90 capsule, Rfl: 3 .  pravastatin (PRAVACHOL) 40 MG tablet, TAKE ONE TABLET EACH DAY, Disp: 90 tablet, Rfl: 3 .  sacubitril-valsartan (ENTRESTO) 49-51 MG, Take 1  tablet by mouth 2 (two) times daily., Disp: 60 tablet, Rfl: 1 .  sodium fluoride (PREVIDENT 5000 PLUS) 1.1 % CREA dental cream, Use a "grape-size" amount on a soft toothbrush  every night.  Do not rinse with water or eat for at least 5 minutes after brushing teeth., Disp: 51 g, Rfl: 12  No orders of the defined types were placed in this encounter.  There are no Patient Instructions on file for this visit.   --Continue cardiac medications as reconciled in final medication list. --Return in about 6 months (around 07/02/2021) for Follow up recovered cardiomyopathy. . Or sooner if needed. --Continue follow-up with your primary care physician regarding the management of your other chronic comorbid conditions.  Patient's questions and concerns were addressed to his satisfaction. He voices understanding of the instructions provided during this encounter.   This note was created using a voice recognition software as a result there may be grammatical errors inadvertently enclosed that do not reflect the nature of this encounter. Every attempt is made to correct such errors.  Rex Kras, [place], [facility]  Pager: [phone] Office: [phone]

## 2021-01-03 ENCOUNTER — Other Ambulatory Visit: Payer: Self-pay

## 2021-01-03 MED ORDER — ENTRESTO 49-51 MG PO TABS: 1.0000 | ORAL_TABLET | Freq: Two times a day (BID) | ORAL | 1 refills | Status: DC

## 2021-01-12 ENCOUNTER — Ambulatory Visit: Payer: Medicare Other | Admitting: Nurse Practitioner

## 2021-02-01 ENCOUNTER — Other Ambulatory Visit: Payer: Self-pay

## 2021-02-01 DIAGNOSIS — I5042 Chronic combined systolic (congestive) and diastolic (congestive) heart failure: Secondary | ICD-10-CM

## 2021-02-01 MED ORDER — METOPROLOL SUCCINATE ER 50 MG PO TB24: 50.0000 mg | ORAL_TABLET | Freq: Every morning | ORAL | 0 refills | Status: DC

## 2021-02-02 ENCOUNTER — Telehealth: Payer: Self-pay

## 2021-02-02 NOTE — Telephone Encounter (Signed)
If his BP and HR are 145/85 and 100bpm respectively it is less likely his medications are causing him those symptoms.   I would have him evaluated by his PCP.

## 2021-02-04 NOTE — Telephone Encounter (Signed)
No answer and wasn't able to leave a vm mailbox is full

## 2021-02-04 NOTE — Telephone Encounter (Signed)
Tried calling again no answer left a vm

## 2021-02-09 ENCOUNTER — Encounter: Payer: Self-pay | Admitting: Gastroenterology

## 2021-02-09 ENCOUNTER — Ambulatory Visit (INDEPENDENT_AMBULATORY_CARE_PROVIDER_SITE_OTHER): Payer: Medicare Other | Admitting: Gastroenterology

## 2021-02-09 VITALS — BP 126/64 | HR 70 | Ht 74.0 in | Wt 262.0 lb

## 2021-02-09 DIAGNOSIS — Z1212 Encounter for screening for malignant neoplasm of rectum: Secondary | ICD-10-CM

## 2021-02-09 DIAGNOSIS — Z1211 Encounter for screening for malignant neoplasm of colon: Secondary | ICD-10-CM

## 2021-02-09 NOTE — Telephone Encounter (Signed)
Spoke to patient's caregiver she voiced understanding she will schedule him an appt with his PCP and I advised her to monitor his bp and hr

## 2021-02-09 NOTE — Patient Instructions (Signed)
If you are age 54 or younger, your body mass index should be between 19-25. Your Body mass index is 33.64 kg/m. If this is out of the aformentioned range listed, please consider follow up with your Primary Care Provider.  __________________________________________________________  The [facility] providers would like to encourage you to use [facility] to communicate with providers for non-urgent requests or questions.  Due to long hold times on the telephone, sending your provider a message by [facility] may be a faster and more efficient way to get a response.  Please allow 48 business hours for a response.  Please remember that this is for non-urgent requests.   You have been scheduled for a flexible sigmoidoscopy. Please follow the written instructions given to you at your visit today. If you use inhalers (even only as needed), please bring them with you on the day of your procedure.  Follow up pending the results of your Flex Sig.  Thank you for entrusting me with your care and choosing [facility]  Alonza Bogus, PA-C

## 2021-02-09 NOTE — Progress Notes (Signed)
02/09/2021 [name] Branch [id number] Sep 18, 1965   HISTORY OF PRESENT ILLNESS: This is a 55 year old male with past medical history that includes sarcoidosis, schizophrenia, mental retardation, chronic colonic inertia status post total colectomy with ileorectal anastomosis in 2016, glaucoma, and salivary cancer that was treated with excision and radiation therapy.  He is here today with his caregiver who is also his sister to discuss flexible sigmoidoscopy for rectal cancer screening.  His last flexible sigmoidoscopy was in September 2019 at which time that was suboptimal due to poor prep with a large amount of stool in the rectum.  There was erythema and friable mucosa at the ileocolonic anastomosis and biopsy showed mild focal active colitis that is nonspecific without chronic changes.   Past Medical History:  Diagnosis Date   ABSCESS, ANAL/RECTAL REGIONS 08/18/2006   Annotation: Fournier gangrene Qualifier: History of  By: Garnette Scheuermann MD, Yogesh     Cardiomyopathy    CHF (congestive heart failure) ([place])    Colonic inertia    Constipation    Glaucoma    Hyperlipidemia    Hypovolemic shock ([place])    Incontinent of feces    Mental retardation    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   Annotation: chronic with stercoral ulcer  Qualifier: Diagnosis of  By: Garnette Scheuermann MD, Yogesh     Obstruction of colon ([place])    recurrent   Sarcoidosis    Schizophrenia ([place])    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 He has never used smokeless tobacco. He reports that he does not drink alcohol and does not use drugs. family history includes Diabetes in his mother. Allergies  Allergen Reactions   Hydrocodone Other (See Comments)    Exacerbation of urinary and fecal incontinence      Outpatient Encounter Medications as of 02/09/2021  Medication Sig   carbamide peroxide (DEBROX) 6.5 % OTIC solution 5 drops 2 (two) times daily.   cetirizine (ZYRTEC) 10 MG tablet Take 10 mg by mouth daily.   clozapine (CLOZARIL) 50 MG tablet Take 50 mg by mouth at bedtime.   dapagliflozin propanediol (FARXIGA) 10 MG TABS tablet Take 10 mg by mouth every morning.   Dextromethorphan-guaiFENesin (MUCINEX DM) 30-600 MG TB12 Take 1 tablet by mouth 2 (two) times daily.   fluticasone (FLONASE) 50 MCG/ACT nasal spray Place into both nostrils.   fluvoxaMINE (LUVOX) 100 MG tablet Take 300 mg by mouth at bedtime.   ketoconazole (NIZORAL) 2 % cream Apply topically daily.   latanoprost (XALATAN) 0.005 % ophthalmic solution Place 1 drop into both eyes nightly.   metoprolol succinate (TOPROL-XL) 50 MG 24 hr tablet Take 1 tablet (50 mg total) by mouth in the morning. Hold if top blood pressure number less than 100 mmHg or heart rate less than 60 bpm (pulse).   MUCUS RELIEF DM COUGH  20-400 MG TABS Take 10 mg by mouth in the morning and at bedtime.   Multiple Vitamin (MULTIVITAMIN) capsule Take 1 capsule by mouth daily.   pravastatin (PRAVACHOL) 40 MG tablet TAKE ONE TABLET EACH DAY   sacubitril-valsartan (ENTRESTO) 49-51 MG Take 1 tablet by mouth 2 (two) times daily.   sodium fluoride (PREVIDENT 5000 PLUS) 1.1 % CREA dental cream Use a "grape-size" amount on a soft toothbrush every night.  Do not rinse with water or eat for at least 5 minutes after brushing teeth.   [DISCONTINUED] benzonatate (TESSALON) 100 MG capsule Take 100 mg by mouth 3 (three) times daily.    [DISCONTINUED] spironolactone (ALDACTONE) 25 MG tablet Take 0.5 tablets (12.5 mg total) by mouth in the morning.   No facility-administered encounter medications on file as of 02/09/2021.    REVIEW OF SYSTEMS  : All other systems reviewed and negative except where noted in the History of Present Illness.   PHYSICAL EXAM: BP 126/64   Pulse 70   Ht 6\' 2"  (1.88 m)   Wt 262 lb (118.8 kg)   BMI 33.64 kg/m  General: Well developed AA male no acute distress Head: Normocephalic and atraumatic Eyes:  Sclerae anicteric, conjunctiva pink. Ears: Normal auditory acuity Lungs: Clear throughout to auscultation; no W/R/R. Heart: Regular rate and rhythm; no M/R/G. Abdomen: Soft, non-distended.  BS present.  Non-tender. Rectal:  Will be done at the time of colonoscopy. Musculoskeletal: Symmetrical with no gross deformities  Skin: No lesions on visible extremities Extremities: No edema  Neurological: Alert oriented x 4, grossly non-focal Psychological:  Alert and cooperative. Normal mood and affect  ASSESSMENT AND PLAN: # 55 yo male with history of chronic colonic inertia, status post total colectomy with ileorectal anastomosis in 2016.  Flex sig in 2019 with poor prep.  Sent here to consider repeat cancer screening. -We will tentatively plan for flexible sigmoidoscopy with Dr. Silverio Decamp.  Discussed with the sister who is also his caregiver and she thinks that it would be easiest just to prep him as if he were having a full colonoscopy rather than using enemas, etc.  The risks, benefits, and alternatives to colonoscopy were discussed with the patient and he consents to proceed.   CC:  [name]*

## 2021-02-18 ENCOUNTER — Ambulatory Visit (HOSPITAL_BASED_OUTPATIENT_CLINIC_OR_DEPARTMENT_OTHER): Payer: Medicare Other | Attending: Family | Admitting: Internal Medicine

## 2021-02-18 ENCOUNTER — Other Ambulatory Visit: Payer: Self-pay

## 2021-02-18 VITALS — Ht 74.0 in | Wt 250.0 lb

## 2021-02-18 DIAGNOSIS — R0683 Snoring: Secondary | ICD-10-CM | POA: Diagnosis present

## 2021-02-18 DIAGNOSIS — G4733 Obstructive sleep apnea (adult) (pediatric): Secondary | ICD-10-CM

## 2021-02-18 DIAGNOSIS — R5383 Other fatigue: Secondary | ICD-10-CM

## 2021-02-22 ENCOUNTER — Ambulatory Visit (INDEPENDENT_AMBULATORY_CARE_PROVIDER_SITE_OTHER): Payer: Medicare Other | Admitting: Podiatry

## 2021-02-22 ENCOUNTER — Other Ambulatory Visit: Payer: Self-pay

## 2021-02-22 ENCOUNTER — Encounter: Payer: Self-pay | Admitting: Podiatry

## 2021-02-22 DIAGNOSIS — B351 Tinea unguium: Secondary | ICD-10-CM

## 2021-02-22 DIAGNOSIS — M79674 Pain in right toe(s): Secondary | ICD-10-CM | POA: Diagnosis not present

## 2021-02-22 DIAGNOSIS — M79675 Pain in left toe(s): Secondary | ICD-10-CM

## 2021-02-22 NOTE — Progress Notes (Signed)
This patient returns to the office for evaluation and treatment of long thick painful nails .  This patient is unable to trim his own nails since the patient cannot reach his feet.  Patient says the nails are painful walking and wearing his shoes. He presents to the office with male caregiver. His caregiver tries to keep his nails short.  He returns for preventive foot care services.  General Appearance  Alert, conversant and in no acute stress.  Vascular  Dorsalis pedis  are palpable  bilaterally.  Posterior tibial pulses are weakly palpable.  Capillary return is within normal limits  bilaterally. Temperature is within normal limits  bilaterally.  Neurologic  Senn-Weinstein monofilament wire test absent  bilaterally. Muscle power within normal limits bilaterally.  Nails Thick disfigured discolored nails with subungual debris  from hallux to fifth toes bilaterally. No evidence of bacterial infection or drainage bilaterally.  Orthopedic  No limitations of motion  feet .  No crepitus or effusions noted.  No bony pathology or digital deformities noted.  Skin  normotropic skin with no porokeratosis noted bilaterally.  No signs of infections or ulcers noted.     Onychomycosis  Pain in toes right foot  Pain in toes left foot  Debridement  of nails  1-5  B/L with a nail nipper.  Nails were then filed using a dremel tool with no incidents.    RTC  3 months.   Gardiner Barefoot DPM

## 2021-02-26 DIAGNOSIS — R0683 Snoring: Secondary | ICD-10-CM | POA: Diagnosis not present

## 2021-02-26 NOTE — Procedures (Signed)
   Patient Name: [name] Branch, [name] Branch Date: 02/18/2021 Gender: Male D.O.B: [birth year] Age (years): 74 Referring Provider: Novella Rob FNP Height (inches): 44 Interpreting Physician: Baird Lyons MD, ABSM Weight (lbs): 250 RPSGT: Baxter Flattery BMI: 34 MRN: [id number] Neck Size: 18.00  CLINICAL INFORMATION Sleep Study Type: NPSG Indication for sleep study: Fatigue, Obesity, Snoring, Witnesses Apnea / Gasping During Sleep Epworth Sleepiness Score: 8  SLEEP STUDY TECHNIQUE As per the AASM Manual for the Scoring of Sleep and Associated Events v2.3 (April 2016) with a hypopnea requiring 4% desaturations.  The channels recorded and monitored were frontal, central and occipital EEG, electrooculogram (EOG), submentalis EMG (chin), nasal and oral airflow, thoracic and abdominal wall motion, anterior tibialis EMG, snore microphone, electrocardiogram, and pulse oximetry.  MEDICATIONS Medications self-administered by patient taken the night of the study : none reported  SLEEP ARCHITECTURE The study was initiated at 9:50:37 PM and ended at 5:17:09 AM.  Sleep onset time was 0.5 minutes and the sleep efficiency was 98.4%%. The total sleep time was 439.6 minutes.  Stage REM latency was 363.5 minutes.  The patient spent 0.3%% of the night in stage N1 sleep, 93.2%% in stage N2 sleep, 1.3%% in stage N3 and 5.2% in REM.  Alpha intrusion was absent.  Supine sleep was 53.44%.  RESPIRATORY PARAMETERS The overall apnea/hypopnea index (AHI) was 37.5 per hour. There were 6 total apneas, including 6 obstructive, 0 central and 0 mixed apneas. There were 269 hypopneas and 1 RERAs.  The AHI during Stage REM sleep was 117.4 per hour.  AHI while supine was 46.5 per hour.  The mean oxygen saturation was 89.5%. The minimum SpO2 during sleep was 81.0%.  loud snoring was noted during this study.  CARDIAC DATA The 2 lead EKG demonstrated sinus rhythm. The mean heart rate was 94.3 beats per minute.  Other EKG findings include: None.  LEG MOVEMENT DATA The total PLMS were 0 with a resulting PLMS index of 0.0. Associated arousal with leg movement index was 0.0 .  IMPRESSIONS - Severe obstructive sleep apnea occurred during this study (AHI = 37.5/h). - Insufficient early events to meet protocol requirements ffor split CPAP titration. - Sustained oxygen desaturation was noted during this study (Min O2 = 81.0%). Mean O2 saturation 89.5%. - The patient snored with loud snoring volume. - Sustained sinus tachycardia with mean heart rate 94.3/ minute. - Clinically significant periodic limb movements did not occur during sleep.  - Very high percentage of total sleep time was in stage N2 with minimal arousal, suggesting possible unreported sedative medication.  DIAGNOSIS - Obstructive Sleep Apnea (G47.33) - Nocturnal Hypoxemia (G47.36)  RECOMMENDATIONS - Advise return for in-center CPAP titration. This will provide required insurance documentation if supplemental O2 is also indicated. - Be careful with alcohol, sedatives and other CNS depressants that may worsen sleep apnea and disrupt normal sleep architecture. - Sleep hygiene should be reviewed to assess factors that may improve sleep quality. - Weight management and regular exercise should be initiated or continued if appropriate.  [Electronically signed] 02/26/2021 11:03 AM  Baird Lyons MD, ABSM Diplomate, American Board of Sleep Medicine   NPI: [id number]                          [place]  ELECTRONICALLY SIGNED ON:  02/26/2021, 10:54 AM [place] PH: [phone]   FX: [phone] [place]

## 2021-03-28 NOTE — Progress Notes (Signed)
Reviewed and agree with documentation and assessment and plan. K. Veena Shauntae Reitman , MD   

## 2021-04-01 ENCOUNTER — Ambulatory Visit
Admission: RE | Admit: 2021-04-01 | Discharge: 2021-04-01 | Disposition: A | Discharge status: Home / Self Care | Payer: Medicare Other | Source: Ambulatory Visit | Attending: Radiation Oncology | Admitting: Radiation Oncology

## 2021-04-01 ENCOUNTER — Other Ambulatory Visit: Payer: Self-pay

## 2021-04-01 ENCOUNTER — Other Ambulatory Visit: Payer: Self-pay | Admitting: Radiation Oncology

## 2021-04-01 ENCOUNTER — Encounter: Payer: Self-pay | Admitting: Radiation Oncology

## 2021-04-01 VITALS — BP 113/56 | HR 101 | Temp 97.8°F | Resp 20 | Ht 74.0 in | Wt 271.2 lb

## 2021-04-01 DIAGNOSIS — Z85819 Personal history of malignant neoplasm of unspecified site of lip, oral cavity, and pharynx: Secondary | ICD-10-CM | POA: Diagnosis present

## 2021-04-01 DIAGNOSIS — R635 Abnormal weight gain: Secondary | ICD-10-CM | POA: Insufficient documentation

## 2021-04-01 DIAGNOSIS — C069 Malignant neoplasm of mouth, unspecified: Secondary | ICD-10-CM | POA: Diagnosis not present

## 2021-04-01 DIAGNOSIS — Z79899 Other long term (current) drug therapy: Secondary | ICD-10-CM | POA: Diagnosis not present

## 2021-04-01 DIAGNOSIS — C050 Malignant neoplasm of hard palate: Secondary | ICD-10-CM

## 2021-04-01 DIAGNOSIS — Z923 Personal history of irradiation: Secondary | ICD-10-CM | POA: Insufficient documentation

## 2021-04-01 LAB — TSH: TSH: 0.855 u[IU]/mL (ref 0.320–4.118)

## 2021-04-01 NOTE — Progress Notes (Signed)
[name] Branch presents today for follow-up after completing radiation to his palate on 06/16/2020  Pain issues, if any: Patient denies (sister/caregiver reports occasionally giving Tylenol if his mood/affect seems off) Using a feeding tube?: N/A Weight changes, if any:  Wt Readings from Last 3 Encounters:  04/01/21 271 lb 3.2 oz (123 kg)  02/18/21 250 lb (113.4 kg)  02/09/21 262 lb (118.8 kg)   Swallowing issues, if any: Overall, sister denies (states he has a healthy appetite and can eat/drink a wide variety). Does note that if he drinks to fast, he often coughs to clear throat, so she's frequently tells him to slow down Smoking or chewing tobacco? None Using fluoride trays daily? N/A--sister reports he sees a dentist about every 2 weeks.  Last ENT visit was on: 02/07/2021 saw oral surgeon [name] ([place])  Plan:   No new clinical evidence of locoregional recurrence   Nasopharyngoscopy showing no concerning right nasal discharge or purulence  Continue oral hygiene and routine dental care  Will see [name] in September 2022  Previously used Mucinex D for nasal congestion with good results; I encouraged that this could be used again in the future.  Follow up in 5-6 months (~3 months after visit with [name]) for continued cancer surveillance.  Other notable issues, if any: Scheduled for colonoscopy on 04/11/2021 for colon cancer screening. Sister reports he's scheduled for another sleep study to see if he requires supplemental oxygen at night (gets SOB more easily, but she thinks that may be related to his weight gain). He and sister have joined a gym to try and stay more active. Overall sister reports he's doing well and is pleased with his progress thus far  Vitals:   04/01/21 1110  BP: (!) 113/56  Pulse: (!) 101  Resp: 20  Temp: 97.8 F (36.6 C)  SpO2: 97%

## 2021-04-01 NOTE — Progress Notes (Signed)
Radiation Oncology         [phone] ________________________________  Name: [name] Branch MRN: [id number]  Date: 04/01/2021  DOB: [birth year]  Follow-Up Visit Note  CC: [name] Han, MD  [name] Pier, MD  Diagnosis and Prior Radiotherapy:       ICD-10-CM   1. 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HCC)  C06.9 TSH    2. Weight gain  R63.5 TSH     Cancer Staging 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HCC) Staging form: Oral Cavity, AJCC 8th Edition - Pathologic stage from 04/28/2020: Stage Unknown (pT2, pNX, cM0) - Signed by [name] Gibson, MD on 05/03/2020 Stage prefix: Initial diagnosis  Radiation Treatment Dates: 05/20/2020 through 06/16/2020 Site Technique Total Dose (Gy) Dose per Fx (Gy) Completed Fx Beam Energies  Neck: HN_palate_necknodes IMRT 51/51 2.55 20/20 6X   CHIEF COMPLAINT:  Here for follow-up and surveillance of palate cancer  Narrative:    [name] presents today for follow-up after completing radiation to his palate on 06/16/2020  Pain issues, if any: Patient denies (sister/caregiver reports occasionally giving Tylenol if his mood/affect seems off) Using a feeding tube?: N/A Weight changes, if any:  Wt Readings from Last 3 Encounters:  04/01/21 271 lb 3.2 oz (123 kg)  02/18/21 250 lb (113.4 kg)  02/09/21 262 lb (118.8 kg)   Swallowing issues, if any: Overall, sister denies (states he has a healthy appetite and can eat/drink a wide variety). Does note that if he drinks to fast, he often coughs to clear throat, so she's frequently tells him to slow down Smoking or chewing tobacco? None Using fluoride trays daily? N/A--sister reports he sees a dentist about every 2 weeks.  Last ENT visit was on: 02/07/2021 saw oral surgeon Dr. Lennox Branch ([place])  Plan:   No new clinical evidence of locoregional recurrence   Nasopharyngoscopy showing no concerning right nasal discharge or purulence  Continue oral hygiene and routine dental care  Will see  Dr. Isidore Branch in September 2022  Previously used Mucinex D for nasal congestion with good results; I encouraged that this could be used again in the future.  Follow up in 5-6 months (~3 months after visit with Dr. Isidore Branch) for continued cancer surveillance.  Other notable issues, if any: Scheduled for colonoscopy on 04/11/2021 for colon cancer screening. Sister reports he's scheduled for another sleep study to see if he requires supplemental oxygen at night (gets SOB more easily, but she thinks that may be related to his weight gain). He and sister have joined a gym to try and stay more active. Overall sister reports he's doing well and is pleased with his progress thus far  Vitals:   04/01/21 1110  BP: (!) 113/56  Pulse: (!) 101  Resp: 20  Temp: 97.8 F (36.6 C)  SpO2: 97%    ALLERGIES:  is allergic to hydrocodone.  Meds: Current Outpatient Medications  Medication Sig Dispense Refill   Travoprost, BAK Free, (TRAVATAN) 0.004 % SOLN ophthalmic solution Apply 1 drop to eye at bedtime.     carbamide peroxide (DEBROX) 6.5 % OTIC solution 5 drops 2 (two) times daily.     cetirizine (ZYRTEC) 10 MG tablet Take 10 mg by mouth daily.     clozapine (CLOZARIL) 50 MG tablet Take 50 mg by mouth at bedtime.     dapagliflozin propanediol (FARXIGA) 10 MG TABS tablet Take 10 mg by mouth every morning.     Dextromethorphan-guaiFENesin (MUCINEX DM) 30-600 MG TB12 Take 1 tablet by mouth 2 (two)  times daily.     fluticasone (FLONASE) 50 MCG/ACT nasal spray Place into both nostrils.     fluvoxaMINE (LUVOX) 100 MG tablet Take 300 mg by mouth at bedtime.     ketoconazole (NIZORAL) 2 % cream Apply topically daily.     latanoprost (XALATAN) 0.005 % ophthalmic solution Place 1 drop into both eyes nightly.     metoprolol succinate (TOPROL-XL) 50 MG 24 hr tablet Take 1 tablet (50 mg total) by mouth in the morning. Hold if top blood pressure number less than 100 mmHg or heart rate less than 60 bpm (pulse). 90 tablet  0   MUCUS RELIEF DM COUGH 20-400 MG TABS Take 10 mg by mouth in the morning and at bedtime.     Multiple Vitamin (MULTIVITAMIN) capsule Take 1 capsule by mouth daily. 90 capsule 3   pravastatin (PRAVACHOL) 40 MG tablet TAKE ONE TABLET EACH DAY 90 tablet 3   sacubitril-valsartan (ENTRESTO) 49-51 MG Take 1 tablet by mouth 2 (two) times daily. 60 tablet 1   sodium fluoride (PREVIDENT 5000 PLUS) 1.1 % CREA dental cream Use a "grape-size" amount on a soft toothbrush every night.  Do not rinse with water or eat for at least 5 minutes after brushing teeth. 51 g 12   No current facility-administered medications for this encounter.    Physical Findings: The patient is in no acute distress. Patient is alert and oriented. Wt Readings from Last 3 Encounters:  04/01/21 271 lb 3.2 oz (123 kg)  02/18/21 250 lb (113.4 kg)  02/09/21 262 lb (118.8 kg)    height is '6\' 2"'$  (1.88 m) and weight is 271 lb 3.2 oz (123 kg). His temperature is 97.8 F (36.6 C). His blood pressure is 113/56 (abnormal) and his pulse is 101 (abnormal). His respiration is 20 and oxygen saturation is 97%. .  General: Alert, in no acute distress HEENT: Mild exophthalmos and significant erythema of the eyes bilaterally  Head is normocephalic; oral cavity /oropharynx notable for no tumor. No thrush.  No trismus. Neck: Neck is notable for no palpable masses. Skin: Skin in treatment fields shows satisfactory healing    MSK: Ambulatory independently Heart: Regular rate and rhythm with no murmurs Chest clear to auscultation bilaterally Abd: soft TD/ND  Lab Findings: Lab Results  Component Value Date   WBC 4.5 09/25/2020   HGB 13.8 09/25/2020   HCT 43.3 09/25/2020   MCV 95.8 09/25/2020   PLT 145 (L) 09/25/2020    Lab Results  Component Value Date   TSH 1.670 12/31/2017   CMP     Component Value Date/Time   NA 143 10/19/2020 1509   K 4.5 10/19/2020 1509   CL 102 10/19/2020 1509   CO2 24 10/19/2020 1509   GLUCOSE 97  10/19/2020 1509   GLUCOSE 90 09/25/2020 1746   BUN 10 10/19/2020 1509   CREATININE 1.06 10/19/2020 1509   CREATININE 1.01 09/02/2012 1022   CALCIUM 9.1 10/19/2020 1509   PROT 7.8 09/25/2020 1746   PROT 6.8 11/23/2017 1108   ALBUMIN 4.2 09/25/2020 1746   ALBUMIN 4.3 11/23/2017 1108   AST 42 (H) 09/25/2020 1746   ALT 63 (H) 09/25/2020 1746   ALKPHOS 109 09/25/2020 1746   BILITOT 0.7 09/25/2020 1746   BILITOT 0.6 11/23/2017 1108   GFRNONAA >60 09/25/2020 1746   GFRNONAA 89 09/02/2012 1022   GFRAA 78 09/13/2020 1524   GFRAA >89 09/02/2012 1022    Radiographic Findings: SLEEP STUDY DOCUMENTS  Result Date: 03/02/2021 Ordered  by an unspecified provider.    Impression/Plan:    Head and neck cancer status: NED per physical exam   He has ongoing issues with his eyes.  His sister will call the patient's eye doctor to see if there is anything that can be done about his significant erythema today.  TSH ordered today (weight gain) - WNL Lab Results  Component Value Date   TSH 0.855 04/01/2021   TSH 1.670 12/31/2017   TSH 0.956 09/04/2011   Follow-up plans : Continue to follow with ENT, I look forward to seeing him back in April   On date of service, in total, I spent 30 minutes on this encounter. Patient was seen in person. _____________________________________   [name] Gibson, MD

## 2021-04-06 ENCOUNTER — Telehealth: Payer: Self-pay

## 2021-04-06 NOTE — Telephone Encounter (Signed)
Have the original provider fill the [place].  You can update the dose of Entresto as to what he is taking.   ST

## 2021-04-07 ENCOUNTER — Telehealth: Payer: Self-pay

## 2021-04-07 NOTE — Telephone Encounter (Signed)
-----   Message from Mauri Pole, MD sent at 04/07/2021  4:30 PM EDT ----- Regarding: RE: [place] pt Beth, can you please schedule procedure at [facility] with next available provider.  Thanks VN ----- Message ----- From: Osvaldo Angst, CRNA Sent: 04/06/2021   4:55 PM EDT To: Mauri Pole, MD Subject: [facility] pt                                         Dr. Silverio Decamp,  This pt is scheduled with you on 9/12.  On 03/12/20 he required a video laryngoscope for intubation.  Consequently, his procedure will have to be re-scheduled for the hospital.  Thanks,  Osvaldo Angst

## 2021-04-07 NOTE — Telephone Encounter (Signed)
Spoke with Ms [name] Branch and advised of the situation and cancellation. Will find a time at the hospital and contact her with that date. Advised it may be more than a month before there is an opening, but we will try to find something asap. She states she understands and that the patient is okay right now.

## 2021-04-11 ENCOUNTER — Other Ambulatory Visit: Payer: Medicare Other | Admitting: Gastroenterology

## 2021-04-13 ENCOUNTER — Other Ambulatory Visit: Payer: Self-pay

## 2021-04-13 DIAGNOSIS — Z1211 Encounter for screening for malignant neoplasm of colon: Secondary | ICD-10-CM

## 2021-04-13 DIAGNOSIS — Z1212 Encounter for screening for malignant neoplasm of rectum: Secondary | ICD-10-CM

## 2021-04-13 NOTE — Telephone Encounter (Signed)
Called to [name]. No answer. Left her a voicemail about possible procedure dates and asked she call back asap. Offering 05/31/21 with Dr Fuller Plan or 12/27 or 12/28 with Dr Sunday Spillers.

## 2021-04-13 NOTE — Telephone Encounter (Signed)
Letter with procedure and Pre-visit date mailed. Will change these if they are not good for patient.

## 2021-05-10 ENCOUNTER — Telehealth: Payer: Self-pay | Admitting: Cardiology

## 2021-05-11 ENCOUNTER — Other Ambulatory Visit: Payer: Self-pay

## 2021-05-11 MED ORDER — ENTRESTO 49-51 MG PO TABS: 0.5000 | ORAL_TABLET | Freq: Two times a day (BID) | ORAL | 2 refills | Status: DC

## 2021-05-12 ENCOUNTER — Other Ambulatory Visit: Payer: Self-pay

## 2021-05-12 ENCOUNTER — Encounter (HOSPITAL_BASED_OUTPATIENT_CLINIC_OR_DEPARTMENT_OTHER): Payer: Self-pay

## 2021-05-12 ENCOUNTER — Emergency Department (HOSPITAL_BASED_OUTPATIENT_CLINIC_OR_DEPARTMENT_OTHER)
Admission: EM | Admit: 2021-05-12 | Discharge: 2021-05-12 | Disposition: A | Discharge status: Home / Self Care | Payer: Medicare Other | Attending: Emergency Medicine | Admitting: Emergency Medicine

## 2021-05-12 DIAGNOSIS — R195 Other fecal abnormalities: Secondary | ICD-10-CM

## 2021-05-12 DIAGNOSIS — R197 Diarrhea, unspecified: Secondary | ICD-10-CM | POA: Insufficient documentation

## 2021-05-12 DIAGNOSIS — I5022 Chronic systolic (congestive) heart failure: Secondary | ICD-10-CM | POA: Insufficient documentation

## 2021-05-12 LAB — URINALYSIS, ROUTINE W REFLEX MICROSCOPIC
Bilirubin Urine: NEGATIVE
Glucose, UA: >1000 mg/dL — AB
Hgb urine dipstick: NEGATIVE
Ketones, ur: NEGATIVE mg/dL
Leukocytes,Ua: NEGATIVE
Nitrite: NEGATIVE
Specific Gravity, Urine: 1.014 (ref 1.005–1.030)
pH: 6 (ref 5.0–8.0)

## 2021-05-12 LAB — CBC WITH DIFFERENTIAL/PLATELET
Abs Immature Granulocytes: 0.01 10*3/uL (ref 0.00–0.07)
Basophils Absolute: 0 10*3/uL (ref 0.0–0.1)
Basophils Relative: 0 %
Eosinophils Absolute: 0.1 10*3/uL (ref 0.0–0.5)
Eosinophils Relative: 3 %
HCT: 45.9 % (ref 39.0–52.0)
Hemoglobin: 14.8 g/dL (ref 13.0–17.0)
Immature Granulocytes: 0 %
Lymphocytes Relative: 23 %
Lymphs Abs: 1.3 10*3/uL (ref 0.7–4.0)
MCH: 31.2 pg (ref 26.0–34.0)
MCHC: 32.2 g/dL (ref 30.0–36.0)
MCV: 96.8 fL (ref 80.0–100.0)
Monocytes Absolute: 0.6 10*3/uL (ref 0.1–1.0)
Monocytes Relative: 10 %
Neutro Abs: 3.6 10*3/uL (ref 1.7–7.7)
Neutrophils Relative %: 64 %
Platelets: 159 10*3/uL (ref 150–400)
RBC: 4.74 MIL/uL (ref 4.22–5.81)
RDW: 14.2 % (ref 11.5–15.5)
WBC: 5.6 10*3/uL (ref 4.0–10.5)
nRBC: 0 % (ref 0.0–0.2)

## 2021-05-12 LAB — BASIC METABOLIC PANEL
Anion gap: 7 (ref 5–15)
BUN: 9 mg/dL (ref 6–20)
CO2: 28 mmol/L (ref 22–32)
Calcium: 9.2 mg/dL (ref 8.9–10.3)
Chloride: 105 mmol/L (ref 98–111)
Creatinine, Ser: 1.12 mg/dL (ref 0.61–1.24)
GFR, Estimated: >60 mL/min (ref >60)
Glucose, Bld: 91 mg/dL (ref 70–99)
Potassium: 4.1 mmol/L (ref 3.5–5.1)
Sodium: 140 mmol/L (ref 135–145)

## 2021-05-12 NOTE — ED Provider Notes (Signed)
[facility] EMERGENCY DEPT Provider Note   CSN: [id number] Arrival date & time: 05/12/21  1256     History Chief Complaint  Patient presents with   Diarrhea    [name] Branch is a 55 y.o. male.  Presents to ER with concern for diarrhea.  Patient's sister and caregiver at bedside.  She provides most of history.  Over the past week or so patient has had intermittent issues with loose stools.  Still never more than a couple bowel movements daily.  A few days ago had an episode of stool that looked a little bit more dark than normal however did not have any blood and was not black or tarry.  Has had brown stool ever since.  Patient denied any abdominal pain.  Caregiver states patient has not complained of any abdominal pain and has not had any nausea or vomiting.  May have seemed a little bit more fatigued today but otherwise feels well at present. 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  Qualifier: Diagnosis of  By: Garnette Scheuermann MD, Yogesh     Obstruction of colon ([place])    recurrent   Sarcoidosis    Schizophrenia ([place])    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   Total self-care deficit    per caregiver he needs to have help bathing and cleaning self and needs help with all daily needs now     Patient Active Problem List   Diagnosis Date Noted   Encounter for colorectal cancer screening 02/09/2021   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[place]) 05/03/2020   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[place]) 04/22/2020   Incontinent of feces    Total self-care deficit    S/P total colectomy 07/20/2017    Dizziness 10/12/2016   Anemia of chronic disease 10/05/2014   Colonic inertia s/p abdominal colectomy 09/25/2014 09/25/2014   Hyperlipidemia 09/17/2008   Glaucoma 10/10/2006   Sarcoidosis 06/11/2006   Schizophrenia, unspecified type ([place]) 06/11/2006   Intellectual disability 80/99/8338   Chronic systolic CHF (congestive heart failure) ([place]) 06/11/2006   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    Past Surgical History:  Procedure Laterality Date   COLECTOMY N/A 09/25/2014   Procedure: OPEN ABDOMINAL COLECTOMY;  Surgeon: Michael Boston, MD;  Location: [facility];  Service: General;  Laterality: N/A;   COLONOSCOPY  2007   groin surgery     boil drained   MOUTH SURGERY     PROCTOSCOPY N/A 09/25/2014   Procedure: RIGID PROCTOSCOPY;  Surgeon: Michael Boston, MD;  Location: [facility];  Service: General;  Laterality: N/A;       Family History  Problem Relation Age of Onset   Diabetes Mother    Colon cancer Neg Hx    Colon polyps Neg Hx    Esophageal cancer Neg Hx    Rectal cancer Neg Hx    Stomach cancer Neg Hx     Social History   Tobacco Use   Smoking status: Never   Smokeless tobacco: Never  Vaping Use   Vaping Use:  Never used  Substance Use Topics   Alcohol use: No    Alcohol/week: 0.0 standard drinks   Drug use: No    Home Medications Prior to Admission medications   Medication Sig Start Date End Date Taking? Authorizing Provider  carboxymethylcellulose (REFRESH PLUS) 0.5 % SOLN 1 drop 3 (three) times daily as needed.   Yes [provider]  cetirizine (ZYRTEC) 10 MG tablet Take 10 mg by mouth daily. 08/30/20  Yes [provider]  clozapine (CLOZARIL) 50 MG tablet Take 50 mg by mouth at bedtime.   Yes [provider]  fluticasone (FLONASE) 50 MCG/ACT nasal spray Place into both nostrils. 12/20/20  Yes [provider]  fluvoxaMINE (LUVOX) 100 MG tablet Take 300 mg by mouth at bedtime. 11/30/20  Yes [provider]  latanoprost (XALATAN) 0.005 %  ophthalmic solution Place 1 drop into both eyes nightly. 10/08/20  Yes [provider]  metoprolol succinate (TOPROL-XL) 50 MG 24 hr tablet Take 1 tablet (50 mg total) by mouth in the morning. Hold if top blood pressure number less than 100 mmHg or heart rate less than 60 bpm (pulse). 02/01/21 05/12/21 Yes Tolia, Sunit, DO  Multiple Vitamin (MULTIVITAMIN) capsule Take 1 capsule by mouth daily. 03/25/18  Yes Ladell Pier, MD  pravastatin (PRAVACHOL) 40 MG tablet TAKE ONE TABLET EACH DAY 03/25/18  Yes Ladell Pier, MD  sacubitril-valsartan (ENTRESTO) 49-51 MG Take 0.5 tablets by mouth 2 (two) times daily. 05/11/21  Yes Tolia, Sunit, DO  sodium chloride (OCEAN) 0.65 % SOLN nasal spray Place 1 spray into both nostrils as needed for congestion.   Yes [provider]  sodium fluoride (PREVIDENT 5000 PLUS) 1.1 % CREA dental cream Use a "grape-size" amount on a soft toothbrush every night.  Do not rinse with water or eat for at least 5 minutes after brushing teeth. 08/11/20 08/11/21 Yes Owsley, Madison B, DMD  Travoprost, BAK Free, (TRAVATAN) 0.004 % SOLN ophthalmic solution Apply 1 drop to eye at bedtime. 06/02/19  Yes [provider]  carbamide peroxide (DEBROX) 6.5 % OTIC solution 5 drops 2 (two) times daily.    [provider]  Dextromethorphan-guaiFENesin (MUCINEX DM) 30-600 MG TB12 Take 1 tablet by mouth 2 (two) times daily. 08/31/20   [provider]  ketoconazole (NIZORAL) 2 % cream Apply topically daily.    [provider]  MUCUS RELIEF DM COUGH 20-400 MG TABS Take 10 mg by mouth in the morning and at bedtime. 08/31/20   [provider]  spironolactone (ALDACTONE) 25 MG tablet Take 0.5 tablets (12.5 mg total) by mouth in the morning. 09/02/20 09/25/20  Rex Kras, DO    Allergies    Hydrocodone  Review of Systems   Review of Systems  Constitutional:  Negative for chills and fever.  HENT:  Negative for ear pain and sore throat.    Eyes:  Negative for pain and visual disturbance.  Respiratory:  Negative for cough and shortness of breath.   Cardiovascular:  Negative for chest pain and palpitations.  Gastrointestinal:  Positive for diarrhea. Negative for abdominal pain and vomiting.  Genitourinary:  Negative for dysuria and hematuria.  Musculoskeletal:  Negative for arthralgias and back pain.  Skin:  Negative for color change and rash.  Neurological:  Negative for seizures and syncope.  All other systems reviewed and are negative.  Physical Exam Updated Vital Signs BP 138/87   Pulse 78   Temp 98 F (36.7 C)   Resp 18  SpO2 98%   Physical Exam Vitals and nursing note reviewed.  Constitutional:      Appearance: He is well-developed.  HENT:     Head: Normocephalic and atraumatic.  Eyes:     Conjunctiva/sclera: Conjunctivae normal.  Cardiovascular:     Rate and Rhythm: Normal rate and regular rhythm.     Heart sounds: No murmur heard. Pulmonary:     Effort: Pulmonary effort is normal. No respiratory distress.     Breath sounds: Normal breath sounds.  Abdominal:     Palpations: Abdomen is soft.     Tenderness: There is no abdominal tenderness.  Musculoskeletal:        General: No deformity or signs of injury.     Cervical back: Neck supple.  Skin:    General: Skin is warm and dry.  Neurological:     General: No focal deficit present.     Mental Status: He is alert.  Psychiatric:        Mood and Affect: Mood normal.        Behavior: Behavior normal.    ED Results / Procedures / Treatments   Labs (all labs ordered are listed, but only abnormal results are displayed) Labs Reviewed  URINALYSIS, ROUTINE W REFLEX MICROSCOPIC - Abnormal; Notable for the following components:      Result Value   Glucose, UA >1,000 (*)    Protein, ur TRACE (*)    All other components within normal limits  BASIC METABOLIC PANEL  CBC WITH DIFFERENTIAL/PLATELET  CBC WITH DIFFERENTIAL/PLATELET     EKG None  Radiology No results found.  Procedures Ultrasound ED Peripheral IV (Provider)  Date/Time: 05/13/2021 8:13 PM Performed by: Lucrezia Starch, MD Authorized by: Lucrezia Starch, MD   Procedure details:    Indications: multiple failed IV attempts and poor IV access     Skin Prep: chlorhexidine gluconate     Location:  Left AC   Angiocath:  20 G   Bedside Ultrasound Guided: Yes     Images: not archived     Patient tolerated procedure without complications: Yes     Dressing applied: Yes     Medications Ordered in ED Medications - No data to display  ED Course  I have reviewed the triage vital signs and the nursing notes.  Pertinent labs & imaging results that were available during my care of the patient were reviewed by me and considered in my medical decision making (see chart for details).    MDM Rules/Calculators/A&P                           55 year old gentleman presents to ER with concern for diarrhea.  Having occasional loose stools over the past week or so.  Caregiver had reported 1 episode a few days ago was slightly darker than normal but otherwise brown.  Patient appears well in no distress with normal vital signs and soft abdomen.  CBC normal without anemia.  BMP normal without electrolyte derangement, no elevation in BUN or creatinine.  Given patient's remarkable well appearance and his basic lab work is normal, believe he can be discharged and managed in the outpatient setting.  Recommend that he follow-up with his gastroenterologist and primary care doctor to discuss symptoms further.  Discharged home with sister/caregiver.     After the discussed management above, the patient was determined to be safe for discharge.  The patient was in agreement with this plan and all  questions regarding their care were answered.  ED return precautions were discussed and the patient will return to the ED with any significant worsening of condition.  Final  Clinical Impression(s) / ED Diagnoses Final diagnoses:  Loose stools    Rx / DC Orders ED Discharge Orders     None        Lucrezia Starch, MD 05/13/21 2013

## 2021-05-12 NOTE — Discharge Instructions (Addendum)
Please follow-up with your primary doctor and with your gastroenterologist.  If you develop blood in stool, abdominal pain, vomiting, or other new concerning symptom, come back to ER for reassessment.

## 2021-05-12 NOTE — ED Notes (Signed)
Unable to obtain iv access on patient, 3 RN's tried.

## 2021-05-12 NOTE — ED Notes (Signed)
Pt dc home with caregiver, voiced understanding of dc instructions

## 2021-05-12 NOTE — ED Triage Notes (Signed)
Pts wife reports diarrhea since last week that has become progressively worse and foul smelling. Denies black and tarry and blood stools. Pt has increased thirst. Denies abdominal pain and fever. Wife reports hx of colon removal.

## 2021-05-31 ENCOUNTER — Other Ambulatory Visit: Payer: Self-pay

## 2021-05-31 DIAGNOSIS — E785 Hyperlipidemia, unspecified: Secondary | ICD-10-CM

## 2021-05-31 MED ORDER — PRAVASTATIN SODIUM 40 MG PO TABS: ORAL_TABLET | ORAL | 3 refills | Status: AC

## 2021-06-02 ENCOUNTER — Ambulatory Visit (INDEPENDENT_AMBULATORY_CARE_PROVIDER_SITE_OTHER): Payer: Medicare Other | Admitting: Physician Assistant

## 2021-06-02 ENCOUNTER — Encounter: Payer: Self-pay | Admitting: Physician Assistant

## 2021-06-02 VITALS — BP 110/76 | HR 84 | Ht 74.0 in | Wt 275.0 lb

## 2021-06-02 DIAGNOSIS — R197 Diarrhea, unspecified: Secondary | ICD-10-CM

## 2021-06-02 NOTE — Progress Notes (Signed)
Chief Complaint: Follow-up ER visit for diarrhea  HPI:    Mr. 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 Silverio Decamp, who presents clinic today for follow-up after being seen in the ER for diarrhea.    02/09/2021 patient seen in clinic with his sister who is also his caregiver to discuss flex sigmoidoscopy for rectal cancer screening.  His last flex sig was in September 2019 at which time there were suboptimal due to poor prep with a large amount of stool in the rectum.  There is erythema and friable mucosa at the ileocolonic anastomosis and biopsy showed mild focal active colitis.  At that time plans were for tentative flex sig with Dr. Silverio Decamp with a full bowel prep.    04/07/2021 Osvaldo Angst reviewed chart and discussed that he required a video laryngoscope for intubation on 03/12/2020 is recommended he schedule his procedure in the hospital.  He was scheduled for 07/26/2021.    05/12/2021 patient seen in the ED with concern for diarrhea.  At that point had had intermittent issues with loose stools over the past week.  At that time normal CBC and BMP.    Today, the patient presents to clinic accompanied by his caregiver who is also his sister.  She explains that about a month ago he had a "darker than normal" looking stool that maybe "smelled like blood", the next day it was very "brown" and smelled "feverish".  She tells me that with his previous caregiver he had problems with dehydration and actually eventually lost his colon due to problems with bowel movements.  She explains that when he does have days of looser stool she makes sure he is drinking lots of fluids but at this point wanted to make sure he was doing okay so she took him  to the ER.  Explains that since then he has only had one looser stool than normal per week.  Tells me these have looked normal.  Denies any complaints of abdominal pain.  She tells me he eats lots of fiber and in general has fairly normal stools.    Denies fever, chills, weight loss, nausea or vomiting.  Past Medical History:  Diagnosis Date   ABSCESS, ANAL/RECTAL REGIONS 08/18/2006   Annotation: Fournier gangrene Qualifier: History of  By: Garnette Scheuermann MD, Yogesh     Cardiomyopathy    CHF (congestive heart failure) ([place])    Colonic inertia    Constipation    Glaucoma    Hyperlipidemia    Hypovolemic shock ([place])    Incontinent of feces    Mental retardation    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   Annotation: chronic with stercoral ulcer  Qualifier: Diagnosis of  By: Garnette Scheuermann MD, Yogesh     Obstruction of colon ([place])    recurrent   Sarcoidosis    Schizophrenia ([place])    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   Total self-care deficit    per caregiver he needs to have help bathing and cleaning self and needs help with all daily needs now     Past Surgical History:  Procedure Laterality Date   COLECTOMY N/A 09/25/2014   Procedure: OPEN ABDOMINAL COLECTOMY;  Surgeon: Michael Boston, MD;  Location: [facility];  Service: General;  Laterality: N/A;   COLONOSCOPY  2007  groin surgery     boil drained   MOUTH SURGERY     PROCTOSCOPY N/A 09/25/2014   Procedure: RIGID PROCTOSCOPY;  Surgeon: Michael Boston, MD;  Location: [facility];  Service: General;  Laterality: N/A;    Current Outpatient Medications  Medication Sig Dispense Refill   carbamide peroxide (DEBROX) 6.5 % OTIC solution 5 drops 2 (two) times daily.     carboxymethylcellulose (REFRESH PLUS) 0.5 % SOLN 1 drop 3 (three) times daily as needed.     cetirizine (ZYRTEC) 10 MG tablet Take 10 mg by mouth daily.     clozapine (CLOZARIL) 50 MG tablet Take 50 mg by mouth at bedtime.     Dextromethorphan-guaiFENesin (MUCINEX DM) 30-600 MG TB12 Take 1 tablet by mouth 2  (two) times daily.     fluticasone (FLONASE) 50 MCG/ACT nasal spray Place into both nostrils.     fluvoxaMINE (LUVOX) 100 MG tablet Take 300 mg by mouth at bedtime.     ketoconazole (NIZORAL) 2 % cream Apply topically daily.     latanoprost (XALATAN) 0.005 % ophthalmic solution Place 1 drop into both eyes nightly.     metoprolol succinate (TOPROL-XL) 50 MG 24 hr tablet Take 1 tablet (50 mg total) by mouth in the morning. Hold if top blood pressure number less than 100 mmHg or heart rate less than 60 bpm (pulse). 90 tablet 0   MUCUS RELIEF DM COUGH 20-400 MG TABS Take 10 mg by mouth in the morning and at bedtime.     Multiple Vitamin (MULTIVITAMIN) capsule Take 1 capsule by mouth daily. 90 capsule 3   pravastatin (PRAVACHOL) 40 MG tablet TAKE ONE TABLET EACH DAY 90 tablet 3   sacubitril-valsartan (ENTRESTO) 49-51 MG Take 0.5 tablets by mouth 2 (two) times daily. 60 tablet 2   sodium chloride (OCEAN) 0.65 % SOLN nasal spray Place 1 spray into both nostrils as needed for congestion.     sodium fluoride (PREVIDENT 5000 PLUS) 1.1 % CREA dental cream Use a "grape-size" amount on a soft toothbrush every night.  Do not rinse with water or eat for at least 5 minutes after brushing teeth. 51 g 12   Travoprost, BAK Free, (TRAVATAN) 0.004 % SOLN ophthalmic solution Apply 1 drop to eye at bedtime.     No current facility-administered medications for this visit.    Allergies as of 06/02/2021 - Review Complete 05/12/2021  Allergen Reaction Noted   Hydrocodone Other (See Comments) 03/10/2020    Family History  Problem Relation Age of Onset   Diabetes Mother    Colon cancer Neg Hx    Colon polyps Neg Hx    Esophageal cancer Neg Hx    Rectal cancer Neg Hx    Stomach cancer Neg Hx     Social History   Socioeconomic History   Marital status: Single    Spouse name: Not on file   Number of children: 0   Years of education: Not on file   Highest education level: Not on file  Occupational History     Employer: DISABLED  Tobacco Use   Smoking status: Never   Smokeless tobacco: Never  Vaping Use   Vaping Use: Never used  Substance and Sexual Activity   Alcohol use: No    Alcohol/week: 0.0 standard drinks   Drug use: No   Sexual activity: Not on file    Comment:    Other Topics Concern   Not on file  Social History Narrative   Has a caretaker.  [name] (sister) HPOA and Legal Guardian   Social Determinants of Health   Financial Resource Strain: Not on file  Food Insecurity: Not on file  Transportation Needs: Not on file  Physical Activity: Not on file  Stress: Not on file  Social Connections: Not on file  Intimate Partner Violence: Not on file    Review of Systems:    Constitutional: No weight loss, fever or chills Cardiovascular: No chest pain Respiratory: No SOB  Gastrointestinal: See HPI and otherwise negative   Physical Exam:  Vital signs: BP 110/76   Pulse 84   Ht 6\' 2"  (1.88 m)   Wt 275 lb (124.7 kg)   SpO2 95%   BMI 35.31 kg/m    Constitutional:   Pleasant mentally retarded AA male appears to be in NAD, Well developed, Well nourished, alert and cooperative Respiratory: Respirations even and unlabored. Lungs clear to auscultation bilaterally.   No wheezes, crackles, or rhonchi.  Cardiovascular: Normal S1, S2. No MRG. Regular rate and rhythm. No peripheral edema, cyanosis or pallor.  Gastrointestinal:  Soft, nondistended, nontender. No rebound or guarding. Normal bowel sounds. No appreciable masses or hepatomegaly. Rectal:  Not performed.  Psychiatric: Oriented to person.Demonstrates good judgement and reason without abnormal affect or behaviors.  RELEVANT LABS AND IMAGING: CBC    Component Value Date/Time   WBC 5.6 05/12/2021 1820   RBC 4.74 05/12/2021 1820   HGB 14.8 05/12/2021 1820   HGB 13.6 11/23/2017 1108   HCT 45.9 05/12/2021 1820   HCT 42.7 11/23/2017 1108   PLT 159 05/12/2021 1820   PLT 171 11/23/2017 1108   MCV 96.8 05/12/2021 1820   MCV  92 11/23/2017 1108   MCH 31.2 05/12/2021 1820   MCHC 32.2 05/12/2021 1820   RDW 14.2 05/12/2021 1820   RDW 14.4 11/23/2017 1108   LYMPHSABS 1.3 05/12/2021 1820   MONOABS 0.6 05/12/2021 1820   EOSABS 0.1 05/12/2021 1820   BASOSABS 0.0 05/12/2021 1820    CMP     Component Value Date/Time   NA 140 05/12/2021 1620   NA 143 10/19/2020 1509   K 4.1 05/12/2021 1620   CL 105 05/12/2021 1620   CO2 28 05/12/2021 1620   GLUCOSE 91 05/12/2021 1620   BUN 9 05/12/2021 1620   BUN 10 10/19/2020 1509   CREATININE 1.12 05/12/2021 1620   CREATININE 1.01 09/02/2012 1022   CALCIUM 9.2 05/12/2021 1620   PROT 7.8 09/25/2020 1746   PROT 6.8 11/23/2017 1108   ALBUMIN 4.2 09/25/2020 1746   ALBUMIN 4.3 11/23/2017 1108   AST 42 (H) 09/25/2020 1746   ALT 63 (H) 09/25/2020 1746   ALKPHOS 109 09/25/2020 1746   BILITOT 0.7 09/25/2020 1746   BILITOT 0.6 11/23/2017 1108   GFRNONAA >60 05/12/2021 1620   GFRNONAA 89 09/02/2012 1022   GFRAA 78 09/13/2020 1524   GFRAA >89 09/02/2012 1022    Assessment: 1.  Occasional diarrhea: Patient already scheduled for flex sig at the hospital on 12/27 given history of incomplete flex sig for colon cancer screening purposes, caregiver/sister reports 1 bowel movement a week which is looser than normal and one darker than normal stool recently, seen in the ER with normal CBC, patient is status post colectomy; suspect this is due to his anatomy +/- diet at time  Plan: 1.  Discussed with his sister that him having an occasional loose stool is not abnormal as long as this is not multiple times a day.  In fact, having 1 loose stool  cannot make you dehydrated, though I encouraged her to continue what she is doing as far as making sure he is drinking enough fluids.  The fact that he no longer has a colon will lead to an increased amount of loose stools typically. 2.  Encouraged caregiver that she is doing a very good job taking care of him and she did the right thing by bringing  him to the ER for labs when she was concerned, which were perfectly normal, which means that likely what she smelled/saw was not blood. 3.  Encouraged a high-fiber diet 4.  Patient to follow in clinic as needed in the future.  Ellouise Newer, PA-C [facility] 06/02/2021, 3:23 PM  Cc: [name]*

## 2021-06-03 ENCOUNTER — Telehealth: Payer: Self-pay | Admitting: Physician Assistant

## 2021-06-03 NOTE — Telephone Encounter (Signed)
Pt's guardian [name] stopped by at the office to drop off a form for [name] Branch to fill out. Form given to CMA. Pls call [name] when it is ready for pick up.

## 2021-06-28 ENCOUNTER — Encounter: Payer: Self-pay | Admitting: Podiatry

## 2021-06-28 ENCOUNTER — Encounter (INDEPENDENT_AMBULATORY_CARE_PROVIDER_SITE_OTHER): Payer: Medicare Other | Admitting: Podiatry

## 2021-06-28 NOTE — Progress Notes (Signed)
This encounter was created in error - please disregard.

## 2021-07-04 ENCOUNTER — Ambulatory Visit: Payer: Medicare Other | Admitting: Cardiology

## 2021-07-05 ENCOUNTER — Other Ambulatory Visit: Payer: Self-pay

## 2021-07-05 DIAGNOSIS — I5042 Chronic combined systolic (congestive) and diastolic (congestive) heart failure: Secondary | ICD-10-CM

## 2021-07-05 MED ORDER — METOPROLOL SUCCINATE ER 50 MG PO TB24: 50.0000 mg | ORAL_TABLET | Freq: Every morning | ORAL | 1 refills | Status: DC

## 2021-07-07 NOTE — Addendum Note (Signed)
Addended by: Gardiner Barefoot on: 07/07/2021 12:01 PM   Modules accepted: Level of Service

## 2021-07-15 ENCOUNTER — Ambulatory Visit (AMBULATORY_SURGERY_CENTER): Payer: Self-pay

## 2021-07-15 ENCOUNTER — Other Ambulatory Visit: Payer: Self-pay

## 2021-07-15 VITALS — Ht 74.0 in | Wt 273.0 lb

## 2021-07-15 DIAGNOSIS — R197 Diarrhea, unspecified: Secondary | ICD-10-CM

## 2021-07-15 MED ORDER — PLENVU 140 G PO SOLR: 1.0000 | Freq: Once | ORAL | 0 refills | Status: AC

## 2021-07-15 NOTE — Progress Notes (Signed)
Denies allergies to eggs or soy products. Denies complication of anesthesia or sedation. Denies use of weight loss medication. Denies use of O2.   Emmi instructions given for colonoscopy.  

## 2021-07-19 ENCOUNTER — Telehealth: Payer: Self-pay | Admitting: Physician Assistant

## 2021-07-19 NOTE — Telephone Encounter (Signed)
Pharmacy rep called to let us know the insurance will not cover Plenvu requesting an alternative.

## 2021-07-20 NOTE — Telephone Encounter (Signed)
Left message for [name] (LG) to call office. Will switch patient to Flex prep with Miralax.

## 2021-07-21 NOTE — Telephone Encounter (Signed)
Inbound call from patient wife. Have previous Plenvu prep from a procedure that she did not do and will use that for his prep.

## 2021-07-21 NOTE — Telephone Encounter (Signed)
Pharmacy called this morning about the request to change Plenvu to Golytely as his insurance will cover that.  Because he is long-term care, even if it's switched to the Miralax prep, they will need a prescription for him.  Please call Kayla at [phone] to let her know how to proceed with his prep as they close early tomorrow for Christmas and won't be back in until Tuesday.  They are just trying to get his prep to him before they close.  Thank you for all your help.

## 2021-07-21 NOTE — Telephone Encounter (Signed)
Spoke with [name] (wife/LG) and confirmed that she does have a Plenvu on hand that does not expire until 09/23. Called Kayla at [facility] and she will cancel the order for Plenvu since wife already has one at home.

## 2021-07-25 NOTE — Anesthesia Preprocedure Evaluation (Addendum)
Anesthesia Evaluation  Patient identified by MRN, date of birth, ID band Patient awake    Reviewed: Allergy & Precautions, H&P , NPO status , Patient's Chart, lab work & pertinent test results  Airway Mallampati: II  TM Distance: >3 FB Neck ROM: Full    Dental no notable dental hx. (+) Teeth Intact, Missing, Poor Dentition   Pulmonary neg pulmonary ROS, asthma ,  Sarcoidosis   Pulmonary exam normal breath sounds clear to auscultation       Cardiovascular Exercise Tolerance: Good +CHF  negative cardio ROS Normal cardiovascular exam Rhythm:Regular Rate:Normal  Echocardiogram 12/06/2020:  Left ventricle cavity is normal in size. Mild concentric hypertrophy of  the left ventricle. Normal global wall motion. Normal LV systolic function  with EF 50-55%. Doppler evidence of grade I (impaired) diastolic  dysfunction, normal LAP.  Structurally normal trileaflet aortic valve. Mild (Grade I) aortic  regurgitation.  Mild tricuspid regurgitation.  No evidence of pulmonary hypertension.   Neuro/Psych PSYCHIATRIC DISORDERS Schizophrenia negative neurological ROS  negative psych ROS   GI/Hepatic negative GI ROS, Neg liver ROS, GERD  ,  Endo/Other  negative endocrine ROS  Renal/GU negative Renal ROS  negative genitourinary   Musculoskeletal negative musculoskeletal ROS (+)   Abdominal   Peds negative pediatric ROS (+)  Hematology negative hematology ROS (+)   Anesthesia Other Findings Mental retardation  GAET  03/12/20; 7711 (created via procedure documentation); Blade size: 4; Video laryngoscopic view grade: 2; Airway size (mm): 7.5 (7.5 ETT was a tight fit, likely would be unable to place a larger diameter tube); Cuffed; Initial leak: Yes; Insertion attempts: 1; Insertion site: left naris; Placement verification: Bilateral Breath Sounds, ETCO2; Secured at 28 cm; Measured from Nare; Complex? No; Mask airway: Easy; Video-assisted  blade: Mac McGrath   Reproductive/Obstetrics negative OB ROS                           Anesthesia Physical Anesthesia Plan  ASA: 3  Anesthesia Plan:    Post-op Pain Management: Minimal or no pain anticipated   Induction: Intravenous  PONV Risk Score and Plan: 1  Airway Management Planned: Mask, Natural Airway and Nasal Cannula  Additional Equipment:   Intra-op Plan:   Post-operative Plan:   Informed Consent: I have reviewed the patients History and Physical, chart, labs and discussed the procedure including the risks, benefits and alternatives for the proposed anesthesia with the patient or authorized representative who has indicated his/her understanding and acceptance.       Plan Discussed with: Anesthesiologist  Anesthesia Plan Comments:         Anesthesia Quick Evaluation

## 2021-07-26 ENCOUNTER — Ambulatory Visit (HOSPITAL_COMMUNITY)
Admission: RE | Admit: 2021-07-26 | Discharge: 2021-07-26 | Disposition: A | Discharge status: Home / Self Care | Payer: Medicare Other | Attending: Gastroenterology | Admitting: Gastroenterology

## 2021-07-26 ENCOUNTER — Ambulatory Visit (HOSPITAL_COMMUNITY): Payer: Medicare Other | Admitting: Certified Registered Nurse Anesthetist

## 2021-07-26 ENCOUNTER — Encounter (HOSPITAL_COMMUNITY): Payer: Self-pay | Admitting: Gastroenterology

## 2021-07-26 ENCOUNTER — Encounter (HOSPITAL_COMMUNITY)
Admission: RE | Disposition: A | Discharge status: Home / Self Care | Payer: Self-pay | Source: Home / Self Care | Attending: Gastroenterology

## 2021-07-26 ENCOUNTER — Other Ambulatory Visit: Payer: Self-pay

## 2021-07-26 DIAGNOSIS — Z1212 Encounter for screening for malignant neoplasm of rectum: Secondary | ICD-10-CM | POA: Insufficient documentation

## 2021-07-26 DIAGNOSIS — R197 Diarrhea, unspecified: Secondary | ICD-10-CM | POA: Diagnosis present

## 2021-07-26 DIAGNOSIS — D869 Sarcoidosis, unspecified: Secondary | ICD-10-CM | POA: Diagnosis not present

## 2021-07-26 DIAGNOSIS — Z9049 Acquired absence of other specified parts of digestive tract: Secondary | ICD-10-CM | POA: Diagnosis not present

## 2021-07-26 DIAGNOSIS — R159 Full incontinence of feces: Secondary | ICD-10-CM | POA: Insufficient documentation

## 2021-07-26 DIAGNOSIS — Z1211 Encounter for screening for malignant neoplasm of colon: Secondary | ICD-10-CM | POA: Diagnosis not present

## 2021-07-26 DIAGNOSIS — I071 Rheumatic tricuspid insufficiency: Secondary | ICD-10-CM | POA: Diagnosis not present

## 2021-07-26 DIAGNOSIS — K56699 Other intestinal obstruction unspecified as to partial versus complete obstruction: Secondary | ICD-10-CM | POA: Diagnosis not present

## 2021-07-26 DIAGNOSIS — K529 Noninfective gastroenteritis and colitis, unspecified: Secondary | ICD-10-CM | POA: Diagnosis not present

## 2021-07-26 DIAGNOSIS — I509 Heart failure, unspecified: Secondary | ICD-10-CM | POA: Insufficient documentation

## 2021-07-26 DIAGNOSIS — K624 Stenosis of anus and rectum: Secondary | ICD-10-CM

## 2021-07-26 HISTORY — PX: FLEXIBLE SIGMOIDOSCOPY: SHX5431

## 2021-07-26 HISTORY — PX: BIOPSY: SHX5522

## 2021-07-26 HISTORY — PX: BALLOON DILATION: SHX5330

## 2021-07-26 LAB — GLUCOSE, CAPILLARY: Glucose-Capillary: 108 mg/dL — ABNORMAL HIGH (ref 70–99)

## 2021-07-26 SURGERY — SIGMOIDOSCOPY, FLEXIBLE
Anesthesia: Monitor Anesthesia Care

## 2021-07-26 MED ORDER — LACTATED RINGERS IV SOLN: INTRAVENOUS | Status: DC | PRN

## 2021-07-26 MED ORDER — PROPOFOL 10 MG/ML IV BOLUS
INTRAVENOUS | Status: AC
  Filled 2021-07-26: qty 20

## 2021-07-26 MED ORDER — SODIUM CHLORIDE 0.9 % IV SOLN: INTRAVENOUS | Status: DC

## 2021-07-26 MED ORDER — PHENYLEPHRINE 40 MCG/ML (10ML) SYRINGE FOR IV PUSH (FOR BLOOD PRESSURE SUPPORT)
PREFILLED_SYRINGE | INTRAVENOUS | Status: DC | PRN
  Administered 2021-07-26: 80 ug via INTRAVENOUS

## 2021-07-26 MED ORDER — LIDOCAINE 2% (20 MG/ML) 5 ML SYRINGE
INTRAMUSCULAR | Status: DC | PRN
  Administered 2021-07-26: 80 mg via INTRAVENOUS

## 2021-07-26 MED ORDER — LACTATED RINGERS IV SOLN: INTRAVENOUS | Status: DC

## 2021-07-26 MED ORDER — PROPOFOL 10 MG/ML IV BOLUS
INTRAVENOUS | Status: DC | PRN
  Administered 2021-07-26 (×2): 20 mg via INTRAVENOUS

## 2021-07-26 MED ORDER — PROPOFOL 500 MG/50ML IV EMUL
INTRAVENOUS | Status: DC | PRN
  Administered 2021-07-26: 100 ug/kg/min via INTRAVENOUS

## 2021-07-26 MED ORDER — BENEFIBER ON THE GO PO PACK: 1.0000 | PACK | Freq: Three times a day (TID) | ORAL | 11 refills | Status: DC

## 2021-07-26 MED ORDER — PROPOFOL 1000 MG/100ML IV EMUL
INTRAVENOUS | Status: AC
  Filled 2021-07-26: qty 100

## 2021-07-26 NOTE — Transfer of Care (Signed)
Immediate Anesthesia Transfer of Care Note  Patient: Demitris Pokorny  Procedure(s) Performed: [facility] BIOPSY  Patient Location: Endoscopy Unit  Anesthesia Type:MAC  Level of Consciousness: drowsy and patient cooperative  Airway & Oxygen Therapy: Patient Spontanous Breathing and Patient connected to face mask oxygen  Post-op Assessment: Report given to RN and Post -op Vital signs reviewed and stable  Post vital signs: Reviewed and stable  Last Vitals:  Vitals Value Taken Time  BP 110/62 07/26/21 0921  Temp    Pulse 75 07/26/21 0923  Resp 13 07/26/21 0923  SpO2 100 % 07/26/21 0923  Vitals shown include unvalidated device data.  Last Pain:  Vitals:   07/26/21 0745  TempSrc: Oral  PainSc: 0-No pain         Complications: No notable events documented.

## 2021-07-26 NOTE — Anesthesia Postprocedure Evaluation (Signed)
Anesthesia Post Note  Patient: [name] Branch  Procedure(s) Performed: [facility] BIOPSY     Patient location during evaluation: PACU Anesthesia Type: MAC Level of consciousness: awake and alert Pain management: pain level controlled Vital Signs Assessment: post-procedure vital signs reviewed and stable Respiratory status: spontaneous breathing, nonlabored ventilation, respiratory function stable and patient connected to nasal cannula oxygen Cardiovascular status: stable and blood pressure returned to baseline Postop Assessment: no apparent nausea or vomiting Anesthetic complications: no   No notable events documented.  Last Vitals:  Vitals:   07/26/21 0930 07/26/21 0940  BP: 125/71 130/76  Pulse: 73 73  Resp: 17 15  Temp:    SpO2: 96% 96%    Last Pain:  Vitals:   07/26/21 0940  TempSrc:   PainSc: 0-No pain                 Kazimir Hartnett

## 2021-07-26 NOTE — Discharge Instructions (Signed)

## 2021-07-26 NOTE — Op Note (Signed)
[facility] Patient Name: [name] Branch Procedure Date: 07/26/2021 MRN: [id number] Attending MD: Mauri Pole , MD Date of Birth: 04-Aug-1965 CSN: [id number] Age: 55 Admit Type: Outpatient Procedure:                Flexible Sigmoidoscopy Indications:              Screening for malignant neoplasm in the rectum Providers:                Mauri Pole, MD, Burtis Junes, RN, Tyna Jaksch Technician Referring MD:              Medicines:                Propofol per Anesthesia, Monitored Anesthesia Care Complications:            No immediate complications. Estimated Blood Loss:     Estimated blood loss was minimal. Procedure:                Pre-Anesthesia Assessment:                           - Prior to the procedure, a History and Physical                            was performed, and patient medications and                            allergies were reviewed. The patient's tolerance of                            previous anesthesia was also reviewed. The risks                            and benefits of the procedure and the sedation                            options and risks were discussed with the patient.                            All questions were answered, and informed consent                            was obtained. Prior Anticoagulants: The patient has                            taken no previous anticoagulant or antiplatelet                            agents. ASA Grade Assessment: III - A patient with                            severe systemic disease. After reviewing the risks  and benefits, the patient was deemed in                            satisfactory condition to undergo the procedure.                           After obtaining informed consent, the scope was                            passed under direct vision. The [id number]-[id number] ([id number])                            Olympus endoscope was  introduced through the anus                            and advanced to the the ileo-rectal anastomosis.                            The flexible sigmoidoscopy was technically                            difficult and complex due to bowel stenosis. The                            quality of the bowel preparation was adequate. Scope In: Scope Out: Findings:      The perianal and digital rectal examinations were normal.      A benign-appearing, intrinsic severe stenosis measuring 1 cm (in length)       x 8 mm (inner diameter) was found at the anastomosis and was traversed       after dilation. A TTS dilator was passed through the scope. Dilation       with an 03-08-09 mm and a 05-11-11 mm anastomotic balloon dilator was       performed to 7mm maximum dimension. The dilation site was examined       following endoscope reinsertion and showed mild mucosal disruption and       moderate improvement in luminal narrowing. Biopsies were taken with a       cold forceps for histology from ileum and ileo rectal anastomosis to       exclude crohn's or neoplastic lesion. Mucosa at anastomosis was very       friable and granular.      The exam was otherwise without abnormality. 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 Dilated.                            Biopsied.                           - The examination was otherwise normal. Moderate Sedation:      N/A Recommendation:           - Use Benefiber two teaspoons PO TID.                           - Resume  previous diet.                           - Continue present medications.                           - No ibuprofen, naproxen, or other non-steroidal                            anti-inflammatory drugs for 2 weeks.                           - Repeat flexible sigmoidoscopy based on biopsy                            results in 10 years                           - Return to GI office at the next available                             appointment in 2-3 weeks with APP or Dr.Jose Corvin. Procedure Code(s):        --- Professional ---                           [phone], Sigmoidoscopy, flexible; with                            transendoscopic balloon dilation                           [id number], 56, Sigmoidoscopy, flexible; with biopsy,                            single or multiple Diagnosis Code(s):        --- Professional ---                           C16.606, Other intestinal obstruction unspecified                            as to partial versus complete obstruction                           Z12.12, Encounter for screening for malignant                            neoplasm of rectum CPT copyright 2019 [employer]. All rights reserved. The codes documented in this report are preliminary and upon coder review may  be revised to meet current compliance requirements. Mauri Pole, MD 07/26/2021 9:26:46 AM This report has been signed electronically. Number of Addenda: 0

## 2021-07-26 NOTE — H&P (Signed)
[facility] History and Physical   Primary Care Physician:  [name], MD   Reason for Procedure:   Colon cancer screening  Plan:    Flexible sigmoidoscopy     HPI: [name] Branch is a 55 y.o. male with h/o schizophrenia, sarcoidosis, CHF, neurogenic bowel s/p sub total colectomy with chronic intermittent diarrhea and fecal incontinence here for colorectal cancer screening. No rectal bleeding or melena The risks and benefits as well as alternatives of endoscopic procedure(s) have been discussed and reviewed. All questions answered. The patient agrees to proceed.    Past Medical History:  Diagnosis Date   ABSCESS, ANAL/RECTAL REGIONS 08/18/2006   Annotation: Fournier gangrene Qualifier: History of  By: Garnette Scheuermann MD, Yogesh     Allergy    Asthma    Cancer [facility])    Cardiomyopathy    Cataract    CHF (congestive heart failure) ([place])    Colonic inertia    Constipation    Emphysema of lung ([place])    GERD (gastroesophageal reflux disease)    Glaucoma    Hyperlipidemia    Hypovolemic shock ([place])    Incontinent of feces    Mental retardation    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   Annotation: chronic with stercoral ulcer  Qualifier: Diagnosis of  By: Garnette Scheuermann MD, Yogesh     Obstruction of colon ([place])    recurrent   Sarcoidosis    Schizophrenia ([place])    Sleep apnea    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   Total self-care deficit    per caregiver he needs to have help bathing and cleaning self and needs help with all daily needs now     Past Surgical History:  Procedure Laterality Date   COLECTOMY N/A 09/25/2014   Procedure: OPEN ABDOMINAL COLECTOMY;  Surgeon: Michael Boston, MD;  Location: [facility];  Service: General;  Laterality: N/A;   COLONOSCOPY  2007   groin surgery     boil drained   MOUTH SURGERY     PROCTOSCOPY N/A 09/25/2014   Procedure: RIGID PROCTOSCOPY;  Surgeon: Michael Boston, MD;  Location: [facility];  Service: General;  Laterality: N/A;    Prior to  Admission medications   Medication Sig Start Date End Date Taking? Authorizing Provider  albuterol (PROVENTIL) (2.5 MG/3ML) 0.083% nebulizer solution Take 2.5 mg by nebulization every 6 (six) hours as needed for wheezing or shortness of breath.   Yes [provider]  albuterol (VENTOLIN HFA) 108 (90 Base) MCG/ACT inhaler Inhale 2 puffs into the lungs every 6 (six) hours as needed for wheezing or shortness of breath.   Yes [provider]  carbamide peroxide (DEBROX) 6.5 % OTIC solution 5 drops daily as needed (ear wax removal).   Yes [provider]  carboxymethylcellulose (REFRESH PLUS) 0.5 % SOLN 1 drop at bedtime. Gel   Yes [provider]  cetirizine (ZYRTEC) 10 MG tablet Take 10 mg by mouth daily. 08/30/20  Yes [provider]  cloZAPine (CLOZARIL) 100 MG tablet Take 200 mg by mouth See admin instructions. Take with 50 mg for a total of 250 mg at bedtime   Yes [provider]  clozapine (CLOZARIL) 50 MG tablet Take 50 mg by mouth See admin instructions. Take with (2) 100 mg for a total of 250 mg at bedtime   Yes [provider]  Dextromethorphan-guaiFENesin (MUCINEX DM) 30-600 MG TB12 Take 1 tablet by mouth daily as needed (Congestion). 60-600 08/31/20  Yes [provider]  FARXIGA 10 MG  TABS tablet Take 10 mg by mouth daily. 05/12/21  Yes [provider]  fluvoxaMINE (LUVOX) 100 MG tablet Take 300 mg by mouth at bedtime. 11/30/20  Yes [provider]  ketoconazole (NIZORAL) 2 % cream Apply 1 application topically daily.   Yes [provider]  latanoprost (XALATAN) 0.005 % ophthalmic solution Place 1 drop into both eyes nightly. 10/08/20  Yes [provider]  metoprolol succinate (TOPROL-XL) 50 MG 24 hr tablet Take 1 tablet (50 mg total) by mouth in the morning. Hold if top blood pressure number less than 100 mmHg or heart rate less than 60 bpm (pulse). 07/05/21 10/03/21 Yes Tolia, Sunit, DO   Multiple Vitamin (MULTIVITAMIN) capsule Take 1 capsule by mouth daily. 03/25/18  Yes Ladell Pier, MD  pravastatin (PRAVACHOL) 40 MG tablet TAKE ONE TABLET EACH DAY 05/31/21  Yes Tolia, Sunit, DO  sacubitril-valsartan (ENTRESTO) 49-51 MG Take 0.5 tablets by mouth 2 (two) times daily. 05/11/21  Yes Tolia, Sunit, DO  sodium chloride (OCEAN) 0.65 % nasal spray Place 1 spray into the nose as needed for congestion.   Yes [provider]  sodium fluoride (PREVIDENT 5000 PLUS) 1.1 % CREA dental cream Use a "grape-size" amount on a soft toothbrush every night.  Do not rinse with water or eat for at least 5 minutes after brushing teeth. 08/11/20 08/11/21 Yes Owsley, Madison B, DMD  tiotropium (SPIRIVA) 18 MCG inhalation capsule Place 18 mcg into inhaler and inhale daily.   Yes [provider]  spironolactone (ALDACTONE) 25 MG tablet Take 0.5 tablets (12.5 mg total) by mouth in the morning. 09/02/20 09/25/20  Rex Kras, DO    Current Facility-Administered Medications  Medication Dose Route Frequency Provider Last Rate Last Admin   0.9 %  sodium chloride infusion   Intravenous Continuous Shaton Lore, Venia Minks, MD        Allergies as of 04/13/2021 - Review Complete 04/01/2021  Allergen Reaction Noted   Hydrocodone Other (See Comments) 03/10/2020    Family History  Problem Relation Age of Onset   Diabetes Mother    Colon cancer Neg Hx    Colon polyps Neg Hx    Esophageal cancer Neg Hx    Rectal cancer Neg Hx    Stomach cancer Neg Hx     Social History   Socioeconomic History   Marital status: Single    Spouse name: Not on file   Number of children: 0   Years of education: Not on file   Highest education level: Not on file  Occupational History    Employer: DISABLED  Tobacco Use   Smoking status: Never   Smokeless tobacco: Never  Vaping Use   Vaping Use: Never used  Substance and Sexual Activity   Alcohol use: No    Alcohol/week: 0.0 standard drinks   Drug use: No    Sexual activity: Not on file    Comment:    Other Topics Concern   Not on file  Social History Narrative   Has a caretaker. [name] (sister) HPOA and Legal Guardian   Social Determinants of Health   Financial Resource Strain: Not on file  Food Insecurity: Not on file  Transportation Needs: Not on file  Physical Activity: Not on file  Stress: Not on file  Social Connections: Not on file  Intimate Partner Violence: Not on file    Review of Systems:  All other review of systems negative except as mentioned in the HPI.  Physical Exam: Vital signs in  last 24 hours: Temp:  [98.4 F (36.9 C)] 98.4 F (36.9 C) (12/27 0745) Pulse Rate:  [87] 87 (12/27 0745) Resp:  [22] 22 (12/27 0745) BP: (124)/(72) 124/72 (12/27 0745) SpO2:  [93 %] 93 % (12/27 0745) Weight:  [123.8 kg] 123.8 kg (12/27 0745)   General:   Alert, NAD Lungs:  Clear .   Heart:  Regular rate and rhythm Abdomen:  Soft, nontender and nondistended. Neuro/Psych:  Alert and cooperative. Normal mood and affect. A and O x 3   K. Denzil Magnuson , MD [phone]

## 2021-07-27 ENCOUNTER — Encounter (HOSPITAL_COMMUNITY): Payer: Self-pay | Admitting: Gastroenterology

## 2021-07-27 LAB — SURGICAL PATHOLOGY

## 2021-08-08 ENCOUNTER — Ambulatory Visit: Payer: Medicare Other | Admitting: Physical Therapy

## 2021-08-09 NOTE — Progress Notes (Signed)
Reviewed and agree with documentation and assessment and plan. K. Veena Eriel Doyon , MD   

## 2021-08-12 ENCOUNTER — Ambulatory Visit: Payer: Commercial Managed Care - HMO

## 2021-08-12 ENCOUNTER — Ambulatory Visit: Payer: Commercial Managed Care - HMO | Admitting: Cardiology

## 2021-08-16 NOTE — Therapy (Signed)
OUTPATIENT PHYSICAL THERAPY LOWER EXTREMITY EVALUATION   Patient Name: [name] Branch MRN: [id number] DOB:22-Nov-1965, 56 y.o., male Today's Date: 08/19/2021   PT End of Session - 08/19/21 1412     Visit Number 1    Number of Visits 2    Date for PT Re-Evaluation 09/09/21    Authorization Type MCR    PT Start Time 1300    PT Stop Time 1340    PT Time Calculation (min) 40 min             Past Medical History:  Diagnosis Date   ABSCESS, ANAL/RECTAL REGIONS 08/18/2006   Annotation: Fournier gangrene Qualifier: History of  By: Garnette Scheuermann MD, Yogesh     Allergy    Asthma    Cancer [facility])    Cardiomyopathy    Cataract    CHF (congestive heart failure) ([facility])    Colonic inertia    Constipation    Emphysema of lung ([facility])    GERD (gastroesophageal reflux disease)    Glaucoma    Hyperlipidemia    Hypovolemic shock ([place])    Incontinent of feces    Mental retardation    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   Annotation: chronic with stercoral ulcer  Qualifier: Diagnosis of  By: Garnette Scheuermann MD, Yogesh     Obstruction of colon ([place])    recurrent   Sarcoidosis    Schizophrenia ([place])    Sleep apnea    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   Total self-care deficit    per caregiver he needs to have help bathing and cleaning self and needs help with all daily needs now    Past Surgical History:  Procedure Laterality Date   BALLOON DILATION N/A 07/26/2021   Procedure: BALLOON DILATION;  Surgeon: Mauri Pole, MD;  [facility];  Service: Endoscopy;  Laterality: N/A;   BIOPSY  07/26/2021   Procedure: BIOPSY;  Surgeon: Mauri Pole, MD;  [facility];  Service: Endoscopy;;   COLECTOMY N/A 09/25/2014   Procedure: OPEN ABDOMINAL COLECTOMY;  Surgeon: Michael Boston, MD;  [facility];  Service: General;  Laterality: N/A;   COLONOSCOPY  2007   FLEXIBLE SIGMOIDOSCOPY N/A 07/26/2021   Procedure: FLEXIBLE SIGMOIDOSCOPY;  Surgeon: Mauri Pole, MD;  [facility];  Service: Endoscopy;  Laterality: N/A;   groin surgery     boil drained   MOUTH SURGERY     PROCTOSCOPY N/A 09/25/2014   Procedure: RIGID PROCTOSCOPY;  Surgeon: Michael Boston, MD;  [facility];  Service: General;  Laterality: N/A;   Patient Active Problem List   Diagnosis Date Noted   Rectal stricture    Encounter for colorectal cancer screening 02/09/2021   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   Incontinent of feces    Total self-care deficit    S/P total colectomy 07/20/2017   Dizziness 10/12/2016   Anemia of chronic disease 10/05/2014   Colonic inertia s/p abdominal colectomy 09/25/2014 09/25/2014   Hyperlipidemia 09/17/2008   Glaucoma 10/10/2006   Sarcoidosis 06/11/2006   Schizophrenia, unspecified type ([place]) 06/11/2006   Intellectual disability 22/08/5425   Chronic systolic CHF (congestive heart failure) ([place]) 06/11/2006   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    PCP: [name], MD  REFERRING PROVIDER: Margretta Sidle, MD  REFERRING DIAG: Referral diagnosis: Unsteadiness on feet [R26.81], Unspecified intellectual disabilities [F79]  THERAPY DIAG:  Other abnormalities of gait and mobility  ONSET DATE: Summer 2022  SUBJECTIVE:  [name] Branch is a 56 y.o. male who presents to clinic with caregiver (sister) with need for walker training so that he can use a walker in the day program he attends.  MOI/History of condition:  Caregiver (sister) provides history.  He started using a rollator (4 wheeled walker) about 6 months ago.  He attends an adult day program.  They require him to get walker training before he attends as he has some trouble controlling the walker according to them.  His sister says that he tends to  bump into things, but he does pretty well overall.  She is surprised that they are giving him trouble about using a walker.   Red flags:  denies   Pertinent past history:  Schizophrenia, total self-care deficit, CHF, Hx of cancer, cognitive deficit  Pain:  Are you having pain? No   Occupation: NA  Hobbies/Recreation: NA  Assistive Device: rollator  Patient Goals: be able to use walker safely at his adult day program   PRECAUTIONS: Fall  WEIGHT BEARING RESTRICTIONS No  FALLS:  Has patient fallen in last 6 months? No, Number of falls: 0  LIVING ENVIRONMENT: Lives with: lives with their family Stairs: No;   PLOF: Needs assistance with ADLs, Needs assistance with homemaking, and Needs assistance with gait  DIAGNOSTIC FINDINGS: none   OBJECTIVE:    GENERAL OBSERVATION:   Pt stable in standing, independent with sit->stand transfer, unstable gait without walker.  Using 4 wheeled walker entering clinic.  At times walking a little fast near objects, but overall with good form.  Responds well to verbal cues to slow down.  FUNCTIONAL TESTS:  Walking around clinic using 4 wheeled walker, open spaces, near people, through narrow corridors, and weaving in and out of 4 cones.  Pt does not hit any objects and responds well to verbal cues to slow down 2x when taking corners.  Also does well with trial of FWW with slower speed compared to 4 wheeled walker.  HOME EXERCISE PROGRAM: Working on navigating around obstacles at home  ASSESSMENT:  CLINICAL IMPRESSION: [name] Branch is a 56 y.o. male who presents to clinic with his caregiver (sister) requesting walker training so that he can use his walker at adult day center.  Overall, he seems to be doing well with his walker.  He does require some verbal cuing to slow down around corners, but he responds well to this.  We trialed a FWW which does slow him down a bit; I talked to his sister about possibly using a FWW at daycare as it has a lower  profile and naturally slows his speed.  She will consider this.  I think that this point he may return for 1 visit at patients/caregivers request for some additional instruction on setting up an obstacle course at home.  Otherwise, I don't feel like his current use of the walker poses any significant danger as long has he is supervised.  His caregiver/sister talked about the possibility of switching care centers if they are unable to accommodate his use of a walker; I think this is reasonable.  Letter issued for care center:  "To Whom It May Concern,  [name] attended walker training at [facility].  He did well with an obstacle course and was able to control the 4 wheel walker throughout the session.  He will attend 1 additional safety training session.  He may require verbal cues to slow down around objects and people.  It is my recommendation that he  use a walker for safety.  Shearon Balo, DPT"   REHAB POTENTIAL: Good  CLINICAL DECISION MAKING: Stable/uncomplicated  EVALUATION COMPLEXITY: Low   GOALS:  SHORT TERM GOALS:  STG Name Target Date Goal status  1 Pt and caregiver will feel confident in pts ability to use 4 wheel walker or FWW for ambulation safely. 09/09/2021 INITIAL   PLAN: PT FREQUENCY: 1 additional visit  PT DURATION: 4 weeks (Ending 09/09/21)  PLANNED INTERVENTIONS: Therapeutic exercises, Therapeutic activity, Neuro Muscular re-education, Gait training, Patient/Family education, Joint mobilization, Dry Needling, Electrical stimulation, Spinal mobilization and/or manipulation, Moist heat, Taping, Vasopneumatic device, Ionotophoresis 4mg /ml Dexamethasone, and Manual therapy  PLAN FOR NEXT SESSION: obstacle course and verbal instruction on how to practice with walker at home   Shearon Balo PT, DPT 08/19/2021, 2:13 PM

## 2021-08-19 ENCOUNTER — Encounter: Payer: Self-pay | Admitting: Physical Therapy

## 2021-08-19 ENCOUNTER — Ambulatory Visit: Payer: Medicare Other | Attending: Family Medicine | Admitting: Physical Therapy

## 2021-08-19 ENCOUNTER — Other Ambulatory Visit: Payer: Self-pay

## 2021-08-19 DIAGNOSIS — R2689 Other abnormalities of gait and mobility: Secondary | ICD-10-CM | POA: Insufficient documentation

## 2021-08-26 ENCOUNTER — Other Ambulatory Visit: Payer: Self-pay

## 2021-08-26 ENCOUNTER — Ambulatory Visit: Payer: Medicare Other | Admitting: Cardiology

## 2021-08-26 ENCOUNTER — Encounter: Payer: Self-pay | Admitting: Cardiology

## 2021-08-26 VITALS — BP 107/70 | HR 93 | Temp 97.7°F | Ht 74.0 in | Wt 254.0 lb

## 2021-08-26 DIAGNOSIS — I4510 Unspecified right bundle-branch block: Secondary | ICD-10-CM

## 2021-08-26 DIAGNOSIS — E782 Mixed hyperlipidemia: Secondary | ICD-10-CM

## 2021-08-26 DIAGNOSIS — I5042 Chronic combined systolic (congestive) and diastolic (congestive) heart failure: Secondary | ICD-10-CM

## 2021-08-26 DIAGNOSIS — I429 Cardiomyopathy, unspecified: Secondary | ICD-10-CM

## 2021-08-26 DIAGNOSIS — F209 Schizophrenia, unspecified: Secondary | ICD-10-CM

## 2021-08-26 MED ORDER — ENTRESTO 24-26 MG PO TABS: 1.0000 | ORAL_TABLET | Freq: Two times a day (BID) | ORAL | 0 refills | Status: AC

## 2021-08-26 NOTE — Progress Notes (Signed)
ID:  [name] Branch, DOB [birth year], MRN [id number]  PCP:  [name] Han, MD  Cardiologist: [name] Kras, DO, [facility] (established care 07/12/2020) Former Cardiology Providers: Dr. Cloretta Branch and [name] Flake PA.  Date: 08/26/21 Last Office Visit: 12/31/2020  Chief Complaint  Patient presents with   Congestive Heart Failure   Follow-up    HPI  [name] Branch is a 56 y.o. 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  He is referred to the office at the request of [name] Branch* for evaluation of cardiomyopathy and chronic congestive heart.   Given his intellectual disability and schizophrenia his is accompanied by his guardian [name] Branch (sister / guardian) [phone].    He was formally under the care of [name] Branch at [facility] and re-established care with our practice in December 2021 for management of cardiomyopathy presumed to be nonischemic and chronic combined systolic and diastolic heart failure.  Based on EMR patient is noted to have cardiomyopathy since 2016 at the time of colon surgery.  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 Patient's sister states that he subsequently underwent surgery and completed radiation treatment as of 06/16/2020. Since establishing care with myself his GDMT has been uptitrated in a stepwise fashion and he underwent a repeat echocardiogram which notes preserved LVEF of 50-55% and grade 1 diastolic impairment.     Patient is here for 53-month follow-up visit.  He is doing well from a cardiovascular standpoint no hospitalizations or urgent care visits for cardiovascular symptoms.  He denies anginal discomfort or heart failure symptoms.  He has gained approximately 7 pounds since last office visit this is likely due to increased caloric intake.  Since last office visit he has undergone a colonoscopy which has been uneventful from a cardiac standpoint.   FUNCTIONAL STATUS: Able to ambulate 29minutes five day a week, depending on the weather.   ALLERGIES: Allergies  Allergen Reactions   Hydrocodone Other (See Comments)    Exacerbation of urinary and fecal incontinence    MEDICATION LIST PRIOR TO VISIT: Current Meds  Medication Sig   albuterol (PROVENTIL) (2.5 MG/3ML) 0.083% nebulizer solution Take 2.5 mg by nebulization every 6 (six) hours as needed for wheezing or shortness of breath.   albuterol (VENTOLIN HFA) 108 (90 Base) MCG/ACT inhaler Inhale 2 puffs into the lungs every 6 (six) hours as needed for wheezing or shortness of breath.   carbamide peroxide (DEBROX) 6.5 % OTIC solution 5 drops daily as needed (ear wax removal).   carboxymethylcellulose (REFRESH PLUS) 0.5 % SOLN 1 drop at bedtime. Gel   cetirizine (ZYRTEC) 10 MG tablet Take 10 mg by mouth daily.   cloZAPine (CLOZARIL) 100 MG tablet Take 200 mg by mouth See admin instructions. Take with 50 mg for a total of 250 mg at bedtime   clozapine (CLOZARIL) 50 MG tablet Take 50 mg by mouth See admin instructions. Take with (2) 100 mg for a total of 250 mg at bedtime   Dextromethorphan-guaiFENesin (MUCINEX DM) 30-600 MG TB12 Take 1 tablet by mouth daily as needed (Congestion). 60-600   FARXIGA 10 MG TABS tablet Take 10  mg by mouth daily.   fluvoxaMINE (LUVOX) 100 MG tablet Take 300 mg by mouth at bedtime.   ketoconazole (NIZORAL) 2 % cream Apply 1 application topically daily.   latanoprost (XALATAN) 0.005 % ophthalmic solution Place 1 drop into  both eyes nightly.   metoprolol succinate (TOPROL-XL) 50 MG 24 hr tablet Take 1 tablet (50 mg total) by mouth in the morning. Hold if top blood pressure number less than 100 mmHg or heart rate less than 60 bpm (pulse).   Multiple Vitamin (MULTIVITAMIN) capsule Take 1 capsule by mouth daily.   pravastatin (PRAVACHOL) 40 MG tablet TAKE ONE TABLET EACH DAY   sacubitril-valsartan (ENTRESTO) 49-51 MG Take 0.5 tablets by mouth 2 (two) times daily.   sodium chloride (OCEAN) 0.65 % nasal spray Place 1 spray into the nose as needed for congestion.   tiotropium (SPIRIVA) 18 MCG inhalation capsule Place 18 mcg into inhaler and inhale daily.   Wheat Dextrin (BENEFIBER ON THE GO) PACK Take 1 packet by mouth 3 (three) times daily with meals.     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   Annotation: chronic with stercoral ulcer  Qualifier: Diagnosis of  By: [name] Scheuermann MD, [name] Branch     Obstruction of colon ([place])    recurrent   Sarcoidosis    Schizophrenia ([place])    Sleep apnea    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   Total self-care deficit    per caregiver he needs to have help bathing and cleaning self and needs help with all daily needs now     PAST SURGICAL HISTORY: Past Surgical History:  Procedure Laterality Date   BALLOON DILATION N/A 07/26/2021   Procedure: BALLOON DILATION;  Surgeon: [name] Pole, MD;  [facility]: [facility];  Service: Endoscopy;  Laterality: N/A;   BIOPSY  07/26/2021   Procedure: BIOPSY;   Surgeon: [name] Pole, MD;  [facility]: [facility];  Service: Endoscopy;;   COLECTOMY N/A 09/25/2014   Procedure: OPEN ABDOMINAL COLECTOMY;  Surgeon: [name] Boston, MD;  Location: [facility];  Service: General;  Laterality: N/A;   COLONOSCOPY  2012   FLEXIBLE SIGMOIDOSCOPY N/A 07/26/2021   Procedure: FLEXIBLE SIGMOIDOSCOPY;  Surgeon: [name] Pole, MD;  [facility];  Service: Endoscopy;  Laterality: N/A;   groin surgery     boil drained   MOUTH SURGERY     PROCTOSCOPY N/A 09/25/2014   Procedure: RIGID PROCTOSCOPY;  Surgeon: [name] Boston, MD;  Location: [facility];  Service: General;  Laterality: N/A;    FAMILY HISTORY: The patient family history includes Diabetes in his mother.  SOCIAL HISTORY:  The patient  reports that he has never smoked. He has never used smokeless tobacco. He reports that he does not drink alcohol and does not use drugs.  REVIEW OF SYSTEMS: Review of Systems  Constitutional: Negative for chills and fever.  HENT:  Negative for  hoarse voice and nosebleeds.   Eyes:  Negative for discharge, double vision and pain.  Cardiovascular:  Negative for chest pain, claudication, dyspnea on exertion, leg swelling, near-syncope, orthopnea, palpitations, paroxysmal nocturnal dyspnea and syncope.  Respiratory:  Negative for hemoptysis and shortness of breath.   Musculoskeletal:  Negative for muscle cramps and myalgias.  Gastrointestinal:  Negative for abdominal pain, constipation, diarrhea, hematemesis, hematochezia, melena, nausea and vomiting.  Neurological:  Negative for dizziness and light-headedness.  PHYSICAL EXAM: Vitals with BMI 08/26/2021 07/26/2021 07/26/2021  Height 6\' 2"  - -  Weight 254 lbs - -  BMI 16.1 - -  Systolic 096 045 409  Diastolic 70 76 71  Pulse 93 73 73    CONSTITUTIONAL: Appears older than stated age, hemodynamically stable, no acute distress.  SKIN: Skin is warm and dry. No rash noted. No cyanosis. No pallor. No jaundice HEAD:  Normocephalic and atraumatic.  EYES: No scleral icterus MOUTH/THROAT: Moist oral membranes.  NECK: No JVD present. No thyromegaly noted. No carotid bruits  LYMPHATIC: No visible cervical adenopathy.  CHEST Normal respiratory effort. No intercostal retractions  LUNGS: Clear to auscultation bilaterally. No stridor. No wheezes. No rales.  CARDIOVASCULAR: Regular, tachycardia, positive S1-S2, no murmurs rubs or gallops appreciated. ABDOMINAL: No apparent ascites.  EXTREMITIES: No peripheral edema, 2+ dorsalis pedis and posterior tibial pulses. HEMATOLOGIC: No significant bruising NEUROLOGIC: Oriented to person, place, and time. Nonfocal. Normal muscle tone.  PSYCHIATRIC: Normal mood and affect. Normal behavior. Cooperative  CARDIAC DATABASE: EKG: 08/26/2021: NSR, 91 bpm, left axis, right bundle branch block.  Echocardiogram: Echo 03/10/2020 and [facility] MODERATE LV DYSFUNCTION EF 35%, LV normal size NORMAL RIGHT VENTRICULAR SYSTOLIC FUNCTION VALVULAR REGURGITATION: MILD AR, TRIVIAL MR, TRIVIAL PR, TRIVIAL TR NO VALVULAR STENOSIS  12/06/2020: Left ventricle cavity is normal in size. Mild concentric hypertrophy of the left ventricle. Normal global wall motion. Normal LV systolic function with EF 50-55%. Doppler evidence of grade I (impaired) diastolic dysfunction, normal LAP.  Structurally normal trileaflet aortic valve. Mild (Grade I) aortic regurgitation. Mild tricuspid regurgitation.  No evidence of pulmonary hypertension.  Stress Testing: No results found for this or any previous visit from the past 1095 days.  Heart Catheterization: None  LABORATORY DATA: CBC Latest Ref Rng & Units 05/12/2021 09/25/2020 07/28/2020  WBC 4.0 - 10.5 K/uL 5.6 4.5 4.1  Hemoglobin 13.0 - 17.0 g/dL 14.8 13.8 14.7  Hematocrit 39.0 - 52.0 % 45.9 43.3 48.7  Platelets 150 - 400 K/uL 159 145(L) 161    CMP Latest Ref Rng & Units 05/12/2021 10/19/2020 09/25/2020  Glucose 70 - 99 mg/dL 91 97 90   BUN 6 - 20 mg/dL 9 10 16   Creatinine 0.61 - 1.24 mg/dL 1.12 1.06 1.24  Sodium 135 - 145 mmol/L 140 143 136  Potassium 3.5 - 5.1 mmol/L 4.1 4.5 4.1  Chloride 98 - 111 mmol/L 105 102 97(L)  CO2 22 - 32 mmol/L 28 24 30   Calcium 8.9 - 10.3 mg/dL 9.2 9.1 9.5  Total Protein 6.5 - 8.1 g/dL - - 7.8  Total Bilirubin 0.3 - 1.2 mg/dL - - 0.7  Alkaline Phos 38 - 126 U/L - - 109  AST 15 - 41 U/L - - 42(H)  ALT 0 - 44 U/L - - 63(H)    Lipid Panel     Component Value Date/Time   CHOL 133 03/25/2018 1155   TRIG 64 03/25/2018 1155   HDL 59 03/25/2018 1155   CHOLHDL 2.3 03/25/2018 1155   CHOLHDL 3.4  02/25/2014 1034   VLDL 11 02/25/2014 1034   LDLCALC 61 03/25/2018 1155   LABVLDL 13 03/25/2018 1155    No components found for: NTPROBNP Recent Labs    09/13/20 1524 10/19/20 1510  PROBNP 30 36    Recent Labs    04/01/21 1221  TSH 0.855    BMP Recent Labs    09/13/20 1524 09/25/20 1746 10/19/20 1509 05/12/21 1620  NA 138 136 143 140  K 4.7 4.1 4.5 4.1  CL 103 97* 102 105  CO2 20 30 24 28   GLUCOSE 90 90 97 91  BUN 17 16 10 9   CREATININE 1.21 1.24 1.06 1.12  CALCIUM 9.3 9.5 9.1 9.2  GFRNONAA 67 >60  --  >60  GFRAA 78  --   --   --      HEMOGLOBIN A1C Lab Results  Component Value Date   HGBA1C 6.0 (H) 09/18/2014   MPG 126 09/18/2014    IMPRESSION:    ICD-10-CM   1. Chronic combined systolic and diastolic CHF, NYHA class 2 (HCC)  I50.42 EKG 12-Lead       RECOMMENDATIONS: [name] Branch is a 56 y.o. 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  Chronic heart failure with preserved EF, NYHA class II, stage B: Echo 02/2020: LVEF 35%. Echo 11/2020: LVEF  50-55%. No recent hospitalizations for congestive heart failure. Overall euvolemic and not in heart failure for physical examination. Currently on Entresto, pravastatin, Toprol-XL, Farxiga. Currently he is doing Entresto 49/51 mg half a tablet twice a day.  To reduce the inconvenience of cutting the tablets in half we will change the prescription to Entresto 24/26 mg p.o. twice daily. Unable to tolerate higher doses of Entresto or spironolactone due to hypotension. Weight relatively stable, up 7 pounds due to caloric intake. Reemphasized importance of a low-salt diet, 30 minutes of moderate intensity exercise if able to tolerate 5 days a week, and improving his modifiable cardiovascular risk factors.  Recovered cardiomyopathy: See above.   Hyperlipidemia, mixed: Continue statin.  Currently managed by primary care provider.  RBBB: Continue monitor.   FINAL MEDICATION LIST END OF ENCOUNTER:   Current Outpatient Medications:    albuterol (PROVENTIL) (2.5 MG/3ML) 0.083% nebulizer solution, Take 2.5 mg by nebulization every 6 (six) hours as needed for wheezing or shortness of breath., Disp: , Rfl:    albuterol (VENTOLIN HFA) 108 (90 Base) MCG/ACT inhaler, Inhale 2 puffs into the lungs every 6 (six) hours as needed for wheezing or shortness of breath., Disp: , Rfl:    carbamide peroxide (DEBROX) 6.5 % OTIC solution, 5 drops daily as needed (ear wax removal)., Disp: , Rfl:    carboxymethylcellulose (REFRESH PLUS) 0.5 % SOLN, 1 drop at bedtime. Gel, Disp: , Rfl:    cetirizine (ZYRTEC) 10 MG tablet, Take 10 mg by mouth daily., Disp: , Rfl:    cloZAPine (CLOZARIL) 100 MG tablet, Take 200 mg by mouth See admin instructions. Take with 50 mg for a total of 250 mg at bedtime, Disp: , Rfl:    clozapine (CLOZARIL) 50 MG tablet, Take 50 mg by mouth See admin instructions. Take with (2) 100 mg for a total of 250 mg at bedtime, Disp: , Rfl:    Dextromethorphan-guaiFENesin (MUCINEX DM) 30-600 MG TB12, Take 1  tablet by mouth daily as needed (Congestion). 60-600, Disp: , Rfl:    FARXIGA 10 MG TABS tablet, Take 10 mg by mouth daily., Disp: , Rfl:    fluvoxaMINE (LUVOX) 100 MG tablet, Take 300 mg by mouth at bedtime., Disp: , Rfl:    ketoconazole (NIZORAL) 2 % cream, Apply 1 application topically daily., Disp: , Rfl:    latanoprost (XALATAN) 0.005 % ophthalmic solution, Place 1 drop into both eyes nightly., Disp: , Rfl:    metoprolol succinate (TOPROL-XL) 50 MG 24 hr tablet, Take 1 tablet (50 mg total) by mouth in the morning. Hold if top blood pressure number less than 100 mmHg or heart rate less than 60 bpm (pulse)., Disp: 90 tablet, Rfl: 1   Multiple Vitamin (MULTIVITAMIN) capsule, Take 1 capsule by mouth daily., Disp: 90 capsule, Rfl: 3   pravastatin (PRAVACHOL) 40 MG tablet, TAKE ONE TABLET EACH DAY, Disp: 90 tablet, Rfl: 3   sacubitril-valsartan (ENTRESTO) 49-51 MG, Take 0.5 tablets by mouth 2 (two) times daily., Disp: 60 tablet, Rfl: 2   sodium chloride (OCEAN) 0.65 % nasal spray, Place 1 spray into the nose as needed for congestion., Disp: , Rfl:    tiotropium (SPIRIVA) 18 MCG inhalation capsule, Place 18 mcg into inhaler and inhale daily., Disp: , Rfl:    Wheat Dextrin (BENEFIBER ON THE GO) PACK, Take 1 packet by mouth 3 (three) times daily with meals., Disp: 320 each, Rfl: 11  Orders Placed This Encounter  Procedures   EKG 12-Lead    There are no Patient Instructions on file for this visit.   --Continue cardiac medications as reconciled in final medication list. --No follow-ups on file. Or sooner if needed. --Continue follow-up with your primary care physician regarding the management of your other chronic comorbid conditions.  Patient's questions and concerns were addressed to his satisfaction. He voices understanding of the instructions provided during this encounter.   This note was created using a voice recognition software as a result there may be grammatical errors inadvertently  enclosed that do not reflect the nature of this encounter. Every attempt is made to correct such errors.  [name] Branch, [place], [facility]  Pager: [phone] Office: [phone]

## 2021-08-29 ENCOUNTER — Telehealth: Payer: Self-pay | Admitting: Internal Medicine

## 2021-08-29 NOTE — Telephone Encounter (Signed)
Dr. Annamaria Boots please advise if this patient still needs a titration study done or if he needs to be scheduled for and office visit with you.

## 2021-08-29 NOTE — Telephone Encounter (Signed)
If they want [place] to manage and help with his care, theen it would be best for [place] to see him here as a sleep medicine consult.

## 2021-08-29 NOTE — Telephone Encounter (Signed)
Tiffinay from [facility] called and states patient had a sleep study with Dr. Annamaria Boots on 02/19/2020 and patient was instructed, "Advise return for in-center CPAP titration" but this was never done. Informed her that patient has never been seen in our office as a patient, only the sleep study. Patient's sister Ander Purpura would like to know next steps.   Please advise.

## 2021-08-30 NOTE — Telephone Encounter (Signed)
Lm for Tiffany.

## 2021-09-01 ENCOUNTER — Ambulatory Visit: Payer: Medicare Other | Admitting: Physical Therapy

## 2021-09-07 NOTE — Telephone Encounter (Signed)
Called and spoke with Tiffany letting her know the info per CY and she verbalized understanding and stated she would discuss this with pt's provider and would then probably go ahead and place referral order for pt to be referred to our office. Nothing further needed.

## 2021-09-07 NOTE — Telephone Encounter (Signed)
[name] Branch is returning phone call. [name] Branch phone number is [phone].

## 2021-09-13 ENCOUNTER — Other Ambulatory Visit: Payer: Self-pay

## 2021-09-13 ENCOUNTER — Encounter: Payer: Self-pay | Admitting: Physical Therapy

## 2021-09-13 ENCOUNTER — Ambulatory Visit: Payer: Medicare Other | Attending: Family Medicine | Admitting: Physical Therapy

## 2021-09-13 DIAGNOSIS — R1310 Dysphagia, unspecified: Secondary | ICD-10-CM | POA: Insufficient documentation

## 2021-09-13 DIAGNOSIS — R2689 Other abnormalities of gait and mobility: Secondary | ICD-10-CM | POA: Diagnosis not present

## 2021-09-13 NOTE — Therapy (Addendum)
OUTPATIENT PHYSICAL THERAPY TREATMENT NOTE   Patient Name: [name] Branch MRN: [id number] DOB:07/02/1966, 56 y.o., male Today's Date: 09/13/2021  PCP: [name], MD REFERRING PROVIDER: Margretta Sidle, MD   PT End of Session - 09/13/21 1747     Visit Number 2    Number of Visits 2    Date for PT Re-Evaluation 09/09/21    Authorization Type MCR    PT Start Time 0546    PT Stop Time 0616    PT Time Calculation (min) 30 min             Past Medical History:  Diagnosis Date   ABSCESS, ANAL/RECTAL REGIONS 08/18/2006   Annotation: Fournier gangrene Qualifier: History of  By: Garnette Scheuermann MD, Yogesh     Allergy    Asthma    Cancer [facility])    Cardiomyopathy    Cataract    CHF (congestive heart failure) ([place])    Colonic inertia    Constipation    Emphysema of lung ([place])    GERD (gastroesophageal reflux disease)    Glaucoma    Hyperlipidemia    Hypovolemic shock ([place])    Incontinent of feces    Mental retardation    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   Annotation: chronic with stercoral ulcer  Qualifier: Diagnosis of  By: Garnette Scheuermann MD, Yogesh     Obstruction of colon ([place])    recurrent   Sarcoidosis    Schizophrenia ([place])    Sleep apnea    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   Total self-care deficit    per caregiver he needs to have help bathing and cleaning self and needs help with all daily needs now    Past Surgical History:  Procedure Laterality Date   BALLOON DILATION N/A 07/26/2021   Procedure: BALLOON DILATION;  Surgeon: Mauri Pole, MD;  [facility];  Service: Endoscopy;  Laterality: N/A;   BIOPSY  07/26/2021   Procedure: BIOPSY;  Surgeon: Mauri Pole, MD;  [facility];  Service: Endoscopy;;   COLECTOMY N/A 09/25/2014   Procedure: OPEN ABDOMINAL COLECTOMY;  Surgeon: Michael Boston, MD;  [facility];  Service: General;  Laterality: N/A;   COLONOSCOPY  2012   FLEXIBLE SIGMOIDOSCOPY N/A 07/26/2021   Procedure: FLEXIBLE  SIGMOIDOSCOPY;  Surgeon: Mauri Pole, MD;  [facility];  Service: Endoscopy;  Laterality: N/A;   groin surgery     boil drained   MOUTH SURGERY     PROCTOSCOPY N/A 09/25/2014   Procedure: RIGID PROCTOSCOPY;  Surgeon: Michael Boston, MD;  [facility];  Service: General;  Laterality: N/A;   Patient Active Problem List   Diagnosis Date Noted   Rectal stricture    Encounter for colorectal cancer screening 02/09/2021   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[place]) 04/22/2020   Incontinent of feces    Total self-care deficit    S/P total colectomy 07/20/2017   Dizziness 10/12/2016   Anemia of chronic disease 10/05/2014   Colonic inertia s/p abdominal colectomy 09/25/2014 09/25/2014   Hyperlipidemia 09/17/2008   Glaucoma 10/10/2006   Sarcoidosis 06/11/2006   Schizophrenia, unspecified type ([place]) 06/11/2006   Intellectual disability 90/30/0923   Chronic systolic CHF (congestive heart failure) ([place]) 06/11/2006   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    REFERRING DIAG: Margretta Sidle, MD  THERAPY DIAG:  Other abnormalities of gait and mobility  Dysphagia, unspecified type  PERTINENT HISTORY: Schizophrenia, total self-care deficit, CHF, Hx of cancer, cognitive  deficit  PRECAUTIONS/RESTRICTIONS:   PRECAUTIONS: Fall   WEIGHT BEARING RESTRICTIONS No  SUBJECTIVE:  Pt accompanied by his sister.  She states that he is doing well with his walker at home.  Pain:  Are you having pain? No  OBJECTIVE:           GENERAL OBSERVATION:                     Pt stable in standing, independent with sit->stand transfer, unstable gait without walker.  Using 4 wheeled walker entering clinic.  At times walking a little fast near objects, but overall with good form.  Responds well to verbal cues to slow down.   FUNCTIONAL TESTS:  Walking around clinic using 4 wheeled walker, open spaces, near people, through narrow corridors, and weaving in and  out of 4 cones.  Pt does not hit any objects and responds well to verbal cues to slow down 2x when taking corners.  Also does well with trial of FWW with slower speed compared to 4 wheeled walker.  TREATMENT 09/13/2021:  Therapeutic Exercise: - nu-step L5 72m while taking subjective and planning session with patient - obstacle course including walking through clinic through narrow passages between equipment, near people, through cones while using 4 wheeled walker.  Verbal cues and practice locking rollator for brief seated rest breaks.    CLINICAL IMPRESSION: [place], again, does well with control of his walker in PT.  We went through several challenging obstacle courses.  He was able to navigate these obstacles with only minor cuing to reduce speed while turning.  There should be no issue with him using a walker in a supervised setting.  He does have some difficulty remembering to lock walker before sitting, which I recommend he work on at home.  Letter re- issued for care center:   "To Whom It May Concern,   [name] attended walker training at [facility].  He did well with an obstacle course and was able to control the 4 wheel walker throughout the session.  He will attend 1 additional safety training session.  He may require verbal cues to slow down around objects and people.  It is my recommendation that he use a walker for safety.   Shearon Balo, DPT"     REHAB POTENTIAL: Good   CLINICAL DECISION MAKING: Stable/uncomplicated   EVALUATION COMPLEXITY: Low   Kevan Ny Rhonda Vangieson PT 09/13/2021, 6:22 PM   q

## 2021-09-22 ENCOUNTER — Ambulatory Visit (INDEPENDENT_AMBULATORY_CARE_PROVIDER_SITE_OTHER): Payer: Medicare Other | Admitting: Podiatry

## 2021-09-22 ENCOUNTER — Other Ambulatory Visit: Payer: Self-pay

## 2021-09-22 DIAGNOSIS — S91209A Unspecified open wound of unspecified toe(s) with damage to nail, initial encounter: Secondary | ICD-10-CM

## 2021-09-22 DIAGNOSIS — S91202D Unspecified open wound of left great toe with damage to nail, subsequent encounter: Secondary | ICD-10-CM

## 2021-09-22 NOTE — Patient Instructions (Signed)

## 2021-09-26 NOTE — Progress Notes (Signed)
°  Subjective:  Patient ID: Lehman Whiteley, male    DOB: 07-24-1966,  MRN: [id number]  Chief Complaint  Patient presents with   Nail Problem    Left hallux- possible injury to hallux- patient's aide mentioned she is unaware of what happened to toe-     56 y.o. male presents with the above complaint. History confirmed with patient.  Here with his sister.  Not sure what happened but may have pulled off part of the toenail.  Objective:  Physical Exam: warm, good capillary refill, normal DP and PT pulses, and left hallux nail nearly completely avulsed only attached proximally, mild bleeding from this.  Assessment:   1. Traumatic avulsion of nail plate of toe, initial encounter      Plan:  Patient was evaluated and treated and all questions answered.  Nail was only partially attached today.  I recommended avulsion of the remaining nail plate.  This was done today following digital block with local anesthetic.  He tolerated the procedure well.  Post care instructions given.  I will see him back in 2 weeks for evaluation  Return in about 2 weeks (around 10/06/2021) for Nail Check .

## 2021-09-27 ENCOUNTER — Other Ambulatory Visit: Payer: Self-pay

## 2021-09-27 ENCOUNTER — Ambulatory Visit (HOSPITAL_COMMUNITY)
Admission: RE | Admit: 2021-09-27 | Discharge: 2021-09-27 | Disposition: A | Discharge status: Home / Self Care | Payer: Medicare Other | Source: Ambulatory Visit | Attending: Neurology | Admitting: Neurology

## 2021-09-27 DIAGNOSIS — R9401 Abnormal electroencephalogram [EEG]: Secondary | ICD-10-CM | POA: Insufficient documentation

## 2021-09-27 DIAGNOSIS — R569 Unspecified convulsions: Secondary | ICD-10-CM

## 2021-09-27 NOTE — Procedures (Addendum)
Patient Name: Jefte Carithers  MRN: [id number]  Epilepsy Attending: Lora Havens  Referring Physician/Provider: Melburn Popper, MD Date: 09/27/2021 Duration: 26 mins  Patient history: 56yo m obtaining EEG due to with concern for seizure  Level of alertness: Awake  AEDs during EEG study: None  Technical aspects: This EEG study was done with scalp electrodes positioned according to the 10-20 International system of electrode placement. Electrical activity was acquired at a sampling rate of 500Hz  and reviewed with a high frequency filter of 70Hz  and a low frequency filter of 1Hz . EEG data were recorded continuously and digitally stored.   Description: No clear posterior dominant rhythm was seen. EEG showed continuous generalized 3 to 6 Hz theta-delta slowing. Physiologic photic driving was not seen during photic stimulation.  Hyperventilation was not performed.     ABNORMALITY - Continuous slow, generalized  IMPRESSION: This study is suggestive of moderate diffuse encephalopathy, nonspecific etiology. No seizures or epileptiform discharges were seen throughout the recording.  Beila Purdie Barbra Sarks

## 2021-09-27 NOTE — Progress Notes (Signed)
EEG complete - results pending 

## 2021-10-06 ENCOUNTER — Ambulatory Visit: Payer: Medicare Other | Admitting: Podiatry

## 2021-10-27 ENCOUNTER — Telehealth: Payer: Self-pay | Admitting: Physician Assistant

## 2021-10-27 NOTE — Telephone Encounter (Signed)
Patients guardian called to ask if the patient can reduce his Benefiber intake to at least PRN because it is too much for him. ?

## 2021-10-31 MED ORDER — BENEFIBER ON THE GO PO PACK: 1.0000 | PACK | Freq: Three times a day (TID) | ORAL | 11 refills | Status: AC | PRN

## 2021-10-31 NOTE — Telephone Encounter (Signed)
Spoke with patient's guardian and told her it is okay for him to decrease Benefiber to as needed. ?

## 2021-11-03 ENCOUNTER — Ambulatory Visit (INDEPENDENT_AMBULATORY_CARE_PROVIDER_SITE_OTHER): Payer: Medicare Other | Admitting: Podiatry

## 2021-11-03 DIAGNOSIS — S91209A Unspecified open wound of unspecified toe(s) with damage to nail, initial encounter: Secondary | ICD-10-CM

## 2021-11-06 NOTE — Progress Notes (Signed)
?  Subjective:  ?Patient ID: [name] Branch, male    DOB: [birth year],  MRN: [id number] ? ?Chief Complaint  ?Patient presents with  ? Nail Problem  ?  2 week nail check  ? ? ?56 y.o. male presents with the above complaint. History confirmed with patient.  Returns today for follow-up of his nail removal ? ?Objective:  ?Physical Exam: ?warm, good capillary refill, normal DP and PT pulses, and left hallux nail avulsion site is healing well no signs of infection ? ?Assessment:  ? ?1. Traumatic avulsion of nail plate of toe, initial encounter   ? ? ? ? ?Plan:  ?Patient was evaluated and treated and all questions answered. ? ?Avulsion site seems to be healing well.  Advised the nail will regrow and if he has any issues with that he will return to see me as needed. ? ?Return if symptoms worsen or fail to improve.  ? ?

## 2021-11-11 ENCOUNTER — Other Ambulatory Visit: Payer: Self-pay

## 2021-11-11 ENCOUNTER — Ambulatory Visit
Admission: RE | Admit: 2021-11-11 | Discharge: 2021-11-11 | Disposition: A | Discharge status: Home / Self Care | Payer: Medicare Other | Source: Ambulatory Visit | Attending: Radiation Oncology | Admitting: Radiation Oncology

## 2021-11-11 ENCOUNTER — Encounter: Payer: Self-pay | Admitting: Radiation Oncology

## 2021-11-11 VITALS — BP 133/93 | HR 100 | Temp 98.3°F | Resp 0 | Wt 264.2 lb

## 2021-11-11 DIAGNOSIS — C050 Malignant neoplasm of hard palate: Secondary | ICD-10-CM

## 2021-11-11 DIAGNOSIS — Z79899 Other long term (current) drug therapy: Secondary | ICD-10-CM | POA: Insufficient documentation

## 2021-11-11 DIAGNOSIS — Z85819 Personal history of malignant neoplasm of unspecified site of lip, oral cavity, and pharynx: Secondary | ICD-10-CM | POA: Insufficient documentation

## 2021-11-11 DIAGNOSIS — Z1329 Encounter for screening for other suspected endocrine disorder: Secondary | ICD-10-CM | POA: Insufficient documentation

## 2021-11-11 DIAGNOSIS — Z923 Personal history of irradiation: Secondary | ICD-10-CM | POA: Insufficient documentation

## 2021-11-11 LAB — TSH: TSH: 0.771 u[IU]/mL (ref 0.320–4.118)

## 2021-11-11 NOTE — Progress Notes (Signed)
[name] Branch presents today for follow-up after completing radiation to his palate on 06/16/2020 ? ?Pain issues, if any: Patient and sister/caregiver both deny ?Using a feeding tube?: N/A ?Weight changes, if any:  ?Wt Readings from Last 3 Encounters:  ?11/11/21 264 lb 3.2 oz (119.8 kg)  ?08/26/21 254 lb (115.2 kg)  ?07/26/21 272 lb 14.9 oz (123.8 kg)  ? ?Swallowing issues, if any: Sister reports he sometimes seems to grimace when drinking fluids (Denies any noticeable issues with solid foods). She plans to reach out to SLP office [name]) to sent up an appointment for re-evaluation ?Smoking or chewing tobacco? None ?Using fluoride trays daily? N/A--sister reports he sees his dentist frequently. Next appointment scheduled for 11/22/21 ?Last ENT visit was on: 08/08/2021 Saw [name] ([facility]) ?Plan:  ?No new clinical evidence of locoregional recurrence  ?Continue oral hygiene and routine dental care ?Will see [name] in April 2022 ?It was my recommendation today that the patient speak with [name] at his next surveillance visit to determine further surveillance given my imminent departure from [place].  ?I told him that it may be reasonable to continue postoperative care/surveillance only with [name] or that they may recommend a surgical oncologist to also surveill [name] through [facility] health. ?Previously used Mucinex D for nasal congestion with good results; I encouraged that this could be used again in the future. ? ?Other notable issues, if any: Overall patient reports he's dong well, and sister reports he seems to be in good spirits and well recovered ? ? ? ? ?

## 2021-11-11 NOTE — Progress Notes (Signed)
?Radiation Oncology         [phone] ?________________________________ ? ?Name: [name] Branch MRN: [id number]  ?Date: 11/11/2021  DOB: [birth year] ? ?Follow-Up Visit Note ? ?CC: [name], MD  Ladell Pier, MD ? ?Diagnosis and Prior Radiotherapy:     ?  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  Amb Referral to Survivorship Program  ?  SLP modified barium swallow  ?  ?2. Screening for hypothyroidism  Z13.29 TSH  ?  ? ? Cancer Staging  ?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HCC) ?Staging form: Oral Cavity, AJCC 8th Edition ?- Pathologic stage from 04/28/2020: Stage Unknown (pT2, pNX, cM0) - Signed by Eppie Gibson, MD on 05/03/2020 ?Stage prefix: Initial diagnosis ? ? ?Radiation Treatment Dates: 05/20/2020 through 06/16/2020 ?Site Technique Total Dose (Gy) Dose per Fx (Gy) Completed Fx Beam Energies  ?Neck: HN_palate_necknodes IMRT 51/51 2.55 20/20 6X  ? ?CHIEF COMPLAINT:  Here for follow-up and surveillance of palate cancer ? ?Narrative:     [name] Branch presents today for follow-up after completing radiation to his palate on 06/16/2020 ? ?Pain issues, if any: Patient and sister/caregiver both deny ?Using a feeding tube?: N/A ?Weight changes, if any:  ?Wt Readings from Last 3 Encounters:  ?11/11/21 264 lb 3.2 oz (119.8 kg)  ?08/26/21 254 lb (115.2 kg)  ?07/26/21 272 lb 14.9 oz (123.8 kg)  ? ?Swallowing issues, if any: Sister reports he sometimes seems to grimace when drinking fluids and some coughing after eating and drinking.  He seems to have some residual solids in his mouth when he is done eating ( She plans to reach out to SLP office [name]) to sent up an appointment for re-evaluation ?Smoking or chewing tobacco? None ?Using fluoride trays daily? N/A--sister reports he sees his dentist frequently. Next appointment scheduled for 11/22/21 ?Last ENT visit was on: 08/08/2021 Saw [name] ([facility]) ?Plan:  ?No new clinical evidence of locoregional recurrence  ?Continue oral hygiene and routine dental care ?Will see [name] in April 2022 ?It was my recommendation today that the patient speak with [name] at his next surveillance visit to determine further surveillance given my imminent departure from [place].  ?I told him that it may be reasonable to continue postoperative care/surveillance only with [name] or that they may recommend a surgical oncologist to also surveill [name] Branch through [facility] health. ?Previously used Mucinex D for nasal congestion with good results; I encouraged that this could be used again in the future. ? ?Other notable issues, if any: Overall patient reports he's dong well, and sister reports he seems to be in good spirits and well recovered ? ? ? ?Vitals:  ? 11/11/21 1124  ?BP: (!) 133/93  ?Pulse: 100  ?Resp: (!) 0  ?Temp: 98.3 ?F (36.8 ?C)  ?SpO2: 98%  ? ? ?ALLERGIES:  is allergic to hydrocodone. ? ?Meds: ?Current Outpatient Medications  ?Medication Sig Dispense Refill  ? albuterol (PROVENTIL) (2.5 MG/3ML) 0.083% nebulizer solution Take 2.5 mg by nebulization every 6 (six) hours as needed for wheezing or shortness of breath.    ? albuterol (VENTOLIN HFA) 108 (90 Base) MCG/ACT inhaler Inhale 2 puffs into the lungs every 6 (six) hours as needed for wheezing or shortness of breath.    ? carbamide peroxide (DEBROX) 6.5 % OTIC solution 5 drops daily as needed (ear wax removal).    ? carboxymethylcellulose (REFRESH PLUS) 0.5 %  SOLN 1 drop at bedtime. Gel    ? cetirizine (ZYRTEC) 10 MG tablet Take 10 mg by mouth daily.    ? cloZAPine (CLOZARIL) 100 MG tablet Take 200 mg by mouth See admin instructions. Take with 50 mg for a total of 250 mg at bedtime    ? clozapine (CLOZARIL) 50 MG tablet Take 50 mg by mouth See admin instructions. Take with (2) 100 mg for a total of 250 mg at bedtime    ? Dextromethorphan-guaiFENesin (MUCINEX DM) 30-600 MG TB12 Take 1 tablet by mouth  daily as needed (Congestion). 60-600    ? FARXIGA 10 MG TABS tablet Take 10 mg by mouth daily.    ? fluticasone (FLONASE) 50 MCG/ACT nasal spray Place into both nostrils.    ? fluvoxaMINE (LUVOX) 100 MG tablet Take 300 mg by mouth at bedtime.    ? ketoconazole (NIZORAL) 2 % cream Apply 1 application topically daily.    ? latanoprost (XALATAN) 0.005 % ophthalmic solution Place 1 drop into both eyes nightly.    ? metoprolol succinate (TOPROL-XL) 50 MG 24 hr tablet Take 1 tablet (50 mg total) by mouth in the morning. Hold if top blood pressure number less than 100 mmHg or heart rate less than 60 bpm (pulse). 90 tablet 1  ? Multiple Vitamin (MULTIVITAMIN) capsule Take 1 capsule by mouth daily. 90 capsule 3  ? pravastatin (PRAVACHOL) 40 MG tablet TAKE ONE TABLET EACH DAY 90 tablet 3  ? REFRESH OPTIVE 1-0.9 % GEL Apply 1 drop to eye as directed.    ? sacubitril-valsartan (ENTRESTO) 24-26 MG Take 1 tablet by mouth 2 (two) times daily. 180 tablet 0  ? sodium chloride (OCEAN) 0.65 % nasal spray Place 1 spray into the nose as needed for congestion.    ? SODIUM FLUORIDE 5000 PLUS 1.1 % CREA dental cream SMARTSIG:Topical    ? tiotropium (SPIRIVA) 18 MCG inhalation capsule Place 18 mcg into inhaler and inhale daily.    ? Wheat Dextrin (BENEFIBER ON THE GO) PACK Take 1 packet by mouth 3 (three) times daily as needed. 320 each 11  ? ?No current facility-administered medications for this encounter.  ? ? ?Physical Findings: ?The patient is in no acute distress. Patient is alert and oriented. ?Wt Readings from Last 3 Encounters:  ?11/11/21 264 lb 3.2 oz (119.8 kg)  ?08/26/21 254 lb (115.2 kg)  ?07/26/21 272 lb 14.9 oz (123.8 kg)  ? ? weight is 264 lb 3.2 oz (119.8 kg). His temperature is 98.3 ?F (36.8 ?C). His blood pressure is 133/93 (abnormal) and his pulse is 100. His respiration is 0 (abnormal) and oxygen saturation is 98%. Marland Kitchen  ?General: Alert, in no acute distress ?HEENT: Mild to moderate exophthalmos and significant erythema of  the eyes bilaterally  ?Head is normocephalic; oral cavity /oropharynx notable for no tumor.  There is residual tissue from reconstruction of the right hard palate, stable and healthy appearing.  He has no thrush.  No trismus. ?Neck: Neck is notable for no palpable masses. ?Skin: Skin in treatment fields shows satisfactory healing    ?MSK: Ambulatory independently ?Heart: Regular rate and rhythm with no murmurs ?Chest clear to auscultation bilaterally ?Abd: soft TD/ND ? ?Lab Findings: ?Lab Results  ?Component Value Date  ? WBC 5.6 05/12/2021  ? HGB 14.8 05/12/2021  ? HCT 45.9 05/12/2021  ? MCV 96.8 05/12/2021  ? PLT 159 05/12/2021  ? ? ?Lab Results  ?Component Value Date  ? TSH 0.771 11/11/2021  ? ?CMP  ?   ?  Component Value Date/Time  ? NA 140 05/12/2021 1620  ? NA 143 10/19/2020 1509  ? K 4.1 05/12/2021 1620  ? CL 105 05/12/2021 1620  ? CO2 28 05/12/2021 1620  ? GLUCOSE 91 05/12/2021 1620  ? BUN 9 05/12/2021 1620  ? BUN 10 10/19/2020 1509  ? CREATININE 1.12 05/12/2021 1620  ? CREATININE 1.01 09/02/2012 1022  ? CALCIUM 9.2 05/12/2021 1620  ? PROT 7.8 09/25/2020 1746  ? PROT 6.8 11/23/2017 1108  ? ALBUMIN 4.2 09/25/2020 1746  ? ALBUMIN 4.3 11/23/2017 1108  ? AST 42 (H) 09/25/2020 1746  ? ALT 63 (H) 09/25/2020 1746  ? ALKPHOS 109 09/25/2020 1746  ? BILITOT 0.7 09/25/2020 1746  ? BILITOT 0.6 11/23/2017 1108  ? GFRNONAA >60 05/12/2021 1620  ? GFRNONAA 89 09/02/2012 1022  ? GFRAA 78 09/13/2020 1524  ? GFRAA >89 09/02/2012 1022  ? ? ?Radiographic Findings: ?No results found. ? ? ?Impression/Plan:   ? 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 ?TSH ordered today  - WNL ?Lab Results  ?Component Value Date  ? TSH 0.771 11/11/2021  ? TSH 0.855 04/01/2021  ? TSH 1.670 12/31/2017  ? TSH 0.956 09/04/2011  ? ?Regarding his swallowing symptoms we will refer him back to SLP with [facility] ? ?Dental: He is following closely with dentistry.  I let the patient's sister know that if he ever needs a dental extraction, his dentist  should call me if that extraction is considered to the right most posterior maxillary molar.  This tooth root was exposed to over [place] while we aggressively molded the radiation away from his other maxil

## 2021-11-14 ENCOUNTER — Telehealth: Payer: Self-pay | Admitting: Nurse Practitioner

## 2021-11-14 NOTE — Telephone Encounter (Signed)
Per 4/14 in basket called and spoke to pt guardian about survivorship appointment.  Pt guardian confirmed appointment  ?

## 2021-11-15 ENCOUNTER — Other Ambulatory Visit (HOSPITAL_COMMUNITY): Payer: Self-pay

## 2021-11-15 DIAGNOSIS — R1310 Dysphagia, unspecified: Secondary | ICD-10-CM

## 2021-11-23 ENCOUNTER — Ambulatory Visit (HOSPITAL_COMMUNITY)
Admission: RE | Admit: 2021-11-23 | Discharge: 2021-11-23 | Disposition: A | Discharge status: Home / Self Care | Payer: Medicare Other | Source: Ambulatory Visit | Attending: Radiation Oncology | Admitting: Radiation Oncology

## 2021-11-23 DIAGNOSIS — R1310 Dysphagia, unspecified: Secondary | ICD-10-CM

## 2021-11-23 DIAGNOSIS — R1312 Dysphagia, oropharyngeal phase: Secondary | ICD-10-CM | POA: Diagnosis present

## 2021-11-23 DIAGNOSIS — C050 Malignant neoplasm of hard palate: Secondary | ICD-10-CM | POA: Diagnosis not present

## 2021-11-28 ENCOUNTER — Telehealth: Payer: Self-pay | Admitting: *Deleted

## 2021-11-28 NOTE — Telephone Encounter (Signed)
[facility] LEFT FORM TO BE FILLED OUT, PER NURSE KATELIN, RN FOR DR. SQUIRE, LVM FOR A RETURN CALL ?

## 2022-02-22 IMAGING — MR MR HEAD WO/W CM
13 series · 48 of 48 positions shown · IV contrast (multihance)
Comparison: MRI head 12/30/2017

CLINICAL DATA: Gait difficulty. Problem with perception. New
diagnosis of salivary gland adenocarcinoma.

EXAM:
MRI HEAD WITHOUT AND WITH CONTRAST
TECHNIQUE: Multiplanar, multiecho pulse sequences of the brain and surrounding
structures were obtained without and with intravenous contrast.
CONTRAST:  20mL MULTIHANCE GADOBENATE DIMEGLUMINE 529 MG/ML IV SOLN

[Series 5: T1 · sagittal · 4.0mm · 0.75mm/px · 1 of 31 slices shown (1 of 3)]
[im 1/31]
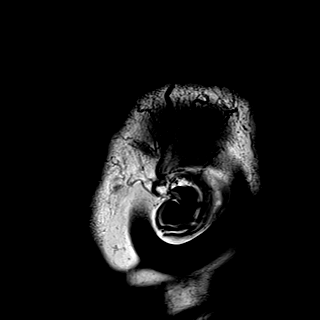

[Series 6: DWI · axial · 3.0mm · 1.56mm/px · z∈[-70,+64]mm · 5 of 84 slices shown (1 of 4)]
[im 1/84]
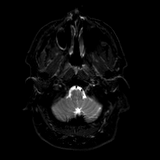
[im 21/84]
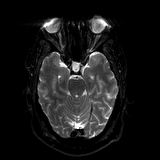
[im 42/84]
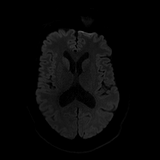
[im 63/84]
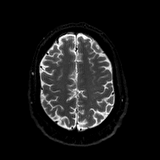
[im 84/84]
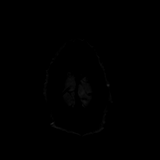

[Series 7: DWI · axial · 3.0mm · 1.56mm/px · z∈[-70,+64]mm · 2 of 42 slices shown (2 of 4)]
[im 1/42]
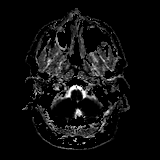
[im 42/42]
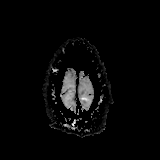

[Series 8: DWI · coronal · 5.0mm · 1.44mm/px · 4 of 60 slices shown (3 of 4)]
[im 1/60]
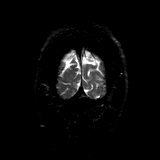
[im 20/60]
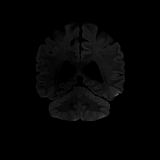
[im 40/60]
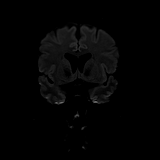
[im 60/60]
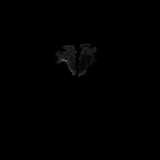

[Series 9: DWI · coronal · 5.0mm · 1.44mm/px · 2 of 30 slices shown (4 of 4)]
[im 1/30]
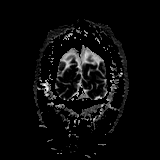
[im 30/30]
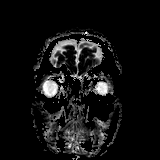

[Series 10: T2 · axial · 4.0mm · 0.36mm/px · z∈[-73,+61]mm · 2 of 27 slices shown]
[im 1/27]
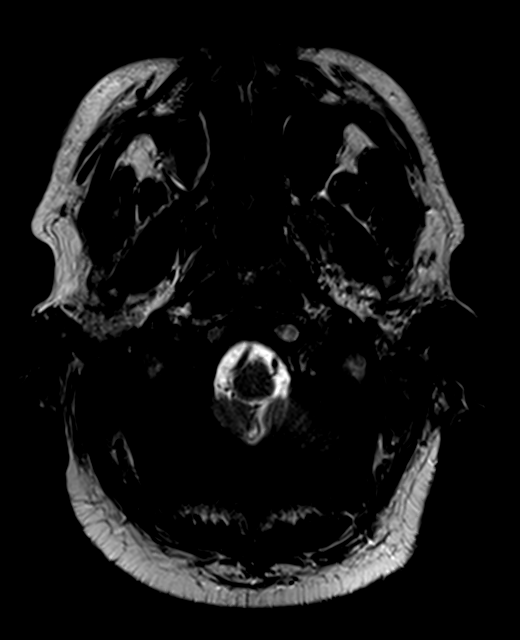
[im 27/27]
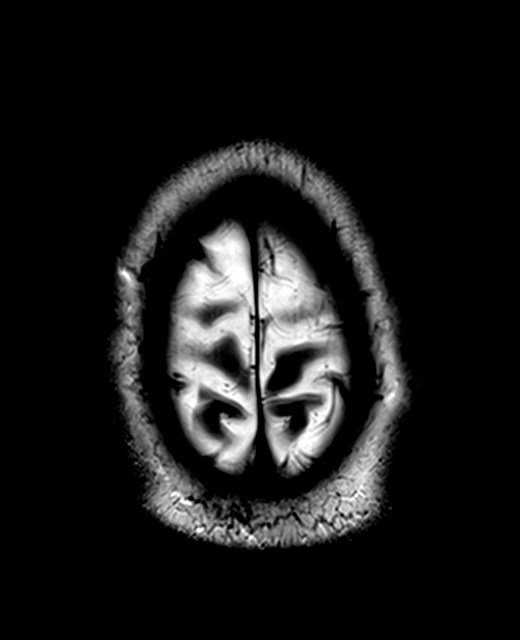

[Series 11: FLAIR · axial · 3.0mm · 0.72mm/px · z∈[-82,+66]mm · 2 of 26 slices shown]
[im 1/26]
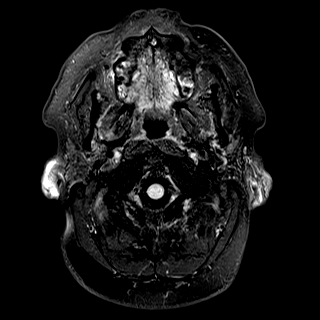
[im 26/26]
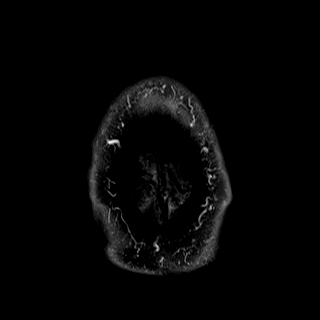

[Series 13: swi_images · axial · 1.5mm · 0.90mm/px · z∈[-76,+65]mm · 6 of 96 slices shown]
[im 1/96]
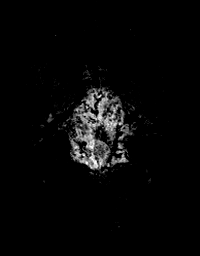
[im 20/96]
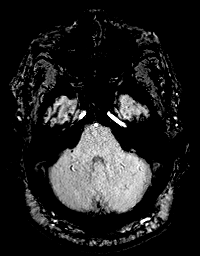
[im 39/96]
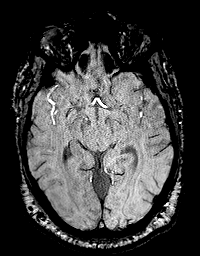
[im 58/96]
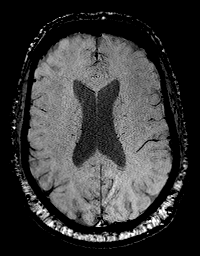
[im 77/96]
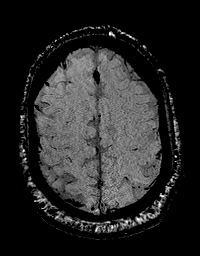
[im 96/96]
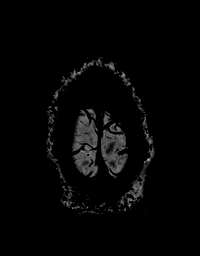

[Series 14: T1 · axial · 1.0mm · 0.94mm/px · z∈[-93,+65]mm · 9 of 160 slices shown (2 of 3)]
[im 1/160]
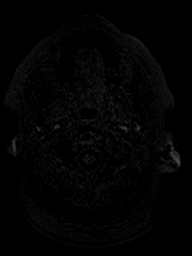
[im 20/160]
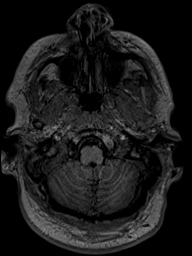
[im 40/160]
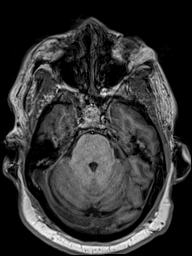
[im 60/160]
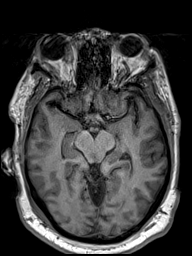
[im 80/160]
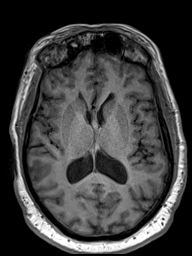
[im 100/160]
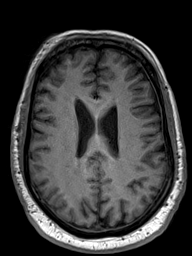
[im 120/160]
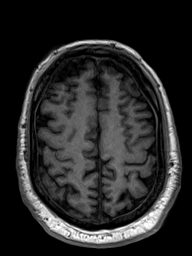
[im 140/160]
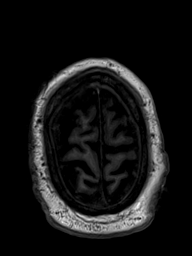
[im 160/160]
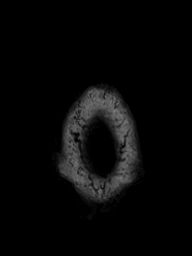

[Series 15: T2 post-contrast · coronal · 4.5mm · 0.36mm/px · 2 of 35 slices shown]
[im 1/35]
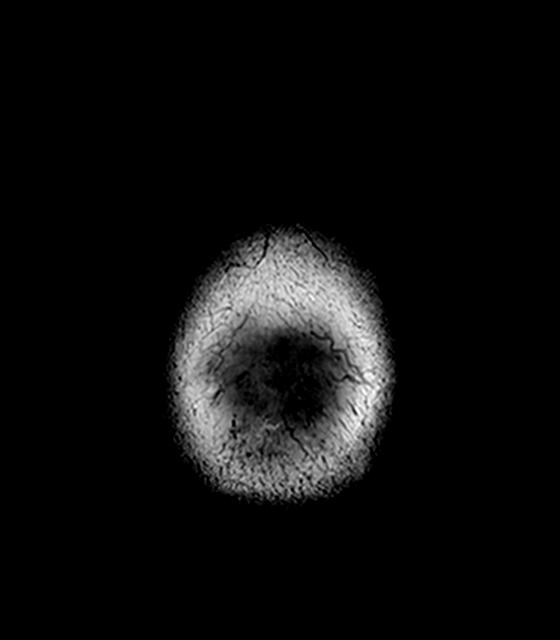
[im 35/35]
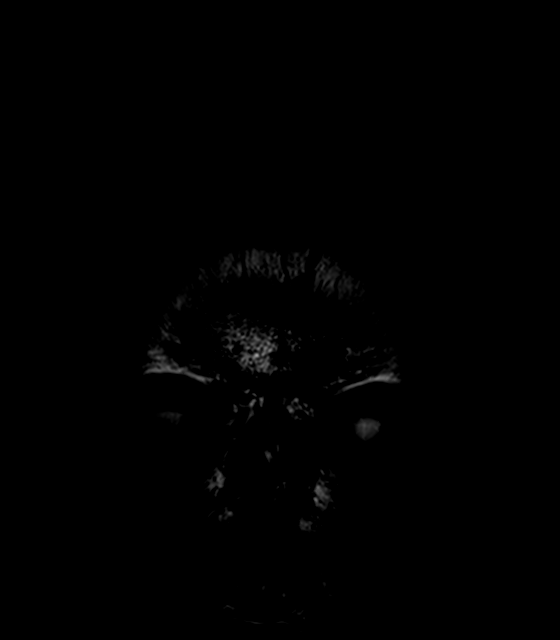

[Series 16: T1 · axial · 1.0mm · 0.94mm/px · z∈[-93,+65]mm · 9 of 160 slices shown (3 of 3)]
[im 1/160]
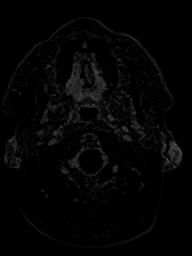
[im 20/160]
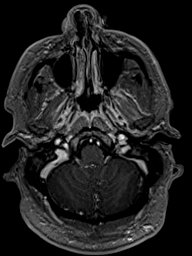
[im 40/160]
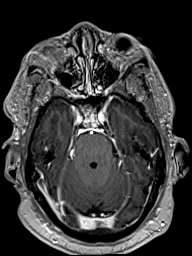
[im 60/160]
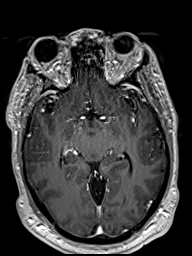
[im 80/160]
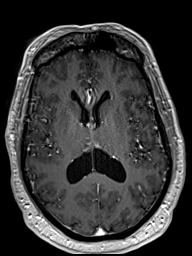
[im 100/160]
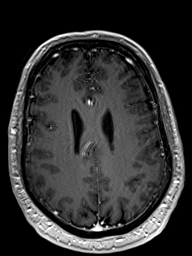
[im 120/160]
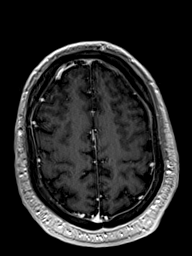
[im 140/160]
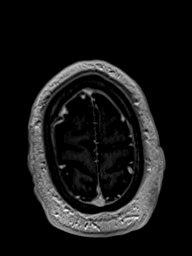
[im 160/160]
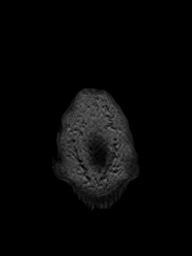

[Series 17: T1 post-contrast · coronal · 4.5mm · 0.72mm/px · 2 of 35 slices shown (1 of 2)]
[im 1/35]
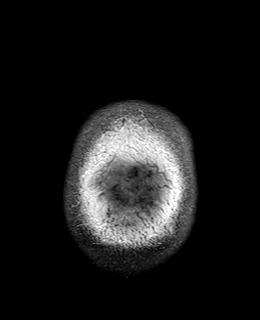
[im 35/35]
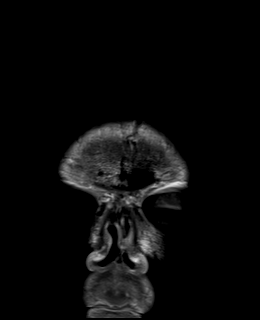

[Series 18: T1 post-contrast · sagittal · 4.0mm · 0.75mm/px · 2 of 31 slices shown (2 of 2)]
[im 1/31]
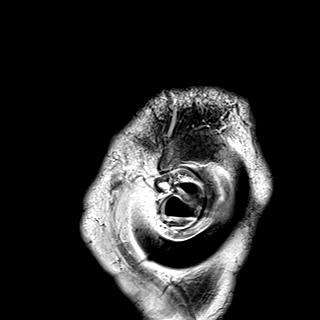
[im 31/31]
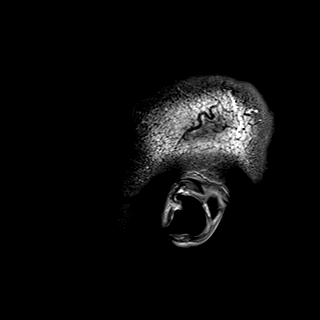

[48 of 48 positions shown; findings below may reference images not displayed]

FINDINGS: Brain: No acute infarction, hemorrhage, hydrocephalus, extra-axial
collection or mass lesion. Normal enhancement pattern of the brain.

Vascular: Normal arterial flow voids.

Skull and upper cervical spine: Normal calvarium

Sinuses/Orbits: Mild mucosal edema paranasal sinuses.

Negative orbit

Enhancing mass lesion in the roof of the mouth on the right. This is
incompletely evaluated but is consistent with neoplasm. Correlate
with physical exam.

Other: None
IMPRESSION: Normal MRI of the brain with contrast

Mass lesion roof of mouth on the right compatible with neoplasm.

## 2022-02-23 ENCOUNTER — Ambulatory Visit: Payer: Medicare Other | Admitting: Cardiology

## 2022-02-23 ENCOUNTER — Encounter: Payer: Self-pay | Admitting: Cardiology

## 2022-02-23 VITALS — BP 135/75 | HR 93 | Temp 97.9°F | Resp 17 | Ht 74.0 in | Wt 256.2 lb

## 2022-02-23 DIAGNOSIS — I5032 Chronic diastolic (congestive) heart failure: Secondary | ICD-10-CM

## 2022-02-23 DIAGNOSIS — E782 Mixed hyperlipidemia: Secondary | ICD-10-CM

## 2022-02-23 DIAGNOSIS — F209 Schizophrenia, unspecified: Secondary | ICD-10-CM

## 2022-02-23 DIAGNOSIS — I4510 Unspecified right bundle-branch block: Secondary | ICD-10-CM

## 2022-02-23 DIAGNOSIS — I429 Cardiomyopathy, unspecified: Secondary | ICD-10-CM

## 2022-02-23 MED ORDER — ENTRESTO 24-26 MG PO TABS: 1.0000 | ORAL_TABLET | Freq: Two times a day (BID) | ORAL | 0 refills | Status: AC

## 2022-02-23 NOTE — Progress Notes (Signed)
ID:  [name] Branch, DOB 09-Jul-1966, MRN [id number]  PCP:  [name] Han, MD (Inactive)  Cardiologist: [name] Branch, [facility] (established care 07/12/2020) Former Cardiology Providers: Dr. Cloretta Branch and [name] Flake PA.  Date: 02/23/22 Last Office Visit: 08/26/2021  Chief Complaint  Patient presents with   Follow-up    6 month   Congestive Heart Failure    HPI  [name] Branch is a 56 y.o. 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  Patient was referred to the practice for evaluation and management of cardiomyopathy and chronic heart failure. Given his intellectual disability and schizophrenia his is accompanied by his guardian [name] Branch (sister / guardian) [phone].    He was formally under the care of Dr. Edwin Branch at [facility] and referred to our practice in December 2021 for management of cardiomyopathy presumed to be nonischemic and chronic combined systolic and diastolic heart failure.  Based on EMR patient is noted to have cardiomyopathy since 2016 at the time of colon surgery.  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 Patient's sister states that he subsequently underwent surgery and completed radiation treatment as of 06/16/2020. Since establishing care with myself his GDMT has been uptitrated in a stepwise fashion and he underwent a repeat echocardiogram which notes preserved LVEF of 50-55% and grade 1 diastolic impairment.    Patient presents today for 10-monthfollow-up visit.  He denies angina pectoris or heart failure symptoms.  His weight remains relatively  stable.  No hospitalizations or urgent care visits for cardiovascular symptoms since last office encounter.    FUNCTIONAL STATUS: Able to ambulate 340mutes five day a week.   ALLERGIES: Allergies  Allergen Reactions   Hydrocodone Other (See Comments)    Exacerbation of urinary and fecal incontinence    MEDICATION LIST PRIOR TO VISIT: Current Meds  Medication Sig   albuterol (PROVENTIL) (2.5 MG/3ML) 0.083% nebulizer solution Take 2.5 mg by nebulization every 6 (six) hours as needed for wheezing or shortness of breath.   albuterol (VENTOLIN HFA) 108 (90 Base) MCG/ACT inhaler Inhale 2 puffs into the lungs every 6 (six) hours as needed for wheezing or shortness of breath.   carbamide peroxide (DEBROX) 6.5 % OTIC solution 5 drops daily as needed (ear wax removal).   carboxymethylcellulose (REFRESH PLUS) 0.5 % SOLN 1 drop at bedtime. Gel   cetirizine (ZYRTEC) 10 MG tablet Take 10 mg by mouth daily.   cloZAPine (CLOZARIL) 100 MG tablet Take 200 mg by mouth See admin instructions. Take with 50 mg for a total of 250 mg at bedtime   clozapine (CLOZARIL) 50 MG tablet Take 50 mg by mouth See admin instructions. Take with (2) 100 mg for a total of 250 mg at bedtime   FARXIGA 10 MG TABS tablet Take 10 mg by mouth daily.   fluticasone (FLONASE) 50 MCG/ACT nasal spray Place into both nostrils.   fluvoxaMINE (LUVOX) 100 MG tablet Take 300 mg by mouth at bedtime.   ketoconazole (NIZORAL) 2 % cream Apply 1 application topically daily.   latanoprost (XALATAN) 0.005 % ophthalmic solution Place 1 drop into both eyes nightly.   metoprolol succinate (TOPROL-XL) 50 MG 24 hr tablet Take 1 tablet (50 mg total) by mouth  in the morning. Hold if top blood pressure number less than 100 mmHg or heart rate less than 60 bpm (pulse).   Multiple Vitamin (MULTIVITAMIN) capsule Take 1 capsule by mouth daily.   pravastatin (PRAVACHOL) 40 MG tablet TAKE ONE TABLET EACH DAY   REFRESH OPTIVE 1-0.9 % GEL Apply 1 drop to eye as  directed.   sacubitril-valsartan (ENTRESTO) 24-26 MG Take 1 tablet by mouth 2 (two) times daily.   sodium chloride (OCEAN) 0.65 % nasal spray Place 1 spray into the nose as needed for congestion.   SODIUM FLUORIDE 5000 PLUS 1.1 % CREA dental cream SMARTSIG:Topical   tiotropium (SPIRIVA) 18 MCG inhalation capsule Place 18 mcg into inhaler and inhale daily.   Wheat Dextrin (BENEFIBER ON THE GO) PACK Take 1 packet by mouth 3 (three) times daily as needed.   [DISCONTINUED] ENTRESTO 49-51 MG Take 1 tablet by mouth 2 (two) times daily.     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  Qualifier: Diagnosis of  By: [name] Scheuermann MD, [name] Branch     Obstruction of colon ([place])    recurrent   Sarcoidosis    Schizophrenia ([place])    Sleep apnea    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   Total self-care deficit    per caregiver he needs to have help bathing and cleaning self and needs help with all daily needs now     PAST SURGICAL HISTORY: Past Surgical History:  Procedure Laterality Date   BALLOON DILATION N/A 07/26/2021   Procedure: BALLOON DILATION;  Surgeon: [name] Pole, MD;  Location: [facility];  Service: Endoscopy;  Laterality: N/A;   BIOPSY  07/26/2021   Procedure: BIOPSY;  Surgeon: [name] Pole, MD;  Location: [facility];  Service: Endoscopy;;   COLECTOMY N/A 09/25/2014   Procedure: OPEN ABDOMINAL COLECTOMY;  Surgeon: [name] Boston, MD;  Location: [facility];   Service: General;  Laterality: N/A;   COLONOSCOPY  2012   FLEXIBLE SIGMOIDOSCOPY N/A 07/26/2021   Procedure: FLEXIBLE SIGMOIDOSCOPY;  Surgeon: [name] Pole, MD;  Location: [facility];  Service: Endoscopy;  Laterality: N/A;   groin surgery     boil drained   MOUTH SURGERY     PROCTOSCOPY N/A 09/25/2014   Procedure: RIGID PROCTOSCOPY;  Surgeon: [name] Boston, MD;  Location: [facility];  Service: General;  Laterality: N/A;    FAMILY HISTORY: The patient family history includes Diabetes in his mother.  SOCIAL HISTORY:  The patient  reports that he has never smoked. He has never used smokeless tobacco. He reports that he does not drink alcohol and does not use drugs.  REVIEW OF SYSTEMS: Review of Systems  Constitutional: Negative for chills and fever.  HENT:  Negative for hoarse voice and nosebleeds.   Eyes:  Negative for discharge, double vision and pain.  Cardiovascular:  Negative for chest pain,  claudication, dyspnea on exertion, leg swelling, near-syncope, orthopnea, palpitations, paroxysmal nocturnal dyspnea and syncope.  Respiratory:  Negative for hemoptysis and shortness of breath.   Musculoskeletal:  Negative for muscle cramps and myalgias.  Gastrointestinal:  Negative for abdominal pain, constipation, diarrhea, hematemesis, hematochezia, melena, nausea and vomiting.  Neurological:  Negative for dizziness and light-headedness.   PHYSICAL EXAM:    02/23/2022    3:34 PM 11/11/2021   11:24 AM 08/26/2021   12:59 PM  Vitals with BMI  Height '6\' 2"'$   '6\' 2"'$   Weight 256 lbs 3 oz 264 lbs 3 oz 254 lbs  BMI 10.17  51.0  Systolic 258 527 782  Diastolic 75 93 70  Pulse 93 100 93    CONSTITUTIONAL: Appears older than stated age, hemodynamically stable, no acute distress.  SKIN: Skin is warm and dry. No rash noted. No cyanosis. No pallor. No jaundice HEAD: Normocephalic and atraumatic.  EYES: No scleral icterus MOUTH/THROAT: Moist oral membranes.  NECK: No JVD present. No  thyromegaly noted. No carotid bruits  CHEST Normal respiratory effort. No intercostal retractions  LUNGS: Clear to auscultation bilaterally. No stridor. No wheezes. No rales.  CARDIOVASCULAR: Regular, tachycardia, positive S1-S2, no murmurs rubs or gallops appreciated. ABDOMINAL: No apparent ascites.  EXTREMITIES: No peripheral edema, 2+ dorsalis pedis and posterior tibial pulses. HEMATOLOGIC: No significant bruising NEUROLOGIC: Oriented to person, place, and time. Nonfocal. Normal muscle tone.  PSYCHIATRIC: Normal mood and affect. Normal behavior. Cooperative  CARDIAC DATABASE: EKG: 02/23/2022: Sinus rhythm, 90 bpm, left axis, right bundle branch block  Echocardiogram: Echo 03/10/2020 and [facility] Everywhere). MODERATE LV DYSFUNCTION EF 35%, LV normal size NORMAL RIGHT VENTRICULAR SYSTOLIC FUNCTION VALVULAR REGURGITATION: MILD AR, TRIVIAL MR, TRIVIAL PR, TRIVIAL TR NO VALVULAR STENOSIS  12/06/2020: Left ventricle cavity is normal in size. Mild concentric hypertrophy of the left ventricle. Normal global wall motion. Normal LV systolic function with EF 50-55%. Doppler evidence of grade I (impaired) diastolic dysfunction, normal LAP.  Structurally normal trileaflet aortic valve. Mild (Grade I) aortic regurgitation. Mild tricuspid regurgitation.  No evidence of pulmonary hypertension.  Stress Testing: No results found for this or any previous visit from the past 1095 days.  Heart Catheterization: None  LABORATORY DATA:    Latest Ref Rng & Units 05/12/2021    6:20 PM 09/25/2020    5:46 PM 07/28/2020   10:34 PM  CBC  WBC 4.0 - 10.5 K/uL 5.6  4.5  4.1   Hemoglobin 13.0 - 17.0 g/dL 14.8  13.8  14.7   Hematocrit 39.0 - 52.0 % 45.9  43.3  48.7   Platelets 150 - 400 K/uL 159  145  161        Latest Ref Rng & Units 05/12/2021    4:20 PM 10/19/2020    3:09 PM 09/25/2020    5:46 PM  CMP  Glucose 70 - 99 mg/dL 91  97  90   BUN 6 - 20 mg/dL '9  10  16   '$ Creatinine 0.61 - 1.24 mg/dL  1.12  1.06  1.24   Sodium 135 - 145 mmol/L 140  143  136   Potassium 3.5 - 5.1 mmol/L 4.1  4.5  4.1   Chloride 98 - 111 mmol/L 105  102  97   CO2 22 - 32 mmol/L '28  24  30   '$ Calcium 8.9 - 10.3 mg/dL 9.2  9.1  9.5   Total Protein 6.5 - 8.1 g/dL   7.8   Total Bilirubin 0.3 - 1.2  mg/dL   0.7   Alkaline Phos 38 - 126 U/L   109   AST 15 - 41 U/L   42   ALT 0 - 44 U/L   63     Lipid Panel     Component Value Date/Time   CHOL 133 03/25/2018 1155   TRIG 64 03/25/2018 1155   HDL 59 03/25/2018 1155   CHOLHDL 2.3 03/25/2018 1155   CHOLHDL 3.4 02/25/2014 1034   VLDL 11 02/25/2014 1034   LDLCALC 61 03/25/2018 1155   LABVLDL 13 03/25/2018 1155    No components found for: "NTPROBNP" No results for input(s): "PROBNP" in the last 8760 hours.  Recent Labs    04/01/21 1221 11/11/21 1217  TSH 0.855 0.771    BMP Recent Labs    05/12/21 1620  NA 140  K 4.1  CL 105  CO2 28  GLUCOSE 91  BUN 9  CREATININE 1.12  CALCIUM 9.2  GFRNONAA >60    HEMOGLOBIN A1C Lab Results  Component Value Date   HGBA1C 6.0 (H) 09/18/2014   MPG 126 09/18/2014    IMPRESSION:    ICD-10-CM   1. Chronic heart failure with preserved ejection fraction (HFpEF) (HCC)  I50.32 EKG 12-Lead    sacubitril-valsartan (ENTRESTO) 24-26 MG    2. Recovered cardiomyopathy  I42.9     3. RBBB  I45.10     4. Mixed hyperlipidemia  E78.2     5. Schizophrenia, unspecified type ([place])  F20.9        RECOMMENDATIONS: [name] Branch is a 56 y.o. 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  Chronic heart failure with preserved EF, NYHA class II, stage B: Echo 02/2020: LVEF 35%. Echo 11/2020: LVEF  50-55%. No recent hospitalizations or urgent care visits for CHF. Euvolemic and not in congestive heart failure. Entresto dose is 49/51 mg half a tablet twice daily.  We will transition him to Entresto 24/26 mg p.o. twice daily. Recommended up titration of GDMT; however, the shared decision was to continue current medical therapy as in the past he has had issues with hypotension with up titration of Entresto and addition of spironolactone. Educated the importance of low-salt diet, 30 minutes of moderate intensity exercise 5 days a week, and improving his modifiable cardiovascular risk factors.  Recovered cardiomyopathy: See above.   Hyperlipidemia, mixed: Continue statin.  Currently managed by primary care provider.  RBBB: Continue monitor.   Patient has an upcoming physical with PCP on March 03, 2022.  I have asked him to send [place] a copy of his labs for reference.  Plan of care discussed with the patient and his sister at today's office visit.  FINAL MEDICATION LIST END OF ENCOUNTER:   Current Outpatient Medications:    albuterol (PROVENTIL) (2.5 MG/3ML) 0.083% nebulizer solution, Take 2.5 mg by nebulization every 6 (six) hours as needed for wheezing or shortness of breath., Disp: , Rfl:    albuterol (VENTOLIN HFA) 108 (90 Base) MCG/ACT inhaler, Inhale 2 puffs into the lungs every 6 (six) hours as needed for wheezing or shortness of breath., Disp: , Rfl:    carbamide peroxide (DEBROX) 6.5 % OTIC solution, 5 drops daily as needed (ear wax removal)., Disp: , Rfl:    carboxymethylcellulose (REFRESH PLUS) 0.5 % SOLN, 1  drop at bedtime. Gel, Disp: , Rfl:    cetirizine (ZYRTEC) 10 MG tablet, Take 10 mg by mouth daily., Disp: , Rfl:    cloZAPine (CLOZARIL) 100 MG tablet, Take 200 mg by mouth See admin instructions. Take with 50 mg for a total of 250 mg at bedtime, Disp: , Rfl:    clozapine (CLOZARIL) 50 MG tablet, Take 50 mg by mouth See admin instructions. Take with (2) 100 mg for a total of 250  mg at bedtime, Disp: , Rfl:    FARXIGA 10 MG TABS tablet, Take 10 mg by mouth daily., Disp: , Rfl:    fluticasone (FLONASE) 50 MCG/ACT nasal spray, Place into both nostrils., Disp: , Rfl:    fluvoxaMINE (LUVOX) 100 MG tablet, Take 300 mg by mouth at bedtime., Disp: , Rfl:    ketoconazole (NIZORAL) 2 % cream, Apply 1 application topically daily., Disp: , Rfl:    latanoprost (XALATAN) 0.005 % ophthalmic solution, Place 1 drop into both eyes nightly., Disp: , Rfl:    metoprolol succinate (TOPROL-XL) 50 MG 24 hr tablet, Take 1 tablet (50 mg total) by mouth in the morning. Hold if top blood pressure number less than 100 mmHg or heart rate less than 60 bpm (pulse)., Disp: 90 tablet, Rfl: 1   Multiple Vitamin (MULTIVITAMIN) capsule, Take 1 capsule by mouth daily., Disp: 90 capsule, Rfl: 3   pravastatin (PRAVACHOL) 40 MG tablet, TAKE ONE TABLET EACH DAY, Disp: 90 tablet, Rfl: 3   REFRESH OPTIVE 1-0.9 % GEL, Apply 1 drop to eye as directed., Disp: , Rfl:    sacubitril-valsartan (ENTRESTO) 24-26 MG, Take 1 tablet by mouth 2 (two) times daily., Disp: 180 tablet, Rfl: 0   sodium chloride (OCEAN) 0.65 % nasal spray, Place 1 spray into the nose as needed for congestion., Disp: , Rfl:    SODIUM FLUORIDE 5000 PLUS 1.1 % CREA dental cream, SMARTSIG:Topical, Disp: , Rfl:    tiotropium (SPIRIVA) 18 MCG inhalation capsule, Place 18 mcg into inhaler and inhale daily., Disp: , Rfl:    Wheat Dextrin (BENEFIBER ON THE GO) PACK, Take 1 packet by mouth 3 (three) times daily as needed., Disp: 320 each, Rfl: 11  Orders Placed This Encounter  Procedures   EKG 12-Lead    There are no Patient Instructions on file for this visit.   --Continue cardiac medications as reconciled in final medication list. --Return in about 6 months (around 08/26/2022) for Follow up HFpEF. Or sooner if needed. --Continue follow-up with your primary care physician regarding the management of your other chronic comorbid conditions.  Patient's  questions and concerns were addressed to his satisfaction. He voices understanding of the instructions provided during this encounter.   This note was created using a voice recognition software as a result there may be grammatical errors inadvertently enclosed that Branch not reflect the nature of this encounter. Every attempt is made to correct such errors.  [name] Branch, [place], [facility]  Pager: [phone] Office: [phone]

## 2022-03-06 NOTE — Progress Notes (Signed)
External Labs: Collected: 12/29/2021 BUN 11, creatinine 1.28 mg/dL. eGFR 66. Sodium 141, potassium 4.6, chloride 103, bicarb 26. A1c 6.5

## 2022-04-05 ENCOUNTER — Ambulatory Visit: Payer: Medicare Other | Attending: Family Medicine

## 2022-04-05 ENCOUNTER — Other Ambulatory Visit: Payer: Self-pay

## 2022-04-05 DIAGNOSIS — M25661 Stiffness of right knee, not elsewhere classified: Secondary | ICD-10-CM | POA: Insufficient documentation

## 2022-04-05 DIAGNOSIS — M6281 Muscle weakness (generalized): Secondary | ICD-10-CM | POA: Diagnosis present

## 2022-04-05 DIAGNOSIS — M25662 Stiffness of left knee, not elsewhere classified: Secondary | ICD-10-CM | POA: Insufficient documentation

## 2022-04-05 DIAGNOSIS — R262 Difficulty in walking, not elsewhere classified: Secondary | ICD-10-CM | POA: Insufficient documentation

## 2022-04-05 NOTE — Therapy (Signed)
OUTPATIENT PHYSICAL THERAPY LOWER EXTREMITY EVALUATION   Patient Name: [name] Branch MRN: [id number] DOB:1966/04/04, 56 y.o., male Today's Date: 04/06/2022   PT End of Session - 04/06/22 0600     Visit Number 1    Number of Visits 13    Date for PT Re-Evaluation 05/26/22    Authorization Type UHC MEDICARE, MEDICAID OF [place]    Progress Note Due on Visit 10    PT Start Time 1630    PT Stop Time 1715    PT Time Calculation (min) 45 min    Equipment Utilized During Treatment Gait belt    Activity Tolerance Patient tolerated treatment well    Behavior During Therapy WFL for tasks assessed/performed             Past Medical History:  Diagnosis Date   ABSCESS, ANAL/RECTAL REGIONS 08/18/2006   Annotation: Fournier gangrene Qualifier: History of  By: Garnette Scheuermann MD, Yogesh     Allergy    Asthma    Cancer ([place])    Cardiomyopathy    Cataract    CHF (congestive heart failure) ([place])    Colonic inertia    Constipation    Emphysema of lung ([place])    GERD (gastroesophageal reflux disease)    Glaucoma    Hyperlipidemia    Hypovolemic shock ([place])    Incontinent of feces    Mental retardation    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   Annotation: chronic with stercoral ulcer  Qualifier: Diagnosis of  By: Garnette Scheuermann MD, Yogesh     Obstruction of colon ([place])    recurrent   Sarcoidosis    Schizophrenia ([place])    Sleep apnea    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   Total self-care deficit    per caregiver he needs to have help bathing and cleaning self and needs help with all daily needs now    Past Surgical History:  Procedure Laterality Date   BALLOON DILATION N/A 07/26/2021   Procedure: BALLOON DILATION;  Surgeon: Mauri Pole, MD;  [facility];  Service: Endoscopy;  Laterality: N/A;   BIOPSY  07/26/2021   Procedure: BIOPSY;  Surgeon: Mauri Pole, MD;  [facility];  Service: Endoscopy;;   COLECTOMY N/A 09/25/2014   Procedure: OPEN ABDOMINAL COLECTOMY;  Surgeon:  Michael Boston, MD;  Location: [facility];  Service: General;  Laterality: N/A;   COLONOSCOPY  2012   FLEXIBLE SIGMOIDOSCOPY N/A 07/26/2021   Procedure: FLEXIBLE SIGMOIDOSCOPY;  Surgeon: Mauri Pole, MD;  [facility];  Service: Endoscopy;  Laterality: N/A;   groin surgery     boil drained   MOUTH SURGERY     PROCTOSCOPY N/A 09/25/2014   Procedure: RIGID PROCTOSCOPY;  Surgeon: Michael Boston, MD;  [facility]: [facility];  Service: General;  Laterality: N/A;   Patient Active Problem List   Diagnosis Date Noted   Rectal stricture    Encounter for colorectal cancer screening 02/09/2021   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   Incontinent of feces    Total self-care deficit    S/P total colectomy 07/20/2017   Dizziness 10/12/2016   Anemia of chronic disease 10/05/2014   Colonic inertia s/p abdominal colectomy 09/25/2014 09/25/2014   Hyperlipidemia 09/17/2008   Glaucoma 10/10/2006   Sarcoidosis 06/11/2006   Schizophrenia, unspecified type ([place]) 06/11/2006   Intellectual disability 40/97/3532   Chronic systolic CHF (congestive heart failure) ([place]) 06/11/2006   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  PCP: [name], MD  REFERRING PROVIDER: Michae Kava, MD  REFERRING DIAG: gait instability /balance issues High fall risk  THERAPY DIAG:  Difficulty in walking, not elsewhere classified  Muscle weakness (generalized)  Stiffness of left knee, not elsewhere classified  Stiffness of right knee, not elsewhere classified  Rationale for Evaluation and Treatment Rehabilitation  ONSET DATE: A couple of years  SUBJECTIVE:   SUBJECTIVE STATEMENT: Intake was provided by pt's sister- [name]. CG reports unsteadiness when first getting up after prolonged sitting or after waking. Pt uses a RW in the community c SBA/supervision. Pt does not [place] the RW in the home, but furniture walks as needed.  PERTINENT HISTORY: Mental  retardation, Glaucoma, obesity, Cardiomyopathy, CHF, Schizophrenia, Total self-care deficit   PAIN:  Are you having pain? No  PRECAUTIONS: Fall  WEIGHT BEARING RESTRICTIONS No  FALLS:  Has patient fallen in last 6 months? No  LIVING ENVIRONMENT: Lives with: lives with their family Lives in: House/apartment Stairs: Yes: External: 1 steps; door frame Has following equipment at home: Environmental consultant - 4 wheeled  OCCUPATION: Disability  PLOF: Independent with household mobility without device, Needs assistance with ADLs, Needs assistance with homemaking, and Needs assistance with gait  PATIENT GOALS Improve balance outside the home   OBJECTIVE:   DIAGNOSTIC FINDINGS: none relevant  PATIENT SURVEYS:  NA  COGNITION:  Overall cognitive status: Impaired     SENSATION: WFL  EDEMA:  NT  MUSCLE LENGTH: Hamstrings: Right tight deg; Left tight deg Thomas test: Right tight deg; Left tight deg  POSTURE: increased thoracic kyphosis, anterior pelvic tilt, flexed trunk , and flexed kness  PALPATION: NT  LOWER EXTREMITY ROM:  Active ROM Right eval Left eval  Hip flexion    Hip extension    Hip abduction    Hip adduction    Hip internal rotation    Hip external rotation    Knee flexion    Knee extension -10 -10  Ankle dorsiflexion    Ankle plantarflexion    Ankle inversion    Ankle eversion     (Blank rows = not tested)  LOWER EXTREMITY MMT:  MMT Right eval Left eval  Hip flexion 3- 3-  Hip extension 3- 3-  Hip abduction 3- 3-  Hip adduction    Hip internal rotation    Hip external rotation 3 3  Knee flexion 4 4  Knee extension 4 4  Ankle dorsiflexion    Ankle plantarflexion    Ankle inversion    Ankle eversion     (Blank rows = not tested)  LOWER EXTREMITY SPECIAL TESTS:  NT  FUNCTIONAL TESTS:  5xSTS= 23.8 sec c use of hands on armrest  Quick Balance Tests: Standing 2 apart= 2 mins no unsteadiness  Standing feet together= 2 mins no  unsteadiness  Standing eyes close, feet apart= no unsteadiness for 10 sec SLS = 1 sec bilat  Berg: TBA  Coordination: Decreased pace c finger to nose; Dysdiadochokinesis  GAIT: Distance walked: 281f Assistive device utilized: WEnvironmental consultant- 4 wheeled Level of assistance: SBA Comments: flexed trunk, hips, knees and out toeing   [facility]                                                [facility]22 Therapeutic Exercise: - Supine Bridge  5 reps - 5 hold -  Hooklying Clamshell with Resistance  GTB 10 reps - 3 hold - Supine Straight Leg Raises  5 reps - 3 hold - Sit to Stand with Counter Support  5 reps - 3 hold  PATIENT EDUCATION:  Education details: Eval findings, POC, HEP, self care Person educated: Patient and Caregiver sister Education method: Explanation, Demonstration, Tactile cues, Verbal cues, and Handouts Education comprehension: verbalized understanding, returned demonstration, verbal cues required, and tactile cues required   HOME EXERCISE PROGRAM: Access Code: [email or web address] URL: [email or web address] Date: 04/06/2022 Prepared by: Gar Ponto  Exercises - Supine Bridge  - 1 x daily - 7 x weekly - 2 sets - 10 reps - 5 hold - Hooklying Clamshell with Resistance  - 1 x daily - 7 x weekly - 2 sets - 10 reps - 3 hold - Supine Straight Leg Raises  - 1 x daily - 7 x weekly - 2 sets - 10 reps - 3 hold - Sit to Stand with Counter Support  - 1 x daily - 7 x weekly - 2 sets - 10 reps - 3 hold  ASSESSMENT:  CLINICAL IMPRESSION: Patient is a 57 y.o. male who was seen today for physical therapy evaluation and treatment for gait instability /balance issues High fall risk. Pt presents balance deficits with decreased LE strength, decrease coordination regarding pace of UE and LE movements, decreased single leg stance time, and hip and knee flexion tightness which affects pt's posture and gait quality..   OBJECTIVE IMPAIRMENTS decreased activity  tolerance, decreased balance, decreased coordination, decreased knowledge of condition, decreased mobility, difficulty walking, decreased ROM, decreased strength, decreased safety awareness, and postural dysfunction.   ACTIVITY LIMITATIONS carrying, lifting, bending, standing, squatting, stairs, transfers, bathing, toileting, dressing, self feeding, and locomotion level  PARTICIPATION LIMITATIONS: meal prep, cleaning, laundry, and community activity  PERSONAL FACTORS Behavior pattern, Fitness, Past/current experiences, Time since onset of injury/illness/exacerbation, and 3+ comorbidities:    Mental retardation, Glaucoma, obesity, Cardiomyopathy, CHF, Schizophrenia, Total self-care deficit are also affecting patient's functional outcome.   REHAB POTENTIAL: Fair due to chronicity and comorbidities  CLINICAL DECISION MAKING: Evolving/moderate complexity  EVALUATION COMPLEXITY: Moderate   GOALS:  SHORT TERM GOALS: Target date: 04/27/2022  Pt and CG will be Ind in an initial HEP Baseline:Started Goal status: INITIAL  LONG TERM GOALS: Target date: 05/26/22   Pt will be Ind in a final HEP to maintain achieved LOF  Baseline: Started Goal status: INITIAL  2.  Increase knee ext AROM to -5d and improved hip ext ROM for improved posture and gait qaulity Baseline: bilat knee ext -10d  Goal status: INITIAL  3.  Improve SLS to 4 sec or greater for improved balance and safety Baseline: 1 sec each Goal status: INITIAL  4.  Increase bilat knee and hip strength to 4+ and 4- respectively for improved balane and safety  Baseline: See flow sheets Goal status: INITIAL  5.  Improve 5xSTS by MCID of 5" as indication of improved functional mobility Baseline: 23.8 sec c armrest assist Goal status: INITIAL  6. Improve Berg by 7 points as indication of improved balance Baseline: TBA Goal status: INITIAL   PLAN: PT FREQUENCY: 2x/week  PT DURATION: 6 weeks  PLANNED INTERVENTIONS: Therapeutic  exercises, Therapeutic activity, Neuromuscular re-education, Balance training, Gait training, Patient/Family education, Self Care, Joint mobilization, Manual therapy, and Re-evaluation  PLAN FOR NEXT SESSION: Assess Berg, Assess response to HEP; progress therex as indicated; use of modalities and manual therapy as indicated.    Gar Ponto MS, PT  04/06/22 11:02 AM

## 2022-04-11 ENCOUNTER — Ambulatory Visit: Payer: Medicare Other | Admitting: Physical Therapy

## 2022-04-13 ENCOUNTER — Ambulatory Visit: Payer: Medicare Other

## 2022-04-13 DIAGNOSIS — M25662 Stiffness of left knee, not elsewhere classified: Secondary | ICD-10-CM

## 2022-04-13 DIAGNOSIS — R262 Difficulty in walking, not elsewhere classified: Secondary | ICD-10-CM

## 2022-04-13 DIAGNOSIS — M25661 Stiffness of right knee, not elsewhere classified: Secondary | ICD-10-CM

## 2022-04-13 DIAGNOSIS — M6281 Muscle weakness (generalized): Secondary | ICD-10-CM

## 2022-04-13 NOTE — Therapy (Signed)
OUTPATIENT PHYSICAL THERAPY TREATMENT NOTE   Patient Name: [name] Branch MRN: [id number] DOB:[birth year], 56 y.o., male Today's Date: 04/13/2022  PCP: [name], MD REFERRING PROVIDER: Michae Kava, MD  END OF SESSION:   PT End of Session - 04/13/22 1903     Visit Number 2    Number of Visits 13    Date for PT Re-Evaluation 05/26/22    Authorization Type UHC MEDICARE, MEDICAID OF [place]    Authorization Time Period FOTO v6, v10, kx mod v15    Progress Note Due on Visit 10    PT Start Time 1841   Pt arrived 10 minutes late to his appointment.   PT Stop Time 1910    PT Time Calculation (min) 29 min    Equipment Utilized During Treatment Gait belt    Activity Tolerance Patient tolerated treatment well    Behavior During Therapy WFL for tasks assessed/performed             Past Medical History:  Diagnosis Date   ABSCESS, ANAL/RECTAL REGIONS 08/18/2006   Annotation: Fournier gangrene Qualifier: History of  By: Garnette Scheuermann MD, Yogesh     Allergy    Asthma    Cancer [facility])    Cardiomyopathy    Cataract    CHF (congestive heart failure) ([place])    Colonic inertia    Constipation    Emphysema of lung ([facility])    GERD (gastroesophageal reflux disease)    Glaucoma    Hyperlipidemia    Hypovolemic shock ([facility])    Incontinent of feces    Mental retardation    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   Annotation: chronic with stercoral ulcer  Qualifier: Diagnosis of  By: Garnette Scheuermann MD, Yogesh     Obstruction of colon ([place])    recurrent   Sarcoidosis    Schizophrenia ([place])    Sleep apnea    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   Total self-care deficit    per caregiver he needs to have help bathing and cleaning self and needs help with all daily needs now    Past Surgical History:  Procedure Laterality Date   BALLOON DILATION N/A 07/26/2021   Procedure: BALLOON DILATION;  Surgeon: Mauri Pole, MD;  [facility];  Service: Endoscopy;  Laterality: N/A;   BIOPSY  07/26/2021    Procedure: BIOPSY;  Surgeon: Mauri Pole, MD;  [facility];  Service: Endoscopy;;   COLECTOMY N/A 09/25/2014   Procedure: OPEN ABDOMINAL COLECTOMY;  Surgeon: Michael Boston, MD;  [facility];  Service: General;  Laterality: N/A;   COLONOSCOPY  2012   FLEXIBLE SIGMOIDOSCOPY N/A 07/26/2021   Procedure: FLEXIBLE SIGMOIDOSCOPY;  Surgeon: Mauri Pole, MD;  [facility];  Service: Endoscopy;  Laterality: N/A;   groin surgery     boil drained   MOUTH SURGERY     PROCTOSCOPY N/A 09/25/2014   Procedure: RIGID PROCTOSCOPY;  Surgeon: Michael Boston, MD;  [facility];  Service: General;  Laterality: N/A;   Patient Active Problem List   Diagnosis Date Noted   Rectal stricture    Encounter for colorectal cancer screening 02/09/2021   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   Incontinent of feces    Total self-care deficit    S/P total colectomy 07/20/2017   Dizziness 10/12/2016   Anemia of chronic disease 10/05/2014   Colonic inertia s/p abdominal colectomy 09/25/2014 09/25/2014   Hyperlipidemia 09/17/2008   Glaucoma  10/10/2006   Sarcoidosis 06/11/2006   Schizophrenia, unspecified type ([place]) 06/11/2006   Intellectual disability 78/58/8502   Chronic systolic CHF (congestive heart failure) ([place]) 06/11/2006   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    REFERRING DIAG: gait instability /balance issues High fall risk  THERAPY DIAG:  Difficulty in walking, not elsewhere classified  Muscle weakness (generalized)  Stiffness of left knee, not elsewhere classified  Stiffness of right knee, not elsewhere classified  Rationale for Evaluation and Treatment Rehabilitation  PERTINENT HISTORY: Mental retardation, Glaucoma, obesity, Cardiomyopathy, CHF, Schizophrenia, Total self-care deficit   PRECAUTIONS: Fall  SUBJECTIVE: Pt is accompanied by his sister, [name], who helps with subjective information. She  reports the patient has not felt well the past week, although he is feeling better today.   PAIN:  Are you having pain? No   OBJECTIVE: (objective measures completed at initial evaluation unless otherwise dated)  DIAGNOSTIC FINDINGS: none relevant   PATIENT SURVEYS:  NA   COGNITION:           Overall cognitive status: Impaired                        SENSATION: WFL   EDEMA:  NT   MUSCLE LENGTH: Hamstrings: Right tight deg; Left tight deg Thomas test: Right tight deg; Left tight deg   POSTURE: increased thoracic kyphosis, anterior pelvic tilt, flexed trunk , and flexed kness   PALPATION: NT   LOWER EXTREMITY ROM:   Active ROM Right eval Left eval  Hip flexion      Hip extension      Hip abduction      Hip adduction      Hip internal rotation      Hip external rotation      Knee flexion      Knee extension -10 -10  Ankle dorsiflexion      Ankle plantarflexion      Ankle inversion      Ankle eversion       (Blank rows = not tested)   LOWER EXTREMITY MMT:   MMT Right eval Left eval  Hip flexion 3- 3-  Hip extension 3- 3-  Hip abduction 3- 3-  Hip adduction      Hip internal rotation      Hip external rotation 3 3  Knee flexion 4 4  Knee extension 4 4  Ankle dorsiflexion      Ankle plantarflexion      Ankle inversion      Ankle eversion       (Blank rows = not tested)   LOWER EXTREMITY SPECIAL TESTS:  NT   FUNCTIONAL TESTS:  5xSTS= 23.8 sec c use of hands on armrest   Quick Balance Tests: Standing 2 apart= 2 mins no unsteadiness            Standing feet together= 2 mins no unsteadiness            Standing eyes close, feet apart= no unsteadiness for 10 sec SLS = 1 sec bilat   Berg: TBA   Coordination: Decreased pace c finger to nose; Dysdiadochokinesis   GAIT: Distance walked: 246f Assistive device utilized: Walker - 4 wheeled Level of assistance: SBA Comments: flexed trunk, hips, knees and out toeing    TODAY'S TREATMENT:  [facility]  Adult PT Treatment:  DATE: 04/13/2022 Therapeutic Exercise: Dead lift with 15# kettlebell 3x8 Standing marching on Airex pad 2x30sec Heel raises on Airex pad 2x10 Standing hip extension with 7# cable to ankle attachment 2x10 BIL Manual Therapy: N/A Neuromuscular re-ed: N/A Therapeutic Activity: N/A Modalities: N/A Self Care: N/A   [facility] Adult PT Treatment:                                                DATE: 04/05/22 Therapeutic Exercise: - Supine Bridge  5 reps - 5 hold - Hooklying Clamshell with Resistance  GTB 10 reps - 3 hold - Supine Straight Leg Raises  5 reps - 3 hold - Sit to Stand with Counter Support  5 reps - 3 hold   PATIENT EDUCATION:  Education details: Eval findings, POC, HEP, self care Person educated: Patient and Caregiver sister Education method: Explanation, Demonstration, Tactile cues, Verbal cues, and Handouts Education comprehension: verbalized understanding, returned demonstration, verbal cues required, and tactile cues required     HOME EXERCISE PROGRAM: Access Code: [email or web address] URL: [email or web address] Date: 04/06/2022 Prepared by: Gar Ponto   Exercises - Supine Bridge  - 1 x daily - 7 x weekly - 2 sets - 10 reps - 5 hold - Hooklying Clamshell with Resistance  - 1 x daily - 7 x weekly - 2 sets - 10 reps - 3 hold - Supine Straight Leg Raises  - 1 x daily - 7 x weekly - 2 sets - 10 reps - 3 hold - Sit to Stand with Counter Support  - 1 x daily - 7 x weekly - 2 sets - 10 reps - 3 hold   ASSESSMENT:   CLINICAL IMPRESSION: Pt responded well to all interventions today, demonstrating good form with extensive verbal, visual, and tactile cues. He reports increased fatigue to close the session. He will continue to benefit from skilled PT to address his primary impairments and return to his prior level of function with less limitation.   OBJECTIVE IMPAIRMENTS decreased activity tolerance, decreased  balance, decreased coordination, decreased knowledge of condition, decreased mobility, difficulty walking, decreased ROM, decreased strength, decreased safety awareness, and postural dysfunction.    ACTIVITY LIMITATIONS carrying, lifting, bending, standing, squatting, stairs, transfers, bathing, toileting, dressing, self feeding, and locomotion level   PARTICIPATION LIMITATIONS: meal prep, cleaning, laundry, and community activity   PERSONAL FACTORS Behavior pattern, Fitness, Past/current experiences, Time since onset of injury/illness/exacerbation, and 3+ comorbidities:    Mental retardation, Glaucoma, obesity, Cardiomyopathy, CHF, Schizophrenia, Total self-care deficit are also affecting patient's functional outcome.        GOALS:   SHORT TERM GOALS: Target date: 04/27/2022  Pt and CG will be Ind in an initial HEP Baseline:Started Goal status: INITIAL   LONG TERM GOALS: Target date: 05/26/22    Pt will be Ind in a final HEP to maintain achieved LOF  Baseline: Started Goal status: INITIAL   2.  Increase knee ext AROM to -5d and improved hip ext ROM for improved posture and gait qaulity Baseline: bilat knee ext -10d  Goal status: INITIAL   3.  Improve SLS to 4 sec or greater for improved balance and safety Baseline: 1 sec each Goal status: INITIAL   4.  Increase bilat knee and hip strength to 4+ and 4- respectively for improved balane and safety  Baseline: See flow sheets Goal status: INITIAL  5.  Improve 5xSTS by MCID of 5" as indication of improved functional mobility Baseline: 23.8 sec c armrest assist Goal status: INITIAL   6. Improve Berg by 7 points as indication of improved balance Baseline: TBA Goal status: INITIAL     PLAN: PT FREQUENCY: 2x/week   PT DURATION: 6 weeks   PLANNED INTERVENTIONS: Therapeutic exercises, Therapeutic activity, Neuromuscular re-education, Balance training, Gait training, Patient/Family education, Self Care, Joint mobilization, Manual  therapy, and Re-evaluation   PLAN FOR NEXT SESSION: Assess Berg, Assess response to HEP; progress therex as indicated; use of modalities and manual therapy as indicated.    Vanessa Stonewall, PT, DPT 04/13/22 7:10 PM

## 2022-04-25 ENCOUNTER — Ambulatory Visit: Payer: Medicare Other

## 2022-04-25 DIAGNOSIS — R262 Difficulty in walking, not elsewhere classified: Secondary | ICD-10-CM

## 2022-04-25 DIAGNOSIS — M6281 Muscle weakness (generalized): Secondary | ICD-10-CM

## 2022-04-25 NOTE — Therapy (Signed)
OUTPATIENT PHYSICAL THERAPY TREATMENT NOTE   Patient Name: [name] Branch MRN: [id number] DOB:08-Nov-1965, 56 y.o., male Today's Date: 04/25/2022  PCP: [name], MD REFERRING PROVIDER: Michae Kava, MD  END OF SESSION:   PT End of Session - 04/25/22 1655     Visit Number 3    Number of Visits 13    Date for PT Re-Evaluation 05/26/22    Authorization Type UHC MEDICARE, MEDICAID OF [place]    Authorization Time Period FOTO v6, v10, kx mod v15    Progress Note Due on Visit 10    PT Start Time 1703    PT Stop Time 1741    PT Time Calculation (min) 38 min    Equipment Utilized During Treatment Gait belt    Activity Tolerance Patient tolerated treatment well    Behavior During Therapy WFL for tasks assessed/performed              Past Medical History:  Diagnosis Date   ABSCESS, ANAL/RECTAL REGIONS 08/18/2006   Annotation: Fournier gangrene Qualifier: History of  By: Garnette Scheuermann MD, Yogesh     Allergy    Asthma    Cancer [facility])    Cardiomyopathy    Cataract    CHF (congestive heart failure) ([employer])    Colonic inertia    Constipation    Emphysema of lung ([place])    GERD (gastroesophageal reflux disease)    Glaucoma    Hyperlipidemia    Hypovolemic shock ([place])    Incontinent of feces    Mental retardation    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   Annotation: chronic with stercoral ulcer  Qualifier: Diagnosis of  By: Garnette Scheuermann MD, Yogesh     Obstruction of colon ([place])    recurrent   Sarcoidosis    Schizophrenia ([place])    Sleep apnea    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   Total self-care deficit    per caregiver he needs to have help bathing and cleaning self and needs help with all daily needs now    Past Surgical History:  Procedure Laterality Date   BALLOON DILATION N/A 07/26/2021   Procedure: BALLOON DILATION;  Surgeon: Mauri Pole, MD;  [facility];  Service: Endoscopy;  Laterality: N/A;   BIOPSY  07/26/2021   Procedure: BIOPSY;  Surgeon: Mauri Pole, MD;  [facility];  Service: Endoscopy;;   COLECTOMY N/A 09/25/2014   Procedure: OPEN ABDOMINAL COLECTOMY;  Surgeon: Michael Boston, MD;  Location: [facility];  Service: General;  Laterality: N/A;   COLONOSCOPY  2012   FLEXIBLE SIGMOIDOSCOPY N/A 07/26/2021   Procedure: FLEXIBLE SIGMOIDOSCOPY;  Surgeon: Mauri Pole, MD;  [facility];  Service: Endoscopy;  Laterality: N/A;   groin surgery     boil drained   MOUTH SURGERY     PROCTOSCOPY N/A 09/25/2014   Procedure: RIGID PROCTOSCOPY;  Surgeon: Michael Boston, MD;  [facility]: [facility];  Service: General;  Laterality: N/A;   Patient Active Problem List   Diagnosis Date Noted   Rectal stricture    Encounter for colorectal cancer screening 02/09/2021   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   Incontinent of feces    Total self-care deficit    S/P total colectomy 07/20/2017   Dizziness 10/12/2016   Anemia of chronic disease 10/05/2014   Colonic inertia s/p abdominal colectomy 09/25/2014 09/25/2014   Hyperlipidemia 09/17/2008   Glaucoma 10/10/2006   Sarcoidosis 06/11/2006   Schizophrenia,  unspecified type ([place]) 06/11/2006   Intellectual disability 78/93/8101   Chronic systolic CHF (congestive heart failure) ([employer]) 06/11/2006   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    REFERRING DIAG: gait instability /balance issues High fall risk  THERAPY DIAG:  Difficulty in walking, not elsewhere classified  Muscle weakness (generalized)  Rationale for Evaluation and Treatment Rehabilitation  PERTINENT HISTORY: Mental retardation, Glaucoma, obesity, Cardiomyopathy, CHF, Schizophrenia, Total self-care deficit   PRECAUTIONS: Fall  SUBJECTIVE: Pt presents to PT with sister. Reports no pain or new symptoms since last session. Is ready to begin PT at this time.   PAIN:  Are you having pain? No   OBJECTIVE: (objective measures completed at initial evaluation unless  otherwise dated)  DIAGNOSTIC FINDINGS: none relevant   PATIENT SURVEYS:  NA   COGNITION:           Overall cognitive status: Impaired                        SENSATION: WFL   EDEMA:  NT   MUSCLE LENGTH: Hamstrings: Right tight deg; Left tight deg Thomas test: Right tight deg; Left tight deg   POSTURE: increased thoracic kyphosis, anterior pelvic tilt, flexed trunk , and flexed kness   PALPATION: NT   LOWER EXTREMITY ROM:   Active ROM Right eval Left eval  Hip flexion      Hip extension      Hip abduction      Hip adduction      Hip internal rotation      Hip external rotation      Knee flexion      Knee extension -10 -10  Ankle dorsiflexion      Ankle plantarflexion      Ankle inversion      Ankle eversion       (Blank rows = not tested)   LOWER EXTREMITY MMT:   MMT Right eval Left eval  Hip flexion 3- 3-  Hip extension 3- 3-  Hip abduction 3- 3-  Hip adduction      Hip internal rotation      Hip external rotation 3 3  Knee flexion 4 4  Knee extension 4 4  Ankle dorsiflexion      Ankle plantarflexion      Ankle inversion      Ankle eversion       (Blank rows = not tested)   LOWER EXTREMITY SPECIAL TESTS:  NT   FUNCTIONAL TESTS:  5xSTS= 23.8 sec c use of hands on armrest   Quick Balance Tests: Standing 2 apart= 2 mins no unsteadiness            Standing feet together= 2 mins no unsteadiness            Standing eyes close, feet apart= no unsteadiness for 10 sec SLS = 1 sec bilat   Berg: TBA   Coordination: Decreased pace c finger to nose; Dysdiadochokinesis   GAIT: Distance walked: 261f Assistive device utilized: WEnvironmental consultant- 4 wheeled Level of assistance: SBA Comments: flexed trunk, hips, knees and out toeing    [facility]                                                [facility]: 04[facility]25/2022 Therapeutic Exercise: Bridge 2x10  Supine ball squeeze 2x10 - 3"  hold  Hooklying Clamshell with Resistance  GTB 10  reps - 3 hold Supine SLR 2x10 each Standing marches in // x 20 Lateral walk in // 1 UE support Standing hip abd/ext x 10 each FT on foam 2x30" Step ups fwd x 10 8in  [facility] Adult PT Treatment:                                                DATE: 04-28-22 Therapeutic Exercise: Dead lift with 15# kettlebell 3x8 Standing marching on Airex pad 2x30sec Heel raises on Airex pad 2x10 Standing hip extension with 7# cable to ankle attachment 2x10 BIL  [facility] Adult PT Treatment:                                                DATE: 04/05/22 Therapeutic Exercise: Supine Bridge  5 reps - 5 hold Hooklying Clamshell with Resistance  GTB 10 reps - 3 hold Supine Straight Leg Raises  5 reps - 3 hold Sit to Stand with Counter Support  5 reps - 3 hold   PATIENT EDUCATION:  Education details: Eval findings, POC, HEP, self care Person educated: Patient and Caregiver sister Education method: Explanation, Demonstration, Tactile cues, Verbal cues, and Handouts Education comprehension: verbalized understanding, returned demonstration, verbal cues required, and tactile cues required     HOME EXERCISE PROGRAM: Access Code: [email or web address] URL: [email or web address] Date: 04/06/2022 Prepared by: Gar Ponto   Exercises - Supine Bridge  - 1 x daily - 7 x weekly - 2 sets - 10 reps - 5 hold - Hooklying Clamshell with Resistance  - 1 x daily - 7 x weekly - 2 sets - 10 reps - 3 hold - Supine Straight Leg Raises  - 1 x daily - 7 x weekly - 2 sets - 10 reps - 3 hold - Sit to Stand with Counter Support  - 1 x daily - 7 x weekly - 2 sets - 10 reps - 3 hold   ASSESSMENT:   CLINICAL IMPRESSION: Pt able to complete all prescribed exercises today with no increase in pain. Required extensive verbal and tactile cues for proper performance and sequencing of exercise. Pt hesitant to try SLS without UE support. Continues to benefit from skilled PT working on strength and balance.     OBJECTIVE IMPAIRMENTS decreased  activity tolerance, decreased balance, decreased coordination, decreased knowledge of condition, decreased mobility, difficulty walking, decreased ROM, decreased strength, decreased safety awareness, and postural dysfunction.    ACTIVITY LIMITATIONS carrying, lifting, bending, standing, squatting, stairs, transfers, bathing, toileting, dressing, self feeding, and locomotion level   PARTICIPATION LIMITATIONS: meal prep, cleaning, laundry, and community activity   PERSONAL FACTORS Behavior pattern, Fitness, Past/current experiences, Time since onset of injury/illness/exacerbation, and 3+ comorbidities:    Mental retardation, Glaucoma, obesity, Cardiomyopathy, CHF, Schizophrenia, Total self-care deficit are also affecting patient's functional outcome.        GOALS:   SHORT TERM GOALS: Target date: 04/27/2022  Pt and CG will be Ind in an initial HEP Baseline:Started Goal status: INITIAL   LONG TERM GOALS: Target date: 05/26/22    Pt will be Ind in a final HEP to maintain achieved LOF  Baseline: Started Goal status: INITIAL   2.  Increase  knee ext AROM to -5d and improved hip ext ROM for improved posture and gait qaulity Baseline: bilat knee ext -10d  Goal status: INITIAL   3.  Improve SLS to 4 sec or greater for improved balance and safety Baseline: 1 sec each Goal status: INITIAL   4.  Increase bilat knee and hip strength to 4+ and 4- respectively for improved balane and safety  Baseline: See flow sheets Goal status: INITIAL   5.  Improve 5xSTS by MCID of 5" as indication of improved functional mobility Baseline: 23.8 sec c armrest assist Goal status: INITIAL   6. Improve Berg by 7 points as indication of improved balance Baseline: TBA Goal status: INITIAL     PLAN: PT FREQUENCY: 2x/week   PT DURATION: 6 weeks   PLANNED INTERVENTIONS: Therapeutic exercises, Therapeutic activity, Neuromuscular re-education, Balance training, Gait training, Patient/Family education, Self  Care, Joint mobilization, Manual therapy, and Re-evaluation   PLAN FOR NEXT SESSION: Assess Berg, Assess response to HEP; progress therex as indicated; use of modalities and manual therapy as indicated.    Ward Chatters PT 04/25/22 5:49 PM

## 2022-05-04 ENCOUNTER — Ambulatory Visit: Payer: Medicare Other

## 2022-05-11 ENCOUNTER — Ambulatory Visit: Payer: Medicare Other | Attending: Family Medicine

## 2022-05-11 DIAGNOSIS — M25661 Stiffness of right knee, not elsewhere classified: Secondary | ICD-10-CM | POA: Insufficient documentation

## 2022-05-11 DIAGNOSIS — E119 Type 2 diabetes mellitus without complications: Secondary | ICD-10-CM | POA: Diagnosis not present

## 2022-05-11 DIAGNOSIS — R262 Difficulty in walking, not elsewhere classified: Secondary | ICD-10-CM | POA: Diagnosis present

## 2022-05-11 DIAGNOSIS — Z79899 Other long term (current) drug therapy: Secondary | ICD-10-CM | POA: Insufficient documentation

## 2022-05-11 DIAGNOSIS — Z7984 Long term (current) use of oral hypoglycemic drugs: Secondary | ICD-10-CM | POA: Insufficient documentation

## 2022-05-11 DIAGNOSIS — Z9181 History of falling: Secondary | ICD-10-CM | POA: Insufficient documentation

## 2022-05-11 DIAGNOSIS — M25662 Stiffness of left knee, not elsewhere classified: Secondary | ICD-10-CM | POA: Diagnosis not present

## 2022-05-11 DIAGNOSIS — M6281 Muscle weakness (generalized): Secondary | ICD-10-CM | POA: Diagnosis present

## 2022-05-11 NOTE — Therapy (Signed)
OUTPATIENT PHYSICAL THERAPY TREATMENT NOTE   Patient Name: [name] Branch MRN: [id number] DOB:1965-09-15, 56 y.o., male Today's Date: 05/11/2022  PCP: [name], MD REFERRING PROVIDER: Michae Kava, MD  END OF SESSION:   PT End of Session - 05/11/22 1650     Visit Number 4    Number of Visits 13    Date for PT Re-Evaluation 05/26/22    Authorization Type UHC MEDICARE, MEDICAID OF [place]    Authorization Time Period FOTO v6, v10, kx mod v15    Progress Note Due on Visit 10    PT Start Time 1655    PT Stop Time 1735    PT Time Calculation (min) 40 min    Equipment Utilized During Treatment Gait belt    Activity Tolerance Patient tolerated treatment well    Behavior During Therapy WFL for tasks assessed/performed               Past Medical History:  Diagnosis Date   ABSCESS, ANAL/RECTAL REGIONS 08/18/2006   Annotation: Fournier gangrene Qualifier: History of  By: Garnette Scheuermann MD, Yogesh     Allergy    Asthma    Cancer [facility])    Cardiomyopathy    Cataract    CHF (congestive heart failure) ([place])    Colonic inertia    Constipation    Emphysema of lung ([place])    GERD (gastroesophageal reflux disease)    Glaucoma    Hyperlipidemia    Hypovolemic shock ([place])    Incontinent of feces    Mental retardation    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   Annotation: chronic with stercoral ulcer  Qualifier: Diagnosis of  By: Garnette Scheuermann MD, Yogesh     Obstruction of colon ([place])    recurrent   Sarcoidosis    Schizophrenia ([place])    Sleep apnea    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   Total self-care deficit    per caregiver he needs to have help bathing and cleaning self and needs help with all daily needs now    Past Surgical History:  Procedure Laterality Date   BALLOON DILATION N/A 07/26/2021   Procedure: BALLOON DILATION;  Surgeon: Mauri Pole, MD;  [facility];  Service: Endoscopy;  Laterality: N/A;   BIOPSY  07/26/2021   Procedure: BIOPSY;  Surgeon: Mauri Pole, MD;  [facility];  Service: Endoscopy;;   COLECTOMY N/A 09/25/2014   Procedure: OPEN ABDOMINAL COLECTOMY;  Surgeon: Michael Boston, MD;  [facility];  Service: General;  Laterality: N/A;   COLONOSCOPY  2012   FLEXIBLE SIGMOIDOSCOPY N/A 07/26/2021   Procedure: FLEXIBLE SIGMOIDOSCOPY;  Surgeon: Mauri Pole, MD;  [facility];  Service: Endoscopy;  Laterality: N/A;   groin surgery     boil drained   MOUTH SURGERY     PROCTOSCOPY N/A 09/25/2014   Procedure: RIGID PROCTOSCOPY;  Surgeon: Michael Boston, MD;  [facility];  Service: General;  Laterality: N/A;   Patient Active Problem List   Diagnosis Date Noted   Rectal stricture    Encounter for colorectal cancer screening 02/09/2021   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   Incontinent of feces    Total self-care deficit    S/P total colectomy 07/20/2017   Dizziness 10/12/2016   Anemia of chronic disease 10/05/2014   Colonic inertia s/p abdominal colectomy 09/25/2014 09/25/2014   Hyperlipidemia 09/17/2008   Glaucoma 10/10/2006   Sarcoidosis 06/11/2006  Schizophrenia, unspecified type ([place]) 06/11/2006   Intellectual disability 52/84/1324   Chronic systolic CHF (congestive heart failure) ([place]) 06/11/2006   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    REFERRING DIAG: gait instability /balance issues High fall risk  THERAPY DIAG:  Difficulty in walking, not elsewhere classified  Muscle weakness (generalized)  Stiffness of left knee, not elsewhere classified  Stiffness of right knee, not elsewhere classified  Rationale for Evaluation and Treatment Rehabilitation  PERTINENT HISTORY: Mental retardation, Glaucoma, obesity, Cardiomyopathy, CHF, Schizophrenia, Total self-care deficit   PRECAUTIONS: Fall  SUBJECTIVE: Pt denies any pain or falls since his last session. He reports adherence to his HEP. He is accompanied by his sister, [name] Branch, who  helps with subjective gathering.   PAIN:  Are you having pain? No   OBJECTIVE: (objective measures completed at initial evaluation unless otherwise dated)  DIAGNOSTIC FINDINGS: none relevant   PATIENT SURVEYS:  NA   COGNITION:           Overall cognitive status: Impaired                        SENSATION: WFL   EDEMA:  NT   MUSCLE LENGTH: Hamstrings: Right tight deg; Left tight deg Thomas test: Right tight deg; Left tight deg   POSTURE: increased thoracic kyphosis, anterior pelvic tilt, flexed trunk , and flexed kness   PALPATION: NT   LOWER EXTREMITY ROM:   Active ROM Right eval Left eval  Hip flexion      Hip extension      Hip abduction      Hip adduction      Hip internal rotation      Hip external rotation      Knee flexion      Knee extension -10 -10  Ankle dorsiflexion      Ankle plantarflexion      Ankle inversion      Ankle eversion       (Blank rows = not tested)   LOWER EXTREMITY MMT:   MMT Right eval Left eval  Hip flexion 3- 3-  Hip extension 3- 3-  Hip abduction 3- 3-  Hip adduction      Hip internal rotation      Hip external rotation 3 3  Knee flexion 4 4  Knee extension 4 4  Ankle dorsiflexion      Ankle plantarflexion      Ankle inversion      Ankle eversion       (Blank rows = not tested)   LOWER EXTREMITY SPECIAL TESTS:  NT   FUNCTIONAL TESTS:  5xSTS= 23.8 sec c use of hands on armrest   Quick Balance Tests: Standing 2 apart= 2 mins no unsteadiness            Standing feet together= 2 mins no unsteadiness            Standing eyes close, feet apart= no unsteadiness for 10 sec SLS = 1 sec bilat   Berg: TBA   Coordination: Decreased pace c finger to nose; Dysdiadochokinesis   GAIT: Distance walked: 235f Assistive device utilized: Walker - 4 wheeled Level of assistance: SBA Comments: flexed trunk, hips, knees and out toeing    TODAY'S TREATMENT:  [facility] Adult PT Treatment:  DATE: 05/11/2022 Therapeutic Exercise: Mini-squat side steps 3x5 length of mat table Standing Cybex hip abduction with 25# 2x10 BIL Standing Cybex hip extension with 25# 2x10 BIL Manual Therapy: N/A Neuromuscular re-ed: Romberg stance with alternating random perturbations to torso in different directions with cues to correct using hips 3x30 Standing marching with slow leg lowering with tactile cues to touch knee to PT's hand 2x20 Step up, backward step down on Airex pad 2x10 BIL Lateral step up, step down on Airex pad x10 BIL Therapeutic Activity: N/A Modalities: N/A Self Care: N/A   [facility] Adult PT Treatment:                                                DATE: 04/25/2022 Therapeutic Exercise: Bridge 2x10  Supine ball squeeze 2x10 - 3" hold  Hooklying Clamshell with Resistance  GTB 10 reps - 3 hold Supine SLR 2x10 each Standing marches in // x 20 Lateral walk in // 1 UE support Standing hip abd/ext x 10 each FT on foam 2x30" Step ups fwd x 10 8in  [facility] Adult PT Treatment:                                                DATE: 04/26/2022 Therapeutic Exercise: Dead lift with 15# kettlebell 3x8 Standing marching on Airex pad 2x30sec Heel raises on Airex pad 2x10 Standing hip extension with 7# cable to ankle attachment 2x10 BIL    PATIENT EDUCATION:  Education details: Eval findings, POC, HEP, self care Person educated: Patient and Caregiver sister Education method: Explanation, Demonstration, Tactile cues, Verbal cues, and Handouts Education comprehension: verbalized understanding, returned demonstration, verbal cues required, and tactile cues required     HOME EXERCISE PROGRAM: Access Code: [email or web address] URL: [email or web address] Date: 04/06/2022 Prepared by: Gar Ponto   Exercises - Supine Bridge  - 1 x daily - 7 x weekly - 2 sets - 10 reps - 5 hold - Hooklying Clamshell with Resistance  - 1 x daily - 7 x weekly - 2 sets - 10 reps - 3 hold -  Supine Straight Leg Raises  - 1 x daily - 7 x weekly - 2 sets - 10 reps - 3 hold - Sit to Stand with Counter Support  - 1 x daily - 7 x weekly - 2 sets - 10 reps - 3 hold   ASSESSMENT:   CLINICAL IMPRESSION: Pt responded well to all exercises today, demonstrating good form with moderate fatigue throughout the session. He continues to have fear with balance exercises and requires regular verbal and tactile cuing for form. The pt will benefit from continued skilled PT to address his primary impairments and return to his prior level of function with less limitaiton.    OBJECTIVE IMPAIRMENTS decreased activity tolerance, decreased balance, decreased coordination, decreased knowledge of condition, decreased mobility, difficulty walking, decreased ROM, decreased strength, decreased safety awareness, and postural dysfunction.    ACTIVITY LIMITATIONS carrying, lifting, bending, standing, squatting, stairs, transfers, bathing, toileting, dressing, self feeding, and locomotion level   PARTICIPATION LIMITATIONS: meal prep, cleaning, laundry, and community activity   PERSONAL FACTORS Behavior pattern, Fitness, Past/current experiences, Time since onset of injury/illness/exacerbation, and 3+ comorbidities:    Mental retardation, Glaucoma, obesity, Cardiomyopathy, CHF,  Schizophrenia, Total self-care deficit are also affecting patient's functional outcome.        GOALS:   SHORT TERM GOALS: Target date: 04/27/2022  Pt and CG will be Ind in an initial HEP Baseline:Started Goal status: INITIAL   LONG TERM GOALS: Target date: 05/26/22    Pt will be Ind in a final HEP to maintain achieved LOF  Baseline: Started Goal status: INITIAL   2.  Increase knee ext AROM to -5d and improved hip ext ROM for improved posture and gait qaulity Baseline: bilat knee ext -10d  Goal status: INITIAL   3.  Improve SLS to 4 sec or greater for improved balance and safety Baseline: 1 sec each Goal status: INITIAL   4.   Increase bilat knee and hip strength to 4+ and 4- respectively for improved balane and safety  Baseline: See flow sheets Goal status: INITIAL   5.  Improve 5xSTS by MCID of 5" as indication of improved functional mobility Baseline: 23.8 sec c armrest assist Goal status: INITIAL   6. Improve Berg by 7 points as indication of improved balance Baseline: TBA Goal status: INITIAL     PLAN: PT FREQUENCY: 2x/week   PT DURATION: 6 weeks   PLANNED INTERVENTIONS: Therapeutic exercises, Therapeutic activity, Neuromuscular re-education, Balance training, Gait training, Patient/Family education, Self Care, Joint mobilization, Manual therapy, and Re-evaluation   PLAN FOR NEXT SESSION: Assess Berg, Assess response to HEP; progress therex as indicated; use of modalities and manual therapy as indicated.    Vanessa [place], PT, DPT 05/11/22 5:38 PM

## 2022-05-14 NOTE — Progress Notes (Signed)
CLINIC:  Survivorship  REASON FOR VISIT:  Routine follow-up for history of head & neck cancer.  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  -Last TSH:  -Weight: (LOSS/GAIN) since (last visit)   -Last ENT visit:   -Last Rad Onc visit:  -Last Dentist visit:     ADDITIONAL REVIEW OF SYSTEMS:  ROS    CURRENT MEDICATIONS:  Current Outpatient Medications on File Prior to Visit  Medication Sig Dispense Refill  . albuterol (PROVENTIL) (2.5 MG/3ML) 0.083% nebulizer solution Take 2.5 mg by nebulization every 6 (six) hours as needed for wheezing or shortness of breath.    Marland Kitchen albuterol (VENTOLIN HFA) 108 (90 Base) MCG/ACT inhaler Inhale 2 puffs into the lungs every 6 (six) hours as needed for wheezing or shortness of breath.    . carbamide peroxide (DEBROX) 6.5 % OTIC solution 5 drops daily as needed (ear wax removal).    . carboxymethylcellulose (REFRESH PLUS) 0.5 % SOLN 1 drop at bedtime. Gel    . cetirizine (ZYRTEC) 10 MG tablet Take 10 mg by mouth daily.    . cloZAPine (CLOZARIL) 100 MG tablet Take 200 mg by mouth See admin instructions. Take with 50 mg for a total of 250 mg at bedtime    . clozapine (CLOZARIL) 50 MG tablet Take 50 mg by mouth See admin instructions. Take with (2) 100 mg for a total of 250 mg at bedtime    . FARXIGA 10 MG TABS tablet Take 10 mg by mouth daily.    . fluticasone (FLONASE) 50 MCG/ACT nasal spray Place into both nostrils.    . fluvoxaMINE (LUVOX) 100 MG tablet Take 300 mg by mouth at bedtime.    Marland Kitchen ketoconazole (NIZORAL) 2 % cream Apply 1 application topically daily.     Marland Kitchen latanoprost (XALATAN) 0.005 % ophthalmic solution Place 1 drop into both eyes nightly.    . metoprolol succinate (TOPROL-XL) 50 MG 24 hr tablet Take 1 tablet (50 mg total) by mouth in the morning. Hold if top blood pressure number less than 100 mmHg or heart rate less than 60 bpm (pulse). 90 tablet 1  . Multiple Vitamin (MULTIVITAMIN) capsule Take 1 capsule by mouth daily. 90 capsule 3  . pravastatin (PRAVACHOL) 40 MG tablet TAKE ONE TABLET EACH DAY 90 tablet 3  . REFRESH OPTIVE 1-0.9 % GEL Apply 1 drop to eye as directed.    . sacubitril-valsartan (ENTRESTO) 24-26 MG Take 1 tablet by mouth 2 (two) times daily. 180 tablet 0  . sodium chloride (OCEAN) 0.65 % nasal spray Place 1 spray into the nose as needed for congestion.    . SODIUM FLUORIDE 5000 PLUS 1.1 % CREA dental cream SMARTSIG:Topical    . tiotropium (SPIRIVA) 18 MCG inhalation capsule Place 18 mcg into inhaler and inhale daily.    . Wheat Dextrin (BENEFIBER ON THE GO) PACK Take 1 packet by mouth 3 (three) times daily as needed. 320 each 11  . [DISCONTINUED] spironolactone (ALDACTONE) 25 MG tablet Take 0.5 tablets (12.5 mg total) by mouth in the morning. 45 tablet 0   No current facility-administered  medications on file prior to visit.    ALLERGIES:  Allergies  Allergen Reactions  . Hydrocodone Other (See Comments)    Exacerbation of urinary and fecal incontinence     PHYSICAL EXAM:  There were no vitals filed for this visit. There were no vitals filed for this visit.  Weight Date                     Pre-treatment (RT consult date):    General: Well-nourished, well-appearing male/male*** in no acute distress.  Accompanied/Unaccompanied today.  HEENT: Head is atraumatic and normocephalic.  Pupils equal and reactive to light. Conjunctivae clear without exudate.  Sclerae anicteric. Oral mucosa is pink and moist without lesions.  Tongue pink, moist, and midline. Oropharynx is pink and moist, without lesions. Lymph: No  preauricular, postauricular, cervical, supraclavicular, or infraclavicular lymphadenopathy noted on palpation.   Neck: No palpable masses. Skin on neck is ***.  Cardiovascular: Normal rate and rhythm. Respiratory: Clear to auscultation bilaterally. Chest expansion symmetric without accessory muscle use; breathing non-labored.  GI: Abdomen soft and round. Non-tender, non-distended. Bowel sounds normoactive.  GU: Deferred.   Neuro: No focal deficits. Steady gait.   Psych: Normal mood and affect for situation. Extremities: No edema.  Skin: Warm and dry.    LABORATORY DATA:  None at this visit.***  DIAGNOSTIC IMAGING:  None at this visit.    ASSESSMENT & PLAN:  Mr. Say is a pleasant 56 y.o. male/male*** with history of ***, diagnosed in ***(date);  treated with ***; completed treatment on (date).  Patient presents to survivorship clinic today for routine follow-up after finishing treatment.   1. Cancer***:  Mr. Omalley is clinically without evidence of disease or recurrence on physical exam today.     #. Nutritional status: Mr. Borchers reports that he is currently able to consume adequate nutrition by mouth/via tube***.  Weight is stable at *** lbs today.  Encouraged to continue to consume adequate hydration and nutrition, as tolerated.    #. At risk for dysphagia: Given Mr. Alligood treatment for *** cancer, which included surgery and radiation therapy***, he is at risk for chronic dysphagia.  he reports having difficulty with breads and meats, but is able to consume soft foods and liquids without difficulty.  I encouraged him to continue to perform the swallowing exercises, as directed by [name], SLP.  If Mr. Repsher requires further swallowing or speech therapy evaluation, I will be happy to place that referral, if needed.  Currently, the patient's reported swallowing concerns are to be expected and stable.    #.  At risk for neck lymphedema:  When patients with head  & neck cancers are treated with surgery and/or radiation therapy, there is an associated increased risk of neck lymphedema.  Mr. Cain reports that currently he is experiencing what would be considered mild symptoms. he does/does not*** have a neck compression garment and reports wearing/not wearing*** it, as directed.  I encouraged Mr. Hulgan to continue to wear the compression garment and practice the massage techniques to reduce the presence of lymphedema.  If his symptoms worsen, I would happy to place a formal referral to physical therapy for further evaluation and treatment.     #.  At risk for hypothyroidism: The thyroid gland is often affected after treatment for head & neck cancer.  Mr. Heacock most recent TSH was normal at *** on (date).  If appropriate, he will be prescribed thyroid supplement with Levothyroxine. We discussed that he will  continue to have serial TSH monitoring for at least the next 5 years as part of his routine follow-up and post-cancer treatment care.   #. At risk for tooth decay/dental concerns: After treatment with radiation for head & neck cancers, patients often experience xerostomia which increases their risk of dental caries. Mr. Maher was encouraged to see his dentist 3-4 times per year. The patient should also continue to use his fluoride trays daily, as directed by dentistry.  I encouraged him to reach out to either Dr. Enrique Sack (oncology dentist) or his primary care dentist with additional questions or concerns.   #.  Lung cancer screening:  [place] now offers eligible patients lung cancer screening with a low-dose chest CT to aid in early detection, provide more effective treatment options, and ultimately improve survival benefits for patients diagnosed early.  Below is the selection criteria for screening:  Medicare patients: 55-77 years; privately insured patients 55-80 years. Active or former smokers who have quit within the last 15 years. 30+  pack-year history of smoking  Exclusion criteria - No signs/symptoms of lung cancer (i.e., no recent history of hemoptysis and no unexplained weight loss >15 pounds in the last 6 months). Willing and healthy enough to undergo biopsies/surgery if needed.  Mr. Cazier currently does/does not*** meet criteria for lung cancer screening.  Therefore, I have/have not*** placed a referral for screening today.    #. Tobacco & alcohol use: Mr. Byers reports that he quit smoking in *** and continues to abstain from all tobacco products.  I congratulated his continued efforts to remain tobacco free.  I also reinforced the importance of avoiding alcohol consumption as well.  Both tobacco and alcohol use in patients with head & neck cancer increases the risk of recurrence.  They also increase the risk of other cancers, as well.  Mr. Wager states that he voiced understanding of the importance of continuing to remain both tobacco and alcohol-free.    #. Health maintenance and wellness promotion: Cancer patients who consume a diet rich in fruits and vegetables have better overall health and decreased risk of cancer recurrence. Mr. Hawkes was encouraged to consume 5-7 servings of fruits and vegetables per day, as tolerated. Mr. Russom was also encouraged to engage in moderate to vigorous exercise for 30 minutes per day most days of the week.   #. Support services/counseling: It is not uncommon for this period of the patient's cancer care trajectory to be one of many emotions and stressors.  We discussed an opportunity for him to participate in the next session of Head & Neck FYNN ("Finding Your New Normal") support group series, designed for patients after they have completed treatment.   Mr. Teal was encouraged to take advantage of our many other support services programs, support groups, and/or counseling in coping with his new life as a cancer survivor after completing anti-cancer treatment. The  patient was offered support today through active listening and expressive supportive counseling.     Dispo:  -See Dr. Marland Kitchen (ENT) in ***/2017 -Return to cancer center to see [name] in ***/2017 -Return to cancer center to see Survivorship NP in ***.      A total of *** minutes was spent in the face-to-face care of this patient, with greater than 50% of that time spent in counseling and care-coordination.    Cira Rue, NP [place] [phone]

## 2022-05-15 ENCOUNTER — Encounter: Payer: Self-pay | Admitting: Nurse Practitioner

## 2022-05-15 ENCOUNTER — Inpatient Hospital Stay: Payer: Medicare Other | Attending: Nurse Practitioner | Admitting: Nurse Practitioner

## 2022-05-15 ENCOUNTER — Other Ambulatory Visit: Payer: Self-pay

## 2022-05-15 VITALS — BP 133/81 | HR 98 | Temp 98.4°F | Resp 17 | Ht 74.0 in | Wt 267.9 lb

## 2022-05-15 DIAGNOSIS — C050 Malignant neoplasm of hard palate: Secondary | ICD-10-CM

## 2022-05-15 DIAGNOSIS — Z923 Personal history of irradiation: Secondary | ICD-10-CM | POA: Diagnosis not present

## 2022-05-15 DIAGNOSIS — Z8589 Personal history of malignant neoplasm of other organs and systems: Secondary | ICD-10-CM | POA: Insufficient documentation

## 2022-05-15 DIAGNOSIS — Z79899 Other long term (current) drug therapy: Secondary | ICD-10-CM | POA: Insufficient documentation

## 2022-05-15 DIAGNOSIS — Z7984 Long term (current) use of oral hypoglycemic drugs: Secondary | ICD-10-CM | POA: Diagnosis not present

## 2022-05-15 NOTE — Progress Notes (Signed)
Faxed referral order, pt demographic, [name]'s last office note, and the office note of [name] to Dr. [name] per Natasha Bence, NP request.  Fax confirmation received.

## 2022-05-18 ENCOUNTER — Ambulatory Visit: Payer: Medicare Other

## 2022-05-18 DIAGNOSIS — M6281 Muscle weakness (generalized): Secondary | ICD-10-CM

## 2022-05-18 DIAGNOSIS — M25661 Stiffness of right knee, not elsewhere classified: Secondary | ICD-10-CM

## 2022-05-18 DIAGNOSIS — M25662 Stiffness of left knee, not elsewhere classified: Secondary | ICD-10-CM

## 2022-05-18 DIAGNOSIS — R262 Difficulty in walking, not elsewhere classified: Secondary | ICD-10-CM | POA: Diagnosis not present

## 2022-05-18 NOTE — Therapy (Signed)
OUTPATIENT PHYSICAL THERAPY TREATMENT NOTE/PROGRESS NOTE  Progress Note Reporting Period 04/05/2022 to 05/18/2022  See note below for Objective Data and Assessment of Progress/Goals.     Patient Name: [name] Branch MRN: [id number] DOB:July 25, 1966, 56 y.o., male Today's Date: 05/18/2022  PCP: [name], MD REFERRING PROVIDER: Michae Kava, MD  END OF SESSION:   PT End of Session - 05/18/22 1531     Visit Number 5    Number of Visits 13    Date for PT Re-Evaluation 06/16/22    Authorization Type [employer]HC MEDICARE, MEDICAID OF [place]    Authorization Time Period FOTO v6, v10, kx mod v15    Progress Note Due on Visit 15    PT Start Time 1531    PT Stop Time 1610    PT Time Calculation (min) 39 min    Equipment Utilized During Treatment Gait belt    Activity Tolerance Patient tolerated treatment well    Behavior During Therapy WFL for tasks assessed/performed                Past Medical History:  Diagnosis Date   ABSCESS, ANAL/RECTAL REGIONS 08/18/2006   Annotation: Fournier gangrene Qualifier: History of  By: Garnette Scheuermann MD, Yogesh     Allergy    Asthma    Cancer [facility])    Cardiomyopathy    Cataract    CHF (congestive heart failure) ([place])    Colonic inertia    Constipation    Emphysema of lung ([place])    GERD (gastroesophageal reflux disease)    Glaucoma    Hyperlipidemia    Hypovolemic shock ([place])    Incontinent of feces    Mental retardation    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   Annotation: chronic with stercoral ulcer  Qualifier: Diagnosis of  By: Garnette Scheuermann MD, Yogesh     Obstruction of colon ([place])    recurrent   Sarcoidosis    Schizophrenia ([place])    Sleep apnea    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   Total self-care deficit    per caregiver he needs to have help bathing and cleaning self and needs help with all daily needs now    Past Surgical History:  Procedure Laterality Date   BALLOON DILATION N/A 07/26/2021   Procedure: BALLOON DILATION;  Surgeon: Mauri Pole, MD;  Location: [facility];  Service: Endoscopy;  Laterality: N/A;   BIOPSY  07/26/2021   Procedure: BIOPSY;  Surgeon: Mauri Pole, MD;  Location: [facility];  Service: Endoscopy;;   COLECTOMY N/A 09/25/2014   Procedure: OPEN ABDOMINAL COLECTOMY;  Surgeon: Michael Boston, MD;  Location: [facility] ORS;  Service: General;  Laterality: N/A;   COLONOSCOPY  2012   FLEXIBLE SIGMOIDOSCOPY N/A 07/26/2021   Procedure: FLEXIBLE SIGMOIDOSCOPY;  Surgeon: Mauri Pole, MD;  Location: [facility];  Service: Endoscopy;  Laterality: N/A;   groin surgery     boil drained   MOUTH SURGERY     PROCTOSCOPY N/A 09/25/2014   Procedure: RIGID PROCTOSCOPY;  Surgeon: Michael Boston, MD;  Location: [facility];  Service: General;  Laterality: N/A;   Patient Active Problem List   Diagnosis Date Noted   Rectal stricture    Encounter for colorectal cancer screening 02/09/2021   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[place]) 05/03/2020   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   Incontinent of feces    Total self-care deficit    S/P total colectomy 07/20/2017   Dizziness 10/12/2016   Anemia of chronic disease 10/05/2014  Colonic inertia s/p abdominal colectomy 09/25/2014 09/25/2014   Hyperlipidemia 09/17/2008   Glaucoma 10/10/2006   Sarcoidosis 06/11/2006   Schizophrenia, unspecified type ([place]) 06/11/2006   Intellectual disability 37/54/3606   Chronic systolic CHF (congestive heart failure) ([place]) 06/11/2006   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    REFERRING DIAG: gait instability /balance issues High fall risk  THERAPY DIAG:  Difficulty in walking, not elsewhere classified - Plan: PT plan of care cert/re-cert  Muscle weakness (generalized) - Plan: PT plan of care cert/re-cert  Stiffness of left knee, not elsewhere classified - Plan: PT plan of care cert/re-cert  Stiffness of right knee, not elsewhere classified - Plan: PT plan of care cert/re-cert  Rationale for Evaluation and Treatment  Rehabilitation  PERTINENT HISTORY: Mental retardation, Glaucoma, obesity, Cardiomyopathy, CHF, Schizophrenia, Total self-care deficit   PRECAUTIONS: Fall  SUBJECTIVE: Pt presents to PT with no current reports of pain or discomfort. Has worked on some of HEP with no adverse effect noted. Pt is ready to begin PT at this time.   PAIN:  Are you having pain? No   OBJECTIVE: (objective measures completed at initial evaluation unless otherwise dated)  DIAGNOSTIC FINDINGS: none relevant   PATIENT SURVEYS:  NA   COGNITION:           Overall cognitive status: Impaired                        SENSATION: WFL   EDEMA:  NT   MUSCLE LENGTH: Hamstrings: Right tight deg; Left tight deg Thomas test: Right tight deg; Left tight deg   POSTURE: increased thoracic kyphosis, anterior pelvic tilt, flexed trunk , and flexed kness   PALPATION: NT   LOWER EXTREMITY ROM:   Active ROM Right eval Left eval    Hip flexion        Hip extension        Hip abduction        Hip adduction        Hip internal rotation        Hip external rotation        Knee flexion        Knee extension -10 -10    Ankle dorsiflexion        Ankle plantarflexion        Ankle inversion        Ankle eversion         (Blank rows = not tested)   LOWER EXTREMITY MMT:   MMT Right eval Left eval Right 05/18/2022 Left 05/18/2022  Hip flexion 3- 3- 4+/5 4+/5  Hip extension 3- 3- 4+/5 4+/5  Hip abduction 3- 3- 4+/5 4+/5  Hip adduction        Hip internal rotation        Hip external rotation 3 3 4/5 4/5  Knee flexion 4 4 4/5 4/5  Knee extension 4 4    Ankle dorsiflexion        Ankle plantarflexion        Ankle inversion        Ankle eversion         (Blank rows = not tested)   LOWER EXTREMITY SPECIAL TESTS:  NT   FUNCTIONAL TESTS:  5xSTS= 20 seconds - no UE support    Quick Balance Tests: Standing 2 apart= 2 mins no unsteadiness            Standing feet together= 2 mins no unsteadiness  Standing eyes close, feet apart= no unsteadiness for 10 sec SLS = 1 sec bilat   Berg: TBA   Coordination: Decreased pace c finger to nose; Dysdiadochokinesis   GAIT: Distance walked: 253f Assistive device utilized: WEnvironmental consultant- 4 wheeled Level of assistance: SBA Comments: flexed trunk, hips, knees and out toeing    TODAY'S TREATMENT: [facility] Adult PT Treatment:                                                DATE: 05/18/2022 Therapeutic Exercise: Bridge 2x10  Supine ball squeeze 2x10 - 3" hold  Hooklying clamshell 2x15 BTB Supine SLR 2x10 each STS 3x5 Standing hip abd 2x10 25# Step ups 2x10 8in fwd each Therapeutic Activity: Assessment of tests/measures, goals and outcomes for progress note   [facility] Adult PT Treatment:                                                DATE: 05/11/2022 Therapeutic Exercise: Mini-squat side steps 3x5 length of mat table Standing Cybex hip abduction with 25# 2x10 BIL Standing Cybex hip extension with 25# 2x10 BIL Neuromuscular re-ed: Romberg stance with alternating random perturbations to torso in different directions with cues to correct using hips 3x30 Standing marching with slow leg lowering with tactile cues to touch knee to PT's hand 2x20 Step up, backward step down on Airex pad 2x10 BIL Lateral step up, step down on Airex pad x10 BIL  [facility] Adult PT Treatment:                                                DATE: 04/25/2022 Therapeutic Exercise: Bridge 2x10  Supine ball squeeze 2x10 - 3" hold  Hooklying Clamshell with Resistance  GTB 10 reps - 3 hold Supine SLR 2x10 each Standing marches in // x 20 Lateral walk in // 1 UE support Standing hip abd/ext x 10 each FT on foam 2x30" Step ups fwd x 10 8in  [facility] Adult PT Treatment:                                                DATE: 9Sep 16, 2023Therapeutic Exercise: Dead lift with 15# kettlebell 3x8 Standing marching on Airex pad 2x30sec Heel raises on Airex pad 2x10 Standing hip extension with 7#  cable to ankle attachment 2x10 BIL    PATIENT EDUCATION:  Education details:HEP update Person educated: Patient and Caregiver sister Education method: Explanation, Demonstration, TCorporate treasurercues, Verbal cues, and Handouts Education comprehension: verbalized understanding, returned demonstration, verbal cues required, and tactile cues required     HOME EXERCISE PROGRAM: Access Code: [email or web address] URL: [email or web address] Date: 05/18/2022 Prepared by: DOctavio Manns Exercises - Supine Bridge  - 1 x daily - 7 x weekly - 2 sets - 10 reps - 5 hold - Hooklying Clamshell with Resistance  - 1 x daily - 7 x weekly - 2 sets - 10 reps - 3 hold - Supine Straight Leg Raises  -  1 x daily - 7 x weekly - 2 sets - 10 reps - 3 hold - Sit to Stand with Counter Support  - 1 x daily - 7 x weekly - 2 sets - 10 reps - 3 hold - Seated Hamstring Stretch  - 2 x daily - 7 x weekly - 2 reps - 30 sec hold   ASSESSMENT:   CLINICAL IMPRESSION: Pt was able to complete all prescribed exercises with no adverse effect or increase in pain. Over the course of PT treatment he is progressing fairly well, showing improving LE strength and improving functional mobility. He does continue to benefit from skilled PT, will decrease frequency and continue to progress as able.    OBJECTIVE IMPAIRMENTS decreased activity tolerance, decreased balance, decreased coordination, decreased knowledge of condition, decreased mobility, difficulty walking, decreased ROM, decreased strength, decreased safety awareness, and postural dysfunction.    ACTIVITY LIMITATIONS carrying, lifting, bending, standing, squatting, stairs, transfers, bathing, toileting, dressing, self feeding, and locomotion level   PARTICIPATION LIMITATIONS: meal prep, cleaning, laundry, and community activity   PERSONAL FACTORS Behavior pattern, Fitness, Past/current experiences, Time since onset of injury/illness/exacerbation, and 3+ comorbidities:    Mental  retardation, Glaucoma, obesity, Cardiomyopathy, CHF, Schizophrenia, Total self-care deficit are also affecting patient's functional outcome.        GOALS:   SHORT TERM GOALS: Target date: 04/27/2022  Pt and CG will be Ind in an initial HEP Baseline:Started Goal status: MET   LONG TERM GOALS: Target date: 05/26/22    Pt will be Ind in a final HEP to maintain achieved LOF  Baseline: Started Goal status: ONGOING   2.  Increase knee ext AROM to -5d and improved hip ext ROM for improved posture and gait qaulity Baseline: bilat knee ext -10d  05/18/2022: see chart Goal status: ONGOING   3.  Improve SLS to 4 sec or greater for improved balance and safety Baseline: 1 sec each 05/18/2022: 1 sec  Goal status: ONGOING   4.  Increase bilat knee and hip strength to 4+ and 4- respectively for improved balane and safety  Baseline: See flow sheets 05/18/2022: see chart Goal status: MET   5.  Improve 5xSTS by MCID of 5" as indication of improved functional mobility Baseline: 23.8 sec c armrest assist 05/18/2022: 20 sec Goal status: ONGOING     PLAN: PT FREQUENCY: 1x/week   PT DURATION: 4 weeks   PLANNED INTERVENTIONS: Therapeutic exercises, Therapeutic activity, Neuromuscular re-education, Balance training, Gait training, Patient/Family education, Self Care, Joint mobilization, Manual therapy, and Re-evaluation   PLAN FOR NEXT SESSION: Assess Berg, Assess response to HEP; progress therex as indicated; use of modalities and manual therapy as indicated.    Ward Chatters PT  05/18/22 4:27 PM

## 2022-05-19 ENCOUNTER — Telehealth: Payer: Self-pay

## 2022-05-19 NOTE — Telephone Encounter (Signed)
Patient's guardian [name] left a VM after hours yesterday 05/18/22 relaying that patient had seen his dentist recently and it was discovered that his #4 tooth was broken in half. Patient's dentist wanted to extract what was left of the tooth, but [name] asked that they wait until [name] could be updated.   Returned [name]'s call today 05/19/22 but she did not answer. Left a detailed message letting her know that her message was received and would be relayed to [name] to address when she's back in the office Monday 05/22/22. Encouraged her to call back should she have any additional questions or concerns, but informed her someone from our clinic would reach back out with Dr. Pearlie Oyster recommendations early next week.

## 2022-05-22 ENCOUNTER — Encounter: Payer: Self-pay | Admitting: Radiation Oncology

## 2022-05-22 NOTE — Progress Notes (Addendum)
[name] Branch 1965-09-13   Patient received 51Gy/20 fractions (hypofractionated radiation at 2.55Gy/fraction) in 2021 - 2 years ago - for oral cancer.  I have been informed that he has a broken teeth, #4, for which extraction has been recommended.  Below are highlights from his plan for dentistry to review.  Again, note the patient was treated with hypofractionation. -----------------------------------  Eppie Gibson, MD

## 2022-05-24 NOTE — Progress Notes (Signed)
Oncology Nurse Navigator Documentation   I left a voicemail for [name] Branch, [name] legal guardian. I let her know that [name] recommends evaluation by Dr. Sandi Mariscal DDS before any possible teeth extractions due to his history of radiation and Dr. Raynelle Dick expertise with patient who have received radiation in the past. I provided her with Dr. Raynelle Dick clinic number and my direct contact information if she should have any questions.   Harlow Asa RN, BSN, OCN Head & Neck Oncology Nurse [place] [facility] Phone # [phone]  Fax # [phone]

## 2022-05-25 ENCOUNTER — Ambulatory Visit: Payer: Medicare Other

## 2022-06-01 ENCOUNTER — Ambulatory Visit: Payer: Medicare Other | Attending: Family Medicine

## 2022-06-01 DIAGNOSIS — M6281 Muscle weakness (generalized): Secondary | ICD-10-CM | POA: Insufficient documentation

## 2022-06-01 DIAGNOSIS — M25661 Stiffness of right knee, not elsewhere classified: Secondary | ICD-10-CM | POA: Insufficient documentation

## 2022-06-01 DIAGNOSIS — M25662 Stiffness of left knee, not elsewhere classified: Secondary | ICD-10-CM | POA: Insufficient documentation

## 2022-06-01 DIAGNOSIS — R262 Difficulty in walking, not elsewhere classified: Secondary | ICD-10-CM | POA: Diagnosis not present

## 2022-06-01 NOTE — Therapy (Signed)
OUTPATIENT PHYSICAL THERAPY TREATMENT NOTE/PROGRESS NOTE  Progress Note Reporting Period 04/05/2022 to 05/18/2022  See note below for Objective Data and Assessment of Progress/Goals.     Patient Name: [name] Branch MRN: [id number] DOB:July 28, 1966, 56 y.o., male Today's Date: 06/01/2022  PCP: [name], MD REFERRING PROVIDER: Michae Kava, MD  END OF SESSION:   PT End of Session - 06/01/22 1706     Visit Number 6    Number of Visits 13    Date for PT Re-Evaluation 06/16/22    Authorization Type UHC MEDICARE, MEDICAID OF [place]    Authorization Time Period FOTO v6, v10, kx mod v15    Progress Note Due on Visit 15    PT Start Time 1705    PT Stop Time 1743    PT Time Calculation (min) 38 min    Equipment Utilized During Treatment Gait belt    Activity Tolerance Patient tolerated treatment well    Behavior During Therapy WFL for tasks assessed/performed                 Past Medical History:  Diagnosis Date   ABSCESS, ANAL/RECTAL REGIONS 08/18/2006   Annotation: Fournier gangrene Qualifier: History of  By: Garnette Scheuermann MD, Yogesh     Allergy    Asthma    Cancer [facility])    Cardiomyopathy    Cataract    CHF (congestive heart failure) ([place])    Colonic inertia    Constipation    Emphysema of lung ([place])    GERD (gastroesophageal reflux disease)    Glaucoma    Hyperlipidemia    Hypovolemic shock ([place])    Incontinent of feces    Mental retardation    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   Annotation: chronic with stercoral ulcer  Qualifier: Diagnosis of  By: Garnette Scheuermann MD, Yogesh     Obstruction of colon ([place])    recurrent   Sarcoidosis    Schizophrenia ([place])    Sleep apnea    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   Total self-care deficit    per caregiver he needs to have help bathing and cleaning self and needs help with all daily needs now    Past Surgical History:  Procedure Laterality Date   BALLOON DILATION N/A 07/26/2021   Procedure: BALLOON DILATION;  Surgeon:  Mauri Pole, MD;  [facility];  Service: Endoscopy;  Laterality: N/A;   BIOPSY  07/26/2021   Procedure: BIOPSY;  Surgeon: Mauri Pole, MD;  [facility];  Service: Endoscopy;;   COLECTOMY N/A 09/25/2014   Procedure: OPEN ABDOMINAL COLECTOMY;  Surgeon: Michael Boston, MD;  [facility];  Service: General;  Laterality: N/A;   COLONOSCOPY  2012   FLEXIBLE SIGMOIDOSCOPY N/A 07/26/2021   Procedure: FLEXIBLE SIGMOIDOSCOPY;  Surgeon: Mauri Pole, MD;  [facility];  Service: Endoscopy;  Laterality: N/A;   groin surgery     boil drained   MOUTH SURGERY     PROCTOSCOPY N/A 09/25/2014   Procedure: RIGID PROCTOSCOPY;  Surgeon: Michael Boston, MD;  [facility];  Service: General;  Laterality: N/A;   Patient Active Problem List   Diagnosis Date Noted   Rectal stricture    Encounter for colorectal cancer screening 02/09/2021   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   Incontinent of feces    Total self-care deficit    S/P total colectomy 07/20/2017   Dizziness 10/12/2016   Anemia of chronic disease  10/05/2014   Colonic inertia s/p abdominal colectomy 09/25/2014 09/25/2014   Hyperlipidemia 09/17/2008   Glaucoma 10/10/2006   Sarcoidosis 06/11/2006   Schizophrenia, unspecified type ([place]) 06/11/2006   Intellectual disability 11/57/2620   Chronic systolic CHF (congestive heart failure) ([place]) 06/11/2006   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    REFERRING DIAG: gait instability /balance issues High fall risk  THERAPY DIAG:  Difficulty in walking, not elsewhere classified  Muscle weakness (generalized)  Stiffness of left knee, not elsewhere classified  Stiffness of right knee, not elsewhere classified  Rationale for Evaluation and Treatment Rehabilitation  PERTINENT HISTORY: Mental retardation, Glaucoma, obesity, Cardiomyopathy, CHF, Schizophrenia, Total self-care deficit    PRECAUTIONS: Fall  SUBJECTIVE: Pt reports feeling wonderful today. He denies any pain. He also denies any falls recently.   PAIN:  Are you having pain? No   OBJECTIVE: (objective measures completed at initial evaluation unless otherwise dated)  DIAGNOSTIC FINDINGS: none relevant   PATIENT SURVEYS:  NA   COGNITION:           Overall cognitive status: Impaired                        SENSATION: WFL   EDEMA:  NT   MUSCLE LENGTH: Hamstrings: Right tight deg; Left tight deg Thomas test: Right tight deg; Left tight deg   POSTURE: increased thoracic kyphosis, anterior pelvic tilt, flexed trunk , and flexed kness   PALPATION: NT   LOWER EXTREMITY ROM:   Active ROM Right eval Left eval    Hip flexion        Hip extension        Hip abduction        Hip adduction        Hip internal rotation        Hip external rotation        Knee flexion        Knee extension -10 -10    Ankle dorsiflexion        Ankle plantarflexion        Ankle inversion        Ankle eversion         (Blank rows = not tested)   LOWER EXTREMITY MMT:   MMT Right eval Left eval Right 05/18/2022 Left 05/18/2022  Hip flexion 3- 3- 4+/5 4+/5  Hip extension 3- 3- 4+/5 4+/5  Hip abduction 3- 3- 4+/5 4+/5  Hip adduction        Hip internal rotation        Hip external rotation 3 3 4/5 4/5  Knee flexion 4 4 4/5 4/5  Knee extension 4 4    Ankle dorsiflexion        Ankle plantarflexion        Ankle inversion        Ankle eversion         (Blank rows = not tested)   LOWER EXTREMITY SPECIAL TESTS:  NT   FUNCTIONAL TESTS:  5xSTS= 20 seconds - no UE support    Quick Balance Tests: Standing 2 apart= 2 mins no unsteadiness            Standing feet together= 2 mins no unsteadiness            Standing eyes close, feet apart= no unsteadiness for 10 sec SLS = 1 sec bilat   Berg: TBA   Coordination: Decreased pace c finger to nose; Dysdiadochokinesis   GAIT: Distance walked:  256f Assistive  device utilized: Environmental consultant - 4 wheeled Level of assistance: SBA Comments: flexed trunk, hips, knees and out toeing    TODAY'S TREATMENT:  [facility] Adult PT Treatment:                                                DATE: 06/01/2022 Therapeutic Exercise: 15# kettlebell deadlift 3x10 Standing hip flexion with 7# cable to ankle 2x10 BIL Standing hip abduction with 7# cable to ankle 2x10 BIL Standing hip extension with 7# cable to ankle 2x10 BIL Standing marching 2x20 Manual Therapy: N/A Neuromuscular re-ed: N/A Therapeutic Activity: N/A Modalities: N/A Self Care: N/A   [facility] Adult PT Treatment:                                                DATE: 05/18/2022 Therapeutic Exercise: Bridge 2x10  Supine ball squeeze 2x10 - 3" hold  Hooklying clamshell 2x15 BTB Supine SLR 2x10 each STS 3x5 Standing hip abd 2x10 25# Step ups 2x10 8in fwd each Therapeutic Activity: Assessment of tests/measures, goals and outcomes for progress note   [facility] Adult PT Treatment:                                                DATE: 05/11/2022 Therapeutic Exercise: Mini-squat side steps 3x5 length of mat table Standing Cybex hip abduction with 25# 2x10 BIL Standing Cybex hip extension with 25# 2x10 BIL Neuromuscular re-ed: Romberg stance with alternating random perturbations to torso in different directions with cues to correct using hips 3x30 Standing marching with slow leg lowering with tactile cues to touch knee to PT's hand 2x20 Step up, backward step down on Airex pad 2x10 BIL Lateral step up, step down on Airex pad x10 BIL     PATIENT EDUCATION:  Education details:HEP update Person educated: Patient and Caregiver sister Education method: Explanation, Demonstration, Corporate treasurer cues, Verbal cues, and Handouts Education comprehension: verbalized understanding, returned demonstration, verbal cues required, and tactile cues required     HOME EXERCISE PROGRAM: Access Code: [email or web address] URL:  [email or web address] Date: 05/18/2022 Prepared by: Octavio Manns  Exercises - Supine Bridge  - 1 x daily - 7 x weekly - 2 sets - 10 reps - 5 hold - Hooklying Clamshell with Resistance  - 1 x daily - 7 x weekly - 2 sets - 10 reps - 3 hold - Supine Straight Leg Raises  - 1 x daily - 7 x weekly - 2 sets - 10 reps - 3 hold - Sit to Stand with Counter Support  - 1 x daily - 7 x weekly - 2 sets - 10 reps - 3 hold - Seated Hamstring Stretch  - 2 x daily - 7 x weekly - 2 reps - 30 sec hold   ASSESSMENT:   CLINICAL IMPRESSION: Pt responded well to all interventions today, demonstrating good form and improved standing endurance with exercises. He will continue to benefit from skilled PT to address his primary impairments and return to his prior level of function with less limitation.   OBJECTIVE IMPAIRMENTS decreased activity tolerance, decreased balance, decreased coordination, decreased knowledge of  condition, decreased mobility, difficulty walking, decreased ROM, decreased strength, decreased safety awareness, and postural dysfunction.    ACTIVITY LIMITATIONS carrying, lifting, bending, standing, squatting, stairs, transfers, bathing, toileting, dressing, self feeding, and locomotion level   PARTICIPATION LIMITATIONS: meal prep, cleaning, laundry, and community activity   PERSONAL FACTORS Behavior pattern, Fitness, Past/current experiences, Time since onset of injury/illness/exacerbation, and 3+ comorbidities:    Mental retardation, Glaucoma, obesity, Cardiomyopathy, CHF, Schizophrenia, Total self-care deficit are also affecting patient's functional outcome.        GOALS:   SHORT TERM GOALS: Target date: 04/27/2022  Pt and CG will be Ind in an initial HEP Baseline:Started Goal status: MET   LONG TERM GOALS: Target date: 05/26/22    Pt will be Ind in a final HEP to maintain achieved LOF  Baseline: Started Goal status: ONGOING   2.  Increase knee ext AROM to -5d and  improved hip ext ROM for improved posture and gait qaulity Baseline: bilat knee ext -10d  05/18/2022: see chart Goal status: ONGOING   3.  Improve SLS to 4 sec or greater for improved balance and safety Baseline: 1 sec each 05/18/2022: 1 sec  Goal status: ONGOING   4.  Increase bilat knee and hip strength to 4+ and 4- respectively for improved balane and safety  Baseline: See flow sheets 05/18/2022: see chart Goal status: MET   5.  Improve 5xSTS by MCID of 5" as indication of improved functional mobility Baseline: 23.8 sec c armrest assist 05/18/2022: 20 sec Goal status: ONGOING     PLAN: PT FREQUENCY: 1x/week   PT DURATION: 4 weeks   PLANNED INTERVENTIONS: Therapeutic exercises, Therapeutic activity, Neuromuscular re-education, Balance training, Gait training, Patient/Family education, Self Care, Joint mobilization, Manual therapy, and Re-evaluation   PLAN FOR NEXT SESSION: Assess Berg, Assess response to HEP; progress therex as indicated; use of modalities and manual therapy as indicated.   Vanessa [place], PT, DPT 06/01/22 5:45 PM

## 2022-06-02 ENCOUNTER — Telehealth: Payer: Self-pay

## 2022-06-02 NOTE — Telephone Encounter (Signed)
Rn contacted pt caregiver [name] after talking to [name] about pt dental clearance for removal of tooth number 4. Rn left message with details for dental office at [facility]. Rn encouraged pt/ family to call back with any questions or concerns.

## 2022-06-07 ENCOUNTER — Ambulatory Visit: Payer: Medicare Other

## 2022-06-07 DIAGNOSIS — R262 Difficulty in walking, not elsewhere classified: Secondary | ICD-10-CM

## 2022-06-07 DIAGNOSIS — M25662 Stiffness of left knee, not elsewhere classified: Secondary | ICD-10-CM

## 2022-06-07 DIAGNOSIS — M6281 Muscle weakness (generalized): Secondary | ICD-10-CM

## 2022-06-07 DIAGNOSIS — M25661 Stiffness of right knee, not elsewhere classified: Secondary | ICD-10-CM

## 2022-06-07 NOTE — Therapy (Signed)
OUTPATIENT PHYSICAL THERAPY TREATMENT NOTE/PROGRESS NOTE  Progress Note Reporting Period 04/05/2022 to 05/18/2022  See note below for Objective Data and Assessment of Progress/Goals.     Patient Name: [name] Branch MRN: [id number] DOB:1965/10/23, 56 y.o., male Today's Date: 06/07/2022  PCP: [name], MD REFERRING PROVIDER: Michae Kava, MD  END OF SESSION:   PT End of Session - 06/07/22 1652     Visit Number 7    Number of Visits 13    Date for PT Re-Evaluation 06/16/22    Authorization Type UHC MEDICARE, MEDICAID OF [place]    Authorization Time Period FOTO v6, v10, kx mod v15    Progress Note Due on Visit 15    PT Start Time 1701    PT Stop Time 1740    PT Time Calculation (min) 39 min    Equipment Utilized During Treatment Gait belt    Activity Tolerance Patient tolerated treatment well    Behavior During Therapy WFL for tasks assessed/performed                  Past Medical History:  Diagnosis Date   ABSCESS, ANAL/RECTAL REGIONS 08/18/2006   Annotation: Fournier gangrene Qualifier: History of  By: Garnette Scheuermann MD, Yogesh     Allergy    Asthma    Cancer 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   Annotation: chronic with stercoral ulcer  Qualifier: Diagnosis of  By: Garnette Scheuermann MD, Yogesh     Obstruction of colon ([place])    recurrent   Sarcoidosis    Schizophrenia ([place])    Sleep apnea    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   Total self-care deficit    per caregiver he needs to have help bathing and cleaning self and needs help with all daily needs now    Past Surgical History:  Procedure Laterality Date   BALLOON DILATION N/A 07/26/2021   Procedure: BALLOON DILATION;  Surgeon:  Mauri Pole, MD;  [facility];  Service: Endoscopy;  Laterality: N/A;   BIOPSY  07/26/2021   Procedure: BIOPSY;  Surgeon: Mauri Pole, MD;  [facility];  Service: Endoscopy;;   COLECTOMY N/A 09/25/2014   Procedure: OPEN ABDOMINAL COLECTOMY;  Surgeon: Michael Boston, MD;  [facility];  Service: General;  Laterality: N/A;   COLONOSCOPY  2012   FLEXIBLE SIGMOIDOSCOPY N/A 07/26/2021   Procedure: FLEXIBLE SIGMOIDOSCOPY;  Surgeon: Mauri Pole, MD;  [facility];  Service: Endoscopy;  Laterality: N/A;   groin surgery     boil drained   MOUTH SURGERY     PROCTOSCOPY N/A 09/25/2014   Procedure: RIGID PROCTOSCOPY;  Surgeon: Michael Boston, MD;  [facility];  Service: General;  Laterality: N/A;   Patient Active Problem List   Diagnosis Date Noted   Rectal stricture    Encounter for colorectal cancer screening 02/09/2021   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   Incontinent of feces    Total self-care deficit    S/P total colectomy 07/20/2017   Dizziness 10/12/2016   Anemia of chronic  disease 10/05/2014   Colonic inertia s/p abdominal colectomy 09/25/2014 09/25/2014   Hyperlipidemia 09/17/2008   Glaucoma 10/10/2006   Sarcoidosis 06/11/2006   Schizophrenia, unspecified type ([place]) 06/11/2006   Intellectual disability 13/02/6577   Chronic systolic CHF (congestive heart failure) ([place]) 06/11/2006   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    REFERRING DIAG: gait instability /balance issues High fall risk  THERAPY DIAG:  Difficulty in walking, not elsewhere classified  Muscle weakness (generalized)  Stiffness of left knee, not elsewhere classified  Stiffness of right knee, not elsewhere classified  Rationale for Evaluation and Treatment Rehabilitation  PERTINENT HISTORY: Mental retardation, Glaucoma, obesity, Cardiomyopathy, CHF, Schizophrenia, Total self-care deficit    PRECAUTIONS: Fall  SUBJECTIVE: Pt reports he has been doing "wonderful." He denies any falls or LOB recently.   PAIN:  Are you having pain? No   OBJECTIVE: (objective measures completed at initial evaluation unless otherwise dated)  DIAGNOSTIC FINDINGS: none relevant   PATIENT SURVEYS:  NA   COGNITION:           Overall cognitive status: Impaired                        SENSATION: WFL   EDEMA:  NT   MUSCLE LENGTH: Hamstrings: Right tight deg; Left tight deg Thomas test: Right tight deg; Left tight deg   POSTURE: increased thoracic kyphosis, anterior pelvic tilt, flexed trunk , and flexed kness   PALPATION: NT   LOWER EXTREMITY ROM:   Active ROM Right eval Left eval    Hip flexion        Hip extension        Hip abduction        Hip adduction        Hip internal rotation        Hip external rotation        Knee flexion        Knee extension -10 -10    Ankle dorsiflexion        Ankle plantarflexion        Ankle inversion        Ankle eversion         (Blank rows = not tested)   LOWER EXTREMITY MMT:   MMT Right eval Left eval Right 05/18/2022 Left 05/18/2022  Hip flexion 3- 3- 4+/5 4+/5  Hip extension 3- 3- 4+/5 4+/5  Hip abduction 3- 3- 4+/5 4+/5  Hip adduction        Hip internal rotation        Hip external rotation 3 3 4/5 4/5  Knee flexion 4 4 4/5 4/5  Knee extension 4 4    Ankle dorsiflexion        Ankle plantarflexion        Ankle inversion        Ankle eversion         (Blank rows = not tested)   LOWER EXTREMITY SPECIAL TESTS:  NT   FUNCTIONAL TESTS:  5xSTS= 20 seconds - no UE support    Quick Balance Tests: Standing 2 apart= 2 mins no unsteadiness            Standing feet together= 2 mins no unsteadiness            Standing eyes close, feet apart= no unsteadiness for 10 sec SLS = 1 sec bilat   Berg: TBA   Coordination: Decreased pace c finger to nose; Dysdiadochokinesis   GAIT: Distance walked: 261f Assistive device  utilized: Environmental consultant - 4 wheeled Level of assistance: SBA Comments: flexed trunk, hips, knees and out toeing    TODAY'S TREATMENT:  [facility] Adult PT Treatment:                                                DATE: 06/07/2022 Therapeutic Exercise: Supine 90/90 abdominal hold 3x30 seconds Bridge 3x10 Bouncing heel raises with UE support 3x30 25# kettlebell deadlift 3x10 4-inch step-up/ backward step-down 2x10 BIL Manual Therapy: N/A Neuromuscular re-ed: N/A Therapeutic Activity: N/A Modalities: N/A Self Care: N/A   [facility] Adult PT Treatment:                                                DATE: 06/01/2022 Therapeutic Exercise: 15# kettlebell deadlift 3x10 Standing hip flexion with 7# cable to ankle 2x10 BIL Standing hip abduction with 7# cable to ankle 2x10 BIL Standing hip extension with 7# cable to ankle 2x10 BIL Standing marching 2x20 Manual Therapy: N/A Neuromuscular re-ed: N/A Therapeutic Activity: N/A Modalities: N/A Self Care: N/A   [facility] Adult PT Treatment:                                                DATE: 05/18/2022 Therapeutic Exercise: Bridge 2x10  Supine ball squeeze 2x10 - 3" hold  Hooklying clamshell 2x15 BTB Supine SLR 2x10 each STS 3x5 Standing hip abd 2x10 25# Step ups 2x10 8in fwd each Therapeutic Activity: Assessment of tests/measures, goals and outcomes for progress note       PATIENT EDUCATION:  Education details:HEP update Person educated: Patient and Caregiver sister Education method: Explanation, Demonstration, Tactile cues, Verbal cues, and Handouts Education comprehension: verbalized understanding, returned demonstration, verbal cues required, and tactile cues required     HOME EXERCISE PROGRAM: Access Code: [email or web address] URL: [email or web address] Date: 05/18/2022 Prepared by: Octavio Manns  Exercises - Supine Bridge  - 1 x daily - 7 x weekly - 2 sets - 10 reps - 5 hold - Hooklying Clamshell with Resistance  - 1 x daily -  7 x weekly - 2 sets - 10 reps - 3 hold - Supine Straight Leg Raises  - 1 x daily - 7 x weekly - 2 sets - 10 reps - 3 hold - Sit to Stand with Counter Support  - 1 x daily - 7 x weekly - 2 sets - 10 reps - 3 hold - Seated Hamstring Stretch  - 2 x daily - 7 x weekly - 2 reps - 30 sec hold  Added 06/07/2022: - Supine 90/90 Abdominal Bracing  - 1 x daily - 7 x weekly - 3 sets - 30 seconds hold   ASSESSMENT:   CLINICAL IMPRESSION: Pt responded well to all interventions, demonstrating good form and no pain throughout the session. He will continue to benefit from skilled PT to address his primary impairments and return to his prior level of function with less limitation.   OBJECTIVE IMPAIRMENTS decreased activity tolerance, decreased balance, decreased coordination, decreased knowledge of condition, decreased mobility, difficulty walking, decreased ROM, decreased strength, decreased safety awareness, and postural dysfunction.  ACTIVITY LIMITATIONS carrying, lifting, bending, standing, squatting, stairs, transfers, bathing, toileting, dressing, self feeding, and locomotion level   PARTICIPATION LIMITATIONS: meal prep, cleaning, laundry, and community activity   PERSONAL FACTORS Behavior pattern, Fitness, Past/current experiences, Time since onset of injury/illness/exacerbation, and 3+ comorbidities:    Mental retardation, Glaucoma, obesity, Cardiomyopathy, CHF, Schizophrenia, Total self-care deficit are also affecting patient's functional outcome.        GOALS:   SHORT TERM GOALS: Target date: 04/27/2022  Pt and CG will be Ind in an initial HEP Baseline:Started Goal status: MET   LONG TERM GOALS: Target date: 05/26/22    Pt will be Ind in a final HEP to maintain achieved LOF  Baseline: Started Goal status: ONGOING   2.  Increase knee ext AROM to -5d and improved hip ext ROM for improved posture and gait qaulity Baseline: bilat knee ext -10d  05/18/2022: see chart Goal status: ONGOING    3.  Improve SLS to 4 sec or greater for improved balance and safety Baseline: 1 sec each 05/18/2022: 1 sec  Goal status: ONGOING   4.  Increase bilat knee and hip strength to 4+ and 4- respectively for improved balane and safety  Baseline: See flow sheets 05/18/2022: see chart Goal status: MET   5.  Improve 5xSTS by MCID of 5" as indication of improved functional mobility Baseline: 23.8 sec c armrest assist 05/18/2022: 20 sec Goal status: ONGOING     PLAN: PT FREQUENCY: 1x/week   PT DURATION: 4 weeks   PLANNED INTERVENTIONS: Therapeutic exercises, Therapeutic activity, Neuromuscular re-education, Balance training, Gait training, Patient/Family education, Self Care, Joint mobilization, Manual therapy, and Re-evaluation   PLAN FOR NEXT SESSION: Assess Berg, Assess response to HEP; progress therex as indicated; use of modalities and manual therapy as indicated.   Vanessa [place], PT, DPT 06/07/22 5:40 PM

## 2022-06-14 ENCOUNTER — Ambulatory Visit: Payer: Medicare Other

## 2022-06-14 DIAGNOSIS — R262 Difficulty in walking, not elsewhere classified: Secondary | ICD-10-CM

## 2022-06-14 DIAGNOSIS — M6281 Muscle weakness (generalized): Secondary | ICD-10-CM

## 2022-06-14 DIAGNOSIS — M25661 Stiffness of right knee, not elsewhere classified: Secondary | ICD-10-CM

## 2022-06-14 DIAGNOSIS — M25662 Stiffness of left knee, not elsewhere classified: Secondary | ICD-10-CM

## 2022-06-14 NOTE — Therapy (Signed)
OUTPATIENT PHYSICAL THERAPY TREATMENT NOTE/ DISCHARGE SUMMARY    Patient Name: [name] Branch MRN: [id number] DOB:May 20, 1966, 56 y.o., male Today's Date: 06/14/2022  PCP: [name], MD REFERRING PROVIDER: Michae Kava, MD  END OF SESSION:   PT End of Session - 06/14/22 1654     Visit Number 8    Number of Visits 13    Date for PT Re-Evaluation 06/16/22    Authorization Type [employer]HC MEDICARE, MEDICAID OF [place]    Authorization Time Period FOTO v6, v10, kx mod v15    Progress Note Due on Visit 15    PT Start Time 1659    PT Stop Time 1738    PT Time Calculation (min) 39 min    Equipment Utilized During Treatment Gait belt    Activity Tolerance Patient tolerated treatment well    Behavior During Therapy WFL for tasks assessed/performed                   Past Medical History:  Diagnosis Date   ABSCESS, ANAL/RECTAL REGIONS 08/18/2006   Annotation: Fournier gangrene Qualifier: History of  By: Garnette Scheuermann MD, Yogesh     Allergy    Asthma    Cancer [facility])    Cardiomyopathy    Cataract    CHF (congestive heart failure) ([place])    Colonic inertia    Constipation    Emphysema of lung ([place])    GERD (gastroesophageal reflux disease)    Glaucoma    Hyperlipidemia    Hypovolemic shock ([place])    Incontinent of feces    Mental retardation    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   Annotation: chronic with stercoral ulcer  Qualifier: Diagnosis of  By: Garnette Scheuermann MD, Yogesh     Obstruction of colon ([place])    recurrent   Sarcoidosis    Schizophrenia ([place])    Sleep apnea    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   Total self-care deficit    per caregiver he needs to have help bathing and cleaning self and needs help with all daily needs now    Past Surgical History:  Procedure Laterality Date   BALLOON DILATION N/A 07/26/2021   Procedure: BALLOON DILATION;  Surgeon: Mauri Pole, MD;  Location: [facility];  Service: Endoscopy;  Laterality: N/A;   BIOPSY  07/26/2021   Procedure:  BIOPSY;  Surgeon: Mauri Pole, MD;  Location: [facility];  Service: Endoscopy;;   COLECTOMY N/A 09/25/2014   Procedure: OPEN ABDOMINAL COLECTOMY;  Surgeon: Michael Boston, MD;  Location: [facility];  Service: General;  Laterality: N/A;   COLONOSCOPY  2012   FLEXIBLE SIGMOIDOSCOPY N/A 07/26/2021   Procedure: FLEXIBLE SIGMOIDOSCOPY;  Surgeon: Mauri Pole, MD;  Location: [facility];  Service: Endoscopy;  Laterality: N/A;   groin surgery     boil drained   MOUTH SURGERY     PROCTOSCOPY N/A 09/25/2014   Procedure: RIGID PROCTOSCOPY;  Surgeon: Michael Boston, MD;  Location: [facility];  Service: General;  Laterality: N/A;   Patient Active Problem List   Diagnosis Date Noted   Rectal stricture    Encounter for colorectal cancer screening 02/09/2021   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   Incontinent of feces    Total self-care deficit    S/P total colectomy 07/20/2017   Dizziness 10/12/2016   Anemia of chronic disease 10/05/2014   Colonic inertia s/p abdominal colectomy 09/25/2014 09/25/2014   Hyperlipidemia 09/17/2008   Glaucoma  10/10/2006   Sarcoidosis 06/11/2006   Schizophrenia, unspecified type ([place]) 06/11/2006   Intellectual disability 76/80/8811   Chronic systolic CHF (congestive heart failure) ([place]) 06/11/2006   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    REFERRING DIAG: gait instability /balance issues High fall risk  THERAPY DIAG:  Difficulty in walking, not elsewhere classified  Muscle weakness (generalized)  Stiffness of left knee, not elsewhere classified  Stiffness of right knee, not elsewhere classified  Rationale for Evaluation and Treatment Rehabilitation  PERTINENT HISTORY: Mental retardation, Glaucoma, obesity, Cardiomyopathy, CHF, Schizophrenia, Total self-care deficit   PRECAUTIONS: Fall  SUBJECTIVE: Pt reports adherence to his HEP, adding that he has no pain currently.  PAIN:  Are you having  pain? No   OBJECTIVE: (objective measures completed at initial evaluation unless otherwise dated)  DIAGNOSTIC FINDINGS: none relevant   PATIENT SURVEYS:  NA   COGNITION:           Overall cognitive status: Impaired                        SENSATION: WFL   EDEMA:  NT   MUSCLE LENGTH: Hamstrings: Right tight deg; Left tight deg Thomas test: Right tight deg; Left tight deg   POSTURE: increased thoracic kyphosis, anterior pelvic tilt, flexed trunk , and flexed kness   PALPATION: NT   LOWER EXTREMITY ROM:   Active ROM Right eval Left eval Right 06/14/2022 A/PROM Left 06/14/2022 A/PROM  Hip flexion        Hip extension        Hip abduction        Hip adduction        Hip internal rotation        Hip external rotation        Knee flexion        Knee extension -10 -10 -2/0 -2/0  Ankle dorsiflexion        Ankle plantarflexion        Ankle inversion        Ankle eversion         (Blank rows = not tested)   LOWER EXTREMITY MMT:   MMT Right eval Left eval Right 05/18/2022 Left 05/18/2022 Right 06/14/2022 Left 06/14/2022  Hip flexion 3- 3- 4+/5 4+/5 4+/5 4+/5  Hip extension 3- 3- 4+/5 4+/5 4+/5 4+/5  Hip abduction 3- 3- 4+/5 4+/5 5/5 5/5  Hip adduction          Hip internal rotation          Hip external rotation 3 3 4/5 4/5 5/5 5/5  Knee flexion 4 4 4/5 4/5 5/5 5/5  Knee extension 4 4   5/5 5/5  Ankle dorsiflexion          Ankle plantarflexion          Ankle inversion          Ankle eversion           (Blank rows = not tested)   LOWER EXTREMITY SPECIAL TESTS:  NT   FUNCTIONAL TESTS:  5xSTS= 20 seconds - no UE support   06/14/2022: 13 seconds with no UE support SLS: 1sec BIL   Quick Balance Tests: Standing 2 apart= 2 mins no unsteadiness            Standing feet together= 2 mins no unsteadiness            Standing eyes close, feet apart= no unsteadiness for 10 sec SLS = 1 sec  bilat   Berg: TBA   Coordination: Decreased pace c finger to  nose; Dysdiadochokinesis   GAIT: Distance walked: 269f Assistive device utilized: WEnvironmental consultant- 4 wheeled Level of assistance: SBA Comments: flexed trunk, hips, knees and out toeing    TODAY'S TREATMENT:  [facility] Adult PT Treatment:                                                DATE: 06/14/2022 Therapeutic Exercise: Standing balance clocks with PT mirror feedback 2x10 BIL Standing marching without UE support 3x20 4-inch step up with UE support 3x10 BIL Sit-to-stand 3x5 Manual Therapy: N/A Neuromuscular re-ed: N/A Therapeutic Activity: Re-assessment of pt goals with pt education Update to HEP with pt education Modalities: N/A Self Care: N/A   [facility] Adult PT Treatment:                                                DATE: 06/07/2022 Therapeutic Exercise: Supine 90/90 abdominal hold 3x30 seconds Bridge 3x10 Bouncing heel raises with UE support 3x30 25# kettlebell deadlift 3x10 4-inch step-up/ backward step-down 2x10 BIL Manual Therapy: N/A Neuromuscular re-ed: N/A Therapeutic Activity: N/A Modalities: N/A Self Care: N/A   [facility] Adult PT Treatment:                                                DATE: 06/01/2022 Therapeutic Exercise: 15# kettlebell deadlift 3x10 Standing hip flexion with 7# cable to ankle 2x10 BIL Standing hip abduction with 7# cable to ankle 2x10 BIL Standing hip extension with 7# cable to ankle 2x10 BIL Standing marching 2x20 Manual Therapy: N/A Neuromuscular re-ed: N/A Therapeutic Activity: N/A Modalities: N/A Self Care: N/A       PATIENT EDUCATION:  Education details:HEP update Person educated: Patient and Caregiver sister Education method: Explanation, Demonstration, Tactile cues, Verbal cues, and Handouts Education comprehension: verbalized understanding, returned demonstration, verbal cues required, and tactile cues required     HOME EXERCISE PROGRAM: Access Code: [email or web address] URL: [email or web address] Date:  05/18/2022 Prepared by: DOctavio Manns Exercises - Supine Bridge  - 1 x daily - 7 x weekly - 2 sets - 10 reps - 5 hold - Hooklying Clamshell with Resistance  - 1 x daily - 7 x weekly - 2 sets - 10 reps - 3 hold - Supine Straight Leg Raises  - 1 x daily - 7 x weekly - 2 sets - 10 reps - 3 hold - Sit to Stand with Counter Support  - 1 x daily - 7 x weekly - 2 sets - 10 reps - 3 hold - Seated Hamstring Stretch  - 2 x daily - 7 x weekly - 2 reps - 30 sec hold  Added 06/07/2022: - Supine 90/90 Abdominal Bracing  - 1 x daily - 7 x weekly - 3 sets - 30 seconds hold   ASSESSMENT:   CLINICAL IMPRESSION: Pt responded well to all interventions, demonstrating good form and no pain throughout the session. Upon re-assessment of objective measures, the pt has met nearly all of his rehab goals, making excellent improvement in LE strength, 5xSTS, and  knee extension AROM. He continues to be limited in SLS. Due to pt feeling ready for discharge and meeting most of his goals, the pt is discharged from PT at this time.    OBJECTIVE IMPAIRMENTS decreased activity tolerance, decreased balance, decreased coordination, decreased knowledge of condition, decreased mobility, difficulty walking, decreased ROM, decreased strength, decreased safety awareness, and postural dysfunction.    ACTIVITY LIMITATIONS carrying, lifting, bending, standing, squatting, stairs, transfers, bathing, toileting, dressing, self feeding, and locomotion level   PARTICIPATION LIMITATIONS: meal prep, cleaning, laundry, and community activity   PERSONAL FACTORS Behavior pattern, Fitness, Past/current experiences, Time since onset of injury/illness/exacerbation, and 3+ comorbidities:    Mental retardation, Glaucoma, obesity, Cardiomyopathy, CHF, Schizophrenia, Total self-care deficit are also affecting patient's functional outcome.        GOALS:   SHORT TERM GOALS: Target date: 04/27/2022  Pt and CG will be Ind in an initial  HEP Baseline:Started Goal status: MET   LONG TERM GOALS: Target date: 05/26/22    Pt will be Ind in a final HEP to maintain achieved LOF  Baseline: Started 06/14/2022: Pt reports continued HEP adherence Goal status: ACHIEVED   2.  Increase knee ext AROM to -5d and improved hip ext ROM for improved posture and gait qaulity Baseline: bilat knee ext -10d  05/18/2022: see chart 06/14/2022: -2 BIL knee extension AROM Goal status: ACHIEVED   3.  Improve SLS to 4 sec or greater for improved balance and safety Baseline: 1 sec each 05/18/2022: 1 sec  06/14/2022: 1 sec Goal status: NOT MET   4.  Increase bilat knee and hip strength to 4+ and 4- respectively for improved balane and safety  Baseline: See flow sheets 05/18/2022: see chart 06/14/2022: See updated MMT chart Goal status: ACHIEVED   5.  Improve 5xSTS by MCID of 5" as indication of improved functional mobility Baseline: 23.8 sec c armrest assist 05/18/2022: 20 sec 06/14/2022: 13 seconds Goal status: ACHIEVED     PLAN: PT FREQUENCY: 1x/week   PT DURATION: 4 weeks   PLANNED INTERVENTIONS: Therapeutic exercises, Therapeutic activity, Neuromuscular re-education, Balance training, Gait training, Patient/Family education, Self Care, Joint mobilization, Manual therapy, and Re-evaluation   PLAN FOR NEXT SESSION: Assess Berg, Assess response to HEP; progress therex as indicated; use of modalities and manual therapy as indicated.   PHYSICAL THERAPY DISCHARGE SUMMARY  Visits from Start of Care: 8  Current functional level related to goals / functional outcomes: Pt has met nearly all of his functional goals.   Remaining deficits: Pt continues to be limited in SLS   Education / Equipment: Updated HEP   Patient agrees to discharge. Patient goals were met. Patient is being discharged due to being pleased with the current functional level.   Vanessa Deerfield, PT, DPT 06/14/22 5:35 PM

## 2022-08-24 ENCOUNTER — Encounter: Payer: Self-pay | Admitting: Cardiology

## 2022-08-24 ENCOUNTER — Ambulatory Visit: Payer: 59 | Admitting: Cardiology

## 2022-08-24 VITALS — BP 134/77 | HR 96 | Resp 18 | Ht 74.0 in | Wt 245.0 lb

## 2022-08-24 DIAGNOSIS — I5032 Chronic diastolic (congestive) heart failure: Secondary | ICD-10-CM

## 2022-08-24 DIAGNOSIS — I4510 Unspecified right bundle-branch block: Secondary | ICD-10-CM

## 2022-08-24 DIAGNOSIS — F209 Schizophrenia, unspecified: Secondary | ICD-10-CM

## 2022-08-24 DIAGNOSIS — E782 Mixed hyperlipidemia: Secondary | ICD-10-CM

## 2022-08-24 DIAGNOSIS — I429 Cardiomyopathy, unspecified: Secondary | ICD-10-CM

## 2022-08-24 MED ORDER — FARXIGA 10 MG PO TABS: 10.0000 mg | ORAL_TABLET | Freq: Every day | ORAL | 0 refills | Status: AC

## 2022-08-24 NOTE — Progress Notes (Signed)
ID:  [name] Branch, DOB 1966-01-31, MRN [id number]  PCP:  [name], MD (Inactive)  Cardiologist: Rex Kras, DO, [facility] (established care 07/12/2020) Former Cardiology Providers: Dr. Cloretta Ned and Vilinda Flake PA.  Date: 08/24/22 Last Office Visit: 02/24/2023  Chief Complaint  Patient presents with   Congestive Heart Failure    HFpEF   Follow-up    6 month    HPI  [name] Branch is a 57 y.o. 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  Given his intellectual disability and schizophrenia his is accompanied by his guardian [name] (sister / guardian) [phone].    He was formally under the care of Dr. Edwin Dada at [facility] and referred to our practice in December 2021 for management of cardiomyopathy presumed to be nonischemic and chronic combined systolic and diastolic heart failure.  Based on EMR patient is noted to have cardiomyopathy since 2016 at the time of colon surgery.  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 Patient's sister states that he subsequently underwent surgery and completed radiation treatment as of 06/16/2020. Since establishing care with myself his GDMT has been uptitrated and he underwent a repeat echocardiogram which notes preserved LVEF of 50-55% and grade 1 diastolic impairment.    Patient presents today for 25-monthfollow-up visit given his history of recovered cardiomyopathy.  Patient remained stable over the last 6 months.  He is walking after every meal and has been cognizant with regards to weight loss and he is down 11 pounds since  last office visit.  He is congratulated on his efforts.  He denies anginal discomfort or heart failure symptoms.  FUNCTIONAL STATUS: Able to ambulate 325mutes five day a week.   ALLERGIES: Allergies  Allergen Reactions   Hydrocodone Other (See Comments)    Exacerbation of urinary and fecal incontinence    MEDICATION LIST PRIOR TO VISIT: Current Meds  Medication Sig   albuterol (PROVENTIL) (2.5 MG/3ML) 0.083% nebulizer solution Take 2.5 mg by nebulization every 6 (six) hours as needed for wheezing or shortness of breath.   albuterol (VENTOLIN HFA) 108 (90 Base) MCG/ACT inhaler Inhale 2 puffs into the lungs every 6 (six) hours as needed for wheezing or shortness of breath.   carbamide peroxide (DEBROX) 6.5 % OTIC solution 5 drops daily as needed (ear wax removal).   carboxymethylcellulose (REFRESH PLUS) 0.5 % SOLN 1 drop at bedtime. Gel   cetirizine (ZYRTEC) 10 MG tablet Take 10 mg by mouth daily.   cloZAPine (CLOZARIL) 100 MG tablet Take 200 mg by mouth See admin instructions. Take with 50 mg for a total of 250 mg at bedtime   clozapine (CLOZARIL) 50 MG tablet Take 50 mg by mouth See admin instructions. Take with (2) 100 mg for a total of 250 mg at bedtime   ENTRESTO 49-51 MG Take 1 tablet by mouth 2 (two) times daily.   FARXIGA 10 MG TABS tablet Take 10 mg by mouth daily.   fluticasone (FLONASE) 50 MCG/ACT nasal spray Place into both nostrils.   fluvoxaMINE (LUVOX) 100 MG tablet Take 300 mg by mouth at bedtime.   ketoconazole (NIZORAL) 2 % cream Apply 1 application topically daily.   metoprolol succinate (TOPROL-XL) 50 MG 24 hr  tablet Take 1 tablet (50 mg total) by mouth in the morning. Hold if top blood pressure number less than 100 mmHg or heart rate less than 60 bpm (pulse).   Multiple Vitamin (MULTIVITAMIN) capsule Take 1 capsule by mouth daily.   pravastatin (PRAVACHOL) 40 MG tablet TAKE ONE TABLET EACH DAY   REFRESH OPTIVE 1-0.9 % GEL Apply 1 drop to eye as directed.   sodium  chloride (OCEAN) 0.65 % nasal spray Place 1 spray into the nose as needed for congestion.   SODIUM FLUORIDE 5000 PLUS 1.1 % CREA dental cream SMARTSIG:Topical   timolol (BETIMOL) 0.5 % ophthalmic solution Place 1 drop into both eyes 2 (two) times daily.   tiotropium (SPIRIVA) 18 MCG inhalation capsule Place 18 mcg into inhaler and inhale daily.   Wheat Dextrin (BENEFIBER ON THE GO) PACK Take 1 packet by mouth 3 (three) times daily as needed.     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  Qualifier: Diagnosis of  By: Garnette Scheuermann MD, Yogesh     Obstruction of colon ([place])    recurrent   Sarcoidosis    Schizophrenia ([place])    Sleep apnea    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   Total self-care deficit    per caregiver he needs to have help bathing and cleaning self and needs help with all daily needs now     PAST SURGICAL HISTORY: Past Surgical History:  Procedure Laterality Date   BALLOON DILATION N/A 07/26/2021   Procedure: BALLOON DILATION;  Surgeon: Mauri Pole, MD;  Location: [facility];  Service: Endoscopy;  Laterality: N/A;   BIOPSY  07/26/2021   Procedure: BIOPSY;  Surgeon: Mauri Pole, MD;  Location: [facility];  Service: Endoscopy;;   COLECTOMY N/A 09/25/2014   Procedure: OPEN ABDOMINAL COLECTOMY;  Surgeon: Michael Boston, MD;  Location: [facility];  Service: General;  Laterality: N/A;   COLONOSCOPY  2012   FLEXIBLE SIGMOIDOSCOPY N/A 07/26/2021    Procedure: FLEXIBLE SIGMOIDOSCOPY;  Surgeon: Mauri Pole, MD;  Location: [facility];  Service: Endoscopy;  Laterality: N/A;   groin surgery     boil drained   MOUTH SURGERY     PROCTOSCOPY N/A 09/25/2014   Procedure: RIGID PROCTOSCOPY;  Surgeon: Michael Boston, MD;  Location: [facility];  Service: General;  Laterality: N/A;    FAMILY HISTORY: The patient family history includes Asthma in his mother; Diabetes in his mother and sister; Heart attack in his brother; Heart failure in his mother; Hypertension in his mother, sister, and sister; Stroke in his mother.  SOCIAL HISTORY:  The patient  reports that he has never smoked. He has never used smokeless tobacco. He reports that he does not drink alcohol and does not use drugs.  REVIEW OF SYSTEMS: Review of Systems  Constitutional: Positive for weight loss.  Cardiovascular:  Negative  for chest pain, claudication, dyspnea on exertion, irregular heartbeat, leg swelling, near-syncope, orthopnea, palpitations, paroxysmal nocturnal dyspnea and syncope.  Respiratory:  Negative for shortness of breath.   Hematologic/Lymphatic: Negative for bleeding problem.  Musculoskeletal:  Negative for muscle cramps and myalgias.  Neurological:  Negative for dizziness and light-headedness.   PHYSICAL EXAM:    08/24/2022    9:39 AM 05/15/2022   10:10 AM 02/23/2022    3:34 PM  Vitals with BMI  Height '6\' 2"'$  '6\' 2"'$  '6\' 2"'$   Weight 245 lbs 267 lbs 14 oz 256 lbs 3 oz  BMI 31.44 29.92 42.68  Systolic 341 962 229  Diastolic 77 81 75  Pulse 96 98 93   Physical Exam  Constitutional: No distress.  Age appropriate, hemodynamically stable.   Neck: No JVD present.  Cardiovascular: Normal rate, regular rhythm, S1 normal, S2 normal, intact distal pulses and normal pulses. Exam reveals no gallop, no S3 and no S4.  No murmur heard. Pulmonary/Chest: Effort normal and breath sounds normal. No stridor. He has no wheezes. He has no rales.  Abdominal: Soft. Bowel  sounds are normal. He exhibits no distension. There is no abdominal tenderness.  Musculoskeletal:        General: No edema.     Cervical back: Neck supple.  Neurological: He is alert and oriented to person, place, and time. He has intact cranial nerves (2-12).  Skin: Skin is warm and moist.    CARDIAC DATABASE: EKG: 08/24/2022: Sinus rhythm, 94 bpm, left axis, RBBB, cannot rule out anteroseptal infarct.  Echocardiogram: Echo 03/10/2020 and [facility] MODERATE LV DYSFUNCTION EF 35%, LV normal size NORMAL RIGHT VENTRICULAR SYSTOLIC FUNCTION VALVULAR REGURGITATION: MILD AR, TRIVIAL MR, TRIVIAL PR, TRIVIAL TR NO VALVULAR STENOSIS  12/06/2020: Left ventricle cavity is normal in size. Mild concentric hypertrophy of the left ventricle. Normal global wall motion. Normal LV systolic function with EF 50-55%. Doppler evidence of grade I (impaired) diastolic dysfunction, normal LAP.  Structurally normal trileaflet aortic valve. Mild (Grade I) aortic regurgitation. Mild tricuspid regurgitation.  No evidence of pulmonary hypertension.  Stress Testing: No results found for this or any previous visit from the past 1095 days.  Heart Catheterization: None  LABORATORY DATA: External Labs: Collected: 12/29/2021 BUN 11, creatinine 1.28 mg/dL. eGFR 66. Sodium 141, potassium 4.6, chloride 103, bicarb 26. A1c 6.5  IMPRESSION:    ICD-10-CM   1. Chronic heart failure with preserved ejection fraction (HFpEF) (HCC)  I50.32 EKG 12-Lead    2. Recovered cardiomyopathy  I42.9 EKG 12-Lead    3. RBBB  I45.10     4. Mixed hyperlipidemia  E78.2     5. Schizophrenia, unspecified type ([place])  F20.9        RECOMMENDATIONS: Becket Wecker is a 57 y.o. 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  Chronic heart failure with preserved EF, NYHA class II, stage B: Echo 02/2020: LVEF 35%. Echo 11/2020: LVEF 50-55%. No recent hospitalizations or urgent care visits for CHF. Euvolemic and not in congestive heart failure. Was unable to tolerate uptitration of Entresto and addition of aldactone in the past.  Sister will call us back with the correct dose of Entresto he is taking at home.   Recovered cardiomyopathy: See above.   Hyperlipidemia, mixed: Continue statin.  Currently managed by primary care provider.  RBBB: Continue monitor.   Plan of care discussed with the patient and his sister at today's office visit.  FINAL MEDICATION LIST END OF ENCOUNTER:   Current Outpatient Medications:    albuterol (PROVENTIL) (2.5 MG/3ML) 0.083% nebulizer solution, Take 2.5 mg by nebulization every 6 (six) hours as needed for wheezing or shortness of breath., Disp: , Rfl:    albuterol (VENTOLIN HFA) 108 (90 Base) MCG/ACT inhaler, Inhale 2 puffs into the lungs every 6 (six) hours as needed for wheezing or shortness of breath., Disp: , Rfl:    carbamide peroxide (DEBROX) 6.5 % OTIC solution, 5 drops daily as needed (ear wax removal)., Disp: , Rfl:    carboxymethylcellulose (REFRESH PLUS) 0.5 % SOLN, 1 drop at bedtime. Gel, Disp: , Rfl:    cetirizine (ZYRTEC) 10 MG tablet, Take 10 mg by mouth daily., Disp: , Rfl:    cloZAPine (CLOZARIL) 100 MG tablet, Take 200 mg by mouth See admin instructions. Take with 50 mg for a total of 250 mg at bedtime, Disp: , Rfl:    clozapine (CLOZARIL) 50 MG tablet, Take 50 mg by mouth See admin instructions. Take with (2) 100 mg for a total of 250 mg at bedtime, Disp: , Rfl:    ENTRESTO 49-51 MG, Take 1 tablet by mouth 2 (two) times daily., Disp: , Rfl:    FARXIGA 10 MG TABS tablet, Take 10 mg by mouth daily., Disp: , Rfl:    fluticasone (FLONASE) 50  MCG/ACT nasal spray, Place into both nostrils., Disp: , Rfl:    fluvoxaMINE (LUVOX) 100 MG tablet, Take 300 mg by mouth at bedtime., Disp: , Rfl:    ketoconazole (NIZORAL) 2 % cream, Apply 1 application topically daily., Disp: , Rfl:    metoprolol succinate (TOPROL-XL) 50 MG 24 hr tablet, Take 1 tablet (50 mg total) by mouth in the morning. Hold if top blood pressure number less than 100 mmHg or heart rate less than 60 bpm (pulse)., Disp: 90 tablet, Rfl: 1   Multiple Vitamin (MULTIVITAMIN) capsule, Take 1 capsule by mouth daily., Disp: 90 capsule, Rfl: 3   pravastatin (PRAVACHOL) 40 MG tablet, TAKE ONE TABLET EACH DAY, Disp: 90 tablet, Rfl: 3   REFRESH OPTIVE 1-0.9 % GEL, Apply 1 drop to eye as directed., Disp: , Rfl:    sodium chloride (OCEAN) 0.65 % nasal spray, Place 1 spray into the nose as needed for congestion., Disp: , Rfl:    SODIUM FLUORIDE 5000 PLUS 1.1 % CREA dental cream, SMARTSIG:Topical, Disp: , Rfl:    timolol (BETIMOL) 0.5 % ophthalmic solution, Place 1 drop into both eyes 2 (two) times daily., Disp: , Rfl:    tiotropium (SPIRIVA) 18 MCG inhalation capsule, Place 18 mcg into inhaler and inhale daily., Disp: , Rfl:    Wheat Dextrin (BENEFIBER ON THE GO) PACK, Take 1 packet by mouth 3 (three) times daily as needed., Disp: 320 each, Rfl: 11  Orders Placed This Encounter  Procedures   EKG 12-Lead    There are no Patient Instructions on file for this visit.   --Continue cardiac medications as reconciled in final medication list. --Return in about 6 months (around 02/22/2023) for Follow up recovered cardiomyopathy. . Or sooner if needed. --Continue follow-up with your primary care physician regarding the management of your other chronic comorbid conditions.  Patient's questions and concerns were addressed to his satisfaction. He voices understanding of the instructions provided during this encounter.   This note was  created using a voice recognition software as a result there may  be grammatical errors inadvertently enclosed that do not reflect the nature of this encounter. Every attempt is made to correct such errors.  Rex Kras, [place], [facility]  Pager: [phone] Office: [phone]

## 2022-08-25 ENCOUNTER — Other Ambulatory Visit: Payer: Self-pay

## 2022-08-25 MED ORDER — ENTRESTO 24-26 MG PO TABS: 1.0000 | ORAL_TABLET | Freq: Two times a day (BID) | ORAL | 2 refills | Status: DC

## 2022-09-13 ENCOUNTER — Other Ambulatory Visit: Payer: Self-pay

## 2022-09-13 DIAGNOSIS — C069 Malignant neoplasm of mouth, unspecified: Secondary | ICD-10-CM

## 2022-09-13 NOTE — Therapy (Addendum)
[facility] [address] [place], [place], [address] Phone: [phone]   Fax:  [phone]  Patient Details  Name: [name] Branch MRN: [id number] Date of Birth: [birth year] Referring Provider:  Eppie Gibson, MD  Encounter Date: 09/13/2022  SPEECH THERAPY DISCHARGE SUMMARY  Visits from [facility] of Care: 2  Current functional level related to goals / functional outcomes: Pt was not scheduled for visits after his second visit in November 2021, when pt was independent with simplified HEP and with POs. LTGs and plan follow:    SLP Short Term Goals - 06/17/20 1646              SLP SHORT TERM GOAL #1    Title pt will complete HEP with imitation x2 sessions     Baseline 06-17-20     Time 1     Period --   sessions, for all STGs    Status On-going          SLP SHORT TERM GOAL #2    Title pt or caregiver will describe 3 overt s/s aspiration PNA with modified independence     Time 1     Status On-going                     SLP Long Term Goals - 06/17/20 1646              SLP LONG TERM GOAL #1    Title pt will complete HEP with imitation over three total visits     Baseline 06-17-20     Time 3     Period --   visits, for all LTGs    Status Revised          SLP LONG TERM GOAL #2    Title to maximize pt's pulmonary safety, caregiver will describe how to modify HEP over time, and the timeline associated with reduction in HEP frequency with modified independence over two sessions     Time 5     Status On-going                     Plan - 06/17/20 1645     Clinical Impression Statement At this time pt swallowing continues WNL/WFL with regular diet/thin liquids (ham sandwich and water tested today). SLP designed an individualized HEP for dysphagia and pt completed each exercise with indpendence. There are no overt s/s aspiration reported by pt or [name] (caregiver) at this time. Data indicate that pt's swallow ability  will likely decrease over the course of radiation therapy and could very well decline over time following conclusion of their radiation therapy due to muscle disuse atrophy and/or muscle fibrosis. Pt will cont to need to be seen by SLP in order to assess safety of PO intake, assess the need for recommending any objective swallow assessment, and ensuring pt correctly completes the individualized HEP.       Remaining deficits: Assumed deficits remain.   Education / Equipment: HEP procedure, late effects head/neck radiation on swallowing.   Patient agrees to discharge. Patient goals were not met. Patient is being discharged due to not returning since the last visit.Marland Kitchen    [place], [place] 09/13/2022, 12:07 PM  [facility] [address] [place], [place], [address] Phone: [phone]   Fax:  [phone]

## 2022-09-14 ENCOUNTER — Ambulatory Visit (INDEPENDENT_AMBULATORY_CARE_PROVIDER_SITE_OTHER): Payer: 59 | Admitting: Podiatry

## 2022-09-14 DIAGNOSIS — M79675 Pain in left toe(s): Secondary | ICD-10-CM | POA: Diagnosis not present

## 2022-09-14 DIAGNOSIS — M79674 Pain in right toe(s): Secondary | ICD-10-CM | POA: Diagnosis not present

## 2022-09-14 DIAGNOSIS — B351 Tinea unguium: Secondary | ICD-10-CM

## 2022-09-14 NOTE — Progress Notes (Signed)
  Subjective:  Patient ID: [name] Branch, male    DOB: Mar 03, 1966,  MRN: [id number]  Chief Complaint  Patient presents with   Nail Problem    est - left great toe - discolored nail / black    57 y.o. male presents with the above complaint. History confirmed with patient.  Returns today for follow-up of his thick and brown discolored toenails  Objective:  Physical Exam: warm, good capillary refill, normal DP and PT pulses, and bilateral he has multiple dystrophic thickened elongated brown discolored toenails with subungual debris  Assessment:   1. Pain due to onychomycosis of toenails of both feet       Plan:  Patient was evaluated and treated and all questions answered.  Discussed the etiology and treatment options for the condition in detail with the patient. Educated patient on the topical and oral treatment options for mycotic nails. Recommended debridement of the nails today. Sharp and mechanical debridement performed of all painful and mycotic nails today. Nails debrided in length and thickness using a nail nipper to level of comfort. Discussed treatment options including appropriate shoe gear. Follow up as needed for painful nails.    Return if symptoms worsen or fail to improve.

## 2022-10-03 ENCOUNTER — Other Ambulatory Visit: Payer: Self-pay

## 2022-10-03 ENCOUNTER — Ambulatory Visit: Payer: 59 | Attending: Radiation Oncology

## 2022-10-03 DIAGNOSIS — R1312 Dysphagia, oropharyngeal phase: Secondary | ICD-10-CM | POA: Diagnosis present

## 2022-10-03 DIAGNOSIS — C069 Malignant neoplasm of mouth, unspecified: Secondary | ICD-10-CM | POA: Diagnosis not present

## 2022-10-03 NOTE — Patient Instructions (Signed)
   Signs of Aspiration Pneumonia   Chest pain/tightness Fever (can be low grade) Cough  With foul-smelling phlegm (sputum) With sputum containing pus or blood With greenish sputum Fatigue  Shortness of breath  Wheezing   **IF YOU HAVE THESE SIGNS, CONTACT YOUR DOCTOR OR GO TO THE EMERGENCY DEPARTMENT OR URGENT CARE AS SOON AS POSSIBLE**     

## 2022-10-03 NOTE — Therapy (Incomplete)
OUTPATIENT SPEECH LANGUAGE PATHOLOGY SWALLOW EVALUATION   Patient Name: [name] Branch MRN: [id number] DOB:[birth year], 57 y.o., male Today's Date: 10/03/2022  PCP: Tobias Alexander, MD REFERRING PROVIDER: Eppie Gibson, MD  END OF SESSION:  End of Session - 10/03/22 2355     Visit Number 1    Number of Visits 3    Date for SLP Re-Evaluation 12/08/22    SLP Start Time 1452    SLP Stop Time  1530    SLP Time Calculation (min) 38 min    Activity Tolerance Patient tolerated treatment well             Past Medical History:  Diagnosis Date  . ABSCESS, ANAL/RECTAL REGIONS 08/18/2006   Annotation: Fournier gangrene Qualifier: History of  By: Garnette Scheuermann MD, Arlee Muslim    . Allergy   . Asthma   . Cancer ([place])   . Cardiomyopathy   . Cataract   . CHF (congestive heart failure) ([place])   . Colonic inertia   . Constipation   . Emphysema of lung ([place])   . GERD (gastroesophageal reflux disease)   . Glaucoma   . Hyperlipidemia   . Hypovolemic shock ([place])   . Incontinent of feces   . Mental retardation   . 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   Annotation: chronic with stercoral ulcer  Qualifier: Diagnosis of  By: Garnette Scheuermann MD, Arlee Muslim    . Obstruction of colon (H[facility])    recurrent  . Sarcoidosis   . Schizophrenia ([place])   . Sleep apnea   . 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 Total self-care deficit    per caregiver he needs to have help bathing and cleaning self and needs help with all daily needs now    Past Surgical History:  Procedure Laterality Date  . BALLOON DILATION N/A 07/26/2021   Procedure: BALLOON DILATION;  Surgeon: Mauri Pole, MD;  [facility];  Service: Endoscopy;  Laterality: N/A;  . BIOPSY  07/26/2021   Procedure: BIOPSY;  Surgeon: Mauri Pole, MD;  [facility];  Service: Endoscopy;;  . COLECTOMY N/A 09/25/2014   Procedure: OPEN ABDOMINAL COLECTOMY;  Surgeon: Michael Boston, MD;  Location[facility];  Service: General;  Laterality: N/A;  .  COLONOSCOPY  2012  . FLEXIBLE SIGMOIDOSCOPY N/A 07/26/2021   Procedure: FLEXIBLE SIGMOIDOSCOPY;  Surgeon: Mauri Pole, MD;  [facility];  Service: Endoscopy;  Laterality: N/A;  . groin surgery     boil drained  . MOUTH SURGERY    . PROCTOSCOPY N/A 09/25/2014   Procedure: RIGID PROCTOSCOPY;  Surgeon: Michael Boston, MD;  [facility]: [facility];  Service: General;  Laterality: N/A;   Patient Active Problem List   Diagnosis Date Noted  . Rectal stricture   . Encounter for colorectal cancer screening 02/09/2021  . 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 Incontinent of feces   . Total self-care deficit   . S/P total colectomy 07/20/2017  . Dizziness 10/12/2016  . Anemia of chronic disease 10/05/2014  . Colonic inertia s/p abdominal colectomy 09/25/2014 09/25/2014  . Hyperlipidemia 09/17/2008  . Glaucoma 10/10/2006  . Sarcoidosis 06/11/2006  . Schizophrenia, unspecified type ([place]) 06/11/2006  . Intellectual disability 06/11/2006  . Chronic systolic CHF (congestive heart failure) ([place]) 06/11/2006  . 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    ONSET DATE: 2021   REFERRING DIAG: Primary cancer of salivary gland  THERAPY DIAG:  Dysphagia, oropharyngeal phase  Rationale for Evaluation and  Treatment: Rehabilitation  SUBJECTIVE:   SUBJECTIVE STATEMENT: "He doesn't cough with food - pretty much just sitting there and he coughs. It's gotten worse." Pt accompanied by:  caregiver and sister [name]  PERTINENT HISTORY: Pt was seen for two visits in 2021, and was discharged due to [facility] HEP performance and WNL POs.  PAIN:  Are you having pain?  None indicated  FALLS: Has patient fallen in last 6 months?  No  LIVING ENVIRONMENT: Lives with: lives with their family Lives in: House/apartment  PLOF:  Level of assistance: Needed assistance with ADLs, Needed assistance with IADLS Employment: On disability  PATIENT GOALS:  Guardian indicates she is curious if pt is swallowing safely or not  OBJECTIVE:   DIAGNOSTIC FINDINGS: ***  RECOMMENDATIONS FROM OBJECTIVE SWALLOW STUDY (MBSS/FEES):  11/23/21 Results: Patient presents with functional oropharyngeal swallow ability- No aspiration or penetration and swallow was strong.  Objective recommended compensations:  SLP Diet Recommendations Regular solids;Thin liquid  Liquid Administration via Cup;Straw  Medication Administration Whole meds with liquid; If pills are problematic can take with puree - whole - start and follow with liquids  Compensations Slow rate;Small sips/bites;Other (Comment) Swish and expectorate with water after meals; Small sips  Postural Changes Remain semi-upright after after feeds/meals (Comment);Seated upright at 90 degrees    COGNITION: Overall cognitive status: Impaired, History of cognitive impairments - at baseline, and Difficulty to assess due to: Communication impairment Areas of impairment:  Attention: Impaired: Selective, Alternating, Divided Memory: Impaired: Working Industrial/product designer term Long term Auditory Awareness: Impaired: Intellectual Executive function: Impaired: Initiation, Problem solving, Organization, Planning, Error awareness, Self-correction, and Slow processing Behavior: flat affect Functional deficits: has 24/7 caregiver sister, [name]  ORAL MOTOR EXAMINATION: Overall status: Impaired: Labial: Bilateral (Coordination) Lingual: Bilateral (Coordination) Cough: Productive Comments: SLP unsure if pt difficulty with coordination was neurological or due to pt's reduced auditory comprehension/attention  CLINICAL SWALLOW ASSESSMENT:   Current diet: regular and thin liquids Dentition: adequate natural dentition Patient directly observed with POs: Yes: regular and thin liquids  Feeding: needs assist Liquids provided by: cup Oral phase signs and symptoms: oral holding after initial swallow; SLP cued sister to ensure pt has  empty oral cavity after POs and if not, take drink of water Pharyngeal phase signs and symptoms:  none noted    TODAY'S TREATMENT:                                                                                                                                         DATE:  SLP reviewed MBS and HEP with pt and [name] today. SLP ensured pt was completing correctly and that [name] was comfortable assisting p PATIENT EDUCATION: Education details: *** Person educated: {Person educated:25204} Education method: {Education Method:25205} Education comprehension: {Education Comprehension:25206}   ASSESSMENT:  CLINICAL IMPRESSION: Patient is a *** y.o. *** who was seen today for ***.   OBJECTIVE IMPAIRMENTS: include {SLPOBJIMP:27107}. These impairments  are limiting patient from {SLPLIMIT:27108}. Factors affecting potential to achieve goals and functional outcome are {SLP potential:25450}. Patient will benefit from skilled SLP services to address above impairments and improve overall function.  REHAB POTENTIAL: {rehabpotential:25112}   GOALS: Goals reviewed with patient? {yes/no:20286}  SHORT TERM GOALS: Target date: ***  *** Baseline: Goal status: {GOALSTATUS:25110}  2.  *** Baseline:  Goal status: {GOALSTATUS:25110}  3.  *** Baseline:  Goal status: {GOALSTATUS:25110}  4.  *** Baseline:  Goal status: {GOALSTATUS:25110}  5.  *** Baseline:  Goal status: {GOALSTATUS:25110}  6.  *** Baseline:  Goal status: {GOALSTATUS:25110}  LONG TERM GOALS: Target date: ***  *** Baseline:  Goal status: {GOALSTATUS:25110}  2.  *** Baseline:  Goal status: {GOALSTATUS:25110}  3.  *** Baseline:  Goal status: {GOALSTATUS:25110}  4.  *** Baseline:  Goal status: {GOALSTATUS:25110}  5.  *** Baseline:  Goal status: {GOALSTATUS:25110}  6.  *** Baseline:  Goal status: {GOALSTATUS:25110}  PLAN:  SLP FREQUENCY: {rehab frequency:25116}  SLP DURATION: {rehab  duration:25117}  PLANNED INTERVENTIONS: {SLP treatment/interventions:25449}    Jeanne Diefendorf, CCC-SLP 10/03/2022, 11:56 PM

## 2022-10-03 NOTE — Therapy (Unsigned)
OUTPATIENT SPEECH LANGUAGE PATHOLOGY SWALLOW EVALUATION   Patient Name: [name] Branch MRN: [id number] DOB:[birth year], 57 y.o., male Today's Date: 10/03/2022  PCP: [name] Alexander, MD REFERRING PROVIDER: Eppie Gibson, MD  END OF SESSION:  End of Session - 10/03/22 2355     Visit Number 1    Number of Visits 3    Date for SLP Re-Evaluation 12/08/22    SLP Start Time 1452    SLP Stop Time  45    SLP Time Calculation (min) 38 min    Activity Tolerance Patient tolerated treatment well             Past Medical History:  Diagnosis Date   ABSCESS, ANAL/RECTAL REGIONS 08/18/2006   Annotation: Fournier gangrene Qualifier: History of  By: Garnette Scheuermann MD, [name]     Allergy    Asthma    Cancer [facility])    Cardiomyopathy    Cataract    CHF (congestive heart failure) ([place])    Colonic inertia    Constipation    Emphysema of lung ([place])    GERD (gastroesophageal reflux disease)    Glaucoma    Hyperlipidemia    Hypovolemic shock ([place])    Incontinent of feces    Mental retardation    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   Annotation: chronic with stercoral ulcer  Qualifier: Diagnosis of  By: Garnette Scheuermann MD, [name]     Obstruction of colon ([place])    recurrent   Sarcoidosis    Schizophrenia ([place])    Sleep apnea    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   Total self-care deficit    per caregiver he needs to have help bathing and cleaning self and needs help with all daily needs now    Past Surgical History:  Procedure Laterality Date   BALLOON DILATION N/A 07/26/2021   Procedure: BALLOON DILATION;  Surgeon: [name] Pole, MD;  [facility];  Service: Endoscopy;  Laterality: N/A;   BIOPSY  07/26/2021   Procedure: BIOPSY;  Surgeon: [name] Pole, MD;  [facility];  Service: Endoscopy;;   COLECTOMY N/A 09/25/2014   Procedure: OPEN ABDOMINAL COLECTOMY;  Surgeon: [name] Boston, MD;  [facility];  Service: General;  Laterality: N/A;   COLONOSCOPY  2012   FLEXIBLE  SIGMOIDOSCOPY N/A 07/26/2021   Procedure: FLEXIBLE SIGMOIDOSCOPY;  Surgeon: [name] Pole, MD;  [facility];  Service: Endoscopy;  Laterality: N/A;   groin surgery     boil drained   MOUTH SURGERY     PROCTOSCOPY N/A 09/25/2014   Procedure: RIGID PROCTOSCOPY;  Surgeon: [name] Boston, MD;  [facility];  Service: General;  Laterality: N/A;   Patient Active Problem List   Diagnosis Date Noted   Rectal stricture    Encounter for colorectal cancer screening 02/09/2021   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   Incontinent of feces    Total self-care deficit    S/P total colectomy 07/20/2017   Dizziness 10/12/2016   Anemia of chronic disease 10/05/2014   Colonic inertia s/p abdominal colectomy 09/25/2014 09/25/2014   Hyperlipidemia 09/17/2008   Glaucoma 10/10/2006   Sarcoidosis 06/11/2006   Schizophrenia, unspecified type ([place]) 06/11/2006   Intellectual disability [id number]   Chronic systolic CHF (congestive heart failure) ([place]) 06/11/2006   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    ONSET DATE: 2021   REFERRING DIAG: Primary cancer of salivary gland  THERAPY DIAG:  Dysphagia, oropharyngeal phase  Rationale for Evaluation and  Treatment: Rehabilitation  SUBJECTIVE:   SUBJECTIVE STATEMENT: "He doesn't cough with food - pretty much just sitting there and he coughs. It's gotten worse." Pt accompanied by:  caregiver and sister [name] Branch  PERTINENT HISTORY: Pt was seen for two visits in 2021, and was discharged due to [facility] HEP performance and WNL POs.  PAIN:  Are you having pain?  None indicated  FALLS: Has patient fallen in last 6 months?  No  LIVING ENVIRONMENT: Lives with: lives with their family Lives in: House/apartment  PLOF:  Level of assistance: Needed assistance with ADLs, Needed assistance with IADLS Employment: On disability  PATIENT GOALS: Guardian indicates she is curious if pt is swallowing  safely or not  OBJECTIVE:   RECOMMENDATIONS FROM OBJECTIVE SWALLOW STUDY (MBSS/FEES):  11/23/21 Results: Patient presents with functional oropharyngeal swallow ability- No aspiration or penetration and swallow was strong.  Objective recommended compensations:  SLP Diet Recommendations Regular solids;Thin liquid  Liquid Administration via Cup;Straw  Medication Administration Whole meds with liquid; If pills are problematic can take with puree - whole - start and follow with liquids  Compensations Slow rate;Small sips/bites;Swish and expectorate with water after meals; Small sips  Postural Changes Remain semi-upright after after feeds/meals;Seated upright at 90 degrees    COGNITION: Overall cognitive status: Impaired, History of cognitive impairments - at baseline, and Difficulty to assess due to: Communication impairment Areas of impairment:  Attention: Impaired: Selective, Alternating, Divided Memory: Impaired: Working Industrial/product designer term Long term Auditory Awareness: Impaired: Intellectual Executive function: Impaired: Initiation, Problem solving, Organization, Planning, Error awareness, Self-correction, and Slow processing Behavior: flat affect Functional deficits: has 24/7 caregiver sister, [name] Branch  ORAL MOTOR EXAMINATION: Overall status: Impaired: Labial: Bilateral (Coordination) Lingual: Bilateral (Coordination) Cough: Productive Comments: SLP unsure if pt difficulty with coordination was neurological or due to pt's reduced auditory comprehension/attention  CLINICAL SWALLOW ASSESSMENT:   Current diet: regular and thin liquids Dentition: adequate natural dentition Patient directly observed with POs: Yes: regular and thin liquids  Feeding: needs assist Liquids provided by: cup Oral phase signs and symptoms: oral holding after initial swallow; SLP cued sister to ensure pt has empty oral cavity after POs and if not, take drink of water Pharyngeal phase signs and symptoms:  none  noted    TODAY'S TREATMENT:                                                                                                                                         DATE:  SLP reviewed MBS, precautions, and HEP with pt and [name] Branch today. SLP ensured pt was completing correctly and that [name] Branch was comfortable assisting pt - she indicated she was. Pt followed precautions with initial min-mod A faded to occasional min cues.  Pt noted with oral residue after initial two swallows (spontaneous). SLP told pt sister to have pt have water after pt is finished with POs due to likely portion of bolus  still present (oral holding).  SLP also educated re: overt s/sx aspiration PNA  PATIENT EDUCATION: Education details: see "today's treatment" Person educated: Patient and Caregiver [name] Branch Education method: Explanation, Demonstration, Verbal cues, and Handouts Education comprehension: verbalized understanding, returned demonstration, verbal cues required, and needs further education   ASSESSMENT:  CLINICAL IMPRESSION: Patient is a 57 y.o. male who was seen today for assessment of swallowing. SLP provided some education based on BSE and results from [facility] from May 2023. Pt should return in approx 4 weeks to track progress with HEP and ensure safety with POs.   OBJECTIVE IMPAIRMENTS: include attention, memory, awareness, executive functioning, expressive language, receptive language, dysarthria, and dysphagia. However dysphagia will be treated at this clinic for this plan of care. These impairments are limiting patient from safety when swallowing. Factors affecting potential to achieve goals and functional outcome are ability to learn/carryover information, cooperation/participation level, and previous level of function. Patient will benefit from skilled SLP services to address above impairments and improve overall function.  REHAB POTENTIAL: Good   GOALS: Goals reviewed with patient? No    LONG TERM  GOALS: Target date: 12/08/22  Pt caregiver will describe comfort in assisting pt with HEP Baseline:  Goal status: INITIAL  2.  Pt caregiver will describe comfort in assisting pt with precautions for POs Baseline:  Goal status: INITIAL  3.  Caregiver will tell SLP 3 overt s/sx aspiration PNA to promote pt pulmonary health Baseline:  Goal status: INITIAL   PLAN:  SLP FREQUENCY: 1x/month  SLP DURATION:  2 sessions; possible d/c after one follow up  PLANNED INTERVENTIONS: Aspiration precaution training, Pharyngeal strengthening exercises, Diet toleration management , Environmental controls, Internal/external aids, SLP instruction and feedback, Compensatory strategies, and Patient/family education    [facility], [place] 10/03/2022, 11:56 PM

## 2022-11-06 ENCOUNTER — Ambulatory Visit: Payer: 59

## 2022-12-14 ENCOUNTER — Ambulatory Visit (INDEPENDENT_AMBULATORY_CARE_PROVIDER_SITE_OTHER): Payer: 59 | Admitting: Podiatry

## 2022-12-14 DIAGNOSIS — M79674 Pain in right toe(s): Secondary | ICD-10-CM

## 2022-12-14 DIAGNOSIS — B351 Tinea unguium: Secondary | ICD-10-CM

## 2022-12-14 DIAGNOSIS — M79675 Pain in left toe(s): Secondary | ICD-10-CM

## 2022-12-18 NOTE — Progress Notes (Signed)
  Subjective:  Patient ID: [name] Branch, male    DOB: 05/20/1966,  MRN: [id number]  Chief Complaint  Patient presents with   Nail Problem    Thick painful toenails, 3 month follow up    57 y.o. male presents with the above complaint. History confirmed with patient.  Returns today for follow-up of his thick and brown discolored toenails  Objective:  Physical Exam: warm, good capillary refill, normal DP and PT pulses, and bilateral he has multiple dystrophic thickened elongated brown discolored toenails with subungual debris  Assessment:   1. Pain due to onychomycosis of toenails of both feet       Plan:  Patient was evaluated and treated and all questions answered.  Discussed the etiology and treatment options for the condition in detail with the patient. E prior regular debridements have been helpful for him. Recommended debridement of the nails today. Sharp and mechanical debridement performed of all painful and mycotic nails today. Nails debrided in length and thickness using a nail nipper to level of comfort. Discussed treatment options including appropriate shoe gear. Follow up as needed for painful nails.    Return in about 3 months (around 03/16/2023) for painful thick fungal nails.

## 2023-02-21 ENCOUNTER — Encounter: Payer: Self-pay | Admitting: Cardiology

## 2023-02-21 ENCOUNTER — Ambulatory Visit: Payer: 59 | Admitting: Cardiology

## 2023-02-21 VITALS — BP 115/67 | HR 78 | Resp 16 | Ht 74.0 in | Wt 240.6 lb

## 2023-02-21 DIAGNOSIS — I4510 Unspecified right bundle-branch block: Secondary | ICD-10-CM

## 2023-02-21 DIAGNOSIS — F209 Schizophrenia, unspecified: Secondary | ICD-10-CM

## 2023-02-21 DIAGNOSIS — I5032 Chronic diastolic (congestive) heart failure: Secondary | ICD-10-CM

## 2023-02-21 DIAGNOSIS — E782 Mixed hyperlipidemia: Secondary | ICD-10-CM

## 2023-02-21 NOTE — Addendum Note (Signed)
Addended by: Johnette Abraham on: 02/21/2023 12:47 PM   Modules accepted: Orders

## 2023-02-21 NOTE — Progress Notes (Signed)
ID:  [name] Branch, DOB 1965/08/24, MRN [id number]  PCP:  Loura Back, NP  Cardiologist: Tessa Lerner, DO, [facility] (established care 07/12/2020) Former Cardiology Providers: Dr. Youlanda Mighty and Hart Robinsons PA.  Date: 02/21/23 Last Office Visit: 08/24/2022  Chief Complaint  Patient presents with   Follow-up    Heart failure with improved EF/HFpEF    HPI  [name] Branch is a 57 y.o. 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  Given his intellectual disability and schizophrenia his is accompanied by his guardian [name] (sister / guardian) [phone].    He was formally under the care of Dr. Lupita Shutter at [facility] and referred to our practice in December 2021 for management of cardiomyopathy presumed to be nonischemic and chronic combined systolic and diastolic heart failure.  Based on EMR patient is noted to have cardiomyopathy since 2016 at the time of colon surgery.  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 Patient's sister states that he subsequently underwent surgery and completed radiation treatment as of 06/16/2020. Since establishing care with myself his GDMT has been uptitrated and he underwent a repeat echocardiogram which notes preserved LVEF of 50-55% and grade 1 diastolic impairment.    Patient presents today for 13-month follow-up visit.  Since last office visit, denies anginal chest pain or heart failure symptoms.  Sister states that he was started on [place].  Unable to update MAR as they do not have a medication list or bottles with them.  He is lost 5 pounds since last  office visit.  He denies any hospitalizations or urgent care visits for cardiovascular reasons since last office visit  FUNCTIONAL STATUS: Able to ambulate five day a week.   ALLERGIES: Allergies  Allergen Reactions   Hydrocodone Other (See Comments)    Exacerbation of urinary and fecal incontinence    MEDICATION LIST PRIOR TO VISIT: Current Meds  Medication Sig   albuterol (PROVENTIL) (2.5 MG/3ML) 0.083% nebulizer solution Take 2.5 mg by nebulization every 6 (six) hours as needed for wheezing or shortness of breath.   albuterol (VENTOLIN HFA) 108 (90 Base) MCG/ACT inhaler Inhale 2 puffs into the lungs every 6 (six) hours as needed for wheezing or shortness of breath.   carbamide peroxide (DEBROX) 6.5 % OTIC solution 5 drops daily as needed (ear wax removal).   carboxymethylcellulose (REFRESH PLUS) 0.5 % SOLN 1 drop at bedtime. Gel   cetirizine (ZYRTEC) 10 MG tablet Take 10 mg by mouth daily.   cloZAPine (CLOZARIL) 100 MG tablet Take 200 mg by mouth See admin instructions. Take with 50 mg for a total of 250 mg at bedtime   clozapine (CLOZARIL) 50 MG tablet Take 50 mg by mouth See admin instructions. Take with (2) 100 mg for a total of 250 mg at bedtime   fluticasone (FLONASE) 50 MCG/ACT nasal spray Place into both nostrils.   fluvoxaMINE (LUVOX) 100 MG tablet Take 300 mg by mouth at bedtime.   ketoconazole (NIZORAL) 2 % cream Apply 1 application topically daily.   metoprolol succinate (TOPROL-XL) 50 MG 24 hr tablet Take 1 tablet (50 mg total) by mouth in the morning. Hold if top blood pressure number less than 100 mmHg or heart rate less  than 60 bpm (pulse).   Multiple Vitamin (MULTIVITAMIN) capsule Take 1 capsule by mouth daily.   pravastatin (PRAVACHOL) 40 MG tablet TAKE ONE TABLET EACH DAY   REFRESH OPTIVE 1-0.9 % GEL Apply 1 drop to eye as directed.   sacubitril-valsartan (ENTRESTO) 24-26 MG Take 1 tablet by mouth 2 (two) times daily.   sodium chloride (OCEAN) 0.65 % nasal  spray Place 1 spray into the nose as needed for congestion.   SODIUM FLUORIDE 5000 PLUS 1.1 % CREA dental cream SMARTSIG:Topical   timolol (BETIMOL) 0.5 % ophthalmic solution Place 1 drop into both eyes 2 (two) times daily.   tiotropium (SPIRIVA) 18 MCG inhalation capsule Place 18 mcg into inhaler and inhale daily.   Wheat Dextrin (BENEFIBER ON THE GO) PACK Take 1 packet by mouth 3 (three) times daily as needed.     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  Qualifier: Diagnosis of  By: Silvestre Mesi MD, Yogesh     Obstruction of colon (HCC)    recurrent   Sarcoidosis    Schizophrenia (HCC)    Sleep apnea    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   Total self-care deficit    per caregiver he needs to have help bathing and cleaning self and needs help with all daily needs now     PAST SURGICAL HISTORY: Past Surgical History:  Procedure Laterality Date   BALLOON DILATION N/A 07/26/2021   Procedure: BALLOON DILATION;  Surgeon: Napoleon Form, MD;  Location: [facility];  Service: Endoscopy;  Laterality: N/A;   BIOPSY  07/26/2021   Procedure: BIOPSY;  Surgeon: Napoleon Form, MD;  Location: [facility];  Service: Endoscopy;;   COLECTOMY N/A 09/25/2014   Procedure: OPEN ABDOMINAL COLECTOMY;  Surgeon: Karie Soda, MD;  Location: [facility];  Service: General;  Laterality: N/A;   COLONOSCOPY  2012   FLEXIBLE SIGMOIDOSCOPY N/A 07/26/2021   Procedure: FLEXIBLE  SIGMOIDOSCOPY;  Surgeon: Napoleon Form, MD;  Location: [facility];  Service: Endoscopy;  Laterality: N/A;   groin surgery     boil drained   MOUTH SURGERY     PROCTOSCOPY N/A 09/25/2014   Procedure: RIGID PROCTOSCOPY;  Surgeon: Karie Soda, MD;  Location: [facility];  Service: General;  Laterality: N/A;    FAMILY HISTORY: The patient family history includes Asthma in his mother; Diabetes in his mother and sister; Heart attack in his brother; Heart failure in his mother; Hypertension in his mother, sister, and sister; Stroke in his mother.  SOCIAL HISTORY:  The patient  reports that he has never smoked. He has never used smokeless tobacco. He reports that he does not drink alcohol and does not use drugs.  REVIEW OF SYSTEMS: Review of Systems  Constitutional: Positive for weight loss.  Cardiovascular:  Negative for chest pain, claudication, dyspnea on exertion, irregular heartbeat, leg swelling,  near-syncope, orthopnea, palpitations, paroxysmal nocturnal dyspnea and syncope.  Respiratory:  Negative for shortness of breath.   Hematologic/Lymphatic: Negative for bleeding problem.  Musculoskeletal:  Negative for muscle cramps and myalgias.  Neurological:  Negative for dizziness and light-headedness.   PHYSICAL EXAM:    02/21/2023    9:47 AM 02/21/2023    9:35 AM 08/24/2022    9:39 AM  Vitals with BMI  Height  6\' 2"  6\' 2"   Weight  240 lbs 10 oz 245 lbs  BMI  30.88 31.44  Systolic 115 98 134  Diastolic 67 60 77  Pulse  78 96   Physical Exam  Constitutional: No distress.  Age appropriate, hemodynamically stable.   Neck: No JVD present.  Cardiovascular: Normal rate, regular rhythm, S1 normal, S2 normal, intact distal pulses and normal pulses. Exam reveals no gallop, no S3 and no S4.  No murmur heard. Pulmonary/Chest: Effort normal and breath sounds normal. No stridor. He has no wheezes. He has no rales.  Abdominal: Soft. Bowel sounds are normal. He exhibits no distension. There  is no abdominal tenderness.  Musculoskeletal:        General: No edema.     Cervical back: Neck supple.  Neurological: He is alert and oriented to person, place, and time. He has intact cranial nerves (2-12).  Skin: Skin is warm and moist.    CARDIAC DATABASE: EKG: 08/24/2022: Sinus rhythm, 94 bpm, left axis, RBBB, cannot rule out anteroseptal infarct.  Echocardiogram: Echo 03/10/2020 and [facility] MODERATE LV DYSFUNCTION EF 35%, LV normal size NORMAL RIGHT VENTRICULAR SYSTOLIC FUNCTION VALVULAR REGURGITATION: MILD AR, TRIVIAL MR, TRIVIAL PR, TRIVIAL TR NO VALVULAR STENOSIS  12/06/2020: Left ventricle cavity is normal in size. Mild concentric hypertrophy of the left ventricle. Normal global wall motion. Normal LV systolic function with EF 50-55%. Doppler evidence of grade I (impaired) diastolic dysfunction, normal LAP.  Structurally normal trileaflet aortic valve. Mild (Grade I) aortic regurgitation. Mild tricuspid regurgitation.  No evidence of pulmonary hypertension.  Stress Testing: No results found for this or any previous visit from the past 1095 days.  Heart Catheterization: None  LABORATORY DATA: External Labs: Collected: 12/29/2021 BUN 11, creatinine 1.28 mg/dL. eGFR 66. Sodium 141, potassium 4.6, chloride 103, bicarb 26. A1c 6.5  IMPRESSION:    ICD-10-CM   1. Chronic heart failure with preserved ejection fraction (HFpEF) (HCC)  I50.32     2. Heart failure with improved ejection fraction (HFimpEF) (HCC)  I50.32     3. RBBB  I45.10     4. Mixed hyperlipidemia  E78.2     5. Schizophrenia, unspecified type (HCC)  F20.9         RECOMMENDATIONS: Taite Schoeppner is a 57 y.o. 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  Chronic heart failure with preserved EF, NYHA class II, stage B: Heart failure with improved EF Echo 02/2020: LVEF 35%. Echo 11/2020: LVEF 50-55%. No recent hospitalizations or urgent care visits for CHF. Euvolemic and not in congestive heart failure. Has lost an additional 5 pounds since last office visit. Unable to update his medication -they did not bring a medication list or bottles with them.  Sister will come back and provide this. I suspect that his GDMT will need to be down titrated as he loses  weight to prevent episodes of hypotension or AKI. In the past had to reduce the dose of Entresto and discontinue spironolactone due to inability to tolerate it and also soft blood pressures  Hyperlipidemia, mixed: Continue statin.  Currently managed by primary care provider.  RBBB: Continue monitor.   Plan of care discussed with his legal guardian and Sister Lawson Fiscal.  FINAL MEDICATION LIST END OF ENCOUNTER:   Current Outpatient Medications:    albuterol (PROVENTIL) (2.5 MG/3ML) 0.083% nebulizer solution, Take 2.5 mg by nebulization every 6 (six) hours as needed for wheezing or shortness of breath., Disp: , Rfl:    albuterol (VENTOLIN HFA) 108 (90 Base) MCG/ACT inhaler, Inhale 2 puffs into the lungs every 6 (six) hours as needed for wheezing or shortness of breath., Disp: , Rfl:    carbamide peroxide (DEBROX) 6.5 % OTIC solution, 5 drops daily as needed (ear wax removal)., Disp: , Rfl:    carboxymethylcellulose (REFRESH PLUS) 0.5 % SOLN, 1 drop at bedtime. Gel, Disp: , Rfl:    cetirizine (ZYRTEC) 10 MG tablet, Take 10 mg by mouth daily., Disp: , Rfl:    cloZAPine (CLOZARIL) 100 MG tablet, Take 200 mg by mouth See admin instructions. Take with 50 mg for a total of 250 mg at bedtime, Disp: , Rfl:    clozapine (CLOZARIL) 50 MG tablet, Take 50 mg by mouth See admin instructions. Take with (2) 100 mg for a  total of 250 mg at bedtime, Disp: , Rfl:    fluticasone (FLONASE) 50 MCG/ACT nasal spray, Place into both nostrils., Disp: , Rfl:    fluvoxaMINE (LUVOX) 100 MG tablet, Take 300 mg by mouth at bedtime., Disp: , Rfl:    ketoconazole (NIZORAL) 2 % cream, Apply 1 application topically daily., Disp: , Rfl:    metoprolol succinate (TOPROL-XL) 50 MG 24 hr tablet, Take 1 tablet (50 mg total) by mouth in the morning. Hold if top blood pressure number less than 100 mmHg or heart rate less than 60 bpm (pulse)., Disp: 90 tablet, Rfl: 1   Multiple Vitamin (MULTIVITAMIN) capsule, Take 1 capsule by mouth daily., Disp: 90 capsule, Rfl: 3   pravastatin (PRAVACHOL) 40 MG tablet, TAKE ONE TABLET EACH DAY, Disp: 90 tablet, Rfl: 3   REFRESH OPTIVE 1-0.9 % GEL, Apply 1 drop to eye as directed., Disp: , Rfl:    sacubitril-valsartan (ENTRESTO) 24-26 MG, Take 1 tablet by mouth 2 (two) times daily., Disp: 60 tablet, Rfl: 2   sodium chloride (OCEAN) 0.65 % nasal spray, Place 1 spray into the nose as needed for congestion., Disp: , Rfl:    SODIUM FLUORIDE 5000 PLUS 1.1 % CREA dental cream, SMARTSIG:Topical, Disp: , Rfl:    timolol (BETIMOL) 0.5 % ophthalmic solution, Place 1 drop into both eyes 2 (two) times daily., Disp: , Rfl:    tiotropium (SPIRIVA) 18 MCG inhalation capsule, Place 18 mcg into inhaler and inhale daily., Disp: , Rfl:    Wheat Dextrin (BENEFIBER ON THE GO) PACK, Take 1 packet by mouth 3 (three) times daily as needed., Disp: 320 each, Rfl: 11  No orders of the defined types were placed in this encounter.   There are no Patient Instructions on file for this visit.   --Continue cardiac medications as reconciled in final medication list. --Return in about 6 months (around 08/24/2023) for Follow up HFimpEF. Or sooner if needed. --Continue follow-up with your primary care physician regarding the management of your other chronic comorbid conditions.  Patient's questions and concerns were addressed  to his  satisfaction. He voices understanding of the instructions provided during this encounter.   This note was created using a voice recognition software as a result there may be grammatical errors inadvertently enclosed that do not reflect the nature of this encounter. Every attempt is made to correct such errors.  Tessa Lerner, [place], [facility]  Pager:  [phone] Office: [phone]

## 2023-02-22 ENCOUNTER — Ambulatory Visit: Payer: 59 | Admitting: Cardiology

## 2023-03-06 ENCOUNTER — Ambulatory Visit
Admission: RE | Admit: 2023-03-06 | Discharge: 2023-03-06 | Disposition: A | Discharge status: Home / Self Care | Payer: 59 | Source: Ambulatory Visit | Attending: Registered Nurse | Admitting: Registered Nurse

## 2023-03-06 ENCOUNTER — Other Ambulatory Visit: Payer: Self-pay | Admitting: Registered Nurse

## 2023-03-06 DIAGNOSIS — R000 Tachycardia, unspecified: Secondary | ICD-10-CM

## 2023-03-14 ENCOUNTER — Telehealth: Payer: Self-pay

## 2023-03-14 NOTE — Telephone Encounter (Signed)
Done

## 2023-03-14 NOTE — Telephone Encounter (Signed)
Pt unable to reach pt legal guardian after message was left for nursing staff to call her. Rn left a detailed message with call back information for [name] Branch. Rn will await a call back.

## 2023-03-14 NOTE — Telephone Encounter (Signed)
RN was able to reach pt legal guardian after several phone calls. [name] reported to RN that the pt will need dental clearance for teeth extraction. Per Laveda Abbe he has an appointment on 03-30-23 to see the dentist and the office would like dental clearance before then if possible. Rn called to pt dental office [facility] [phone]) to have them send the dental clearance form to Rn to get to Dr. Basilio Cairo.

## 2023-03-15 ENCOUNTER — Encounter: Payer: Self-pay | Admitting: Podiatry

## 2023-03-15 ENCOUNTER — Ambulatory Visit (INDEPENDENT_AMBULATORY_CARE_PROVIDER_SITE_OTHER): Payer: 59 | Admitting: Podiatry

## 2023-03-15 DIAGNOSIS — M79675 Pain in left toe(s): Secondary | ICD-10-CM | POA: Diagnosis not present

## 2023-03-15 DIAGNOSIS — M79674 Pain in right toe(s): Secondary | ICD-10-CM | POA: Diagnosis not present

## 2023-03-15 DIAGNOSIS — B351 Tinea unguium: Secondary | ICD-10-CM

## 2023-03-15 NOTE — Progress Notes (Signed)
  Subjective:  Patient ID: [name] Branch, male    DOB: 1965-12-27,  MRN: [id number]  Chief Complaint  Patient presents with   Diabetes    "Cut his toenails.  I tried to cut them a little bit.  The calluses have come back."    57 y.o. male presents with the above complaint. History confirmed with patient.  Returns today for follow-up of his thick and brown discolored toenails, prior debridements have been helpful in reducing pain and improving function  Objective:  Physical Exam: warm, good capillary refill, normal DP and PT pulses, and bilateral he has multiple dystrophic thickened elongated brown discolored toenails with subungual debris  Assessment:   1. Pain due to onychomycosis of toenails of both feet       Plan:  Patient was evaluated and treated and all questions answered.  Discussed the etiology and treatment options for the condition in detail with the patient. E prior regular debridements have been helpful for him. Recommended debridement of the nails today. Sharp and mechanical debridement performed of all painful and mycotic nails today. Nails debrided in length and thickness using a nail nipper to level of comfort. Discussed treatment options including appropriate shoe gear. Follow up as needed for painful nails.    Return in about 10 weeks (around 05/24/2023) for painful thick fungal nails.

## 2023-03-21 ENCOUNTER — Telehealth: Payer: Self-pay

## 2023-03-21 NOTE — Telephone Encounter (Signed)
Rn called dental office on behalf of pt (family had called earlier last week due to need for dental clearance for teeth extraction). Rn had called office last week and dental clearance form was not sent. Tam[facility] from [facility] sent form to RN. RN will have Dr. Basilio Cairo sign form and send to dental office when complete.

## 2023-03-30 ENCOUNTER — Telehealth: Payer: Self-pay

## 2023-03-30 NOTE — Telephone Encounter (Signed)
Rn called [name] (pt caregiver) to inform her we were working on dental clearance for the pt. [name] stated they were just getting xrays on the pt today and planned on bringing pt back to the office for a cleaning at another time. Rn will continue to monitor this need and support as needed.

## 2023-05-15 ENCOUNTER — Other Ambulatory Visit: Payer: Self-pay

## 2023-05-15 DIAGNOSIS — C050 Malignant neoplasm of hard palate: Secondary | ICD-10-CM

## 2023-05-15 DIAGNOSIS — C069 Malignant neoplasm of mouth, unspecified: Secondary | ICD-10-CM

## 2023-05-15 NOTE — Progress Notes (Deleted)
CLINIC:  Survivorship  REASON FOR VISIT:  Routine follow-up for history of head & neck cancer.  BRIEF ONCOLOGIC HISTORY:  Oncology History  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HCC)  04/28/2020 Cancer Staging   Staging form: Oral Cavity, AJCC 8th Edition - Pathologic stage from 04/28/2020: Stage Unknown (pT2, pNX, cM0) - Signed by Lonie Peak, MD on 05/03/2020   05/03/2020 Initial Diagnosis   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HCC)      INTERVAL HISTORY:  ***  -Pain:  -Nutrition/Diet:  -Dysphagia?:  -Dental issues?: using fluoride trays?  -Last TSH:  -Weight: (LOSS/GAIN) since (last visit)   -Last ENT visit:   -Last Rad Onc visit:  -Last Dentist visit:     ADDITIONAL REVIEW OF SYSTEMS:  ROS    CURRENT MEDICATIONS:  Current Outpatient Medications on File Prior to Visit  Medication Sig Dispense Refill   albuterol (PROVENTIL) (2.5 MG/3ML) 0.083% nebulizer solution Take 2.5 mg by nebulization every 6 (six) hours as needed for wheezing or shortness of breath.     albuterol (VENTOLIN HFA) 108 (90 Base) MCG/ACT inhaler Inhale 2 puffs into the lungs every 6 (six) hours as needed for wheezing or shortness of breath.     carbamide peroxide (DEBROX) 6.5 % OTIC solution 5 drops daily as needed (ear wax removal).     carboxymethylcellulose (REFRESH PLUS) 0.5 % SOLN 1 drop at bedtime. Gel     cetirizine (ZYRTEC) 10 MG tablet Take 10 mg by mouth daily.     cloZAPine (CLOZARIL) 100 MG tablet Take 200 mg by mouth See admin instructions. Take with 50 mg for a total of 250 mg at bedtime     clozapine (CLOZARIL) 50 MG tablet Take 50 mg by mouth See admin instructions. Take with (2) 100 mg for a total of 250 mg at bedtime     dapagliflozin propanediol (FARXIGA) 10 MG TABS tablet Take 10 mg by mouth daily.     fluticasone (FLONASE) 50 MCG/ACT nasal spray Place into both nostrils.     fluvoxaMINE (LUVOX) 100 MG tablet Take 300 mg by mouth at bedtime.     ketoconazole (NIZORAL) 2 % cream Apply 1 application  topically daily.     metoprolol succinate (TOPROL-XL) 50 MG 24 hr tablet Take 1 tablet (50 mg total) by mouth in the morning. Hold if top blood pressure number less than 100 mmHg or heart rate less than 60 bpm (pulse). 90 tablet 1   MOUNJARO 5 MG/0.5ML Pen Inject 5 mg into the skin once a week.     Multiple Vitamin (MULTIVITAMIN) capsule Take 1 capsule by mouth daily. 90 capsule 3   pravastatin (PRAVACHOL) 40 MG tablet TAKE ONE TABLET EACH DAY 90 tablet 3   REFRESH OPTIVE 1-0.9 % GEL Apply 1 drop to eye as directed.     sacubitril-valsartan (ENTRESTO) 24-26 MG Take 1 tablet by mouth 2 (two) times daily. 60 tablet 2   sodium chloride (OCEAN) 0.65 % nasal spray Place 1 spray into the nose as needed for congestion.     SODIUM FLUORIDE 5000 PLUS 1.1 % CREA dental cream SMARTSIG:Topical     timolol (BETIMOL) 0.5 % ophthalmic solution Place 1 drop into both eyes 2 (two) times daily.     tiotropium (SPIRIVA) 18 MCG inhalation capsule Place 18 mcg into inhaler and inhale daily.     Wheat Dextrin (BENEFIBER ON THE GO) PACK Take 1 packet by mouth 3 (three) times daily as needed. 320 each 11   [DISCONTINUED] spironolactone (ALDACTONE)  25 MG tablet Take 0.5 tablets (12.5 mg total) by mouth in the morning. 45 tablet 0   No current facility-administered medications on file prior to visit.    ALLERGIES:  Allergies  Allergen Reactions   Hydrocodone Other (See Comments)    Exacerbation of urinary and fecal incontinence     PHYSICAL EXAM:  There were no vitals filed for this visit. There were no vitals filed for this visit.  Weight Date                     Pre-treatment (RT consult date):    General: Well-nourished, well-appearing male/male*** in no acute distress.  Accompanied/Unaccompanied today.  HEENT: Head is atraumatic and normocephalic.  Pupils equal and reactive to light. Conjunctivae clear without exudate.  Sclerae anicteric. Oral mucosa is pink and moist without lesions.  Tongue  pink, moist, and midline. Oropharynx is pink and moist, without lesions. Lymph: No preauricular, postauricular, cervical, supraclavicular, or infraclavicular lymphadenopathy noted on palpation.   Neck: No palpable masses. Skin on neck is ***.  Cardiovascular: Normal rate and rhythm. Respiratory: Clear to auscultation bilaterally. Chest expansion symmetric without accessory muscle use; breathing non-labored.  GI: Abdomen soft and round. Non-tender, non-distended. Bowel sounds normoactive.  GU: Deferred.   Neuro: No focal deficits. Steady gait.   Psych: Normal mood and affect for situation. Extremities: No edema.  Skin: Warm and dry.    LABORATORY DATA:  None at this visit.***  DIAGNOSTIC IMAGING:  None at this visit.    ASSESSMENT & PLAN:  Mr. Colvin is a pleasant 57 y.o. male/male*** with history of ***, diagnosed in ***(date);  treated with ***; completed treatment on (date).  Patient presents to survivorship clinic today for routine follow-up after finishing treatment.   1. Cancer***:  Mr. Burkett is clinically without evidence of disease or recurrence on physical exam today.     #. Nutritional status: Mr. Wittmeyer reports that he is currently able to consume adequate nutrition by mouth/via tube***.  Weight is stable at *** lbs today.  Encouraged to continue to consume adequate hydration and nutrition, as tolerated.    #. At risk for dysphagia: Given Mr. Tomb treatment for *** cancer, which included surgery and radiation therapy***, he is at risk for chronic dysphagia.  he reports having difficulty with breads and meats, but is able to consume soft foods and liquids without difficulty.  I encouraged him to continue to perform the swallowing exercises, as directed by Verdie Mosher, SLP.  If Mr. Feinstein requires further swallowing or speech therapy evaluation, I will be happy to place that referral, if needed.  Currently, the patient's reported swallowing concerns are to be  expected and stable.    #.  At risk for neck lymphedema:  When patients with head & neck cancers are treated with surgery and/or radiation therapy, there is an associated increased risk of neck lymphedema.  Mr. Fawbush reports that currently he is experiencing what would be considered mild symptoms. he does/does not*** have a neck compression garment and reports wearing/not wearing*** it, as directed.  I encouraged Mr. Gill to continue to wear the compression garment and practice the massage techniques to reduce the presence of lymphedema.  If his symptoms worsen, I would happy to place a formal referral to physical therapy for further evaluation and treatment.     #.  At risk for hypothyroidism: The thyroid gland is often affected after treatment for head & neck cancer.  Mr. Mcevers most recent TSH  was normal at *** on (date).  If appropriate, he will be prescribed thyroid supplement with Levothyroxine. We discussed that he will continue to have serial TSH monitoring for at least the next 5 years as part of his routine follow-up and post-cancer treatment care.   #. At risk for tooth decay/dental concerns: After treatment with radiation for head & neck cancers, patients often experience xerostomia which increases their risk of dental caries. Mr. Roehrig was encouraged to see his dentist 3-4 times per year. The patient should also continue to use his fluoride trays daily, as directed by dentistry.  I encouraged him to reach out to either Dr. Kristin Bruins (oncology dentist) or his primary care dentist with additional questions or concerns.   #.  Lung cancer screening:  [facility] now offers eligible patients lung cancer screening with a low-dose chest CT to aid in early detection, provide more effective treatment options, and ultimately improve survival benefits for patients diagnosed early.  Below is the selection criteria for screening:  Medicare patients: 55-77 years; privately insured patients  55-80 years. Active or former smokers who have quit within the last 15 years. 30+ pack-year history of smoking  Exclusion criteria - No signs/symptoms of lung cancer (i.e., no recent history of hemoptysis and no unexplained weight loss >15 pounds in the last 6 months). Willing and healthy enough to undergo biopsies/surgery if needed.  Mr. Cross currently does/does not*** meet criteria for lung cancer screening.  Therefore, I have/have not*** placed a referral for screening today.    #. Tobacco & alcohol use: Mr. Larke reports that he quit smoking in *** and continues to abstain from all tobacco products.  I congratulated his continued efforts to remain tobacco free.  I also reinforced the importance of avoiding alcohol consumption as well.  Both tobacco and alcohol use in patients with head & neck cancer increases the risk of recurrence.  They also increase the risk of other cancers, as well.  Mr. Marchant states that he voiced understanding of the importance of continuing to remain both tobacco and alcohol-free.    #. Health maintenance and wellness promotion: Cancer patients who consume a diet rich in fruits and vegetables have better overall health and decreased risk of cancer recurrence. Mr. Pirro was encouraged to consume 5-7 servings of fruits and vegetables per day, as tolerated. Mr. Fatzinger was also encouraged to engage in moderate to vigorous exercise for 30 minutes per day most days of the week.   #. Support services/counseling: It is not uncommon for this period of the patient's cancer care trajectory to be one of many emotions and stressors.  We discussed an opportunity for him to participate in the next session of Head & Neck FYNN ("Finding Your New Normal") support group series, designed for patients after they have completed treatment.   Mr. Cordial was encouraged to take advantage of our many other support services programs, support groups, and/or counseling in coping  with his new life as a cancer survivor after completing anti-cancer treatment. The patient was offered support today through active listening and expressive supportive counseling.     Dispo:  -See Dr. Marland Kitchen (ENT) in ***/2017 -Return to cancer center to see Dr. Basilio Cairo in ***/2017 -Return to cancer center to see Survivorship [name] in ***.      A total of *** minutes was spent in the face-to-face care of this patient, with greater than 50% of that time spent in counseling and care-coordination.    [name] Glad, [name] Survivorship  Program [facility] [phone]

## 2023-05-16 ENCOUNTER — Inpatient Hospital Stay: Payer: 59

## 2023-05-16 ENCOUNTER — Inpatient Hospital Stay: Payer: 59 | Admitting: Nurse Practitioner

## 2023-05-26 NOTE — Progress Notes (Deleted)
CLINIC:  Survivorship  REASON FOR VISIT:  Routine follow-up for history of head & neck cancer.  BRIEF ONCOLOGIC HISTORY:  Oncology History  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HCC)  04/28/2020 Cancer Staging   Staging form: Oral Cavity, AJCC 8th Edition - Pathologic stage from 04/28/2020: Stage Unknown (pT2, pNX, cM0) - Signed by Lonie Peak, MD on 05/03/2020   05/03/2020 Initial Diagnosis   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HCC)      INTERVAL HISTORY:  ***  -Pain:  -Nutrition/Diet:  -Dysphagia?:  -Dental issues?: using fluoride trays?  -Last TSH:  -Weight: (LOSS/GAIN) since (last visit)   -Last ENT visit:   -Last Rad Onc visit:  -Last Dentist visit:     ADDITIONAL REVIEW OF SYSTEMS:  ROS    CURRENT MEDICATIONS:  Current Outpatient Medications on File Prior to Visit  Medication Sig Dispense Refill   albuterol (PROVENTIL) (2.5 MG/3ML) 0.083% nebulizer solution Take 2.5 mg by nebulization every 6 (six) hours as needed for wheezing or shortness of breath.     albuterol (VENTOLIN HFA) 108 (90 Base) MCG/ACT inhaler Inhale 2 puffs into the lungs every 6 (six) hours as needed for wheezing or shortness of breath.     carbamide peroxide (DEBROX) 6.5 % OTIC solution 5 drops daily as needed (ear wax removal).     carboxymethylcellulose (REFRESH PLUS) 0.5 % SOLN 1 drop at bedtime. Gel     cetirizine (ZYRTEC) 10 MG tablet Take 10 mg by mouth daily.     cloZAPine (CLOZARIL) 100 MG tablet Take 200 mg by mouth See admin instructions. Take with 50 mg for a total of 250 mg at bedtime     clozapine (CLOZARIL) 50 MG tablet Take 50 mg by mouth See admin instructions. Take with (2) 100 mg for a total of 250 mg at bedtime     dapagliflozin propanediol (FARXIGA) 10 MG TABS tablet Take 10 mg by mouth daily.     fluticasone (FLONASE) 50 MCG/ACT nasal spray Place into both nostrils.     fluvoxaMINE (LUVOX) 100 MG tablet Take 300 mg by mouth at bedtime.     ketoconazole (NIZORAL) 2 % cream Apply 1 application  topically daily.     metoprolol succinate (TOPROL-XL) 50 MG 24 hr tablet Take 1 tablet (50 mg total) by mouth in the morning. Hold if top blood pressure number less than 100 mmHg or heart rate less than 60 bpm (pulse). 90 tablet 1   MOUNJARO 5 MG/0.5ML Pen Inject 5 mg into the skin once a week.     Multiple Vitamin (MULTIVITAMIN) capsule Take 1 capsule by mouth daily. 90 capsule 3   pravastatin (PRAVACHOL) 40 MG tablet TAKE ONE TABLET EACH DAY 90 tablet 3   REFRESH OPTIVE 1-0.9 % GEL Apply 1 drop to eye as directed.     sacubitril-valsartan (ENTRESTO) 24-26 MG Take 1 tablet by mouth 2 (two) times daily. 60 tablet 2   sodium chloride (OCEAN) 0.65 % nasal spray Place 1 spray into the nose as needed for congestion.     SODIUM FLUORIDE 5000 PLUS 1.1 % CREA dental cream SMARTSIG:Topical     timolol (BETIMOL) 0.5 % ophthalmic solution Place 1 drop into both eyes 2 (two) times daily.     tiotropium (SPIRIVA) 18 MCG inhalation capsule Place 18 mcg into inhaler and inhale daily.     Wheat Dextrin (BENEFIBER ON THE GO) PACK Take 1 packet by mouth 3 (three) times daily as needed. 320 each 11   [DISCONTINUED] spironolactone (ALDACTONE)  25 MG tablet Take 0.5 tablets (12.5 mg total) by mouth in the morning. 45 tablet 0   No current facility-administered medications on file prior to visit.    ALLERGIES:  Allergies  Allergen Reactions   Hydrocodone Other (See Comments)    Exacerbation of urinary and fecal incontinence     PHYSICAL EXAM:  There were no vitals filed for this visit. There were no vitals filed for this visit.  Weight Date                     Pre-treatment (RT consult date):    General: Well-nourished, well-appearing male/male*** in no acute distress.  Accompanied/Unaccompanied today.  HEENT: Head is atraumatic and normocephalic.  Pupils equal and reactive to light. Conjunctivae clear without exudate.  Sclerae anicteric. Oral mucosa is pink and moist without lesions.  Tongue  pink, moist, and midline. Oropharynx is pink and moist, without lesions. Lymph: No preauricular, postauricular, cervical, supraclavicular, or infraclavicular lymphadenopathy noted on palpation.   Neck: No palpable masses. Skin on neck is ***.  Cardiovascular: Normal rate and rhythm. Respiratory: Clear to auscultation bilaterally. Chest expansion symmetric without accessory muscle use; breathing non-labored.  GI: Abdomen soft and round. Non-tender, non-distended. Bowel sounds normoactive.  GU: Deferred.   Neuro: No focal deficits. Steady gait.   Psych: Normal mood and affect for situation. Extremities: No edema.  Skin: Warm and dry.    LABORATORY DATA:  None at this visit.***  DIAGNOSTIC IMAGING:  None at this visit.    ASSESSMENT & PLAN:  Mr. Colvin is a pleasant 57 y.o. male/male*** with history of ***, diagnosed in ***(date);  treated with ***; completed treatment on (date).  Patient presents to survivorship clinic today for routine follow-up after finishing treatment.   1. Cancer***:  Mr. Burkett is clinically without evidence of disease or recurrence on physical exam today.     #. Nutritional status: Mr. Wittmeyer reports that he is currently able to consume adequate nutrition by mouth/via tube***.  Weight is stable at *** lbs today.  Encouraged to continue to consume adequate hydration and nutrition, as tolerated.    #. At risk for dysphagia: Given Mr. Tomb treatment for *** cancer, which included surgery and radiation therapy***, he is at risk for chronic dysphagia.  he reports having difficulty with breads and meats, but is able to consume soft foods and liquids without difficulty.  I encouraged him to continue to perform the swallowing exercises, as directed by Verdie Mosher, SLP.  If Mr. Feinstein requires further swallowing or speech therapy evaluation, I will be happy to place that referral, if needed.  Currently, the patient's reported swallowing concerns are to be  expected and stable.    #.  At risk for neck lymphedema:  When patients with head & neck cancers are treated with surgery and/or radiation therapy, there is an associated increased risk of neck lymphedema.  Mr. Fawbush reports that currently he is experiencing what would be considered mild symptoms. he does/does not*** have a neck compression garment and reports wearing/not wearing*** it, as directed.  I encouraged Mr. Gill to continue to wear the compression garment and practice the massage techniques to reduce the presence of lymphedema.  If his symptoms worsen, I would happy to place a formal referral to physical therapy for further evaluation and treatment.     #.  At risk for hypothyroidism: The thyroid gland is often affected after treatment for head & neck cancer.  Mr. Mcevers most recent TSH  was normal at *** on (date).  If appropriate, he will be prescribed thyroid supplement with Levothyroxine. We discussed that he will continue to have serial TSH monitoring for at least the next 5 years as part of his routine follow-up and post-cancer treatment care.   #. At risk for tooth decay/dental concerns: After treatment with radiation for head & neck cancers, patients often experience xerostomia which increases their risk of dental caries. Mr. Roehrig was encouraged to see his dentist 3-4 times per year. The patient should also continue to use his fluoride trays daily, as directed by dentistry.  I encouraged him to reach out to either Dr. Kristin Bruins (oncology dentist) or his primary care dentist with additional questions or concerns.   #.  Lung cancer screening:  [facility] now offers eligible patients lung cancer screening with a low-dose chest CT to aid in early detection, provide more effective treatment options, and ultimately improve survival benefits for patients diagnosed early.  Below is the selection criteria for screening:  Medicare patients: 55-77 years; privately insured patients  55-80 years. Active or former smokers who have quit within the last 15 years. 30+ pack-year history of smoking  Exclusion criteria - No signs/symptoms of lung cancer (i.e., no recent history of hemoptysis and no unexplained weight loss >15 pounds in the last 6 months). Willing and healthy enough to undergo biopsies/surgery if needed.  Mr. Cross currently does/does not*** meet criteria for lung cancer screening.  Therefore, I have/have not*** placed a referral for screening today.    #. Tobacco & alcohol use: Mr. Larke reports that he quit smoking in *** and continues to abstain from all tobacco products.  I congratulated his continued efforts to remain tobacco free.  I also reinforced the importance of avoiding alcohol consumption as well.  Both tobacco and alcohol use in patients with head & neck cancer increases the risk of recurrence.  They also increase the risk of other cancers, as well.  Mr. Marchant states that he voiced understanding of the importance of continuing to remain both tobacco and alcohol-free.    #. Health maintenance and wellness promotion: Cancer patients who consume a diet rich in fruits and vegetables have better overall health and decreased risk of cancer recurrence. Mr. Pirro was encouraged to consume 5-7 servings of fruits and vegetables per day, as tolerated. Mr. Fatzinger was also encouraged to engage in moderate to vigorous exercise for 30 minutes per day most days of the week.   #. Support services/counseling: It is not uncommon for this period of the patient's cancer care trajectory to be one of many emotions and stressors.  We discussed an opportunity for him to participate in the next session of Head & Neck FYNN ("Finding Your New Normal") support group series, designed for patients after they have completed treatment.   Mr. Cordial was encouraged to take advantage of our many other support services programs, support groups, and/or counseling in coping  with his new life as a cancer survivor after completing anti-cancer treatment. The patient was offered support today through active listening and expressive supportive counseling.     Dispo:  -See Dr. Marland Kitchen (ENT) in ***/2017 -Return to cancer center to see Dr. Basilio Cairo in ***/2017 -Return to cancer center to see Survivorship NP in ***.      A total of *** minutes was spent in the face-to-face care of this patient, with greater than 50% of that time spent in counseling and care-coordination.    [name] Glad, NP Survivorship  Program [facility] [phone]

## 2023-05-29 ENCOUNTER — Inpatient Hospital Stay: Payer: 59 | Admitting: Nurse Practitioner

## 2023-05-29 ENCOUNTER — Inpatient Hospital Stay: Payer: 59

## 2023-05-31 ENCOUNTER — Ambulatory Visit: Payer: 59 | Admitting: Podiatry

## 2023-06-04 ENCOUNTER — Ambulatory Visit: Payer: 59 | Admitting: Nurse Practitioner

## 2023-06-04 ENCOUNTER — Other Ambulatory Visit: Payer: 59

## 2023-06-24 ENCOUNTER — Other Ambulatory Visit: Payer: Self-pay | Admitting: Nurse Practitioner

## 2023-06-24 DIAGNOSIS — C069 Malignant neoplasm of mouth, unspecified: Secondary | ICD-10-CM

## 2023-06-24 NOTE — Progress Notes (Unsigned)
CLINIC:  Survivorship  REASON FOR VISIT:  Routine follow-up for history of head & neck cancer.  BRIEF ONCOLOGIC HISTORY:  Oncology History  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HCC)  04/28/2020 Cancer Staging   Staging form: Oral Cavity, AJCC 8th Edition - Pathologic stage from 04/28/2020: Stage Unknown (pT2, pNX, cM0) - Signed by Lonie Peak, MD on 05/03/2020   05/03/2020 Initial Diagnosis   Cancer of hard palate [facility])      INTERVAL HISTORY: [name] Branch returns for follow up/long term survivorship. Last seen by me 05/15/22.  ***  -Pain:  -Nutrition/Diet:  -Dysphagia?:  -Dental issues?: using fluoride trays?  -Last TSH:  -Weight: (LOSS/GAIN) since (last visit)   -Last ENT visit:   -Last Rad Onc visit:  -Last Dentist visit:     ADDITIONAL REVIEW OF SYSTEMS:  ROS    CURRENT MEDICATIONS:  Current Outpatient Medications on File Prior to Visit  Medication Sig Dispense Refill   albuterol (PROVENTIL) (2.5 MG/3ML) 0.083% nebulizer solution Take 2.5 mg by nebulization every 6 (six) hours as needed for wheezing or shortness of breath.     albuterol (VENTOLIN HFA) 108 (90 Base) MCG/ACT inhaler Inhale 2 puffs into the lungs every 6 (six) hours as needed for wheezing or shortness of breath.     carbamide peroxide (DEBROX) 6.5 % OTIC solution 5 drops daily as needed (ear wax removal).     carboxymethylcellulose (REFRESH PLUS) 0.5 % SOLN 1 drop at bedtime. Gel     cetirizine (ZYRTEC) 10 MG tablet Take 10 mg by mouth daily.     cloZAPine (CLOZARIL) 100 MG tablet Take 200 mg by mouth See admin instructions. Take with 50 mg for a total of 250 mg at bedtime     clozapine (CLOZARIL) 50 MG tablet Take 50 mg by mouth See admin instructions. Take with (2) 100 mg for a total of 250 mg at bedtime     dapagliflozin propanediol (FARXIGA) 10 MG TABS tablet Take 10 mg by mouth daily.     fluticasone (FLONASE) 50 MCG/ACT nasal spray Place into both nostrils.     fluvoxaMINE (LUVOX) 100 MG tablet Take 300  mg by mouth at bedtime.     ketoconazole (NIZORAL) 2 % cream Apply 1 application topically daily.     metoprolol succinate (TOPROL-XL) 50 MG 24 hr tablet Take 1 tablet (50 mg total) by mouth in the morning. Hold if top blood pressure number less than 100 mmHg or heart rate less than 60 bpm (pulse). 90 tablet 1   MOUNJARO 5 MG/0.5ML Pen Inject 5 mg into the skin once a week.     Multiple Vitamin (MULTIVITAMIN) capsule Take 1 capsule by mouth daily. 90 capsule 3   pravastatin (PRAVACHOL) 40 MG tablet TAKE ONE TABLET EACH DAY 90 tablet 3   REFRESH OPTIVE 1-0.9 % GEL Apply 1 drop to eye as directed.     sacubitril-valsartan (ENTRESTO) 24-26 MG Take 1 tablet by mouth 2 (two) times daily. 60 tablet 2   sodium chloride (OCEAN) 0.65 % nasal spray Place 1 spray into the nose as needed for congestion.     SODIUM FLUORIDE 5000 PLUS 1.1 % CREA dental cream SMARTSIG:Topical     timolol (BETIMOL) 0.5 % ophthalmic solution Place 1 drop into both eyes 2 (two) times daily.     tiotropium (SPIRIVA) 18 MCG inhalation capsule Place 18 mcg into inhaler and inhale daily.     Wheat Dextrin (BENEFIBER ON THE GO) PACK Take 1 packet by mouth 3 (  three) times daily as needed. 320 each 11   [DISCONTINUED] spironolactone (ALDACTONE) 25 MG tablet Take 0.5 tablets (12.5 mg total) by mouth in the morning. 45 tablet 0   No current facility-administered medications on file prior to visit.    ALLERGIES:  Allergies  Allergen Reactions   Hydrocodone Other (See Comments)    Exacerbation of urinary and fecal incontinence     PHYSICAL EXAM:  There were no vitals filed for this visit. There were no vitals filed for this visit.  Weight Date                     Pre-treatment (RT consult date):    General: Well-nourished, well-appearing male/male*** in no acute distress.  Accompanied/Unaccompanied today.  HEENT: Head is atraumatic and normocephalic.  Pupils equal and reactive to light. Conjunctivae clear without  exudate.  Sclerae anicteric. Oral mucosa is pink and moist without lesions.  Tongue pink, moist, and midline. Oropharynx is pink and moist, without lesions. Lymph: No preauricular, postauricular, cervical, supraclavicular, or infraclavicular lymphadenopathy noted on palpation.   Neck: No palpable masses. Skin on neck is ***.  Cardiovascular: Normal rate and rhythm. Respiratory: Clear to auscultation bilaterally. Chest expansion symmetric without accessory muscle use; breathing non-labored.  GI: Abdomen soft and round. Non-tender, non-distended. Bowel sounds normoactive.  GU: Deferred.   Neuro: No focal deficits. Steady gait.   Psych: Normal mood and affect for situation. Extremities: No edema.  Skin: Warm and dry.    LABORATORY DATA:  None at this visit.***  DIAGNOSTIC IMAGING:  None at this visit.    ASSESSMENT & PLAN:  [name] Branch is a pleasant 57 y.o. male/male*** with history of ***, diagnosed in ***(date);  treated with ***; completed treatment on (date).  Patient presents to survivorship clinic today for routine follow-up after finishing treatment.   1. Cancer***:  [name] Branch is clinically without evidence of disease or recurrence on physical exam today.     #. Nutritional status: [name] Branch reports that he is currently able to consume adequate nutrition by mouth/via tube***.  Weight is stable at *** lbs today.  Encouraged to continue to consume adequate hydration and nutrition, as tolerated.    #. At risk for dysphagia: Given [name] Branch treatment for *** cancer, which included surgery and radiation therapy***, he is at risk for chronic dysphagia.  he reports having difficulty with breads and meats, but is able to consume soft foods and liquids without difficulty.  I encouraged him to continue to perform the swallowing exercises, as directed by Verdie Mosher, SLP.  If [name] Branch requires further swallowing or speech therapy evaluation, I will be happy to place that  referral, if needed.  Currently, the patient's reported swallowing concerns are to be expected and stable.    #.  At risk for neck lymphedema:  When patients with head & neck cancers are treated with surgery and/or radiation therapy, there is an associated increased risk of neck lymphedema.  [name] Branch reports that currently he is experiencing what would be considered mild symptoms. he does/does not*** have a neck compression garment and reports wearing/not wearing*** it, as directed.  I encouraged [name] Branch to continue to wear the compression garment and practice the massage techniques to reduce the presence of lymphedema.  If his symptoms worsen, I would happy to place a formal referral to physical therapy for further evaluation and treatment.     #.  At risk for hypothyroidism: The thyroid gland is often affected  after treatment for head & neck cancer.  [name] Branch most recent TSH was normal at *** on (date).  If appropriate, he will be prescribed thyroid supplement with Levothyroxine. We discussed that he will continue to have serial TSH monitoring for at least the next 5 years as part of his routine follow-up and post-cancer treatment care.   #. At risk for tooth decay/dental concerns: After treatment with radiation for head & neck cancers, patients often experience xerostomia which increases their risk of dental caries. [name] Branch was encouraged to see his dentist 3-4 times per year. The patient should also continue to use his fluoride trays daily, as directed by dentistry.  I encouraged him to reach out to either Dr. Kristin Bruins (oncology dentist) or his primary care dentist with additional questions or concerns.   #.  Lung cancer screening:  [facility] now offers eligible patients lung cancer screening with a low-dose chest CT to aid in early detection, provide more effective treatment options, and ultimately improve survival benefits for patients diagnosed early.  Below is the selection  criteria for screening:  Medicare patients: 55-77 years; privately insured patients 55-80 years. Active or former smokers who have quit within the last 15 years. 30+ pack-year history of smoking  Exclusion criteria - No signs/symptoms of lung cancer (i.e., no recent history of hemoptysis and no unexplained weight loss >15 pounds in the last 6 months). Willing and healthy enough to undergo biopsies/surgery if needed.  [name] Branch currently does/does not*** meet criteria for lung cancer screening.  Therefore, I have/have not*** placed a referral for screening today.    #. Tobacco & alcohol use: [name] Branch reports that he quit smoking in *** and continues to abstain from all tobacco products.  I congratulated his continued efforts to remain tobacco free.  I also reinforced the importance of avoiding alcohol consumption as well.  Both tobacco and alcohol use in patients with head & neck cancer increases the risk of recurrence.  They also increase the risk of other cancers, as well.  [name] Branch states that he voiced understanding of the importance of continuing to remain both tobacco and alcohol-free.    #. Health maintenance and wellness promotion: Cancer patients who consume a diet rich in fruits and vegetables have better overall health and decreased risk of cancer recurrence. [name] Branch was encouraged to consume 5-7 servings of fruits and vegetables per day, as tolerated. [name] Branch was also encouraged to engage in moderate to vigorous exercise for 30 minutes per day most days of the week.   #. Support services/counseling: It is not uncommon for this period of the patient's cancer care trajectory to be one of many emotions and stressors.  We discussed an opportunity for him to participate in the next session of Head & Neck FYNN ("Finding Your New Normal") support group series, designed for patients after they have completed treatment.   [name] Branch was encouraged to take advantage of our  many other support services programs, support groups, and/or counseling in coping with his new life as a cancer survivor after completing anti-cancer treatment. The patient was offered support today through active listening and expressive supportive counseling.     Dispo:  -See Dr. Marland Kitchen (ENT) in ***/2017 -Return to cancer center to see Dr. Basilio Cairo in ***/2017 -Return to cancer center to see Survivorship NP in ***.      A total of *** minutes was spent in the face-to-face care of this patient, with greater than 50% of that  time spent in counseling and care-coordination.    Santiago Glad, NP Survivorship Program [facility] [phone]

## 2023-06-25 ENCOUNTER — Inpatient Hospital Stay: Payer: 59 | Attending: Nurse Practitioner

## 2023-06-25 ENCOUNTER — Inpatient Hospital Stay (HOSPITAL_BASED_OUTPATIENT_CLINIC_OR_DEPARTMENT_OTHER): Payer: 59 | Admitting: Nurse Practitioner

## 2023-06-25 ENCOUNTER — Telehealth: Payer: Self-pay

## 2023-06-25 ENCOUNTER — Encounter: Payer: Self-pay | Admitting: Nurse Practitioner

## 2023-06-25 VITALS — BP 135/80 | HR 88 | Temp 98.5°F | Resp 16 | Wt 244.5 lb

## 2023-06-25 DIAGNOSIS — Z79899 Other long term (current) drug therapy: Secondary | ICD-10-CM | POA: Insufficient documentation

## 2023-06-25 DIAGNOSIS — Z923 Personal history of irradiation: Secondary | ICD-10-CM | POA: Diagnosis not present

## 2023-06-25 DIAGNOSIS — Z85819 Personal history of malignant neoplasm of unspecified site of lip, oral cavity, and pharynx: Secondary | ICD-10-CM | POA: Diagnosis present

## 2023-06-25 DIAGNOSIS — C069 Malignant neoplasm of mouth, unspecified: Secondary | ICD-10-CM

## 2023-06-25 DIAGNOSIS — C050 Malignant neoplasm of hard palate: Secondary | ICD-10-CM

## 2023-06-25 LAB — CMP (CANCER CENTER ONLY)
ALT: 40 U/L (ref 0–44)
AST: 26 U/L (ref 15–41)
Albumin: 4.2 g/dL (ref 3.5–5.0)
Alkaline Phosphatase: 87 U/L (ref 38–126)
Anion gap: 5 (ref 5–15)
BUN: 13 mg/dL (ref 6–20)
CO2: 28 mmol/L (ref 22–32)
Calcium: 9.5 mg/dL (ref 8.9–10.3)
Chloride: 105 mmol/L (ref 98–111)
Creatinine: 1.25 mg/dL — ABNORMAL HIGH (ref 0.61–1.24)
GFR, Estimated: >60 mL/min (ref >60)
Glucose, Bld: 131 mg/dL — ABNORMAL HIGH (ref 70–99)
Potassium: 4.1 mmol/L (ref 3.5–5.1)
Sodium: 138 mmol/L (ref 135–145)
Total Bilirubin: 0.6 mg/dL (ref <1.2)
Total Protein: 7.4 g/dL (ref 6.5–8.1)

## 2023-06-25 LAB — CBC WITH DIFFERENTIAL (CANCER CENTER ONLY)
Abs Immature Granulocytes: 0.02 10*3/uL (ref 0.00–0.07)
Basophils Absolute: 0 10*3/uL (ref 0.0–0.1)
Basophils Relative: 0 %
Eosinophils Absolute: 0.1 10*3/uL (ref 0.0–0.5)
Eosinophils Relative: 1 %
HCT: 47.2 % (ref 39.0–52.0)
Hemoglobin: 15.8 g/dL (ref 13.0–17.0)
Immature Granulocytes: 0 %
Lymphocytes Relative: 20 %
Lymphs Abs: 1.4 10*3/uL (ref 0.7–4.0)
MCH: 31.6 pg (ref 26.0–34.0)
MCHC: 33.5 g/dL (ref 30.0–36.0)
MCV: 94.4 fL (ref 80.0–100.0)
Monocytes Absolute: 0.5 10*3/uL (ref 0.1–1.0)
Monocytes Relative: 8 %
Neutro Abs: 5 10*3/uL (ref 1.7–7.7)
Neutrophils Relative %: 71 %
Platelet Count: 135 10*3/uL — ABNORMAL LOW (ref 150–400)
RBC: 5 MIL/uL (ref 4.22–5.81)
RDW: 15.2 % (ref 11.5–15.5)
WBC Count: 7 10*3/uL (ref 4.0–10.5)
nRBC: 0 % (ref 0.0–0.2)

## 2023-06-25 LAB — TSH: TSH: 0.697 u[IU]/mL (ref 0.350–4.500)

## 2023-06-25 NOTE — Telephone Encounter (Signed)
[name] Branch could you please fax my note to [facility] on [address], pt planning to transfer there and want them to know radiation exposure to certain teeth. thanks!   Faxed and confirmation received

## 2023-06-26 ENCOUNTER — Telehealth: Payer: Self-pay

## 2023-06-26 NOTE — Telephone Encounter (Signed)
   Name: [name] Branch  DOB: 05-30-1966  MRN: [id number]  Primary Cardiologist: None  Chart reviewed as part of pre-operative protocol coverage. Because of [name] Branch past medical history and time since last visit, he will require a follow-up telephone visit in order to better assess preoperative cardiovascular risk.  Pre-op covering staff: - Please schedule appointment and call patient to inform them. If patient already had an upcoming appointment within acceptable timeframe, please add "pre-op clearance" to the appointment notes so provider is aware. - Please contact requesting surgeon's office via preferred method (i.e, phone, fax) to inform them of need for appointment prior to surgery.  No medications indicated as needing held.   Sharlene Dory, PA-C  06/26/2023, 4:09 PM

## 2023-06-26 NOTE — Telephone Encounter (Signed)
1st attempt to reach pt regarding surgical clearance and the need for an TELE appointment.  Left pt a detailed message to call back and get that scheduled.

## 2023-06-26 NOTE — Telephone Encounter (Signed)
   Pre-operative Risk Assessment    Patient Name: [name] Branch  DOB: 29-Mar-1966 MRN: [id number]     Request for Surgical Clearance    Procedure:  Dental Extraction - Amount of Teeth to be Pulled:  4  Date of Surgery:  Clearance TBD                                 Surgeon:  Not Indicated Surgeon's Group or Practice Name:  [facility] Phone number:  [phone] Fax number:  [phone]   Type of Clearance Requested:   - Medical    Type of Anesthesia:  Local    Additional requests/questions:    [name]   06/26/2023, 2:15 PM

## 2023-07-11 ENCOUNTER — Telehealth: Payer: Self-pay | Admitting: *Deleted

## 2023-07-11 NOTE — Telephone Encounter (Signed)
S/w the pt's sister (DPR and she is his guardian as well). Tele appt has been scheduled 07/18/23. Med rec and consent are done.

## 2023-07-11 NOTE — Telephone Encounter (Signed)
S/w the pt's sister (DPR and she is his guardian as well). Tele appt has been scheduled 07/18/23. Med rec and consent are done.     Patient Consent for Virtual Visit        [name] has provided verbal consent on 07/11/2023 for a virtual visit (video or telephone).   CONSENT FOR VIRTUAL VISIT FOR:  [name] Branch  By participating in this virtual visit I agree to the following:  I hereby voluntarily request, consent and authorize [facility] and its employed or contracted physicians, physician assistants, nurse practitioners or other licensed health care professionals (the Practitioner), to provide me with telemedicine health care services (the "Services") as deemed necessary by the treating Practitioner. I acknowledge and consent to receive the Services by the Practitioner via telemedicine. I understand that the telemedicine visit will involve communicating with the Practitioner through live audiovisual communication technology and the disclosure of certain medical information by electronic transmission. I acknowledge that I have been given the opportunity to request an in-person assessment or other available alternative prior to the telemedicine visit and am voluntarily participating in the telemedicine visit.  I understand that I have the right to withhold or withdraw my consent to the use of telemedicine in the course of my care at any time, without affecting my right to future care or treatment, and that the Practitioner or I may terminate the telemedicine visit at any time. I understand that I have the right to inspect all information obtained and/or recorded in the course of the telemedicine visit and may receive copies of available information for a reasonable fee.  I understand that some of the potential risks of receiving the Services via telemedicine include:  Delay or interruption in medical evaluation due to technological equipment failure or disruption; Information  transmitted may not be sufficient (e.g. poor resolution of images) to allow for appropriate medical decision making by the Practitioner; and/or  In rare instances, security protocols could fail, causing a breach of personal health information.  Furthermore, I acknowledge that it is my responsibility to provide information about my medical history, conditions and care that is complete and accurate to the best of my ability. I acknowledge that Practitioner's advice, recommendations, and/or decision may be based on factors not within their control, such as incomplete or inaccurate data provided by me or distortions of diagnostic images or specimens that may result from electronic transmissions. I understand that the practice of medicine is not an exact science and that Practitioner makes no warranties or guarantees regarding treatment outcomes. I acknowledge that a copy of this consent can be made available to me via my patient portal [facility] MyChart), or I can request a printed copy by calling the office of [facility].    I understand that my insurance will be billed for this visit.   I have read or had this consent read to me. I understand the contents of this consent, which adequately explains the benefits and risks of the Services being provided via telemedicine.  I have been provided ample opportunity to ask questions regarding this consent and the Services and have had my questions answered to my satisfaction. I give my informed consent for the services to be provided through the use of telemedicine in my medical care

## 2023-07-18 ENCOUNTER — Ambulatory Visit: Payer: 59 | Attending: Cardiology | Admitting: Nurse Practitioner

## 2023-07-18 DIAGNOSIS — Z01810 Encounter for preprocedural cardiovascular examination: Secondary | ICD-10-CM | POA: Diagnosis not present

## 2023-07-18 NOTE — Progress Notes (Signed)
Virtual Visit via Telephone Note   Because of [name] Branch's co-morbid illnesses, he is at least at moderate risk for complications without adequate follow up.  This format is felt to be most appropriate for this patient at this time.  The patient did not have access to video technology/had technical difficulties with video requiring transitioning to audio format only (telephone).  All issues noted in this document were discussed and addressed.  No physical exam could be performed with this format.  Please refer to the patient's chart for his consent to telehealth for [facility].  Evaluation Performed:  Preoperative cardiovascular risk assessment _____________   Date:  07/18/2023   Patient ID:  [name] Branch, DOB [birth year], MRN [id number] Patient Location:  Home Provider location:   Office  Primary Care Provider:  Loura Back, NP Primary Cardiologist:  Tessa Lerner, DO  Chief Complaint / Patient Profile   57 y.o. 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  History of Present Illness    [name] Branch is a 57 y.o. male who presents via audio/video conferencing for a telehealth visit today accompanied by his legal guardian, Mercie Eon.  Pt was last seen in cardiology clinic on 02/21/2023 by Dr. Odis Hollingshead.  At that time Gaylord Carrino was doing well.  The patient is now pending procedure as outlined above. Since his last visit, he has done well from a cardiac standpoint.   He denies chest pain, palpitations, dyspnea, pnd, orthopnea, n, v, dizziness, syncope, edema, weight gain, or early satiety. All other systems  reviewed and are otherwise negative except as noted above.   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  Qualifier: Diagnosis of  By: Silvestre Mesi MD, Yogesh     Obstruction of colon (HCC)    recurrent   Sarcoidosis    Schizophrenia (HCC)    Sleep apnea    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   Total self-care deficit    per caregiver he needs to have help bathing and cleaning self and needs help with all daily needs now    Past Surgical History:  Procedure Laterality Date   BALLOON DILATION N/A 07/26/2021   Procedure: BALLOON DILATION;  Surgeon: Napoleon Form, MD;  Location: [facility];  Service: Endoscopy;  Laterality: N/A;   BIOPSY  07/26/2021   Procedure: BIOPSY;  Surgeon: Napoleon Form, MD;  Location: [facility];  Service: Endoscopy;;   COLECTOMY N/A 09/25/2014   Procedure: OPEN ABDOMINAL COLECTOMY;  Surgeon: Karie Soda, MD;  Location: [facility];  Service: General;  Laterality: N/A;  COLONOSCOPY  2012   FLEXIBLE SIGMOIDOSCOPY N/A 07/26/2021   Procedure: FLEXIBLE SIGMOIDOSCOPY;  Surgeon: Napoleon Form, MD;  Location: [facility];  Service: Endoscopy;  Laterality: N/A;   groin surgery     boil drained   MOUTH SURGERY     PROCTOSCOPY N/A 09/25/2014   Procedure: RIGID PROCTOSCOPY;  Surgeon: Karie Soda, MD;  Location: [facility];  Service: General;  Laterality: N/A;    Allergies  Allergies  Allergen Reactions   Hydrocodone Other (See  Comments)    Exacerbation of urinary and fecal incontinence    Home Medications    Prior to Admission medications   Medication Sig Start Date End Date Taking? Authorizing Provider  albuterol (PROVENTIL) (2.5 MG/3ML) 0.083% nebulizer solution Take 2.5 mg by nebulization every 6 (six) hours as needed for wheezing or shortness of breath.    [provider]  albuterol (VENTOLIN HFA) 108 (90 Base) MCG/ACT inhaler Inhale 2 puffs into the lungs every 6 (six) hours as needed for wheezing or shortness of breath.    [provider]  carbamide peroxide (DEBROX) 6.5 % OTIC solution 5 drops daily as needed (ear wax removal).    [provider]  carboxymethylcellulose (REFRESH PLUS) 0.5 % SOLN 1 drop at bedtime. Gel    [provider]  cetirizine (ZYRTEC) 10 MG tablet Take 10 mg by mouth daily. 08/30/20   [provider]  cloZAPine (CLOZARIL) 100 MG tablet Take 200 mg by mouth See admin instructions. Take with 50 mg for a total of 250 mg at bedtime    [provider]  clozapine (CLOZARIL) 50 MG tablet Take 50 mg by mouth See admin instructions. Take with (2) 100 mg for a total of 250 mg at bedtime    [provider]  dapagliflozin propanediol (FARXIGA) 10 MG TABS tablet Take 10 mg by mouth daily.    [provider]  fluticasone (FLONASE) 50 MCG/ACT nasal spray Place into both nostrils. 08/31/21   [provider]  fluvoxaMINE (LUVOX) 100 MG tablet Take 300 mg by mouth at bedtime. 11/30/20   [provider]  ketoconazole (NIZORAL) 2 % cream Apply 1 application topically daily.    [provider]  metoprolol succinate (TOPROL-XL) 50 MG 24 hr tablet Take 1 tablet (50 mg total) by mouth in the morning. Hold if top blood pressure number less than 100 mmHg or heart rate less than 60 bpm (pulse). 07/05/21 02/21/23  Tolia, Sunit, DO  MOUNJARO 5 MG/0.5ML Pen Inject 5 mg into the skin once a week. 11/01/22   [provider]   Multiple Vitamin (MULTIVITAMIN) capsule Take 1 capsule by mouth daily. 03/25/18   Marcine Matar, MD  pravastatin (PRAVACHOL) 40 MG tablet TAKE ONE TABLET EACH DAY 05/31/21   Tolia, Sunit, DO  REFRESH OPTIVE 1-0.9 % GEL Apply 1 drop to eye as directed. 09/13/21   [provider]  sacubitril-valsartan (ENTRESTO) 24-26 MG Take 1 tablet by mouth 2 (two) times daily. 08/25/22   Tolia, Sunit, DO  sodium chloride (OCEAN) 0.65 % nasal spray Place 1 spray into the nose as needed for congestion.    [provider]  SODIUM FLUORIDE 5000 PLUS 1.1 % CREA dental cream SMARTSIG:Topical 09/13/21   [provider]  timolol (BETIMOL) 0.5 % ophthalmic solution Place 1 drop into both eyes 2 (two) times daily.    [provider]  tiotropium (SPIRIVA) 18 MCG inhalation capsule Place 18 mcg into inhaler and inhale daily.  [provider]  Wheat Dextrin (BENEFIBER ON THE GO) PACK Take 1 packet by mouth 3 (three) times daily as needed. 10/31/21   Unk Lightning, PA  spironolactone (ALDACTONE) 25 MG tablet Take 0.5 tablets (12.5 mg total) by mouth in the morning. 09/02/20 09/25/20  Tessa Lerner, DO    Physical Exam    Vital Signs:  Derrall Braccia does not have vital signs available for review today.  Given telephonic nature of communication, physical exam is limited. AAOx3. NAD. Normal affect.  Speech and respirations are unlabored.  Accessory Clinical Findings    None  Assessment & Plan    1.  Preoperative Cardiovascular Risk Assessment:  According to the Revised Cardiac Risk Index (RCRI), his Perioperative Risk of Major Cardiac Event is (%): 0.9. His Functional Capacity in METs is: 4.4 according to the Duke Activity Status Index (DASI). Therefore, based on ACC/AHA guidelines, patient would be at acceptable risk for the planned procedure without further cardiovascular testing.   The patient was advised that if he develops new symptoms prior to surgery to  contact our office to arrange for a follow-up visit, and he verbalized understanding.  SBE prophylaxis in not required from a cardiac standpoint.    A copy of this note will be routed to requesting surgeon.  Time:   Today, I have spent 5 minutes with the patient with telehealth technology discussing medical history, symptoms, and management plan.     Joylene Grapes, NP  07/18/2023, 9:56 AM

## 2023-07-19 ENCOUNTER — Encounter: Payer: Self-pay | Admitting: Podiatry

## 2023-07-19 ENCOUNTER — Ambulatory Visit (INDEPENDENT_AMBULATORY_CARE_PROVIDER_SITE_OTHER): Payer: 59 | Admitting: Podiatry

## 2023-07-19 DIAGNOSIS — B351 Tinea unguium: Secondary | ICD-10-CM | POA: Diagnosis not present

## 2023-07-19 DIAGNOSIS — D492 Neoplasm of unspecified behavior of bone, soft tissue, and skin: Secondary | ICD-10-CM | POA: Diagnosis not present

## 2023-07-19 DIAGNOSIS — M79675 Pain in left toe(s): Secondary | ICD-10-CM

## 2023-07-19 DIAGNOSIS — M79674 Pain in right toe(s): Secondary | ICD-10-CM | POA: Diagnosis not present

## 2023-07-20 MED ORDER — FLUOROURACIL 5 % EX CREA: TOPICAL_CREAM | Freq: Two times a day (BID) | CUTANEOUS | 0 refills | Status: AC

## 2023-07-22 NOTE — Progress Notes (Signed)
  Subjective:  Patient ID: [name] Branch, male    DOB: 08/17/1965,  MRN: [id number]  Chief Complaint  Patient presents with   Diabetes    Patient is here to get his toe nails cut down , and to look at his feet to make sure everything is ok     57 y.o. male presents with the above complaint. History confirmed with patient.  Returns today for follow-up of his thick and brown discolored toenails, prior debridements have been helpful in reducing pain and improving function. Also presenting with painful hyperkeratotic lesions to both feet.  Objective:  Physical Exam: warm, good capillary refill, normal DP and PT pulses, and bilateral he has multiple dystrophic thickened elongated brown discolored toenails with subungual debris.  Painful hyperkeratotic lesions present to the left plantar medial heel, right medial first MPJ, tender with direct and lateral pressure.  Evidence of petechial bleeding noted.  Assessment:   1. Pain due to onychomycosis of toenails of both feet   2. Skin neoplasm       Plan:  Patient was evaluated and treated and all questions answered.  Discussed the etiology and treatment options for the condition in detail with the patient. Prior regular debridements have been helpful for him. Recommended debridement of the nails today. Sharp and mechanical debridement performed of all painful and mycotic nails today. Nails debrided in length and thickness using a nail nipper to level of comfort. Discussed treatment options including appropriate shoe gear. Follow up as needed for painful nails.  Painful hyperkeratotic skin lesions noted bilaterally. Skin neoplasm with appearance similar to mosaic wart.  Lesions x 2 were sharply debrided using a 312 scalpel blade down to patient tolerance.  Prescription written for Efudex 5% cream to be applied to the lesions under occlusion twice a day.    Return in about 3 months (around 10/17/2023) for Routine Foot Care.

## 2023-08-24 ENCOUNTER — Ambulatory Visit: Payer: Self-pay | Admitting: Cardiology

## 2023-08-28 ENCOUNTER — Ambulatory Visit: Payer: 59 | Admitting: Cardiology

## 2023-10-16 ENCOUNTER — Other Ambulatory Visit: Payer: Self-pay | Admitting: Registered Nurse

## 2023-10-16 DIAGNOSIS — K7460 Unspecified cirrhosis of liver: Secondary | ICD-10-CM

## 2023-10-17 ENCOUNTER — Encounter: Payer: Self-pay | Admitting: Cardiology

## 2023-10-17 ENCOUNTER — Ambulatory Visit: Payer: 59 | Attending: Cardiology | Admitting: Cardiology

## 2023-10-17 VITALS — BP 110/72 | HR 86 | Resp 16 | Ht 74.0 in | Wt 246.4 lb

## 2023-10-17 DIAGNOSIS — F209 Schizophrenia, unspecified: Secondary | ICD-10-CM | POA: Diagnosis not present

## 2023-10-17 DIAGNOSIS — E782 Mixed hyperlipidemia: Secondary | ICD-10-CM | POA: Diagnosis not present

## 2023-10-17 DIAGNOSIS — I4510 Unspecified right bundle-branch block: Secondary | ICD-10-CM | POA: Diagnosis not present

## 2023-10-17 DIAGNOSIS — I5032 Chronic diastolic (congestive) heart failure: Secondary | ICD-10-CM | POA: Diagnosis not present

## 2023-10-17 DIAGNOSIS — I5042 Chronic combined systolic (congestive) and diastolic (congestive) heart failure: Secondary | ICD-10-CM

## 2023-10-17 MED ORDER — ENTRESTO 24-26 MG PO TABS: 1.0000 | ORAL_TABLET | Freq: Two times a day (BID) | ORAL | 3 refills | Status: AC

## 2023-10-17 MED ORDER — DAPAGLIFLOZIN PROPANEDIOL 10 MG PO TABS: 10.0000 mg | ORAL_TABLET | Freq: Every day | ORAL | 3 refills | Status: AC

## 2023-10-17 MED ORDER — METOPROLOL SUCCINATE ER 50 MG PO TB24: 50.0000 mg | ORAL_TABLET | Freq: Every morning | ORAL | 3 refills | Status: AC

## 2023-10-17 NOTE — Patient Instructions (Signed)
 Medication Instructions:  Your physician recommends that you continue on your current medications as directed. Please refer to the Current Medication list given to you today.  Refills for Sherryll Burger, Farxiga, and Metoprolol Succinate (Toprol-XL) have been sent to the pharmacy.  *If you need a refill on your cardiac medications before your next appointment, please call your pharmacy*  Lab Work: None ordered today. If you have labs (blood work) drawn today and your tests are completely normal, you will receive your results only by: MyChart Message (if you have MyChart) OR A paper copy in the mail If you have any lab test that is abnormal or we need to change your treatment, we will call you to review the results.  Testing/Procedures: Your physician has requested that you have an echocardiogram to be completed in February 2026. Echocardiography is a painless test that uses sound waves to create images of your heart. It provides your doctor with information about the size and shape of your heart and how well your heart's chambers and valves are working. This procedure takes approximately one hour. There are no restrictions for this procedure. Please do NOT wear cologne, perfume, aftershave, or lotions (deodorant is allowed). Please arrive 15 minutes prior to your appointment time.  Please note: We ask at that you not bring children with you during ultrasound (echo/ vascular) testing. Due to room size and safety concerns, children are not allowed in the ultrasound rooms during exams. Our front office staff cannot provide observation of children in our lobby area while testing is being conducted. An adult accompanying a patient to their appointment will only be allowed in the ultrasound room at the discretion of the ultrasound technician under special circumstances. We apologize for any inconvenience.   Follow-Up: At [facility], you and your health needs are our priority.  As part of our continuing  mission to provide you with exceptional heart care, we have created designated Provider Care Teams.  These Care Teams include your primary Cardiologist (physician) and Advanced Practice Providers (APPs -  Physician Assistants and Nurse Practitioners) who all work together to provide you with the care you need, when you need it.  We recommend signing up for the patient portal called "MyChart".  Sign up information is provided on this After Visit Summary.  MyChart is used to connect with patients for Virtual Visits (Telemedicine).  Patients are able to view lab/test results, encounter notes, upcoming appointments, etc.  Non-urgent messages can be sent to your provider as well.   To learn more about what you can do with MyChart, go to [email or web address].    Your next appointment:   1 year(s)  The format for your next appointment:   In Person  Provider:   Tessa Lerner, DO {

## 2023-10-17 NOTE — Progress Notes (Signed)
 Cardiology Office Note:  .   Date:  10/17/2023  ID:  [name] Branch, DOB Oct 30, 1965, MRN [id number] PCP:  Loura Back, NP  Former Cardiology Providers: Dr. Youlanda Mighty and Hart Robinsons PA.  [facility] Providers Cardiologist:  Tessa Lerner, DO , [facility] (established care 07/12/2020) Electrophysiologist:  None  Click to update primary MD,subspecialty MD or APP then REFRESH:1}    Chief Complaint  Patient presents with   Chronic heart failure with preserved ejection fraction   Follow-up    History of Present Illness: .   [name] Branch is a 58 y.o. 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   When he received care at [facility] with Dr. 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  His cardiomyopathy dating back to 2016 based on electronic medical records and at that time his LVEF was 35% with global hypokinesis at the time of his salivary carcinoma surgery.  He establish care with [place] back in 2021 with up titration of medical therapy LVEF is improved to 50-55% with grade 1 diastolic dysfunction.  Patient presents today for 2-month follow-up visit.  Since last office visit patient denies any anginal chest pain or heart failure symptoms.  No hospitalizations or urgent care visits for cardiovascular reasons.  He has been compliant with his medical therapy.  Since last office visit patient has gained approximately 6 pounds over 6 months secondary to dietary indiscretion.  He recently had labs with PCP.  I do not have those available for review but his sister will arrange a copy for the office.  She  is requesting refills on Farxiga, Toprol-XL, and Entresto.  Spironolactone has been discontinued for reasons unknown at this time.  Review of Systems: .   Review of Systems  Cardiovascular:  Negative for chest pain, claudication, irregular heartbeat, leg swelling, near-syncope, orthopnea, palpitations, paroxysmal nocturnal dyspnea and syncope.  Respiratory:  Negative for shortness of breath.   Hematologic/Lymphatic: Negative for bleeding problem.    Studies Reviewed:   EKG: EKG Interpretation Date/Time:  Wednesday October 17 2023 15:17:04 EDT Ventricular Rate:  86 PR Interval:  168 QRS Duration:  162 QT Interval:  400 QTC Calculation: 478 R Axis:   -64  Text Interpretation: Normal sinus rhythm Right bundle branch block Left anterior fascicular block Bifascicular block Minimal voltage criteria for LVH, may be normal variant ( R in aVL ) When compared with ECG of 28-Jul-2020 19:32, No significant change since last tracing Confirmed by Tessa Lerner ([id number]) on 10/17/2023 3:36:03 PM  Echocardiogram: 03/10/2020 @ [facility]: LVEF 35%.  12/06/2020: LVEF 50-55%, grade 1 diastolic dysfunction, mild AR, mild TR  Stress Testing: None  Cardiac monitor: None  RADIOLOGY: NA  Risk Assessment/Calculations:   NA   Labs:       Latest Ref Rng & Units 06/25/2023    9:15 AM 05/12/2021    6:20 PM 09/25/2020    5:46 PM  CBC  WBC 4.0 - 10.5 K/uL 7.0  5.6  4.5   Hemoglobin 13.0 - 17.0 g/dL 96.2  95.2  84.1   Hematocrit 39.0 - 52.0 % 47.2  45.9  43.3   Platelets 150 - 400 K/uL 135  159  145        Latest Ref  Rng & Units 06/25/2023    9:15 AM 05/12/2021    4:20 PM 10/19/2020    3:09 PM  BMP  Glucose 70 - 99 mg/dL 413  91  97   BUN 6 - 20 mg/dL 13  9  10    Creatinine 0.61 - 1.24 mg/dL 2.44  0.10  2.72   BUN/Creat Ratio 9 - 20   9   Sodium 135 - 145 mmol/L 138  140  143   Potassium 3.5 - 5.1 mmol/L 4.1  4.1  4.5   Chloride 98 - 111 mmol/L 105  105  102   CO2 22 - 32 mmol/L 28  28  24     Calcium 8.9 - 10.3 mg/dL 9.5  9.2  9.1       Latest Ref Rng & Units 06/25/2023    9:15 AM 05/12/2021    4:20 PM 10/19/2020    3:09 PM  CMP  Glucose 70 - 99 mg/dL 536  91  97   BUN 6 - 20 mg/dL 13  9  10    Creatinine 0.61 - 1.24 mg/dL 6.44  0.34  7.42   Sodium 135 - 145 mmol/L 138  140  143   Potassium 3.5 - 5.1 mmol/L 4.1  4.1  4.5   Chloride 98 - 111 mmol/L 105  105  102   CO2 22 - 32 mmol/L 28  28  24    Calcium 8.9 - 10.3 mg/dL 9.5  9.2  9.1   Total Protein 6.5 - 8.1 g/dL 7.4     Total Bilirubin <1.2 mg/dL 0.6     Alkaline Phos 38 - 126 U/L 87     AST 15 - 41 U/L 26     ALT 0 - 44 U/L 40       Lab Results  Component Value Date   CHOL 133 03/25/2018   HDL 59 03/25/2018   LDLCALC 61 03/25/2018   TRIG 64 03/25/2018   CHOLHDL 2.3 03/25/2018   No results for input(s): "LIPOA" in the last 8760 hours. No components found for: "NTPROBNP" No results for input(s): "PROBNP" in the last 8760 hours. Recent Labs    06/25/23 0915  TSH 0.697    Physical Exam:    Today's Vitals   10/17/23 1513  BP: 110/72  Pulse: 86  Resp: 16  SpO2: 93%  Weight: 246 lb 6.4 oz (111.8 kg)  Height: 6\' 2"  (1.88 m)   Body mass index is 31.64 kg/m. Wt Readings from Last 3 Encounters:  10/17/23 246 lb 6.4 oz (111.8 kg)  06/25/23 244 lb 8 oz (110.9 kg)  02/21/23 240 lb 9.6 oz (109.1 kg)    Physical Exam  Constitutional: No distress.  Age appropriate, hemodynamically stable.   Neck: No JVD present.  Cardiovascular: Normal rate, regular rhythm, S1 normal, S2 normal, intact distal pulses and normal pulses. Exam reveals no gallop, no S3 and no S4.  No murmur heard. Pulmonary/Chest: Effort normal and breath sounds normal. No stridor. He has no wheezes. He has no rales.  Abdominal: Soft. Bowel sounds are normal. He exhibits no distension. There is no abdominal tenderness.  Musculoskeletal:        General: No edema.     Cervical back: Neck supple.  Neurological: He is alert and oriented to  person, place, and time. He has intact cranial nerves (2-12).  Skin: Skin is warm and moist.   Impression & Recommendation(s):  Impression:   ICD-10-CM   1. Chronic heart failure  with preserved ejection fraction (HFpEF) (HCC)  I50.32 EKG 12-Lead    ECHOCARDIOGRAM COMPLETE    2. Heart failure with improved ejection fraction (HFimpEF) (HCC)  I50.32     3. RBBB  I45.10     4. Mixed hyperlipidemia  E78.2     5. Schizophrenia, unspecified type (HCC)  F20.9     6. Chronic combined systolic and diastolic CHF, NYHA class 2 (HCC)  I50.42 metoprolol succinate (TOPROL-XL) 50 MG 24 hr tablet       Recommendation(s):  Chronic heart failure with preserved ejection fraction (HFpEF) (HCC) Heart failure with improved ejection fraction (HFimpEF) (HCC) Echo 02/2020: LVEF 35%. Echo 11/2020: LVEF 50-55%. Euvolemic on physical examination. No hospitalizations or urgent care visits since last office encounter. Has gained approximately 6 pounds since last office visit (6 months) secondary to dietary indiscretion. Refill Entresto 24/26 mg p.o. twice daily. Refill Toprol-XL 50 mg p.o. daily. Refill Farxiga 10 mg p.o. daily. In the interim spironolactone 12.5 mg p.o. daily has been discontinued-they do not recall the reason. Reemphasized importance of low-salt diet, increasing physical activity as tolerated with a goal of 30 minutes a day 5 days a week, and secondary prevention. They will send [place] a copy of his most recent labs for review that was performed at PCPs office.  Not available in Care Everywhere. Echo prior to the next visit to re-evaluate LVEF.   RBBB Chronic and stable  Mixed hyperlipidemia Continue pravastatin 40 mg p.o. daily.  Plan of care discussed with the patient and his legal guardian/sister Lawson Fiscal  Orders Placed:  Orders Placed This Encounter  Procedures   EKG 12-Lead   ECHOCARDIOGRAM COMPLETE    Standing Status:   Future    Expected Date:   09/01/2024    Expiration Date:    10/16/2024    Where should this test be performed:   [facility])    Does the patient weigh less than or greater than 250 lbs?:   Patient weighs less than 250 lbs    Perflutren DEFINITY (image enhancing agent) should be administered unless hypersensitivity or allergy exist:   Administer Perflutren    Reason for exam-Echo:   Other-Full Diagnosis List    Full ICD-10/Reason for Exam:   (HFpEF) heart failure with preserved ejection fraction (HCC) [[id number]]   Final Medication List:    Meds ordered this encounter  Medications   dapagliflozin propanediol (FARXIGA) 10 MG TABS tablet    Sig: Take 1 tablet (10 mg total) by mouth daily.    Dispense:  90 tablet    Refill:  3   metoprolol succinate (TOPROL-XL) 50 MG 24 hr tablet    Sig: Take 1 tablet (50 mg total) by mouth in the morning. Hold if top blood pressure number less than 100 mmHg or heart rate less than 60 bpm (pulse).    Dispense:  90 tablet    Refill:  3   sacubitril-valsartan (ENTRESTO) 24-26 MG    Sig: Take 1 tablet by mouth 2 (two) times daily.    Dispense:  180 tablet    Refill:  3    Medications Discontinued During This Encounter  Medication Reason   SODIUM FLUORIDE 5000 PLUS 1.1 % CREA dental cream Patient Preference   tiotropium (SPIRIVA) 18 MCG inhalation capsule Change in therapy   neomycin-bacitracin-polymyxin [id number] OINT    metoprolol succinate (TOPROL-XL) 50 MG 24 hr tablet Reorder   sacubitril-valsartan (ENTRESTO) 24-26 MG Reorder   dapagliflozin propanediol (FARXIGA) 10 MG TABS  tablet Reorder     Current Outpatient Medications:    acetaminophen (TYLENOL) 325 MG tablet, Take 650 mg by mouth every 6 (six) hours as needed., Disp: , Rfl:    albuterol (PROVENTIL) (2.5 MG/3ML) 0.083% nebulizer solution, Take 2.5 mg by nebulization every 6 (six) hours as needed for wheezing or shortness of breath., Disp: , Rfl:    albuterol (VENTOLIN HFA) 108 (90 Base) MCG/ACT inhaler, Inhale 2 puffs into the  lungs every 6 (six) hours as needed for wheezing or shortness of breath., Disp: , Rfl:    carbamide peroxide (DEBROX) 6.5 % OTIC solution, 5 drops daily as needed (ear wax removal)., Disp: , Rfl:    carboxymethylcellulose (REFRESH PLUS) 0.5 % SOLN, 1 drop at bedtime. Gel, Disp: , Rfl:    cetirizine (ZYRTEC) 10 MG tablet, Take 10 mg by mouth daily., Disp: , Rfl:    cloZAPine (CLOZARIL) 100 MG tablet, Take 200 mg by mouth See admin instructions. Take with 50 mg for a total of 250 mg at bedtime, Disp: , Rfl:    clozapine (CLOZARIL) 50 MG tablet, Take 50 mg by mouth See admin instructions. Take with (2) 100 mg for a total of 250 mg at bedtime, Disp: , Rfl:    fluticasone (FLONASE) 50 MCG/ACT nasal spray, Place into both nostrils., Disp: , Rfl:    fluvoxaMINE (LUVOX) 100 MG tablet, Take 300 mg by mouth at bedtime., Disp: , Rfl:    ketoconazole (NIZORAL) 2 % cream, Apply 1 application topically daily., Disp: , Rfl:    MOUNJARO 5 MG/0.5ML Pen, Inject 5 mg into the skin once a week., Disp: , Rfl:    Multiple Vitamin (MULTIVITAMIN) capsule, Take 1 capsule by mouth daily., Disp: 90 capsule, Rfl: 3   Neomycin-Bacitracin-Polymyxin (TRIPLE ANTIBIOTIC) OINT, Apply topically., Disp: , Rfl:    pravastatin (PRAVACHOL) 40 MG tablet, TAKE ONE TABLET EACH DAY, Disp: 90 tablet, Rfl: 3   REFRESH OPTIVE 1-0.9 % GEL, Apply 1 drop to eye as directed., Disp: , Rfl:    sodium chloride (OCEAN) 0.65 % nasal spray, Place 1 spray into the nose as needed for congestion., Disp: , Rfl:    timolol (BETIMOL) 0.5 % ophthalmic solution, Place 1 drop into both eyes 2 (two) times daily., Disp: , Rfl:    TRELEGY ELLIPTA 100-62.5-25 MCG/ACT AEPB, Inhale 1 puff into the lungs daily., Disp: , Rfl:    Wheat Dextrin (BENEFIBER ON THE GO) PACK, Take 1 packet by mouth 3 (three) times daily as needed., Disp: 320 each, Rfl: 11   dapagliflozin propanediol (FARXIGA) 10 MG TABS tablet, Take 1 tablet (10 mg total) by mouth daily., Disp: 90 tablet,  Rfl: 3   metoprolol succinate (TOPROL-XL) 50 MG 24 hr tablet, Take 1 tablet (50 mg total) by mouth in the morning. Hold if top blood pressure number less than 100 mmHg or heart rate less than 60 bpm (pulse)., Disp: 90 tablet, Rfl: 3   sacubitril-valsartan (ENTRESTO) 24-26 MG, Take 1 tablet by mouth 2 (two) times daily., Disp: 180 tablet, Rfl: 3  Consent:   NA  Disposition:   1 year follow-up sooner if needed.  His questions and concerns were addressed to his satisfaction. He voices understanding of the recommendations provided during this encounter.    Signed, Tessa Lerner, DO, [facility]  [facility]  [address] [place], [place] [address] 10/17/2023 3:59 PM

## 2023-10-18 ENCOUNTER — Encounter: Payer: Self-pay | Admitting: Registered Nurse

## 2023-10-23 ENCOUNTER — Ambulatory Visit
Admission: RE | Admit: 2023-10-23 | Discharge: 2023-10-23 | Disposition: A | Discharge status: Home / Self Care | Source: Ambulatory Visit | Attending: Registered Nurse | Admitting: Registered Nurse

## 2023-10-23 DIAGNOSIS — K7460 Unspecified cirrhosis of liver: Secondary | ICD-10-CM

## 2023-10-25 ENCOUNTER — Ambulatory Visit: Payer: 59 | Admitting: Podiatry

## 2023-11-01 ENCOUNTER — Encounter: Payer: Self-pay | Admitting: Podiatry

## 2023-11-01 ENCOUNTER — Ambulatory Visit (INDEPENDENT_AMBULATORY_CARE_PROVIDER_SITE_OTHER): Admitting: Podiatry

## 2023-11-01 VITALS — Ht 74.0 in | Wt 246.0 lb

## 2023-11-01 DIAGNOSIS — L400 Psoriasis vulgaris: Secondary | ICD-10-CM

## 2023-11-01 DIAGNOSIS — M79674 Pain in right toe(s): Secondary | ICD-10-CM | POA: Diagnosis not present

## 2023-11-01 DIAGNOSIS — M79675 Pain in left toe(s): Secondary | ICD-10-CM

## 2023-11-01 DIAGNOSIS — B351 Tinea unguium: Secondary | ICD-10-CM

## 2023-11-01 MED ORDER — BETAMETHASONE DIPROPIONATE 0.05 % EX CREA: TOPICAL_CREAM | Freq: Two times a day (BID) | CUTANEOUS | 0 refills | Status: AC

## 2023-11-01 NOTE — Progress Notes (Signed)
  Subjective:  Patient ID: [name] Branch, male    DOB: 06-10-1966,  MRN: [id number]  Chief Complaint  Patient presents with   RFC    I am here for a nail care and a couple of calloses, I would like a refill of my cream"    58 y.o. male presents with the above complaint. History confirmed with patient.  Returns today for follow-up of his thick and brown discolored toenails, prior debridements have been helpful in reducing pain and improving function. Also presenting with painful hyperkeratotic lesions to both feet to plantar medial first MPJs and left plantar medial heel.  He had been applying Efudex to these from previous visit, significant response. He comes from a facility but is accompanied by family member today.  Objective:  Physical Exam: warm, good capillary refill, normal DP and PT pulses, and bilateral he has multiple dystrophic thickened elongated brown discolored toenails with subungual debris.  Painful hyperkeratotic lesions present to the left plantar medial heel, bilateral medial first MPJ, tender with direct pressure.  The right first MPJ lesion does have pinpoint bleeding upon debridement, the lesions appear plaque-like and lichenified.  Assessment:   1. Plaque psoriasis   2. Pain due to onychomycosis of toenails of both feet       Plan:  Patient was evaluated and treated and all questions answered.  Discussed the etiology and treatment options for the condition in detail with the patient. Prior regular debridements have been helpful for him. Recommended debridement of the nails today. Sharp and mechanical debridement performed of all painful and mycotic nails today x 10. Nails debrided in length and thickness using a nail nipper to level of comfort. Discussed treatment options including appropriate shoe gear.  Can continue with this every 3 months.  The painful lesions x 3 to bilateral first MPJs and left plantar medial heel are sharply debrided using a 312 scalpel  blade.  Pinpoint bleeding noted to the right first MPJ lesion. Had not responded to efudex cream previously. Does have some psoriatic appearance vs verrucous in nature.  Will try betamethasone dipropionate 0.05% cream applied to the lesions twice a day over the next 2 weeks and see if there is any response.  May consider punch biopsies at next visit.    Return in about 2 weeks (around 11/15/2023) for possible punch biopsies.

## 2023-11-19 ENCOUNTER — Ambulatory Visit (INDEPENDENT_AMBULATORY_CARE_PROVIDER_SITE_OTHER): Admitting: Podiatry

## 2023-11-19 DIAGNOSIS — D492 Neoplasm of unspecified behavior of bone, soft tissue, and skin: Secondary | ICD-10-CM | POA: Diagnosis not present

## 2023-11-19 NOTE — Progress Notes (Signed)
  Subjective:  Patient ID: [name] Branch, male    DOB: [birth year],  MRN: [id number]  Chief Complaint  Patient presents with   Foot Pain    Follow up callused lesions bilateral - possible punch biopsies   "They might look some better, but not much"    58 y.o. male presents with the above complaint. History confirmed with patient.  He presents today for follow-up he had seen Dr. Marvis Sluder for skin lesions, has been applying betamethasone  cream with minimal improvement.  Did consider biopsies.  Objective:  Physical Exam: warm, good capillary refill, no trophic changes or ulcerative lesions, normal DP and PT pulses, normal sensory exam, and large verrucous appearing skin lesions bilateral first MTP and medial left heel.  Assessment:   1. Skin neoplasm      Plan:  Patient was evaluated and treated and all questions answered.  Agreed today with skin biopsies.  They were all my schedule today due to a scheduling error, so the patient did not have to return for a separate visit with Dr. Marvis Sluder I agreed to proceed today with the punch biopsies.  Following consent and sterile prep with Betadine local field blocks were performed with 1.5 cc each of 0.5% Marcaine  plain and 1% lidocaine  with epinephrine  on the right first MTP and left heel.  A 4 mm punch biopsy was used to harvest core samples from each site.  Hemostasis was achieved manually and Steri-Strips were applied.  Post care instructions given.  I will see him back in 1 month to reevaluate advised to monitor closely for any signs or symptoms of infection and to notify me if this develops.  Sample was sent to [facility].  Return in about 1 month (around 12/19/2023) for follow up from punch biopsies.

## 2023-11-27 ENCOUNTER — Other Ambulatory Visit: Payer: Self-pay | Admitting: Podiatry

## 2023-12-25 ENCOUNTER — Ambulatory Visit: Admitting: Podiatry

## 2024-03-06 ENCOUNTER — Ambulatory Visit (INDEPENDENT_AMBULATORY_CARE_PROVIDER_SITE_OTHER): Admitting: Podiatry

## 2024-03-06 ENCOUNTER — Encounter: Payer: Self-pay | Admitting: Podiatry

## 2024-03-06 DIAGNOSIS — M79675 Pain in left toe(s): Secondary | ICD-10-CM

## 2024-03-06 DIAGNOSIS — M79674 Pain in right toe(s): Secondary | ICD-10-CM

## 2024-03-06 DIAGNOSIS — B079 Viral wart, unspecified: Secondary | ICD-10-CM | POA: Diagnosis not present

## 2024-03-06 DIAGNOSIS — B351 Tinea unguium: Secondary | ICD-10-CM

## 2024-03-06 DIAGNOSIS — D492 Neoplasm of unspecified behavior of bone, soft tissue, and skin: Secondary | ICD-10-CM

## 2024-03-06 MED ORDER — FLUOROURACIL 5 % EX CREA: TOPICAL_CREAM | Freq: Two times a day (BID) | CUTANEOUS | 1 refills | Status: AC

## 2024-03-06 NOTE — Patient Instructions (Signed)
 Take dressing off in 8 hours and wash the foot with soap and water. If it is hurting or becomes uncomfortable before the 8 hours, go ahead and remove the bandage and wash the area.  If it blisters, apply antibiotic ointment and a band-aid.  Monitor for any signs/symptoms of infection. Call the office immediately if any occur or go directly to the emergency room. Call with any questions/concerns.

## 2024-03-06 NOTE — Progress Notes (Signed)
  Subjective:  Patient ID: [name] Branch, male    DOB: [birth year],  MRN: [id number]  Chief Complaint  Patient presents with   Callouses   Psoriasis    follow up from punch biopsies.    58 y.o. male presents with the above complaint. History confirmed with patient.  Returns today for follow-up of his thick and brown discolored toenails, these do become painful due to their thickness and length with pressure and with shoegear.  He is accompanied by family member who assists in his care today.  He was last seen by Dr. Silva who did end up performing punch biopsies of the hyperkeratotic lesions bilateral first metatarsal head region and left plantar medial heel.  They have not been treating the lesions with anything at this point since the last visit.  Objective:  Physical Exam: warm, good capillary refill, normal DP and PT pulses, and bilateral he has multiple dystrophic thickened elongated brown discolored toenails with subungual debris.  Painful hyperkeratotic lesions present to the left plantar medial heel, bilateral medial first MPJ, tender with direct pressure and lateral pressure.  The right first MPJ lesion does have pinpoint bleeding upon debridement, the lesions appear plaque-like and lichenified. There is petechial bleeding present.  Assessment:   1. Skin neoplasm   2. Pain due to onychomycosis of toenails of both feet       Plan:  Patient was evaluated and treated and all questions answered.  #Onychomycosis with pain  -Nails palliatively debrided as below. -Educated on self-care  Procedure: Nail Debridement Rationale: Pain Type of Debridement: manual, sharp debridement. Instrumentation: Nail nipper, rotary burr. Number of Nails: 10   Reviewed pathology of punch biopsied lesions which were identified as verruca vulgaris. Discussed etiology and treatment of the lesions in detail with the patient as well as multiple treatment options including blistering agents,  chemotherapeutic agents, surgical excision, laser therapy and the indications and roles of the above.  Today, recommended treatment with Cantharone as noted in procedure note below.  Follow-up in 4 weeks for reevaluation.  Prescription for Efudex  cream sent to patient's pharmacy to be applied twice a day under occlusion to the lesions x 3 after any blistering from the Cantharone treatment resolves.  Procedure: Destruction of Lesion x 3 Location: Bilateral plantar medial first MPJ, plantar medial left heel Instrumentation: 15 blade. Technique: Debridement of lesion to petechial bleeding. Aperture pad applied around lesion. Small amount of canthrone applied to the base of the lesion. Dressing: Dry, sterile, compression dressing. Disposition: Patient tolerated procedure well. Advised to leave dressing on for 6-8 hours. Thereafter patient to wash the area with soap and water and applied band-aid. Off-loading pads dispensed. Reappoint in 2 months following continued use of the Effudex or sooner if concerns arise.     Return in about 2 months (around 05/06/2024) for Plantar warts.

## 2024-04-18 ENCOUNTER — Telehealth: Payer: Self-pay | Admitting: Podiatry

## 2024-04-18 NOTE — Telephone Encounter (Signed)
 Is it okay to push the appointment out to October 30th?

## 2024-04-21 NOTE — Telephone Encounter (Signed)
 Called patient and left a message asking for a call back to get rescheduled.

## 2024-04-29 ENCOUNTER — Telehealth: Payer: Self-pay

## 2024-04-29 NOTE — Telephone Encounter (Signed)
   Name: [name] Branch  DOB: [birth year]  MRN: [id number]  Primary Cardiologist: [name], DO   Preoperative team, please contact this patient and set up a phone call appointment for further preoperative risk assessment. Please obtain consent and complete medication review. Thank you for your help.  I confirm that guidance regarding antiplatelet and oral anticoagulation therapy has been completed and, if necessary, noted below.  I also confirmed the patient resides in the state of [place] . As per [facility] Medical Board telemedicine laws, the patient must reside in the state in which the provider is licensed.   Scot Ford, [place] 04/29/2024, 10:42 AM [facility]

## 2024-04-29 NOTE — Telephone Encounter (Signed)
 Left message to call back to schedule tele pre op appt.

## 2024-04-29 NOTE — Telephone Encounter (Signed)
   Pre-operative Risk Assessment    Patient Name: [name] Branch  DOB: 03-27-1966 MRN: [id number]   Date of last office visit: 10/17/23 [name], DO Date of next office visit: NONE  Request for Surgical Clearance    Procedure:  Dental Extraction - Amount of Teeth to be Pulled:  3  TEETH  Date of Surgery:  Clearance TBD                                Surgeon:  DR DEWARD CONGRESS ORAL AND MAXILLOFACIAL SURGEON Surgeon's Group or Practice Name:  [facility] Phone number:  [phone] Fax number:  [phone]   Type of Clearance Requested:   - Medical    Type of Anesthesia:  Local    Additional requests/questions:    Signed, Lucie DELENA Ku   04/29/2024, 10:03 AM

## 2024-04-30 ENCOUNTER — Telehealth: Payer: Self-pay

## 2024-04-30 ENCOUNTER — Ambulatory Visit: Attending: Nurse Practitioner | Admitting: Nurse Practitioner

## 2024-04-30 ENCOUNTER — Encounter: Payer: Self-pay | Admitting: Nurse Practitioner

## 2024-04-30 DIAGNOSIS — Z01810 Encounter for preprocedural cardiovascular examination: Secondary | ICD-10-CM

## 2024-04-30 NOTE — Telephone Encounter (Signed)
  Patient Consent for Virtual Visit         [name] Branch has provided verbal consent on 04/30/2024 for a virtual visit (video or telephone).  Appointment scheduled for 04/30/2024 @ 1pm. Sister stated that this appointment needs to be done ASAP because that patient is in severe pain and severe swelling.    CONSENT FOR VIRTUAL VISIT FOR:  [name] Branch  By participating in this virtual visit I agree to the following:  I hereby voluntarily request, consent and authorize [facility] and its employed or contracted physicians, physician assistants, nurse practitioners or other licensed health care professionals (the Practitioner), to provide me with telemedicine health care services (the "Services) as deemed necessary by the treating Practitioner. I acknowledge and consent to receive the Services by the Practitioner via telemedicine. I understand that the telemedicine visit will involve communicating with the Practitioner through live audiovisual communication technology and the disclosure of certain medical information by electronic transmission. I acknowledge that I have been given the opportunity to request an in-person assessment or other available alternative prior to the telemedicine visit and am voluntarily participating in the telemedicine visit.  I understand that I have the right to withhold or withdraw my consent to the use of telemedicine in the course of my care at any time, without affecting my right to future care or treatment, and that the Practitioner or I may terminate the telemedicine visit at any time. I understand that I have the right to inspect all information obtained and/or recorded in the course of the telemedicine visit and may receive copies of available information for a reasonable fee.  I understand that some of the potential risks of receiving the Services via telemedicine include:  Delay or interruption in medical evaluation due to technological equipment  failure or disruption; Information transmitted may not be sufficient (e.g. poor resolution of images) to allow for appropriate medical decision making by the Practitioner; and/or  In rare instances, security protocols could fail, causing a breach of personal health information.  Furthermore, I acknowledge that it is my responsibility to provide information about my medical history, conditions and care that is complete and accurate to the best of my ability. I acknowledge that Practitioner's advice, recommendations, and/or decision may be based on factors not within their control, such as incomplete or inaccurate data provided by me or distortions of diagnostic images or specimens that may result from electronic transmissions. I understand that the practice of medicine is not an exact science and that Practitioner makes no warranties or guarantees regarding treatment outcomes. I acknowledge that a copy of this consent can be made available to me via my patient portal [facility] MyChart), or I can request a printed copy by calling the office of [facility].    I understand that my insurance will be billed for this visit.   I have read or had this consent read to me. I understand the contents of this consent, which adequately explains the benefits and risks of the Services being provided via telemedicine.  I have been provided ample opportunity to ask questions regarding this consent and the Services and have had my questions answered to my satisfaction. I give my informed consent for the services to be provided through the use of telemedicine in my medical care

## 2024-04-30 NOTE — Telephone Encounter (Signed)
 Appointment scheduled for 04/30/2024 @ 1pm. Sister stated that this appointment needs to be done ASAP because that patient is in severe pain and severe swelling.

## 2024-04-30 NOTE — Progress Notes (Signed)
 Virtual Visit via Telephone Note   Because of [name] Branch co-morbid illnesses, he is at least at moderate risk for complications without adequate follow up.  This format is felt to be most appropriate for this patient at this time.  Due to technical limitations with video connection (technology), today's appointment will be conducted as an audio only telehealth visit, and [name] Branch verbally agreed to proceed in this manner.   All issues noted in this document were discussed and addressed.  No physical exam could be performed with this format.  Evaluation Performed:  Preoperative cardiovascular risk assessment _____________   Date:  04/30/2024   Patient ID:  [name] Branch, DOB 1965-12-12, MRN [id number] Patient Location:  Home Provider location:   Office  Primary Care Provider:  Leontine Cramp, NP Primary Cardiologist:  [name], DO  Chief Complaint / Patient Profile   58 y.o. y/o male with a h/o recovered cardiomyopathy, RBBB, chronic HFpEF, hyperlipidemia, sarcoidosis (not proven with biopsy per sister), schizophrenia, emphysema, intellectual disability, and salivary gland carcinoma who is pending dental extractions and presents today for telephonic preoperative cardiovascular risk assessment. Most recent echo 12/06/2020 revealed near normal LVEF 50-55%, grade 1 diastolic dysfunction, mild AI, mild TR.  History of Present Illness    [name] Branch is a 58 y.o. male who presents via audio/video conferencing for a telehealth visit today.  Pt was last seen in cardiology clinic on 10/17/23 by Dr. Large.  At that time [name] Branch was doing well.  The patient is now pending procedure as outlined above. Since his last visit, his caregiver states the patient has not had chest pain, edema, fatigue, palpitations, melena, hematuria, hemoptysis, diaphoresis, weakness, presyncope, syncope. His activities are limited to walking around the house and taking care of his room and ADLs. He has  chronic shortness of breath that she feels is stable. No orthopnea, PND, or edema. He is able to achieve > 4 METS activity without concerning cardiac symptoms.    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  Qualifier: Diagnosis of  By: Donavan MD, Yogesh     Obstruction of colon (HCC)    recurrent   Sarcoidosis    Schizophrenia (HCC)    Sleep apnea    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   Total self-care deficit    per caregiver he needs to have help bathing and cleaning self and needs help with all daily needs now    Past Surgical History:  Procedure Laterality Date   BALLOON DILATION N/A 07/26/2021   Procedure: BALLOON DILATION;  Surgeon: Shila Gustav GAILS, MD;  Location: [facility];  Service: Endoscopy;  Laterality: N/A;   BIOPSY  07/26/2021   Procedure: BIOPSY;  Surgeon: Shila Gustav GAILS, MD;  Location: [facility];  Service: Endoscopy;;   COLECTOMY N/A 09/25/2014   Procedure: OPEN  ABDOMINAL COLECTOMY;  Surgeon: Elspeth Schultze, MD;  Location: [facility];  Service: General;  Laterality: N/A;   COLONOSCOPY  2012   FLEXIBLE SIGMOIDOSCOPY N/A 07/26/2021   Procedure: FLEXIBLE SIGMOIDOSCOPY;  Surgeon: Shila Gustav GAILS, MD;  Location: [facility];  Service: Endoscopy;  Laterality: N/A;   groin surgery     boil drained   MOUTH SURGERY     PROCTOSCOPY N/A 09/25/2014   Procedure: RIGID PROCTOSCOPY;  Surgeon: Elspeth Schultze, MD;  Location: [facility];  Service:  General;  Laterality: N/A;    Allergies  Allergies  Allergen Reactions   Hydrocodone  Other (See Comments)    Exacerbation of urinary and fecal incontinence    Home Medications    Prior to Admission medications   Medication Sig Start Date End Date Taking? Authorizing Provider  acetaminophen  (TYLENOL ) 325 MG tablet Take 650 mg by mouth every 6 (six) hours as needed.    [provider]  albuterol (PROVENTIL) (2.5 MG/3ML) 0.083% nebulizer solution Take 2.5 mg by nebulization every 6 (six) hours as needed for wheezing or shortness of breath.    [provider]  albuterol (VENTOLIN HFA) 108 (90 Base) MCG/ACT inhaler Inhale 2 puffs into the lungs every 6 (six) hours as needed for wheezing or shortness of breath.    [provider]  amoxicillin (AMOXIL) 500 MG capsule Take 500 mg by mouth 3 (three) times daily. 04/28/24   [provider]  azelastine (ASTELIN) 0.1 % nasal spray Place 1 spray into both nostrils 2 (two) times daily. 10/17/23   [provider]  carbamide peroxide (DEBROX) 6.5 % OTIC solution 5 drops daily as needed (ear wax removal).    [provider]  carboxymethylcellulose (REFRESH PLUS) 0.5 % SOLN 1 drop at bedtime. Gel    [provider]  cetirizine (ZYRTEC) 10 MG tablet Take 10 mg by mouth daily. 08/30/20   [provider]  cloZAPine  (CLOZARIL ) 100 MG tablet Take 200 mg by mouth See admin instructions. Take with 50 mg for a total of 250 mg at bedtime    [provider]  clozapine  (CLOZARIL ) 50 MG tablet Take 50 mg by mouth See admin instructions. Take with (2) 100 mg for a total of 250 mg at bedtime    [provider]  dapagliflozin  propanediol (FARXIGA ) 10 MG TABS tablet Take 1 tablet (10 mg total) by mouth daily. 10/17/23   Tolia, Sunit, DO  fluorouracil  (EFUDEX ) 5 % cream Apply topically 2 (two) times daily. 03/06/24 05/05/24  Lamount Ethan CROME, DPM  fluticasone (FLONASE) 50 MCG/ACT nasal spray  Place into both nostrils. 08/31/21   [provider]  fluvoxaMINE  (LUVOX ) 100 MG tablet Take 300 mg by mouth at bedtime. 11/30/20   [provider]  ketoconazole  (NIZORAL ) 2 % cream Apply 1 application topically daily.    [provider]  metoprolol  succinate (TOPROL -XL) 50 MG 24 hr tablet Take 1 tablet (50 mg total) by mouth in the morning. Hold if top blood pressure number less than 100 mmHg or heart rate less than 60 bpm (pulse). 10/17/23 01/15/24  Tolia, Sunit, DO  MOUNJARO 5 MG/0.5ML Pen Inject 5 mg into the skin once a week. 11/01/22   [provider]  Multiple Vitamin (MULTIVITAMIN) capsule Take 1 capsule by mouth daily. 03/25/18   Vicci Barnie NOVAK, MD  Multiple Vitamins-Minerals (CERTAVITE SENIOR/ANTIOXIDANT) TABS Take by mouth 1 day or 1 dose. 10/17/23   [provider]  Neomycin-Bacitracin-Polymyxin (TRIPLE ANTIBIOTIC) OINT Apply topically.  [provider]  pravastatin  (PRAVACHOL ) 40 MG tablet TAKE ONE TABLET EACH DAY 05/31/21   Tolia, Sunit, DO  REFRESH OPTIVE 1-0.9 % GEL Apply 1 drop to eye as directed. 09/13/21   [provider]  sacubitril-valsartan (ENTRESTO ) 24-26 MG Take 1 tablet by mouth 2 (two) times daily. 10/17/23   Tolia, Sunit, DO  sodium chloride  (OCEAN) 0.65 % nasal spray Place 1 spray into the nose as needed for congestion.    [provider]  timolol (TIMOPTIC) 0.5 % ophthalmic solution SMARTSIG:In Eye(s) 11/16/23   [provider]  TRELEGY ELLIPTA 100-62.5-25 MCG/ACT AEPB Inhale 1 puff into the lungs daily. 09/19/23   [provider]  Wheat Dextrin (BENEFIBER ON THE GO) PACK Take 1 packet by mouth 3 (three) times daily as needed. 10/31/21   Beather Delon Gibson, PA  spironolactone  (ALDACTONE ) 25 MG tablet Take 0.5 tablets (12.5 mg total) by mouth in the morning. 09/02/20 09/25/20  Michele Richardson, DO    Physical Exam    Vital Signs:  Aahil Fredin does not have vital signs available for review  today.  Given telephonic nature of communication, physical exam is limited. AAOx3. NAD. Normal affect.  Speech and respirations are unlabored.  Accessory Clinical Findings    None  Assessment & Plan    1.  Preoperative Cardiovascular Risk Assessment: According to the Revised Cardiac Risk Index (RCRI), his Perioperative Risk of Major Cardiac Event is (%): 0.9. His Functional Capacity in METs is: 4.4 according to the Duke Activity Status Index (DASI). The patient is doing well from a cardiac perspective. Therefore, based on ACC/AHA guidelines, the patient would be at acceptable risk for the planned procedure without further cardiovascular testing.   The patient was advised that if he develops new symptoms prior to surgery to contact our office to arrange for a follow-up visit, and he verbalized understanding.  SBE prophylaxis is not needed prior to dental procedure. No request to hold cardiac medications.   A copy of this note will be routed to requesting surgeon.  Time:   Today, I have spent 10 minutes with the patient with telehealth technology discussing medical history, symptoms, and management plan.     Rosaline EMERSON Bane, NP-C  04/30/2024, 1:06 PM [address], [address] [place], [place] [address] Office [phone] Fax [phone]

## 2024-05-08 ENCOUNTER — Ambulatory Visit: Admitting: Podiatry

## 2024-05-22 ENCOUNTER — Ambulatory Visit (INDEPENDENT_AMBULATORY_CARE_PROVIDER_SITE_OTHER): Admitting: Podiatry

## 2024-05-22 DIAGNOSIS — B351 Tinea unguium: Secondary | ICD-10-CM | POA: Diagnosis not present

## 2024-05-22 DIAGNOSIS — B079 Viral wart, unspecified: Secondary | ICD-10-CM

## 2024-05-22 DIAGNOSIS — M79675 Pain in left toe(s): Secondary | ICD-10-CM

## 2024-05-22 DIAGNOSIS — M79674 Pain in right toe(s): Secondary | ICD-10-CM | POA: Diagnosis not present

## 2024-05-22 MED ORDER — FLUOROURACIL 5 % EX CREA: TOPICAL_CREAM | Freq: Two times a day (BID) | CUTANEOUS | 0 refills | Status: DC

## 2024-05-22 NOTE — Progress Notes (Signed)
  Subjective:  Patient ID: [name] Branch, male    DOB: 08/08/1965,  MRN: [id number]  Chief Complaint  Patient presents with   Plantar Warts    Plantar warts pt states no pain from warts. A1c 6.0  81 mg Asprin    58 y.o. male presents with the above complaint. History confirmed with patient.  Returns today for follow-up of his thick and brown discolored toenails, these do become painful due to their thickness and length with pressure and with shoegear.  He is accompanied by family member who assists in his care today.  He is also here for verruca vulgaris lesions present bilateral first MPJs, left plantar medial heel.  They have been using the Efudex  cream.  They do report some slight improvement in this however the lesions do have significant size and buildup.  Objective:  Physical Exam: warm, good capillary refill, normal DP and PT pulses.  Nail plates x 10 bilaterally are thickened, elongated, dystrophic with yellow discoloration and subungual debris.  They are tender on direct dorsal palpation x 10.  Painful hyperkeratotic lesions present to the left plantar medial heel, bilateral medial first MPJ, tender with direct pressure and lateral pressure.  Petechial bleeding present upon paring down.  Slight decrease in size from previous.  Assessment:   1. Verrucae vulgaris   2. Pain due to onychomycosis of toenails of both feet       Plan:  Patient was evaluated and treated and all questions answered.  #Onychomycosis with pain  -Nails palliatively debrided as below. -Educated on self-care  Procedure: Nail Debridement Rationale: Pain Type of Debridement: manual, sharp debridement. Instrumentation: Nail nipper, rotary burr. Number of Nails: 10   Discussed etiology and treatment of verruca vulgaris in detail with the patient as well as multiple treatment options including blistering agents, chemotherapeutic agents, surgical excision, laser therapy and the indications and roles of the  above.  Today, recommended a second treatment with Cantharone as noted in procedure note below.  The patient does, facility.  Frequent follow-ups are difficult for them, they will proceed with approximately 80-month follow-up.  Refilled course of topical Efudex .  Reviewed application instructions.  If there is not significant improvement to the lesions, may need to have more frequent follow-up or discuss other treatment options.  Procedure: Destruction of Lesion x 3 Location: Bilateral plantar medial first MPJ, left plantar medial heel. Instrumentation: 15 blade. Technique: Debridement of lesions x 3 to petechial bleeding. Aperture pad applied around lesion. Small amount of canthrone applied to the base of the lesion. Dressing: Dry, sterile, compression dressing. Disposition: Patient tolerated procedure well. Advised to leave dressing on for 6-8 hours. Thereafter patient to wash the area with soap and water and applied band-aid. Off-loading pads dispensed. Patient to return in approximately 2 months      Return in about 2 months (around 07/22/2024) for Wart Check.

## 2024-05-22 NOTE — Patient Instructions (Signed)
 Take dressing off in 8 hours and wash the foot with soap and water. If it is hurting or becomes uncomfortable before the 8 hours, go ahead and remove the bandage and wash the area.  If it blisters, apply antibiotic ointment and a band-aid.  Monitor for any signs/symptoms of infection. Call the office immediately if any occur or go directly to the emergency room. Call with any questions/concerns.

## 2024-05-26 ENCOUNTER — Encounter: Payer: Self-pay | Admitting: Podiatry

## 2024-06-24 ENCOUNTER — Other Ambulatory Visit: Payer: Self-pay

## 2024-06-24 ENCOUNTER — Other Ambulatory Visit: Payer: Self-pay | Admitting: *Deleted

## 2024-06-24 DIAGNOSIS — C050 Malignant neoplasm of hard palate: Secondary | ICD-10-CM

## 2024-06-24 DIAGNOSIS — C069 Malignant neoplasm of mouth, unspecified: Secondary | ICD-10-CM

## 2024-06-25 ENCOUNTER — Ambulatory Visit: Payer: 59 | Admitting: Nurse Practitioner

## 2024-06-25 ENCOUNTER — Inpatient Hospital Stay

## 2024-06-25 ENCOUNTER — Inpatient Hospital Stay: Admitting: Hematology

## 2024-06-25 ENCOUNTER — Other Ambulatory Visit: Payer: 59

## 2024-06-29 NOTE — Progress Notes (Unsigned)
 CLINIC:  Survivorship  Patient Care Team: Leontine Cramp, NP as PCP - General (Nurse Practitioner) Michele Richardson, DO as PCP - Cardiology (Cardiology) Obie Princella HERO, MD (Inactive) as Consulting Physician (Gastroenterology) Malmfelt, Delon CROME, RN as Oncology Nurse Navigator [name] Domino, MD as Attending Physician (Radiation Oncology) Jacelyn Lupita NOVAK, CCC-SLP as Speech Language Pathologist (Speech Pathology) Lanis Carbon, Florina CROME, PT as Physical Therapist (Physical Therapy) Daryle Heron CROME, RD as Dietitian (Nutrition) Hechler, Morene CROME, MD (Inactive) as Referring Physician (Surgical Oncology) Josetta Wigal K, NP as Nurse Practitioner (Nurse Practitioner)   REASON FOR VISIT:  Routine follow-up for history of head & neck cancer.  BRIEF ONCOLOGIC HISTORY:  Oncology History  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HCC)  04/28/2020 Cancer Staging   Staging form: Oral Cavity, AJCC 8th Edition - Pathologic stage from 04/28/2020: Stage Unknown (pT2, pNX, cM0) - Signed by [name] Domino, MD on 05/03/2020   05/03/2020 Initial Diagnosis   Cancer of hard palate [facility])      INTERVAL HISTORY: [name] Branch returns for follow up as scheduled. Last seen by me 06/25/23.  ***  -Pain:  -Nutrition/Diet:  -Dysphagia?:  -Dental issues?: using fluoride  trays?  -Last TSH:  -Weight: (LOSS/GAIN) since (last visit)   -Last ENT visit:   -Last Rad Onc visit:  -Last Dentist visit:     ADDITIONAL REVIEW OF SYSTEMS:  ROS    CURRENT MEDICATIONS:  Current Outpatient Medications on File Prior to Visit  Medication Sig Dispense Refill   acetaminophen  (TYLENOL ) 325 MG tablet Take 650 mg by mouth every 6 (six) hours as needed.     albuterol (PROVENTIL) (2.5 MG/3ML) 0.083% nebulizer solution Take 2.5 mg by nebulization every 6 (six) hours as needed for wheezing or shortness of breath.     albuterol (VENTOLIN HFA) 108 (90 Base) MCG/ACT inhaler Inhale 2 puffs into the lungs every 6 (six) hours as needed for wheezing  or shortness of breath.     amoxicillin (AMOXIL) 500 MG capsule Take 500 mg by mouth 3 (three) times daily.     azelastine (ASTELIN) 0.1 % nasal spray Place 1 spray into both nostrils 2 (two) times daily.     carbamide peroxide (DEBROX) 6.5 % OTIC solution 5 drops daily as needed (ear wax removal).     carboxymethylcellulose (REFRESH PLUS) 0.5 % SOLN 1 drop at bedtime. Gel     cetirizine (ZYRTEC) 10 MG tablet Take 10 mg by mouth daily.     cloZAPine  (CLOZARIL ) 100 MG tablet Take 200 mg by mouth See admin instructions. Take with 50 mg for a total of 250 mg at bedtime     clozapine  (CLOZARIL ) 50 MG tablet Take 50 mg by mouth See admin instructions. Take with (2) 100 mg for a total of 250 mg at bedtime     dapagliflozin  propanediol (FARXIGA ) 10 MG TABS tablet Take 1 tablet (10 mg total) by mouth daily. 90 tablet 3   fluorouracil  (EFUDEX ) 5 % cream Apply topically 2 (two) times daily. 60 g 0   fluticasone (FLONASE) 50 MCG/ACT nasal spray Place into both nostrils.     fluvoxaMINE  (LUVOX ) 100 MG tablet Take 300 mg by mouth at bedtime.     ketoconazole  (NIZORAL ) 2 % cream Apply 1 application topically daily.     metoprolol  succinate (TOPROL -XL) 50 MG 24 hr tablet Take 1 tablet (50 mg total) by mouth in the morning. Hold if top blood pressure number less than 100 mmHg or heart rate less than 60 bpm (pulse). 90  tablet 3   MOUNJARO 5 MG/0.5ML Pen Inject 5 mg into the skin once a week.     Multiple Vitamin (MULTIVITAMIN) capsule Take 1 capsule by mouth daily. 90 capsule 3   Multiple Vitamins-Minerals (CERTAVITE SENIOR/ANTIOXIDANT) TABS Take by mouth 1 day or 1 dose.     Neomycin-Bacitracin-Polymyxin (TRIPLE ANTIBIOTIC) OINT Apply topically.     pravastatin  (PRAVACHOL ) 40 MG tablet TAKE ONE TABLET EACH DAY 90 tablet 3   REFRESH OPTIVE 1-0.9 % GEL Apply 1 drop to eye as directed.     sacubitril-valsartan (ENTRESTO ) 24-26 MG Take 1 tablet by mouth 2 (two) times daily. 180 tablet 3   sodium chloride  (OCEAN)  0.65 % nasal spray Place 1 spray into the nose as needed for congestion.     timolol (TIMOPTIC) 0.5 % ophthalmic solution SMARTSIG:In Eye(s)     TRELEGY ELLIPTA 100-62.5-25 MCG/ACT AEPB Inhale 1 puff into the lungs daily.     Wheat Dextrin (BENEFIBER ON THE GO) PACK Take 1 packet by mouth 3 (three) times daily as needed. 320 each 11   [DISCONTINUED] spironolactone  (ALDACTONE ) 25 MG tablet Take 0.5 tablets (12.5 mg total) by mouth in the morning. 45 tablet 0   No current facility-administered medications on file prior to visit.    ALLERGIES:  Allergies  Allergen Reactions   Hydrocodone  Other (See Comments)    Exacerbation of urinary and fecal incontinence     PHYSICAL EXAM:  There were no vitals filed for this visit. There were no vitals filed for this visit.  Weight Date                     Pre-treatment (RT consult date):    General: Well-nourished, well-appearing male/male*** in no acute distress.  Accompanied/Unaccompanied today.  HEENT: Head is atraumatic and normocephalic.  Pupils equal and reactive to light. Conjunctivae clear without exudate.  Sclerae anicteric. Oral mucosa is pink and moist without lesions.  Tongue pink, moist, and midline. Oropharynx is pink and moist, without lesions. Lymph: No preauricular, postauricular, cervical, supraclavicular, or infraclavicular lymphadenopathy noted on palpation.   Neck: No palpable masses. Skin on neck is ***.  Cardiovascular: Normal rate and rhythm. Respiratory: Clear to auscultation bilaterally. Chest expansion symmetric without accessory muscle use; breathing non-labored.  GI: Abdomen soft and round. Non-tender, non-distended. Bowel sounds normoactive.  GU: Deferred.   Neuro: No focal deficits. Steady gait.   Psych: Normal mood and affect for situation. Extremities: No edema.  Skin: Warm and dry.    LABORATORY DATA:  None at this visit.***  DIAGNOSTIC IMAGING:  None at this visit.    ASSESSMENT & PLAN:  Mr.  [name] Branch is a pleasant 58 y.o. male/male*** with history of ***, diagnosed in ***(date);  treated with ***; completed treatment on (date).  Patient presents to survivorship clinic today for routine follow-up after finishing treatment.   1. Cancer***:  [name] is clinically without evidence of disease or recurrence on physical exam today.     #. Nutritional status: [name] reports that he is currently able to consume adequate nutrition by mouth/via tube***.  Weight is stable at *** lbs today.  Encouraged to continue to consume adequate hydration and nutrition, as tolerated.    #. At risk for dysphagia: Given [name] treatment for *** cancer, which included surgery and radiation therapy***, he is at risk for chronic dysphagia.  he reports having difficulty with breads and meats, but is able to consume soft foods and liquids without difficulty.  I encouraged him to continue to perform the swallowing exercises, as directed by Lupita [name] Branch, SLP.  If Mr. Keast requires further swallowing or speech therapy evaluation, I will be happy to place that referral, if needed.  Currently, the patient's reported swallowing concerns are to be expected and stable.    #.  At risk for neck lymphedema:  When patients with head & neck cancers are treated with surgery and/or radiation therapy, there is an associated increased risk of neck lymphedema.  [name] reports that currently he is experiencing what would be considered mild symptoms. he does/does not*** have a neck compression garment and reports wearing/not wearing*** it, as directed.  I encouraged [name] to continue to wear the compression garment and practice the massage techniques to reduce the presence of lymphedema.  If his symptoms worsen, I would happy to place a formal referral to physical therapy for further evaluation and treatment.     #.  At risk for hypothyroidism: The thyroid  gland is often affected after treatment for  head & neck cancer.  [name] most recent TSH was normal at *** on (date).  If appropriate, he will be prescribed thyroid  supplement with Levothyroxine. We discussed that he will continue to have serial TSH monitoring for at least the next 5 years as part of his routine follow-up and post-cancer treatment care.   #. At risk for tooth decay/dental concerns: After treatment with radiation for head & neck cancers, patients often experience xerostomia which increases their risk of dental caries. [name] was encouraged to see his dentist 3-4 times per year. The patient should also continue to use his fluoride  trays daily, as directed by dentistry.  I encouraged him to reach out to either Dr. Cyndee (oncology dentist) or his primary care dentist with additional questions or concerns.   #.  Lung cancer screening:  [facility] now offers eligible patients lung cancer screening with a low-dose chest CT to aid in early detection, provide more effective treatment options, and ultimately improve survival benefits for patients diagnosed early.  Below is the selection criteria for screening:  Medicare patients: 55-77 years; privately insured patients 55-80 years. Active or former smokers who have quit within the last 15 years. 30+ pack-year history of smoking  Exclusion criteria - No signs/symptoms of lung cancer (i.e., no recent history of hemoptysis and no unexplained weight loss >15 pounds in the last 6 months). Willing and healthy enough to undergo biopsies/surgery if needed.  [name] currently does/does not*** meet criteria for lung cancer screening.  Therefore, I have/have not*** placed a referral for screening today.    #. Tobacco & alcohol use: [name] reports that he quit smoking in *** and continues to abstain from all tobacco products.  I congratulated his continued efforts to remain tobacco free.  I also reinforced the importance of avoiding alcohol consumption as well.  Both  tobacco and alcohol use in patients with head & neck cancer increases the risk of recurrence.  They also increase the risk of other cancers, as well.  [name] states that he voiced understanding of the importance of continuing to remain both tobacco and alcohol-free.    #. Health maintenance and wellness promotion: Cancer patients who consume a diet rich in fruits and vegetables have better overall health and decreased risk of cancer recurrence. [name] was encouraged to consume 5-7 servings of fruits and vegetables per day, as tolerated. [name] was also encouraged to engage in moderate to vigorous  exercise for 30 minutes per day most days of the week.   #. Support services/counseling: It is not uncommon for this period of the patient's cancer care trajectory to be one of many emotions and stressors.  We discussed an opportunity for him to participate in the next session of Head & Neck FYNN (Finding Your New Normal) support group series, designed for patients after they have completed treatment.   [name] was encouraged to take advantage of our many other support services programs, support groups, and/or counseling in coping with his new life as a cancer survivor after completing anti-cancer treatment. The patient was offered support today through active listening and expressive supportive counseling.     Dispo:  -See Dr. PIERRETTE (ENT) in ***/2017 -Return to cancer center to see Dr. Izell in ***/2017 -Return to cancer center to see Survivorship NP in ***.      A total of *** minutes was spent in the face-to-face care of this patient, with greater than 50% of that time spent in counseling and care-coordination.    Le Ferraz, NP Survivorship Program [facility] [phone]

## 2024-06-30 ENCOUNTER — Inpatient Hospital Stay: Admitting: Nurse Practitioner

## 2024-06-30 ENCOUNTER — Encounter: Payer: Self-pay | Admitting: Nurse Practitioner

## 2024-06-30 ENCOUNTER — Inpatient Hospital Stay: Attending: Nurse Practitioner

## 2024-06-30 VITALS — BP 130/64 | HR 102 | Temp 97.6°F | Resp 17 | Wt 239.6 lb

## 2024-06-30 DIAGNOSIS — C069 Malignant neoplasm of mouth, unspecified: Secondary | ICD-10-CM

## 2024-06-30 DIAGNOSIS — C050 Malignant neoplasm of hard palate: Secondary | ICD-10-CM

## 2024-06-30 DIAGNOSIS — Z7984 Long term (current) use of oral hypoglycemic drugs: Secondary | ICD-10-CM | POA: Insufficient documentation

## 2024-06-30 DIAGNOSIS — Z79899 Other long term (current) drug therapy: Secondary | ICD-10-CM | POA: Insufficient documentation

## 2024-06-30 LAB — CMP (CANCER CENTER ONLY)
ALT: 56 U/L — ABNORMAL HIGH (ref 0–44)
AST: 42 U/L — ABNORMAL HIGH (ref 15–41)
Albumin: 4.2 g/dL (ref 3.5–5.0)
Alkaline Phosphatase: 116 U/L (ref 38–126)
Anion gap: 11 (ref 5–15)
BUN: 15 mg/dL (ref 6–20)
CO2: 22 mmol/L (ref 22–32)
Calcium: 9.7 mg/dL (ref 8.9–10.3)
Chloride: 106 mmol/L (ref 98–111)
Creatinine: 1.38 mg/dL — ABNORMAL HIGH (ref 0.61–1.24)
GFR, Estimated: 60 mL/min — ABNORMAL LOW (ref >60)
Glucose, Bld: 120 mg/dL — ABNORMAL HIGH (ref 70–99)
Potassium: 4.6 mmol/L (ref 3.5–5.1)
Sodium: 140 mmol/L (ref 135–145)
Total Bilirubin: 0.4 mg/dL (ref 0.0–1.2)
Total Protein: 7.7 g/dL (ref 6.5–8.1)

## 2024-06-30 LAB — CBC WITH DIFFERENTIAL (CANCER CENTER ONLY)
Abs Immature Granulocytes: 0.02 K/uL (ref 0.00–0.07)
Basophils Absolute: 0 K/uL (ref 0.0–0.1)
Basophils Relative: 1 %
Eosinophils Absolute: 0.1 K/uL (ref 0.0–0.5)
Eosinophils Relative: 1 %
HCT: 50.5 % (ref 39.0–52.0)
Hemoglobin: 16.3 g/dL (ref 13.0–17.0)
Immature Granulocytes: 0 %
Lymphocytes Relative: 28 %
Lymphs Abs: 1.5 K/uL (ref 0.7–4.0)
MCH: 30.1 pg (ref 26.0–34.0)
MCHC: 32.3 g/dL (ref 30.0–36.0)
MCV: 93.2 fL (ref 80.0–100.0)
Monocytes Absolute: 0.5 K/uL (ref 0.1–1.0)
Monocytes Relative: 10 %
Neutro Abs: 3.2 K/uL (ref 1.7–7.7)
Neutrophils Relative %: 60 %
Platelet Count: 141 K/uL — ABNORMAL LOW (ref 150–400)
RBC: 5.42 MIL/uL (ref 4.22–5.81)
RDW: 14.7 % (ref 11.5–15.5)
WBC Count: 5.3 K/uL (ref 4.0–10.5)
nRBC: 0 % (ref 0.0–0.2)

## 2024-06-30 LAB — TSH: TSH: 1.34 u[IU]/mL (ref 0.350–4.500)

## 2024-07-01 ENCOUNTER — Ambulatory Visit: Payer: Self-pay | Admitting: Nurse Practitioner

## 2024-07-02 NOTE — Telephone Encounter (Addendum)
 As per [name] NP called pt to rely results below no one answered the phone so left detailed message on results below.left message to call back about any questions about results below   ----- Message from [name] Branch sent at 07/01/2024 11:20 AM EST ----- [name] Branch,  Please let pt's sister know TSH is normal. I'm attaching PCP for abnormal liver and renal function f/up.   Thanks Lacie NP ----- Message ----- From: Rebecka, [facility] [place] Sent: 06/30/2024   9:52 AM EST To: Onita Mattock, MD

## 2024-07-17 ENCOUNTER — Encounter: Payer: Self-pay | Admitting: Podiatry

## 2024-07-17 ENCOUNTER — Ambulatory Visit: Admitting: Podiatry

## 2024-07-17 DIAGNOSIS — B079 Viral wart, unspecified: Secondary | ICD-10-CM

## 2024-07-17 MED ORDER — FLUOROURACIL 5 % EX CREA: TOPICAL_CREAM | Freq: Two times a day (BID) | CUTANEOUS | 2 refills | Status: AC

## 2024-07-17 NOTE — Progress Notes (Signed)
 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  Pt has been applying cream and feel they have improved. Diabetic A1c 6.0,     HPI: 58 y.o. male presents today accompanied by sister following up for verruca plantaris lesions bilateral medial first MPJ and left plantar medial heel.  The metatarsal head lesions do appear somewhat improved with the prior treatments and the Efudex .  Minimal change noted medial heel.  The lesions are still significant in size.  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  Qualifier: Diagnosis of  By: Donavan MD, Yogesh     Obstruction of colon (HCC)    recurrent   Sarcoidosis    Schizophrenia (HCC)    Sleep apnea    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   Total self-care deficit    per caregiver he needs to have help bathing and cleaning self and needs help with all daily needs now     Past Surgical History:  Procedure Laterality Date   BALLOON DILATION N/A 07/26/2021   Procedure: BALLOON DILATION;  Surgeon: Shila Gustav GAILS, MD;  Location: [facility];  Service: Endoscopy;  Laterality: N/A;   BIOPSY  07/26/2021   Procedure: BIOPSY;  Surgeon: Shila Gustav GAILS, MD;  Location: [facility];  Service: Endoscopy;;   COLECTOMY N/A 09/25/2014   Procedure: OPEN ABDOMINAL COLECTOMY;  Surgeon: Elspeth Schultze, MD;  Location: [facility];  Service: General;  Laterality: N/A;   COLONOSCOPY  2012    FLEXIBLE SIGMOIDOSCOPY N/A 07/26/2021   Procedure: FLEXIBLE SIGMOIDOSCOPY;  Surgeon: Shila Gustav GAILS, MD;  Location: [facility];  Service: Endoscopy;  Laterality: N/A;   groin surgery     boil drained   MOUTH SURGERY     PROCTOSCOPY N/A 09/25/2014   Procedure: RIGID PROCTOSCOPY;  Surgeon: Elspeth Schultze, MD;  Location: [facility];  Service: General;  Laterality: N/A;    Allergies[1]  ROS    Physical Exam: There were no vitals filed for this visit.  General: The patient is alert and oriented x3 in no acute distress.  Dermatology: Hyperkeratotic skin lesions noted medial first metatarsal heads bilaterally and plantar medial left heel noted.  Tender on direct and lateral palpation.  Sharply debrided to petechial bleeding.  Disruption of skin tension lines.  Nailplates mycotic in appearance.  Vascular: Palpable pedal pulses bilaterally. Capillary refill within normal limits.  No appreciable edema.  No erythema or calor.  Neurological: Light touch sensation grossly intact bilateral feet.   Musculoskeletal Exam: No pedal deformities noted   Assessment/Plan of Care: 1. Verrucae vulgaris      Meds ordered this encounter  Medications   fluorouracil  (EFUDEX ) 5 % cream    Sig: Apply topically 2 (two) times daily.    Dispense:  60 g  Refill:  2   None  Discussed clinical findings with patient today.  Discussed etiology and treatment of verruca plantaris in detail with the patient as well as multiple treatment options including blistering agents, chemotherapeutic agents, surgical excision, laser therapy and the indications and roles of the above.  Today, recommended treatment with Cantharone as noted in procedure note below.  Refill of the Efudex  cream sent to patient's pharmacy.  Follow-up in 2 months for reevaluation as patient needs to leave facility is difficult for him to make appointments with sister.  Lesions have shown some response.  They are still significant in size.  We  did discuss laser destruction of the lesions in the OR.  Sister is interested in proceeding with this if we do not get good resolution going forward as the lesions have been present for some time.  Procedure: Destruction of Lesion x 3 Location: Medial first met head bilaterally, left plantar medial heel Instrumentation: 15 blade. Technique: Debridement of lesion to petechial bleeding. Aperture pad applied around lesion. Small amount of canthrone applied to the base of the lesion. Dressing: Dry, sterile, compression dressing. Disposition: Patient tolerated procedure well. Advised to leave dressing on for 6-8 hours. Thereafter patient to wash the area with soap and water and applied band-aid. Off-loading pads dispensed. Patient to return in 2 months   Chasen Mendell L. Lamount MAUL, AACFAS [facility]     [address] [place], [place] [address]                Office [phone]  Fax [phone]       [1]  Allergies Allergen Reactions   Hydrocodone  Other (See Comments)    Exacerbation of urinary and fecal incontinence

## 2024-07-17 NOTE — Patient Instructions (Signed)
 Take dressing off in 8 hours and wash the foot with soap and water. If it is hurting or becomes uncomfortable before the 8 hours, go ahead and remove the bandage and wash the area.  If it blisters, apply antibiotic ointment and a band-aid.  Monitor for any signs/symptoms of infection. Call the office immediately if any occur or go directly to the emergency room. Call with any questions/concerns.

## 2024-08-07 ENCOUNTER — Ambulatory Visit: Admitting: Podiatry

## 2024-08-07 DIAGNOSIS — B079 Viral wart, unspecified: Secondary | ICD-10-CM | POA: Diagnosis not present

## 2024-08-07 NOTE — Progress Notes (Unsigned)
 B/l mpj lesions look quite a bit better, left heel large but improving  Cantharadin  We did discuss surgery today.  Guardian is unsure if he would be able to take a week off of his programs during time of waiting on first dressing.  They will consider this going forward in the meantime continue follow-up in about 3 weeks as this did respond to more frequent catheter and treatment.

## 2024-08-07 NOTE — Patient Instructions (Signed)
 Take dressing off in 8 hours and wash the foot with soap and water. If it is hurting or becomes uncomfortable before the 8 hours, go ahead and remove the bandage and wash the area.  If it blisters, apply antibiotic ointment and a band-aid.  Monitor for any signs/symptoms of infection. Call the office immediately if any occur or go directly to the emergency room. Call with any questions/concerns.

## 2024-08-11 ENCOUNTER — Encounter: Payer: Self-pay | Admitting: Podiatry

## 2024-09-03 ENCOUNTER — Encounter (HOSPITAL_COMMUNITY): Payer: Self-pay | Admitting: Cardiology

## 2024-09-03 ENCOUNTER — Ambulatory Visit (HOSPITAL_COMMUNITY)

## 2024-09-08 ENCOUNTER — Ambulatory Visit: Admitting: Podiatry

## 2024-10-07 ENCOUNTER — Ambulatory Visit (HOSPITAL_COMMUNITY)

## 2025-07-07 ENCOUNTER — Inpatient Hospital Stay

## 2025-07-07 ENCOUNTER — Inpatient Hospital Stay: Admitting: Nurse Practitioner
